# Patient Record
Sex: Female | Born: 1950
Health system: Southern US, Community
[De-identification: ages and names within clinical notes are randomized; demographics above are authoritative.]

## PROBLEM LIST (undated history)

## (undated) DIAGNOSIS — Z90710 Acquired absence of both cervix and uterus: Secondary | ICD-10-CM

## (undated) DIAGNOSIS — E785 Hyperlipidemia, unspecified: Secondary | ICD-10-CM

## (undated) DIAGNOSIS — K219 Gastro-esophageal reflux disease without esophagitis: Secondary | ICD-10-CM

## (undated) DIAGNOSIS — I6529 Occlusion and stenosis of unspecified carotid artery: Secondary | ICD-10-CM

## (undated) DIAGNOSIS — I10 Essential (primary) hypertension: Secondary | ICD-10-CM

## (undated) DIAGNOSIS — Z853 Personal history of malignant neoplasm of breast: Secondary | ICD-10-CM

## (undated) DIAGNOSIS — Z860100 Personal history of colon polyps, unspecified: Secondary | ICD-10-CM

## (undated) DIAGNOSIS — I517 Cardiomegaly: Secondary | ICD-10-CM

## (undated) DIAGNOSIS — R252 Cramp and spasm: Secondary | ICD-10-CM

## (undated) DIAGNOSIS — I350 Nonrheumatic aortic (valve) stenosis: Secondary | ICD-10-CM

## (undated) DIAGNOSIS — I422 Other hypertrophic cardiomyopathy: Secondary | ICD-10-CM

## (undated) DIAGNOSIS — I340 Nonrheumatic mitral (valve) insufficiency: Secondary | ICD-10-CM

## (undated) DIAGNOSIS — C50919 Malignant neoplasm of unspecified site of unspecified female breast: Secondary | ICD-10-CM

## (undated) DIAGNOSIS — R0683 Snoring: Secondary | ICD-10-CM

## (undated) DIAGNOSIS — Z9221 Personal history of antineoplastic chemotherapy: Secondary | ICD-10-CM

## (undated) DIAGNOSIS — Z923 Personal history of irradiation: Secondary | ICD-10-CM

## (undated) DIAGNOSIS — R011 Cardiac murmur, unspecified: Secondary | ICD-10-CM

## (undated) HISTORY — PX: COLONOSCOPY W/ POLYPECTOMY: SHX1380

## (undated) HISTORY — DX: Nonrheumatic aortic (valve) stenosis: I35.0

## (undated) HISTORY — DX: Cramp and spasm: R25.2

## (undated) HISTORY — DX: Acquired absence of both cervix and uterus: Z90.710

## (undated) HISTORY — PX: ABDOMINAL HYSTERECTOMY: SHX81

## (undated) HISTORY — DX: Other hypertrophic cardiomyopathy: I42.2

## (undated) HISTORY — DX: Cardiomegaly: I51.7

## (undated) HISTORY — DX: Personal history of colon polyps, unspecified: Z86.0100

## (undated) HISTORY — DX: Cardiac murmur, unspecified: R01.1

## (undated) HISTORY — DX: Personal history of malignant neoplasm of breast: Z85.3

## (undated) HISTORY — DX: Essential (primary) hypertension: I10

## (undated) HISTORY — DX: Hyperlipidemia, unspecified: E78.5

## (undated) HISTORY — PX: TUBAL LIGATION: SHX77

## (undated) HISTORY — DX: Snoring: R06.83

## (undated) HISTORY — DX: Gastro-esophageal reflux disease without esophagitis: K21.9

## (undated) HISTORY — DX: Occlusion and stenosis of unspecified carotid artery: I65.29

## (undated) HISTORY — DX: Personal history of colonic polyps: Z86.010

## (undated) HISTORY — DX: Nonrheumatic mitral (valve) insufficiency: I34.0

---

## 1998-03-24 ENCOUNTER — Other Ambulatory Visit: Admission: RE | Admit: 1998-03-24 | Discharge: 1998-03-24 | Payer: Self-pay | Admitting: Obstetrics and Gynecology

## 2000-06-09 ENCOUNTER — Ambulatory Visit (HOSPITAL_COMMUNITY): Admission: RE | Admit: 2000-06-09 | Discharge: 2000-06-09 | Payer: Self-pay | Admitting: Gastroenterology

## 2001-03-27 ENCOUNTER — Ambulatory Visit (HOSPITAL_COMMUNITY): Admission: RE | Admit: 2001-03-27 | Discharge: 2001-03-27 | Payer: Self-pay | Admitting: Internal Medicine

## 2001-03-27 ENCOUNTER — Encounter: Payer: Self-pay | Admitting: Internal Medicine

## 2001-06-02 ENCOUNTER — Encounter: Admission: RE | Admit: 2001-06-02 | Discharge: 2001-06-02 | Payer: Self-pay | Admitting: Obstetrics and Gynecology

## 2001-06-02 ENCOUNTER — Encounter: Payer: Self-pay | Admitting: Obstetrics and Gynecology

## 2002-01-08 ENCOUNTER — Encounter: Admission: RE | Admit: 2002-01-08 | Discharge: 2002-04-08 | Payer: Self-pay | Admitting: Internal Medicine

## 2003-08-15 ENCOUNTER — Encounter (INDEPENDENT_AMBULATORY_CARE_PROVIDER_SITE_OTHER): Payer: Self-pay | Admitting: *Deleted

## 2003-08-15 ENCOUNTER — Ambulatory Visit (HOSPITAL_COMMUNITY): Admission: RE | Admit: 2003-08-15 | Discharge: 2003-08-15 | Payer: Self-pay | Admitting: Gastroenterology

## 2004-03-03 ENCOUNTER — Encounter: Admission: RE | Admit: 2004-03-03 | Discharge: 2004-03-03 | Payer: Self-pay | Admitting: Gastroenterology

## 2004-10-11 DIAGNOSIS — Z9221 Personal history of antineoplastic chemotherapy: Secondary | ICD-10-CM

## 2004-10-11 DIAGNOSIS — Z923 Personal history of irradiation: Secondary | ICD-10-CM

## 2004-10-11 DIAGNOSIS — Z853 Personal history of malignant neoplasm of breast: Secondary | ICD-10-CM

## 2004-10-11 HISTORY — DX: Personal history of irradiation: Z92.3

## 2004-10-11 HISTORY — DX: Personal history of antineoplastic chemotherapy: Z92.21

## 2004-10-11 HISTORY — PX: BREAST LUMPECTOMY: SHX2

## 2004-10-11 HISTORY — DX: Personal history of malignant neoplasm of breast: Z85.3

## 2004-11-17 ENCOUNTER — Ambulatory Visit: Payer: Self-pay | Admitting: Internal Medicine

## 2005-04-22 ENCOUNTER — Ambulatory Visit (HOSPITAL_COMMUNITY): Admission: RE | Admit: 2005-04-22 | Discharge: 2005-04-22 | Payer: Self-pay | Admitting: Internal Medicine

## 2005-05-03 ENCOUNTER — Encounter (INDEPENDENT_AMBULATORY_CARE_PROVIDER_SITE_OTHER): Payer: Self-pay | Admitting: *Deleted

## 2005-05-03 ENCOUNTER — Encounter: Admission: RE | Admit: 2005-05-03 | Discharge: 2005-05-03 | Payer: Self-pay | Admitting: Internal Medicine

## 2005-05-03 ENCOUNTER — Encounter (INDEPENDENT_AMBULATORY_CARE_PROVIDER_SITE_OTHER): Payer: Self-pay | Admitting: Diagnostic Radiology

## 2005-05-06 ENCOUNTER — Ambulatory Visit (HOSPITAL_COMMUNITY): Admission: RE | Admit: 2005-05-06 | Discharge: 2005-05-06 | Payer: Self-pay | Admitting: General Surgery

## 2005-05-25 ENCOUNTER — Encounter: Admission: RE | Admit: 2005-05-25 | Discharge: 2005-05-25 | Payer: Self-pay | Admitting: General Surgery

## 2005-05-31 ENCOUNTER — Ambulatory Visit (HOSPITAL_BASED_OUTPATIENT_CLINIC_OR_DEPARTMENT_OTHER): Admission: RE | Admit: 2005-05-31 | Discharge: 2005-05-31 | Payer: Self-pay | Admitting: General Surgery

## 2005-05-31 ENCOUNTER — Encounter (INDEPENDENT_AMBULATORY_CARE_PROVIDER_SITE_OTHER): Payer: Self-pay | Admitting: *Deleted

## 2005-05-31 ENCOUNTER — Encounter: Admission: RE | Admit: 2005-05-31 | Discharge: 2005-05-31 | Payer: Self-pay | Admitting: General Surgery

## 2005-05-31 ENCOUNTER — Ambulatory Visit (HOSPITAL_COMMUNITY): Admission: RE | Admit: 2005-05-31 | Discharge: 2005-05-31 | Payer: Self-pay | Admitting: General Surgery

## 2005-06-01 ENCOUNTER — Ambulatory Visit: Payer: Self-pay | Admitting: Oncology

## 2005-06-10 ENCOUNTER — Ambulatory Visit: Admission: RE | Admit: 2005-06-10 | Discharge: 2005-07-08 | Payer: Self-pay | Admitting: Radiation Oncology

## 2005-06-11 ENCOUNTER — Ambulatory Visit: Payer: Self-pay | Admitting: Internal Medicine

## 2005-06-21 ENCOUNTER — Ambulatory Visit (HOSPITAL_COMMUNITY): Admission: RE | Admit: 2005-06-21 | Discharge: 2005-06-21 | Payer: Self-pay | Admitting: Oncology

## 2005-06-21 ENCOUNTER — Ambulatory Visit (HOSPITAL_BASED_OUTPATIENT_CLINIC_OR_DEPARTMENT_OTHER): Admission: RE | Admit: 2005-06-21 | Discharge: 2005-06-21 | Payer: Self-pay | Admitting: General Surgery

## 2005-06-22 ENCOUNTER — Ambulatory Visit (HOSPITAL_COMMUNITY): Admission: RE | Admit: 2005-06-22 | Discharge: 2005-06-22 | Payer: Self-pay | Admitting: Oncology

## 2005-06-29 ENCOUNTER — Encounter (INDEPENDENT_AMBULATORY_CARE_PROVIDER_SITE_OTHER): Payer: Self-pay | Admitting: Cardiology

## 2005-06-29 ENCOUNTER — Ambulatory Visit: Admission: RE | Admit: 2005-06-29 | Discharge: 2005-06-29 | Payer: Self-pay | Admitting: Oncology

## 2005-07-22 ENCOUNTER — Ambulatory Visit: Payer: Self-pay | Admitting: Oncology

## 2005-08-24 ENCOUNTER — Ambulatory Visit: Admission: RE | Admit: 2005-08-24 | Discharge: 2005-11-19 | Payer: Self-pay | Admitting: Radiation Oncology

## 2005-09-06 ENCOUNTER — Encounter: Payer: Self-pay | Admitting: Radiation Oncology

## 2005-09-15 ENCOUNTER — Ambulatory Visit (HOSPITAL_BASED_OUTPATIENT_CLINIC_OR_DEPARTMENT_OTHER): Admission: RE | Admit: 2005-09-15 | Discharge: 2005-09-15 | Payer: Self-pay | Admitting: General Surgery

## 2005-11-01 ENCOUNTER — Ambulatory Visit: Payer: Self-pay | Admitting: Oncology

## 2006-01-03 ENCOUNTER — Ambulatory Visit: Payer: Self-pay | Admitting: Oncology

## 2006-04-06 ENCOUNTER — Ambulatory Visit: Payer: Self-pay | Admitting: Internal Medicine

## 2006-04-06 ENCOUNTER — Inpatient Hospital Stay (HOSPITAL_COMMUNITY): Admission: AD | Admit: 2006-04-06 | Discharge: 2006-04-07 | Payer: Self-pay | Admitting: Internal Medicine

## 2006-04-18 ENCOUNTER — Ambulatory Visit (HOSPITAL_COMMUNITY): Admission: RE | Admit: 2006-04-18 | Discharge: 2006-04-18 | Payer: Self-pay | Admitting: Gastroenterology

## 2006-04-25 ENCOUNTER — Encounter: Admission: RE | Admit: 2006-04-25 | Discharge: 2006-04-25 | Payer: Self-pay | Admitting: Internal Medicine

## 2006-05-13 ENCOUNTER — Emergency Department (HOSPITAL_COMMUNITY): Admission: EM | Admit: 2006-05-13 | Discharge: 2006-05-13 | Payer: Self-pay | Admitting: Family Medicine

## 2006-05-29 ENCOUNTER — Ambulatory Visit: Payer: Self-pay | Admitting: Oncology

## 2006-11-14 ENCOUNTER — Ambulatory Visit: Payer: Self-pay | Admitting: Internal Medicine

## 2006-11-14 ENCOUNTER — Ambulatory Visit: Payer: Self-pay | Admitting: Oncology

## 2006-11-17 LAB — CBC WITH DIFFERENTIAL/PLATELET
Eosinophils Absolute: 0.1 10*3/uL (ref 0.0–0.5)
HCT: 33.9 % — ABNORMAL LOW (ref 34.8–46.6)
LYMPH%: 22.5 % (ref 14.0–48.0)
MCHC: 34.4 g/dL (ref 32.0–36.0)
MCV: 89.1 fL (ref 81.0–101.0)
MONO%: 9.7 % (ref 0.0–13.0)
NEUT#: 3.9 10*3/uL (ref 1.5–6.5)
NEUT%: 65.4 % (ref 39.6–76.8)
Platelets: 266 10*3/uL (ref 145–400)
RBC: 3.81 10*6/uL (ref 3.70–5.32)

## 2006-11-17 LAB — COMPREHENSIVE METABOLIC PANEL
Alkaline Phosphatase: 49 U/L (ref 39–117)
Creatinine, Ser: 0.88 mg/dL (ref 0.40–1.20)
Glucose, Bld: 97 mg/dL (ref 70–99)
Sodium: 142 mEq/L (ref 135–145)
Total Bilirubin: 0.3 mg/dL (ref 0.3–1.2)
Total Protein: 6.8 g/dL (ref 6.0–8.3)

## 2006-11-17 LAB — T4, FREE: Free T4: 0.95 ng/dL (ref 0.89–1.80)

## 2006-11-17 LAB — TSH: TSH: 1.43 u[IU]/mL (ref 0.350–5.500)

## 2006-11-17 LAB — CANCER ANTIGEN 27.29: CA 27.29: 20 U/mL (ref 0–39)

## 2006-11-24 ENCOUNTER — Ambulatory Visit: Payer: Self-pay | Admitting: Internal Medicine

## 2007-01-04 ENCOUNTER — Encounter: Admission: RE | Admit: 2007-01-04 | Discharge: 2007-01-04 | Payer: Self-pay | Admitting: General Surgery

## 2007-02-22 ENCOUNTER — Ambulatory Visit (HOSPITAL_COMMUNITY): Admission: RE | Admit: 2007-02-22 | Discharge: 2007-02-22 | Payer: Self-pay | Admitting: Cardiology

## 2007-03-02 ENCOUNTER — Ambulatory Visit (HOSPITAL_COMMUNITY): Admission: RE | Admit: 2007-03-02 | Discharge: 2007-03-02 | Payer: Self-pay | Admitting: Cardiology

## 2007-03-07 ENCOUNTER — Ambulatory Visit (HOSPITAL_COMMUNITY): Admission: RE | Admit: 2007-03-07 | Discharge: 2007-03-07 | Payer: Self-pay | Admitting: Cardiology

## 2007-04-13 ENCOUNTER — Encounter: Payer: Self-pay | Admitting: Internal Medicine

## 2007-04-24 ENCOUNTER — Ambulatory Visit (HOSPITAL_COMMUNITY): Admission: RE | Admit: 2007-04-24 | Discharge: 2007-04-24 | Payer: Self-pay | Admitting: Family Medicine

## 2007-05-03 ENCOUNTER — Ambulatory Visit (HOSPITAL_COMMUNITY): Admission: RE | Admit: 2007-05-03 | Discharge: 2007-05-03 | Payer: Self-pay | Admitting: Oncology

## 2007-05-05 ENCOUNTER — Encounter: Admission: RE | Admit: 2007-05-05 | Discharge: 2007-05-05 | Payer: Self-pay | Admitting: Oncology

## 2007-06-06 ENCOUNTER — Ambulatory Visit: Payer: Self-pay | Admitting: Oncology

## 2007-06-08 ENCOUNTER — Encounter: Payer: Self-pay | Admitting: Internal Medicine

## 2007-06-08 LAB — CBC WITH DIFFERENTIAL/PLATELET
BASO%: 0.5 % (ref 0.0–2.0)
EOS%: 3.4 % (ref 0.0–7.0)
HGB: 11.3 g/dL — ABNORMAL LOW (ref 11.6–15.9)
MCH: 30.7 pg (ref 26.0–34.0)
MCHC: 34.2 g/dL (ref 32.0–36.0)
MCV: 89.9 fL (ref 81.0–101.0)
MONO%: 6.7 % (ref 0.0–13.0)
RBC: 3.67 10*6/uL — ABNORMAL LOW (ref 3.70–5.32)
RDW: 15.2 % — ABNORMAL HIGH (ref 11.3–14.5)
lymph#: 1.6 10*3/uL (ref 0.9–3.3)

## 2007-06-08 LAB — COMPREHENSIVE METABOLIC PANEL
ALT: 13 U/L (ref 0–35)
AST: 14 U/L (ref 0–37)
Albumin: 4 g/dL (ref 3.5–5.2)
Alkaline Phosphatase: 44 U/L (ref 39–117)
Calcium: 8.8 mg/dL (ref 8.4–10.5)
Chloride: 109 mEq/L (ref 96–112)
Potassium: 3.7 mEq/L (ref 3.5–5.3)
Sodium: 145 mEq/L (ref 135–145)

## 2007-06-08 LAB — CANCER ANTIGEN 27.29: CA 27.29: 28 U/mL (ref 0–39)

## 2007-11-01 ENCOUNTER — Encounter: Payer: Self-pay | Admitting: Internal Medicine

## 2007-11-01 ENCOUNTER — Ambulatory Visit (HOSPITAL_COMMUNITY): Admission: RE | Admit: 2007-11-01 | Discharge: 2007-11-01 | Payer: Self-pay | Admitting: Gastroenterology

## 2007-11-16 ENCOUNTER — Telehealth (INDEPENDENT_AMBULATORY_CARE_PROVIDER_SITE_OTHER): Payer: Self-pay | Admitting: *Deleted

## 2007-11-28 ENCOUNTER — Ambulatory Visit: Payer: Self-pay | Admitting: Oncology

## 2007-11-29 LAB — CBC WITH DIFFERENTIAL/PLATELET
EOS%: 3.9 % (ref 0.0–7.0)
MCH: 30.5 pg (ref 26.0–34.0)
MCHC: 33.4 g/dL (ref 32.0–36.0)
MCV: 91.2 fL (ref 81.0–101.0)
MONO%: 8.8 % (ref 0.0–13.0)
RBC: 4 10*6/uL (ref 3.70–5.32)
RDW: UNDETERMINED % (ref 11.3–14.5)

## 2007-11-29 LAB — COMPREHENSIVE METABOLIC PANEL
AST: 14 U/L (ref 0–37)
Albumin: 3.9 g/dL (ref 3.5–5.2)
Alkaline Phosphatase: 55 U/L (ref 39–117)
Potassium: 4.6 mEq/L (ref 3.5–5.3)
Sodium: 143 mEq/L (ref 135–145)
Total Protein: 7 g/dL (ref 6.0–8.3)

## 2007-12-01 ENCOUNTER — Emergency Department (HOSPITAL_COMMUNITY): Admission: EM | Admit: 2007-12-01 | Discharge: 2007-12-01 | Payer: Self-pay | Admitting: Emergency Medicine

## 2007-12-02 ENCOUNTER — Emergency Department (HOSPITAL_COMMUNITY): Admission: EM | Admit: 2007-12-02 | Discharge: 2007-12-02 | Payer: Self-pay | Admitting: Family Medicine

## 2007-12-07 ENCOUNTER — Encounter: Payer: Self-pay | Admitting: Internal Medicine

## 2008-02-16 ENCOUNTER — Telehealth (INDEPENDENT_AMBULATORY_CARE_PROVIDER_SITE_OTHER): Payer: Self-pay | Admitting: *Deleted

## 2008-03-05 ENCOUNTER — Telehealth (INDEPENDENT_AMBULATORY_CARE_PROVIDER_SITE_OTHER): Payer: Self-pay | Admitting: *Deleted

## 2008-03-19 ENCOUNTER — Ambulatory Visit: Payer: Self-pay | Admitting: Internal Medicine

## 2008-03-19 DIAGNOSIS — K219 Gastro-esophageal reflux disease without esophagitis: Secondary | ICD-10-CM | POA: Insufficient documentation

## 2008-03-19 DIAGNOSIS — Z853 Personal history of malignant neoplasm of breast: Secondary | ICD-10-CM | POA: Insufficient documentation

## 2008-03-19 DIAGNOSIS — R5383 Other fatigue: Secondary | ICD-10-CM

## 2008-03-19 DIAGNOSIS — R5381 Other malaise: Secondary | ICD-10-CM | POA: Insufficient documentation

## 2008-03-20 ENCOUNTER — Telehealth (INDEPENDENT_AMBULATORY_CARE_PROVIDER_SITE_OTHER): Payer: Self-pay | Admitting: *Deleted

## 2008-03-21 ENCOUNTER — Encounter (INDEPENDENT_AMBULATORY_CARE_PROVIDER_SITE_OTHER): Payer: Self-pay | Admitting: *Deleted

## 2008-04-18 ENCOUNTER — Encounter: Payer: Self-pay | Admitting: Internal Medicine

## 2008-04-29 ENCOUNTER — Telehealth (INDEPENDENT_AMBULATORY_CARE_PROVIDER_SITE_OTHER): Payer: Self-pay | Admitting: *Deleted

## 2008-05-06 ENCOUNTER — Encounter: Admission: RE | Admit: 2008-05-06 | Discharge: 2008-05-06 | Payer: Self-pay | Admitting: General Surgery

## 2008-05-08 ENCOUNTER — Encounter: Payer: Self-pay | Admitting: Internal Medicine

## 2008-05-10 ENCOUNTER — Telehealth (INDEPENDENT_AMBULATORY_CARE_PROVIDER_SITE_OTHER): Payer: Self-pay | Admitting: *Deleted

## 2008-05-13 ENCOUNTER — Encounter: Payer: Self-pay | Admitting: Internal Medicine

## 2008-05-16 ENCOUNTER — Ambulatory Visit: Payer: Self-pay | Admitting: Internal Medicine

## 2008-05-25 LAB — CONVERTED CEMR LAB
Albumin: 3.4 g/dL — ABNORMAL LOW (ref 3.5–5.2)
BUN: 10 mg/dL (ref 6–23)
Basophils Relative: 0.7 % (ref 0.0–3.0)
Creatinine, Ser: 0.8 mg/dL (ref 0.4–1.2)
Eosinophils Absolute: 0.1 10*3/uL (ref 0.0–0.7)
Eosinophils Relative: 2.4 % (ref 0.0–5.0)
GFR calc Af Amer: 96 mL/min
GFR calc non Af Amer: 79 mL/min
HCT: 34.6 % — ABNORMAL LOW (ref 36.0–46.0)
HDL: 53.6 mg/dL (ref >39.0)
MCV: 90.4 fL (ref 78.0–100.0)
Monocytes Absolute: 0.4 10*3/uL (ref 0.1–1.0)
RBC: 3.83 M/uL — ABNORMAL LOW (ref 3.87–5.11)
TSH: 1.12 microintl units/mL (ref 0.35–5.50)
WBC: 4.8 10*3/uL (ref 4.5–10.5)

## 2008-05-26 ENCOUNTER — Ambulatory Visit: Payer: Self-pay | Admitting: Oncology

## 2008-05-27 ENCOUNTER — Encounter (INDEPENDENT_AMBULATORY_CARE_PROVIDER_SITE_OTHER): Payer: Self-pay | Admitting: *Deleted

## 2008-05-29 ENCOUNTER — Telehealth (INDEPENDENT_AMBULATORY_CARE_PROVIDER_SITE_OTHER): Payer: Self-pay | Admitting: *Deleted

## 2008-05-29 LAB — CBC WITH DIFFERENTIAL/PLATELET
Basophils Absolute: 0 10*3/uL (ref 0.0–0.1)
EOS%: 2.3 % (ref 0.0–7.0)
HGB: 12 g/dL (ref 11.6–15.9)
MCH: 30.4 pg (ref 26.0–34.0)
MCV: 90.1 fL (ref 81.0–101.0)
MONO%: 8.8 % (ref 0.0–13.0)
RBC: 3.95 10*6/uL (ref 3.70–5.32)
RDW: 14.9 % — ABNORMAL HIGH (ref 11.3–14.5)

## 2008-05-29 LAB — COMPREHENSIVE METABOLIC PANEL
Alkaline Phosphatase: 49 U/L (ref 39–117)
Creatinine, Ser: 0.86 mg/dL (ref 0.40–1.20)
Glucose, Bld: 87 mg/dL (ref 70–99)
Sodium: 141 mEq/L (ref 135–145)
Total Bilirubin: 0.4 mg/dL (ref 0.3–1.2)
Total Protein: 7.1 g/dL (ref 6.0–8.3)

## 2008-06-05 ENCOUNTER — Encounter: Payer: Self-pay | Admitting: Internal Medicine

## 2008-06-06 ENCOUNTER — Telehealth: Payer: Self-pay | Admitting: Internal Medicine

## 2008-06-10 ENCOUNTER — Ambulatory Visit: Payer: Self-pay | Admitting: Internal Medicine

## 2008-06-14 ENCOUNTER — Telehealth: Payer: Self-pay | Admitting: Internal Medicine

## 2008-06-18 LAB — CONVERTED CEMR LAB
Hemoglobin: 11.5 g/dL — ABNORMAL LOW (ref 12.0–15.0)
Iron: 69 ug/dL (ref 42–145)
Lymphocytes Relative: 29.9 % (ref 12.0–46.0)
Monocytes Relative: 9.9 % (ref 3.0–12.0)
Neutro Abs: 2.9 10*3/uL (ref 1.4–7.7)
RBC: 3.74 M/uL — ABNORMAL LOW (ref 3.87–5.11)
RDW: 14.1 % (ref 11.5–14.6)
Saturation Ratios: 19.2 % — ABNORMAL LOW (ref 20.0–50.0)
Transferrin: 257.3 mg/dL (ref >=212.0)
Vitamin B-12: 402 pg/mL (ref 211–911)

## 2008-07-22 ENCOUNTER — Ambulatory Visit: Payer: Self-pay | Admitting: Oncology

## 2008-10-16 ENCOUNTER — Telehealth (INDEPENDENT_AMBULATORY_CARE_PROVIDER_SITE_OTHER): Payer: Self-pay | Admitting: *Deleted

## 2008-11-05 ENCOUNTER — Ambulatory Visit: Payer: Self-pay | Admitting: Internal Medicine

## 2008-11-25 ENCOUNTER — Ambulatory Visit: Payer: Self-pay | Admitting: Oncology

## 2008-11-27 ENCOUNTER — Encounter: Payer: Self-pay | Admitting: Internal Medicine

## 2008-11-27 LAB — COMPREHENSIVE METABOLIC PANEL
ALT: 14 U/L (ref 0–35)
CO2: 28 mEq/L (ref 19–32)
Calcium: 8.7 mg/dL (ref 8.4–10.5)
Chloride: 108 mEq/L (ref 96–112)
Potassium: 3.5 mEq/L (ref 3.5–5.3)
Sodium: 143 mEq/L (ref 135–145)
Total Protein: 6.6 g/dL (ref 6.0–8.3)

## 2008-11-27 LAB — CBC WITH DIFFERENTIAL/PLATELET
BASO%: 0.4 % (ref 0.0–2.0)
Basophils Absolute: 0 10*3/uL (ref 0.0–0.1)
EOS%: 2.3 % (ref 0.0–7.0)
MCH: 30.4 pg (ref 26.0–34.0)
MCHC: 33.5 g/dL (ref 32.0–36.0)
MCV: 90.8 fL (ref 81.0–101.0)
MONO%: 8.8 % (ref 0.0–13.0)
RBC: 3.76 10*6/uL (ref 3.70–5.32)
RDW: 14.9 % — ABNORMAL HIGH (ref 11.3–14.5)
lymph#: 1.6 10*3/uL (ref 0.9–3.3)

## 2008-11-27 LAB — CANCER ANTIGEN 27.29: CA 27.29: 23 U/mL (ref 0–39)

## 2008-12-04 ENCOUNTER — Encounter: Payer: Self-pay | Admitting: Internal Medicine

## 2008-12-12 ENCOUNTER — Ambulatory Visit (HOSPITAL_COMMUNITY): Admission: RE | Admit: 2008-12-12 | Discharge: 2008-12-12 | Payer: Self-pay | Admitting: Gastroenterology

## 2008-12-12 ENCOUNTER — Encounter: Payer: Self-pay | Admitting: Internal Medicine

## 2008-12-12 ENCOUNTER — Encounter (INDEPENDENT_AMBULATORY_CARE_PROVIDER_SITE_OTHER): Payer: Self-pay | Admitting: Gastroenterology

## 2008-12-20 ENCOUNTER — Telehealth (INDEPENDENT_AMBULATORY_CARE_PROVIDER_SITE_OTHER): Payer: Self-pay | Admitting: *Deleted

## 2008-12-23 LAB — FERRITIN: Ferritin: 36 ng/mL (ref 10–291)

## 2008-12-23 LAB — VITAMIN B12: Vitamin B-12: 388 pg/mL (ref 211–911)

## 2008-12-23 LAB — FOLATE: Folate: 17.4 ng/mL

## 2008-12-23 LAB — RESEARCH LABS

## 2009-02-13 ENCOUNTER — Ambulatory Visit (HOSPITAL_COMMUNITY): Admission: RE | Admit: 2009-02-13 | Discharge: 2009-02-13 | Payer: Self-pay | Admitting: Obstetrics and Gynecology

## 2009-03-03 ENCOUNTER — Encounter: Payer: Self-pay | Admitting: Oncology

## 2009-03-03 ENCOUNTER — Ambulatory Visit: Payer: Self-pay | Admitting: Vascular Surgery

## 2009-03-03 ENCOUNTER — Ambulatory Visit: Admission: RE | Admit: 2009-03-03 | Discharge: 2009-03-03 | Payer: Self-pay | Admitting: Oncology

## 2009-03-18 ENCOUNTER — Telehealth: Payer: Self-pay | Admitting: Internal Medicine

## 2009-05-07 ENCOUNTER — Encounter: Admission: RE | Admit: 2009-05-07 | Discharge: 2009-05-07 | Payer: Self-pay | Admitting: Oncology

## 2009-05-26 ENCOUNTER — Ambulatory Visit: Payer: Self-pay | Admitting: Oncology

## 2009-05-29 ENCOUNTER — Encounter: Payer: Self-pay | Admitting: Internal Medicine

## 2009-05-29 LAB — CBC WITH DIFFERENTIAL/PLATELET
Basophils Absolute: 0 10*3/uL (ref 0.0–0.1)
Eosinophils Absolute: 0.3 10*3/uL (ref 0.0–0.5)
HCT: 35.7 % (ref 34.8–46.6)
HGB: 12 g/dL (ref 11.6–15.9)
MCH: 30.4 pg (ref 25.1–34.0)
MONO#: 0.5 10*3/uL (ref 0.1–0.9)
NEUT#: 5.3 10*3/uL (ref 1.5–6.5)
NEUT%: 71.8 % (ref 38.4–76.8)
WBC: 7.4 10*3/uL (ref 3.9–10.3)
lymph#: 1.3 10*3/uL (ref 0.9–3.3)

## 2009-05-30 LAB — CANCER ANTIGEN 27.29: CA 27.29: 33 U/mL (ref 0–39)

## 2009-05-30 LAB — COMPREHENSIVE METABOLIC PANEL
BUN: 15 mg/dL (ref 6–23)
CO2: 22 mEq/L (ref 19–32)
Calcium: 9.1 mg/dL (ref 8.4–10.5)
Chloride: 104 mEq/L (ref 96–112)
Creatinine, Ser: 0.96 mg/dL (ref 0.40–1.20)
Glucose, Bld: 88 mg/dL (ref 70–99)
Total Bilirubin: 0.3 mg/dL (ref 0.3–1.2)

## 2009-05-30 LAB — LACTATE DEHYDROGENASE: LDH: 187 U/L (ref 94–250)

## 2009-05-30 LAB — VITAMIN D 25 HYDROXY (VIT D DEFICIENCY, FRACTURES): Vit D, 25-Hydroxy: 15 ng/mL — ABNORMAL LOW (ref 30–89)

## 2009-06-04 ENCOUNTER — Encounter: Payer: Self-pay | Admitting: Internal Medicine

## 2009-06-04 LAB — URINALYSIS, MICROSCOPIC - CHCC
Bilirubin (Urine): NEGATIVE
Blood: NEGATIVE
Glucose: NEGATIVE g/dL
Nitrite: NEGATIVE

## 2009-06-06 ENCOUNTER — Telehealth (INDEPENDENT_AMBULATORY_CARE_PROVIDER_SITE_OTHER): Payer: Self-pay | Admitting: *Deleted

## 2009-06-17 ENCOUNTER — Ambulatory Visit: Payer: Self-pay | Admitting: Internal Medicine

## 2009-06-17 DIAGNOSIS — E559 Vitamin D deficiency, unspecified: Secondary | ICD-10-CM | POA: Insufficient documentation

## 2009-06-18 ENCOUNTER — Encounter: Payer: Self-pay | Admitting: Internal Medicine

## 2009-06-20 ENCOUNTER — Encounter (INDEPENDENT_AMBULATORY_CARE_PROVIDER_SITE_OTHER): Payer: Self-pay | Admitting: *Deleted

## 2009-06-22 LAB — CONVERTED CEMR LAB
HDL: 53.4 mg/dL (ref >39.00)
Hgb A1c MFr Bld: 6.2 % (ref 4.6–6.5)

## 2009-06-23 ENCOUNTER — Encounter (INDEPENDENT_AMBULATORY_CARE_PROVIDER_SITE_OTHER): Payer: Self-pay | Admitting: *Deleted

## 2009-06-27 ENCOUNTER — Telehealth: Payer: Self-pay | Admitting: Internal Medicine

## 2009-10-16 ENCOUNTER — Telehealth (INDEPENDENT_AMBULATORY_CARE_PROVIDER_SITE_OTHER): Payer: Self-pay | Admitting: *Deleted

## 2009-10-27 ENCOUNTER — Telehealth (INDEPENDENT_AMBULATORY_CARE_PROVIDER_SITE_OTHER): Payer: Self-pay | Admitting: *Deleted

## 2009-11-03 ENCOUNTER — Telehealth (INDEPENDENT_AMBULATORY_CARE_PROVIDER_SITE_OTHER): Payer: Self-pay | Admitting: *Deleted

## 2010-01-05 ENCOUNTER — Ambulatory Visit: Payer: Self-pay | Admitting: Internal Medicine

## 2010-01-05 DIAGNOSIS — R9431 Abnormal electrocardiogram [ECG] [EKG]: Secondary | ICD-10-CM | POA: Insufficient documentation

## 2010-01-05 DIAGNOSIS — M21619 Bunion of unspecified foot: Secondary | ICD-10-CM | POA: Insufficient documentation

## 2010-01-07 ENCOUNTER — Ambulatory Visit: Payer: Self-pay | Admitting: Internal Medicine

## 2010-01-07 DIAGNOSIS — Z860100 Personal history of colon polyps, unspecified: Secondary | ICD-10-CM | POA: Insufficient documentation

## 2010-01-07 DIAGNOSIS — I10 Essential (primary) hypertension: Secondary | ICD-10-CM

## 2010-01-07 DIAGNOSIS — E785 Hyperlipidemia, unspecified: Secondary | ICD-10-CM | POA: Insufficient documentation

## 2010-01-07 HISTORY — DX: Essential (primary) hypertension: I10

## 2010-01-08 ENCOUNTER — Telehealth (INDEPENDENT_AMBULATORY_CARE_PROVIDER_SITE_OTHER): Payer: Self-pay | Admitting: *Deleted

## 2010-01-08 LAB — CONVERTED CEMR LAB
ALT: 17 units/L (ref 0–35)
Albumin: 3.6 g/dL (ref 3.5–5.2)
Basophils Relative: 0.6 % (ref 0.0–3.0)
CO2: 31 meq/L (ref 19–32)
Chloride: 107 meq/L (ref 96–112)
Cholesterol: 124 mg/dL (ref 0–200)
Creatinine, Ser: 0.9 mg/dL (ref 0.4–1.2)
Eosinophils Absolute: 0.3 10*3/uL (ref 0.0–0.7)
Eosinophils Relative: 5.5 % — ABNORMAL HIGH (ref 0.0–5.0)
HCT: 36 % (ref 36.0–46.0)
Hemoglobin: 11.9 g/dL — ABNORMAL LOW (ref 12.0–15.0)
Hgb A1c MFr Bld: 6.1 % (ref 4.6–6.5)
Ketones, ur: NEGATIVE mg/dL
Leukocytes, UA: NEGATIVE
Lymphs Abs: 1.6 10*3/uL (ref 0.7–4.0)
MCHC: 32.9 g/dL (ref 30.0–36.0)
MCV: 93.2 fL (ref 78.0–100.0)
Monocytes Absolute: 0.5 10*3/uL (ref 0.1–1.0)
Neutro Abs: 2.7 10*3/uL (ref 1.4–7.7)
Potassium: 3.6 meq/L (ref 3.5–5.1)
RBC: 3.86 M/uL — ABNORMAL LOW (ref 3.87–5.11)
Sodium: 144 meq/L (ref 135–145)
Specific Gravity, Urine: >=1.03 (ref 1.000–1.030)
Total Protein: 6.6 g/dL (ref 6.0–8.3)
Triglycerides: 57 mg/dL (ref 0.0–149.0)
Urine Glucose: NEGATIVE mg/dL
pH: 5 (ref 5.0–8.0)

## 2010-01-12 ENCOUNTER — Encounter (HOSPITAL_COMMUNITY): Admission: RE | Admit: 2010-01-12 | Discharge: 2010-03-11 | Payer: Self-pay | Admitting: Internal Medicine

## 2010-01-12 ENCOUNTER — Encounter: Payer: Self-pay | Admitting: Cardiology

## 2010-01-12 ENCOUNTER — Ambulatory Visit: Payer: Self-pay

## 2010-01-12 ENCOUNTER — Ambulatory Visit: Payer: Self-pay | Admitting: Internal Medicine

## 2010-01-12 ENCOUNTER — Telehealth: Payer: Self-pay | Admitting: Internal Medicine

## 2010-01-13 ENCOUNTER — Telehealth (INDEPENDENT_AMBULATORY_CARE_PROVIDER_SITE_OTHER): Payer: Self-pay | Admitting: *Deleted

## 2010-01-13 ENCOUNTER — Telehealth: Payer: Self-pay | Admitting: Internal Medicine

## 2010-01-13 ENCOUNTER — Ambulatory Visit (HOSPITAL_BASED_OUTPATIENT_CLINIC_OR_DEPARTMENT_OTHER): Admission: RE | Admit: 2010-01-13 | Discharge: 2010-01-13 | Payer: Self-pay | Admitting: Podiatry

## 2010-03-03 ENCOUNTER — Telehealth (INDEPENDENT_AMBULATORY_CARE_PROVIDER_SITE_OTHER): Payer: Self-pay | Admitting: *Deleted

## 2010-05-22 ENCOUNTER — Encounter: Admission: RE | Admit: 2010-05-22 | Discharge: 2010-05-22 | Payer: Self-pay | Admitting: Oncology

## 2010-05-28 ENCOUNTER — Ambulatory Visit: Payer: Self-pay | Admitting: Oncology

## 2010-06-01 LAB — COMPREHENSIVE METABOLIC PANEL
ALT: 19 U/L (ref 0–35)
AST: 18 U/L (ref 0–37)
Albumin: 4.1 g/dL (ref 3.5–5.2)
CO2: 29 mEq/L (ref 19–32)
Calcium: 9.5 mg/dL (ref 8.4–10.5)
Chloride: 103 mEq/L (ref 96–112)
Creatinine, Ser: 1.02 mg/dL (ref 0.40–1.20)
Potassium: 3.5 mEq/L (ref 3.5–5.3)
Sodium: 141 mEq/L (ref 135–145)
Total Protein: 6.6 g/dL (ref 6.0–8.3)

## 2010-06-01 LAB — CBC WITH DIFFERENTIAL/PLATELET
BASO%: 0.4 % (ref 0.0–2.0)
EOS%: 1.6 % (ref 0.0–7.0)
HCT: 37.2 % (ref 34.8–46.6)
MCH: 30.3 pg (ref 25.1–34.0)
MCHC: 33.5 g/dL (ref 31.5–36.0)
MONO#: 0.5 10*3/uL (ref 0.1–0.9)
NEUT%: 62.6 % (ref 38.4–76.8)
RBC: 4.11 10*6/uL (ref 3.70–5.45)
RDW: 15.5 % — ABNORMAL HIGH (ref 11.2–14.5)
WBC: 5.8 10*3/uL (ref 3.9–10.3)
lymph#: 1.5 10*3/uL (ref 0.9–3.3)

## 2010-06-08 ENCOUNTER — Ambulatory Visit (HOSPITAL_COMMUNITY): Admission: RE | Admit: 2010-06-08 | Discharge: 2010-06-08 | Payer: Self-pay | Admitting: Oncology

## 2010-06-08 ENCOUNTER — Encounter: Payer: Self-pay | Admitting: Internal Medicine

## 2010-09-08 ENCOUNTER — Telehealth (INDEPENDENT_AMBULATORY_CARE_PROVIDER_SITE_OTHER): Payer: Self-pay | Admitting: *Deleted

## 2010-10-13 ENCOUNTER — Telehealth (INDEPENDENT_AMBULATORY_CARE_PROVIDER_SITE_OTHER): Payer: Self-pay | Admitting: *Deleted

## 2010-10-16 ENCOUNTER — Ambulatory Visit: Payer: Self-pay | Admitting: Oncology

## 2010-10-23 ENCOUNTER — Other Ambulatory Visit: Payer: Self-pay | Admitting: Internal Medicine

## 2010-10-23 ENCOUNTER — Ambulatory Visit
Admission: RE | Admit: 2010-10-23 | Discharge: 2010-10-23 | Payer: Self-pay | Source: Home / Self Care | Attending: Internal Medicine | Admitting: Internal Medicine

## 2010-10-23 ENCOUNTER — Encounter: Payer: Self-pay | Admitting: Internal Medicine

## 2010-10-23 DIAGNOSIS — R109 Unspecified abdominal pain: Secondary | ICD-10-CM | POA: Insufficient documentation

## 2010-10-23 DIAGNOSIS — M545 Low back pain, unspecified: Secondary | ICD-10-CM | POA: Insufficient documentation

## 2010-10-23 LAB — CBC WITH DIFFERENTIAL/PLATELET
Basophils Absolute: 0 10*3/uL (ref 0.0–0.1)
Basophils Relative: 0.1 % (ref 0.0–3.0)
Eosinophils Absolute: 0 10*3/uL (ref 0.0–0.7)
Eosinophils Relative: 0.2 % (ref 0.0–5.0)
HCT: 39.2 % (ref 36.0–46.0)
Hemoglobin: 13.1 g/dL (ref 12.0–15.0)
Lymphocytes Relative: 15.8 % (ref 12.0–46.0)
Lymphs Abs: 0.9 10*3/uL (ref 0.7–4.0)
MCHC: 33.5 g/dL (ref 30.0–36.0)
MCV: 89.6 fl (ref 78.0–100.0)
Monocytes Absolute: 0.4 10*3/uL (ref 0.1–1.0)
Monocytes Relative: 7.8 % (ref 3.0–12.0)
Neutro Abs: 4.2 10*3/uL (ref 1.4–7.7)
Neutrophils Relative %: 76.1 % (ref 43.0–77.0)
Platelets: 253 10*3/uL (ref 150.0–400.0)
RBC: 4.37 Mil/uL (ref 3.87–5.11)
RDW: 15.5 % — ABNORMAL HIGH (ref 11.5–14.6)
WBC: 5.6 10*3/uL (ref 4.5–10.5)

## 2010-10-23 LAB — CONVERTED CEMR LAB
Bilirubin Urine: NEGATIVE
Glucose, Urine, Semiquant: NEGATIVE
Protein, U semiquant: NEGATIVE
Specific Gravity, Urine: <1.005
WBC Urine, dipstick: NEGATIVE
pH: 6.5

## 2010-10-23 LAB — HEPATIC FUNCTION PANEL
ALT: 24 U/L (ref 0–35)
AST: 25 U/L (ref 0–37)
Albumin: 3.7 g/dL (ref 3.5–5.2)
Alkaline Phosphatase: 57 U/L (ref 39–117)
Bilirubin, Direct: 0.1 mg/dL (ref 0.0–0.3)
Total Bilirubin: 0.5 mg/dL (ref 0.3–1.2)
Total Protein: 7.1 g/dL (ref 6.0–8.3)

## 2010-10-23 LAB — AMYLASE: Amylase: 77 U/L (ref 27–131)

## 2010-10-23 LAB — LIPASE: Lipase: 25 U/L (ref 11.0–59.0)

## 2010-10-26 ENCOUNTER — Telehealth: Payer: Self-pay | Admitting: Internal Medicine

## 2010-10-27 ENCOUNTER — Encounter: Payer: Self-pay | Admitting: Internal Medicine

## 2010-10-27 LAB — CBC WITH DIFFERENTIAL/PLATELET
BASO%: 0.4 % (ref 0.0–2.0)
Basophils Absolute: 0 10*3/uL (ref 0.0–0.1)
EOS%: 0.5 % (ref 0.0–7.0)
Eosinophils Absolute: 0 10*3/uL (ref 0.0–0.5)
HCT: 38.5 % (ref 34.8–46.6)
HGB: 12.7 g/dL (ref 11.6–15.9)
LYMPH%: 23.1 % (ref 14.0–49.7)
MCH: 29.3 pg (ref 25.1–34.0)
MCHC: 32.9 g/dL (ref 31.5–36.0)
MCV: 89.1 fL (ref 79.5–101.0)
MONO#: 0.3 10*3/uL (ref 0.1–0.9)
MONO%: 6.5 % (ref 0.0–14.0)
NEUT#: 3.6 10*3/uL (ref 1.5–6.5)
NEUT%: 69.5 % (ref 38.4–76.8)
Platelets: 270 10*3/uL (ref 145–400)
RBC: 4.33 10*6/uL (ref 3.70–5.45)
RDW: 15.1 % — ABNORMAL HIGH (ref 11.2–14.5)
WBC: 5.2 10*3/uL (ref 3.9–10.3)
lymph#: 1.2 10*3/uL (ref 0.9–3.3)

## 2010-10-27 LAB — CANCER ANTIGEN 27.29: CA 27.29: 29 U/mL (ref 0–39)

## 2010-10-27 LAB — COMPREHENSIVE METABOLIC PANEL
ALT: 28 U/L (ref 0–35)
AST: 31 U/L (ref 0–37)
Albumin: 4 g/dL (ref 3.5–5.2)
Alkaline Phosphatase: 49 U/L (ref 39–117)
BUN: 13 mg/dL (ref 6–23)
CO2: 31 mEq/L (ref 19–32)
Calcium: 9.1 mg/dL (ref 8.4–10.5)
Chloride: 103 mEq/L (ref 96–112)
Creatinine, Ser: 0.93 mg/dL (ref 0.40–1.20)
Glucose, Bld: 117 mg/dL — ABNORMAL HIGH (ref 70–99)
Potassium: 3.1 mEq/L — ABNORMAL LOW (ref 3.5–5.3)
Sodium: 143 mEq/L (ref 135–145)
Total Bilirubin: 0.3 mg/dL (ref 0.3–1.2)
Total Protein: 6.9 g/dL (ref 6.0–8.3)

## 2010-11-01 ENCOUNTER — Encounter: Payer: Self-pay | Admitting: Gastroenterology

## 2010-11-01 ENCOUNTER — Encounter: Payer: Self-pay | Admitting: Oncology

## 2010-11-01 ENCOUNTER — Encounter: Payer: Self-pay | Admitting: Internal Medicine

## 2010-11-10 NOTE — Progress Notes (Signed)
  Phone Note Call from Patient Call back at Home Phone [phone]   Caller: Patient Summary of Call: Patient called to say her surgery is suppose to be today and she needs to know if she is cleared or not.  I informed patient that the clearance was left in the hands of the Cardiologist-Per Dr.Hopper. I spoke with someone in cardiology yesterday and gave them the fax and phone number for the surgery center and was told they would contact them. Patient was given the number to cardiology./[facility]  January 13, 2010 8:50 AM

## 2010-11-10 NOTE — Progress Notes (Signed)
  Pt dropped of FMLA papers for completion,sent to [employer]. Laura Poole  September 08, 2010 2:02 PM

## 2010-11-10 NOTE — Progress Notes (Signed)
Summary: Vit D Concerns   Phone Note Outgoing Call Call back at Home Phone [phone]   Call placed by: Shonna Chock,  Mar 03, 2010 9:58 AM Call placed to: Patient Summary of Call: I called patient to discuss her dose of Vit D, patient said she is taking plain Vit D 1000 international units daily.  I informed patient per Dr.Hopper she is suppose to be on D3 2000 international units daily. I will send in a new RX for the correct dose Initial call taken by: Shonna Chock,  Mar 03, 2010 10:15 AM    Prescriptions: VITAMIN D3 2000 UNIT CAPS (CHOLECALCIFEROL) 1 by mouth once daily  #30 x 11   Entered by:   Shonna Chock   Authorized by:   Marga Melnick MD   Signed by:   Shonna Chock on 03/03/2010   Method used:   Electronically to        [facility]* (retail)       [address]       [address] [place] [address]ing       [place], [place]  [address]       Ph: [phone]       Fax: [phone]   RxID:   [id number]

## 2010-11-10 NOTE — Progress Notes (Signed)
Summary: Vit D Concerns  Phone Note From Pharmacy   Caller: [facility] Summary of Call: Message left on VM Silvio Pate from the pharmacy. Vit D rx was written incorrectly Vit D 50,000 ONLY comes as D2 not D3. Would the Dr.Like to RX D2?    Per Dr.hopper have patient take D3 OTC 2000iu daily and recheck in 6 months.  I called [facility] and had her void RX for Vit D3 50,000. I called the patient and left message on her VM informing her of the change in Vit D./[facility]rae [facility]  November 03, 2009 5:14 PM       New/Updated Medications: VITAMIN D3 2000 UNIT CAPS (CHOLECALCIFEROL) 1 by mouth once daily

## 2010-11-10 NOTE — Assessment & Plan Note (Signed)
Summary: Cardiology Nuclear Study  Nuclear Med Background Indications for Stress Test: Evaluation for Ischemia, Surgical Clearance, Abnormal EKG  Indications Comments: Pending foot surgery  History: Echo, History of Chemo, Myocardial Perfusion Study, MVP  History Comments: '08 Echo: NL LVF, mod. LVH, MVP '08 MPS: EF=69%, anterior scar, peri-infarct ischemia  Symptoms: Chest Pressure, Chest Tightness, Palpitations    Nuclear Pre-Procedure Cardiac Risk Factors: Family History - CAD, Hypertension, Lipids Caffeine/Decaff Intake: None NPO After: 9:00 PM Lungs: clear IV 0.9% NS with Angio Cath: 22g     IV Site: (R) Wrist IV Started by: Irean Hong RN Chest Size (in) 40     Cup Size DD     Height (in): 67.75 Weight (lb): 165 BMI: 25.37 Tech Comments: The patient to have feet surgery tomorrow, and unable to walk treadmill per patient, changed to Lexiscan. Patsy Edwards,RN.  Nuclear Med Study 1 or 2 day study:  1 day     Stress Test Type:  Eugenie Birks Reading MD:  Dietrich Pates, MD     Referring MD:  Marga Melnick Resting Radionuclide:  Technetium 24m Tetrofosmin     Resting Radionuclide Dose:  11 mCi  Stress Radionuclide:  Technetium 66m Tetrofosmin     Stress Radionuclide Dose:  33 mCi   Stress Protocol      Predicted Max HR:  164 bpm   Lexiscan: 0.4 mg   Stress Test Technologist:  Milana Na EMT-P     Nuclear Technologist:  Burna Mortimer Deal RT-N  Rest Procedure  Myocardial perfusion imaging was performed at rest 45 minutes following the intravenous administration of Myoview Technetium 35m Tetrofosmin.  Stress Procedure  The patient received IV Lexiscan 0.4 mg over 15-seconds.  Myoview injected at 30-seconds.  There were no significant changes, sob, chest tightness(in late recovery), and rare pvcs  with infusion.  Quantitative spect images were obtained after a 45 minute delay.  QPS Raw Data Images:  Images were motion corrected.  Soft tissue (diaphragm, bowel activity) underlie  heart; breast tissue overlies heart. Stress Images:  Apical defect.   Otherwise normal perfusion. Rest Images:  No change from the stress images. Subtraction (SDS):  No evidence of ischemia. Transient Ischemic Dilatation:  .07  (Normal <1.22)  Lung/Heart Ratio:  .26  (Normal <0.45)  Quantitative Gated Spect Images QGS EDV:  78 ml QGS ESV:  18 ml QGS EF:  77 %   Overall Impression  Exercise Capacity: Lexiscan protocol BP Response: Normal blood pressure response. Clinical Symptoms: Chest tightness ECG Impression: No significant ST segment change suggestive of ischemia. Overall Impression Comments: Myoview scan with normal perfusion and apical thinning that most likely represents soft tissue attenuation (breast), cannot exclude subendocardial scar.    No evidence for ischemia.  LVEF is greater than 70% with normal wall motion.  Overall low risk scan.  Appended Document: Cardiology Nuclear Study very good report; thank you for completing this to optimally assess any  possible  peri operative risk related to the new EKG changes. Hopp  Appended Document: Cardiology Nuclear Study Mailed

## 2010-11-10 NOTE — Progress Notes (Signed)
Summary: need to get answer for pt to have surgery  Phone Note From Other Clinic   Caller: Dr.Hopper Summary of Call: need to know it"s okay for pt to have surgery Initial call taken by: Judie Grieve,  January 13, 2010 9:02 AM  Follow-up for Phone Call        Called back.Marland Kitchenadvised low risk scan..they already have a copy. Follow-up by: Suzan Garibaldi RN

## 2010-11-10 NOTE — Progress Notes (Signed)
Summary: refill  Phone Note Refill Request Message from:  Fax from Pharmacy on September 08, 2010 8:34 AM  Refills Requested: Medication #1:  CRESTOR 10 MG TABS 1 by mouth at bedtime  Medication #2:  AMLODIPINE BESYLATE 5 MG TABS 1 once daily in place of Verapamil. [facility] fax  [phone] -ph [phone]  Initial call taken by: Okey Regal Spring,  September 08, 2010 8:38 AM    Prescriptions: AMLODIPINE BESYLATE 5 MG TABS (AMLODIPINE BESYLATE) 1 once daily in place of Verapamil  #30 x 5   Entered by:   Shonna Chock CMA   Authorized by:   Marga Melnick MD   Signed by:   Shonna Chock CMA on 09/08/2010   Method used:   Electronically to        [facility]* (retail)       [address]       [address]       [place], [place]  [address]       Ph: [phone]       Fax: [phone]   RxID:   [id number] CRESTOR 10 MG TABS (ROSUVASTATIN CALCIUM) 1 by mouth at bedtime  #90 x 1   Entered by:   Shonna Chock CMA   Authorized by:   Marga Melnick MD   Signed by:   Shonna Chock CMA on 09/08/2010   Method used:   Electronically to        [facility]* (retail)       [address]       [address] [place] [address]mailing       [place], [place]  [address]       Ph: [phone]       Fax: [phone]   RxID:   [id number]

## 2010-11-10 NOTE — Progress Notes (Signed)
Summary: Refill Request  Phone Note Refill Request Message from:  Patient  Refills Requested: Medication #1:  TRIAMTERENE-HCTZ 37.5-25 MG  CAPS take one tablet daily [facility]     Prescriptions: TRIAMTERENE-HCTZ 37.5-25 MG  CAPS (TRIAMTERENE-HCTZ) take one tablet daily  #90 x 2   Entered by:   Shonna Chock   Authorized by:   Marga Melnick MD   Signed by:   Shonna Chock on 10/27/2009   Method used:   Electronically to        [facility]* (retail)       [address]       [address] [place] [address]mailing       [place], [place]  [address]       Ph: [phone]       Fax: [phone]   RxID:   [id number]

## 2010-11-10 NOTE — Assessment & Plan Note (Signed)
Summary: FEET SURG=4/5, NEEDS CPX ONE WEEK BEFORE////SPH   Vital Signs:  Patient profile:   60 year old female Height:      67.75 inches Weight:      164.2 pounds BMI:     25.24 Temp:     98.4 degrees F oral Pulse rate:   76 / minute Resp:     14 per minute BP sitting:   126 / 82  (left arm) Cuff size:   regular  Vitals Entered By: Shonna Chock (January 05, 2010 3:51 PM) CC: Surgical Clearance-Foot Surgery. Discuss Lisinopril, Pre-op Evaluation Comments REVIEWED MED LIST, PATIENT AGREED DOSE AND INSTRUCTION CORRECT    CC:  Surgical Clearance-Foot Surgery. Discuss Lisinopril and Pre-op Evaluation.  History of Present Illness:  Pre-Op Evaluation      I was asked to see  Laura Poole today for general medical assessment. Dr Charlsie Merles plans Podiatric  surgery 01/13/2010.She has constant pain @ base of both large toes , up to a nine on 10 scale " 24/7"; surgery was recommended in 2009 but " too I had many obligations  then".  The patient denies respiratory symptoms, GI bleeding, chest pain, edema, PND, heavy ETOH use, and smoking.  Patient has no history of acute or recent MI, unstable or severe angina, decompensated CHF, and high grade AV block.  There is no history of antiplatelet agents, chronic steroids, warfarin, diabetes meds, antianginal meds, or  bleeding disorder.  Only symptoms are nasal congestion since 12/29/2009; Zyrtec as needed with benefit . Also she has dyspepsia with certain food triggers, specifically [place] & pizza.                              Despite her negative cardiopulmonary ROS; her EKG reveals progressive NS ST-T changes diffusely vs 03/19/2008. Dr Verl Dicker [facility]) evaluation 04/18/2008 was  reviewed. Studies in 2008 had revealed mild MR & MVP on ECHO & anterior wall scar & peri infarct ischemia on nuclear study.Stress Cardiolite 06/2002 @ [facility] revealed apical thinning w/o ischemia.  Preventive Screening-Counseling & Management  Alcohol-Tobacco     Smoking Status:  never  Caffeine-Diet-Exercise     Does Patient Exercise: no  Allergies (verified): No Known Drug Allergies  Past History:  Past Medical History: Breast cancer, hx of 2006 GERD Colonic polyps, hx of Hypertension Hyperlipidemia  Past Surgical History: chemo & radiation post lumpectomy 2006 Hysterectomy for benign tumor 1990 Colon polypectomy 2004, Dr Ewing Schlein Tubal ligation  Family History: Father: MI @ 66 Mother: HTN,CAD Siblings: bro: aneurysm; nephew  asthma, dyslipidemia, sudden death  Social History: Never Smoked Alcohol use-yes: rarely Regular exercise-no  Review of Systems General:  Denies chills, fever, and sweats. ENT:  Denies difficulty swallowing, ear discharge, earache, and hoarseness; facial pressure relieved by Zyrtec; no frontal headache or purulence. Resp:  Denies cough and sputum productive. GI:  Denies abdominal pain, bloody stools, and dark tarry stools. Allergy:  Complains of itching eyes; denies sneezing.  Physical Exam  General:  well-nourished,in no acute distress; alert,appropriate and cooperative throughout examination Ears:  External ear exam shows no significant lesions or deformities.  Otoscopic examination reveals clear canals, tympanic membranes are intact bilaterally without bulging, retraction, inflammation or discharge. Hearing is grossly normal bilaterally. Nose:  External nasal examination shows no deformity or inflammation. Nasal mucosa are pink and moist with polypoid changes on R Mouth:  Oral mucosa and oropharynx without lesions or exudates.  Teeth in good repair. Neck:  No deformities, masses, or tenderness noted. Lungs:  Normal respiratory effort, chest expands symmetrically. Lungs are clear to auscultation, no crackles or wheezes. Heart:  Normal rate and regular rhythm. S1 and S2 normal without gallop, murmur, click, rub. S4 with slurring Abdomen:  Bowel sounds positive,abdomen soft and non-tender without masses, organomegaly or  hernias noted. Msk:  No deformity or scoliosis noted of thoracic or lumbar spine.   Pulses:  R and L carotid,radial,dorsalis pedis and posterior tibial pulses are full and equal bilaterally Extremities:  No clubbing, cyanosis, edema, with normal full range of motion of all joints.  Exostosis base R 1st toe (MTP joint) > L. Lateral deviation of both great toes Neurologic:  alert & oriented X3 and DTRs symmetrical and normal.   Skin:  Intact without suspicious lesions or rashes Cervical Nodes:  No lymphadenopathy noted Axillary Nodes:  No palpable lymphadenopathy Psych:  memory intact for recent and remote, normally interactive, and good eye contact.     Impression & Recommendations:  Problem # 1:  BUNIONS, BILATERAL (ICD-727.1) with intractable pain; elective surgery deferred until cardiac evaluation completed  Problem # 2:  ELECTROCARDIOGRAM, ABNORMAL (ICD-794.31)  progressive NS ST-T changes vs 03/19/2008.   Orders: EKG w/ Interpretation (93[address])  Problem # 3:  STRESS ELECTROCARDIOGRAM, ABNORMAL (ICD-794.31)  as per 02/2007 study @ [facility]  Orders: EKG w/ Interpretation (93[address]) Cardiolite (Cardiolite)  Problem # 4:  HYPERTENSION (ICD-401.9) controlled The following medications were removed from the medication list:    Lisinopril 20 Mg Tabs (Lisinopril) .[name]... Take one tablet daily Her updated medication list for this problem includes:    Triamterene-hctz 37.5-25 Mg Caps (Triamterene-hctz) .[name]... Take one tablet daily    Labetalol Hcl 300 Mg Tabs (Labetalol hcl) .[name]... Take one tablet twice daily    Amlodipine Besylate 5 Mg Tabs (Amlodipine besylate) .[name]... 1 once daily in place of verapamil  Problem # 5:  HYPERLIPIDEMIA (ICD-272.4) reevaluation indicated for risk assessment Her updated medication list for this problem includes:    Crestor 10 Mg Tabs (Rosuvastatin calcium) .[name]... 1 by mouth at bedtime  Problem # 6:  ATHEROSCLEROTIC HEART DISEASE, FAMILY HX (ICD-V17.3) F  MI; nephew sudden death  Complete Medication List: 1)  Triamterene-hctz 37.5-25 Mg Caps (Triamterene-hctz) .... Take one tablet daily 2)  Labetalol Hcl 300 Mg Tabs (Labetalol hcl) .... Take one tablet twice daily 3)  Protonix 40 Mg Tbec (Pantoprazole sodium) .... Take one tablet twice daily 4)  Tamoxifen Citrate 20 Mg Tabs (Tamoxifen citrate) .[name].. 1 by mouth two times a day 5)  Aspirin 81 Mg Tbec (Aspirin) .... Take one tablet daily 6)  Zolpidem Tartrate 10 Mg Tabs (Zolpidem tartrate) .[name].. 1 by mouth at bedtime as needed 7)  Crestor 10 Mg Tabs (Rosuvastatin calcium) .[name].. 1 by mouth at bedtime 8)  Gabapentin 300 Mg Caps (Gabapentin) .[name].. 1 by mouth at bedtime 9)  Vitamin D3 2000 Unit Caps (Cholecalciferol) .[name].. 1 by mouth once daily 10)  Amlodipine Besylate 5 Mg Tabs (Amlodipine besylate) .[name].. 1 once daily in place of verapamil  Patient Instructions: 1)  Fasting labs @ [facility] in am 01/06/2010: 2)  BMP; 3)  Hepatic Panel; 4)  Lipid Panel; 5)  CBC w/ Diff. Repeat stress test needed  for surgical clearance due to progressive EKG changes in context of positive family history & personal risk factors(HTN, lipids,abnormal prior stress test in 2008).

## 2010-11-10 NOTE — Progress Notes (Signed)
Summary: surgical clearance  Phone Note Call from Patient Call back at Home Phone [phone] Call back at [phone]   Caller: Patient Reason for Call: Talk to Nurse Summary of Call: pt is suppose to have surgery today... hospital needs clearance Initial call taken by: Migdalia Dk,  January 13, 2010 8:45 AM  Follow-up for Phone Call        [facility] that she was cleared for surgery with a low risk scan. Follow-up by: Suzan Garibaldi RN

## 2010-11-10 NOTE — Letter (Signed)
Summary: [facility]  [facility]   Imported By: Lanelle Bal 07/01/2010 10:00:08  _____________________________________________________________________  External Attachment:    Type:   Image     Comment:   External Document

## 2010-11-10 NOTE — Progress Notes (Signed)
Summary: refill  Phone Note Refill Request Message from:  Fax from Pharmacy on [place] outpatient pharmacy fax [phone]  Refills Requested: Medication #1:  ZOLPIDEM TARTRATE 10 MG TABS 1 by mouth at bedtime as needed  Medication #2:  CRESTOR 10 MG TABS 1 by mouth at bedtime Initial call taken by: Barb Merino,  October 16, 2009 2:27 PM    Prescriptions: CRESTOR 10 MG TABS (ROSUVASTATIN CALCIUM) 1 by mouth at bedtime  #90 x 1   Entered by:   Shonna Chock   Authorized by:   Marga Melnick MD   Signed by:   Shonna Chock on 10/16/2009   Method used:   Printed then faxed to ...       [facility]* (retail)       [address]       [address] Shipping/mailing       [place], [place]  [address]       Ph: [phone]       Fax: [phone]   RxID:   [id number] ZOLPIDEM TARTRATE 10 MG TABS (ZOLPIDEM TARTRATE) 1 by mouth at bedtime as needed  #10 x 0   Entered by:   Shonna Chock   Authorized by:   Marga Melnick MD   Signed by:   Shonna Chock on 10/16/2009   Method used:   Printed then faxed to ...       [facility]* (retail)       [address]       [address] Shipping/mailing       [place], [place]  [address]       Ph: [phone]       Fax: [phone]   RxID:   [id number]

## 2010-11-10 NOTE — Progress Notes (Signed)
Summary: Nuclear Pre-Procedure  Phone Note Outgoing Call Call back at [facility] Phone [phone]   Call placed by: Stanton Kidney, EMT-P,  January 08, 2010 2:17 PM Action Taken: Phone Call Completed Summary of Call: Left message with information on Myoview Information Sheet (see scanned document for details).     Nuclear Med Background Indications for Stress Test: Evaluation for Ischemia, Surgical Clearance, Abnormal EKG  Indications Comments: Pending foot surgery  History: Echo, History of Chemo, Myocardial Perfusion Study, MVP  History Comments: '08 Echo: NL LVF, mod. LVH, MVP '08 MPS: EF=69%, anterior scar, peri-infarct ischemia     Nuclear Pre-Procedure Cardiac Risk Factors: Family History - CAD, Hypertension, Lipids Height (in): 67.75

## 2010-11-10 NOTE — Progress Notes (Signed)
Summary: Request for Clearance  Phone Note From Other Clinic Call back at Phone [phone], Fax [phone]   Caller: [facility] Summary of Call: Message left on VM: Need surgical clearance by 4pm today. Patient had a stress test today, Dr.Ross will read stress test and Dr.Zareya Tuckett needs to give the OK to have surgery(Pending tomorrow). If cleared please fax last EKG and note stating patient cleared.   Dr.Faaris Arizpe please advise./[facility]rae [facility]  January 12, 2010 2:32 PM   Follow-up for Phone Call        Cardiology will call stress results to day Surgery to allow clearance if negative Follow-up by: Marga Melnick MD,  January 12, 2010 6:09 PM

## 2010-11-12 NOTE — Assessment & Plan Note (Signed)
Summary: back pain, diarrhea, headache///sph   Vital Signs:  Patient profile:   60 year old female Height:      67.75 inches (172.09 cm) Weight:      173 pounds (78.64 kg) BMI:     26.59 Temp:     99.0 degrees F (37.22 degrees C) oral BP sitting:   140 / 90  (left arm)  Vitals Entered By: Lucious Groves CMA (October 23, 2010 12:40 PM) CC: C/O abd pain, back pain, diarrhea, and HA./kb, Back pain, Abdominal pain Is Patient Diabetic? No Pain Assessment Patient in pain? yes     Location: abd/back Intensity: 10 Type: ache Comments Patient notes that she has been having fever and indigestion. She denies rectal bleeding/blood in stool, chest pain, and SOB.   CC:  C/O abd pain, back pain, diarrhea, and HA./kb, Back pain, and Abdominal pain.  History of Present Illness:      This is a 60 year old woman who presents with Back pain X 2-3 weeks .  The patient reports fever only last night with  chills, weakness in legs x 2 days, and urinary incontinence for 2-3 months.She  denies loss of sensation, fecal incontinence, and dysuria.  The pain is located in the mid low back.  The pain began at work and suddenly w/o injury.  The pain radiates to the right  mid abdomen.  The pain  has no exacerbating factors.  The pain is made better by heat.  Risk factors for serious underlying conditions include age >= 50 years and history of cancer.  The patient denies nausea, vomiting, and melena, but the stool was liquid & dark this am.  The patient denies the following symptoms: dark urine and vaginal bleeding.   PMH of polyps  2004 & 2009, Dr Ewing Schlein. PMH of similar symptoms several  years ; Dr Ewing Schlein diagnosed small gallstone @ that time.  Current Medications (verified): 1)  Triamterene-Hctz 37.5-25 Mg  Caps (Triamterene-Hctz) .... Take One Tablet Daily 2)  Labetalol Hcl 300 Mg Tabs (Labetalol Hcl) .... Take One Tablet Twice Daily 3)  Tamoxifen Citrate 20 Mg  Tabs (Tamoxifen Citrate) .[name].. 1 By Mouth Two Times A  Day 4)  Aspirin 81 Mg  Tbec (Aspirin) .... Take One Tablet Daily 5)  Zolpidem Tartrate 10 Mg Tabs (Zolpidem Tartrate) .[name].. 1 By Mouth At Bedtime As Needed 6)  Crestor 10 Mg Tabs (Rosuvastatin Calcium) .[name].. 1 By Mouth At Bedtime 7)  Gabapentin 300 Mg Caps (Gabapentin) .[name].. 1 By Mouth At Bedtime 8)  Vitamin D3 2000 Unit Caps (Cholecalciferol) .[name].. 1 By Mouth Once Daily 9)  Amlodipine Besylate 5 Mg Tabs (Amlodipine Besylate) .[name].. 1 Once Daily in Place of Verapamil 10)  Nexium 40 Mg Cpdr (Esomeprazole Magnesium) .[name].. 1 By Mouth Two Times A Day  Allergies (verified): No Known Drug Allergies  Past History:  Past Medical History: Breast cancer, hx of 2006 GERD Colonic polyps, PMH  of Hypertension Hyperlipidemia  Past Surgical History: chemo & radiation post lumpectomy 2006 Hysterectomy for benign tumor 1990 Colon polypectomy 2004 & 2009, Dr Ewing Schlein Tubal ligation  Review of Systems GU:  Denies discharge and hematuria. Derm:  Denies lesion(s) and rash.  Physical Exam  General:  well-nourished,in no acute distress; alert,appropriate and cooperative throughout examination Eyes:  No corneal or conjunctival inflammation noted. Arcus senilis; no icterus Mouth:  Oral mucosa and oropharynx without lesions or exudates.  Teeth in good repair. No pharyngeal erythema.   Tongue moist Lungs:  Normal respiratory  effort, chest expands symmetrically. Lungs are clear to auscultation, no crackles or wheezes. Heart:  Normal rate and regular rhythm. S1 and S2 normal without  murmur, click, rub. S4 gallop Abdomen:  Bowel sounds positive,abdomen soft  but slightly tender R mid - lower abdomen  without masses, organomegaly or hernias noted. Msk:  She lay down & sat up w/o help; no pain to percussion Extremities:  No clubbing, cyanosis, edema. Neg SLR Neurologic:  strength normal in all extremities and DTRs symmetrical and normal.   Skin:  Intact without suspicious lesions or rashes. No tenting Cervical  Nodes:  No lymphadenopathy noted Axillary Nodes:  No palpable lymphadenopathy   Impression & Recommendations:  Problem # 1:  LOW BACK PAIN SYNDROME, SEVERE (ICD-724.2)  Her updated medication list for this problem includes:    Aspirin 81 Mg Tbec (Aspirin) .[name]... Take one tablet daily    Tramadol Hcl 50 Mg Tabs (Tramadol hcl) .[name]... 1 every 6 hrs as needed for pain  Orders: Venipuncture ([address]) TLB-Hepatic/Liver Function Pnl (80076-HEPATIC) T-Culture, Urine (60454-09811) Specimen Handling (91478)  Problem # 2:  ABDOMINAL PAIN (ICD-789.00)  Orders: Venipuncture (29562) TLB-Hepatic/Liver Function Pnl (80076-HEPATIC) TLB-CBC Platelet - w/Differential (85025-CBCD) TLB-Amylase (82150-AMYL) TLB-Lipase (83690-LIPASE) T-Culture, Urine (13086-57846) Specimen Handling (96295)  Problem # 3:  FEVER (ICD-780.60)  Problem # 4:  DIARRHEA (ICD-787.91)  Complete Medication List: 1)  Triamterene-hctz 37.5-25 Mg Caps (Triamterene-hctz) .... Take one tablet daily 2)  Labetalol Hcl 300 Mg Tabs (Labetalol hcl) .... Take one tablet twice daily 3)  Tamoxifen Citrate 20 Mg Tabs (Tamoxifen citrate) .[name].. 1 by mouth two times a day 4)  Aspirin 81 Mg Tbec (Aspirin) .... Take one tablet daily 5)  Zolpidem Tartrate 10 Mg Tabs (Zolpidem tartrate) .[name].. 1 by mouth at bedtime as needed 6)  Crestor 10 Mg Tabs (Rosuvastatin calcium) .[name].. 1 by mouth at bedtime 7)  Gabapentin 300 Mg Caps (Gabapentin) .[name].. 1 by mouth at bedtime 8)  Vitamin D3 2000 Unit Caps (Cholecalciferol) .[name].. 1 by mouth once daily 9)  Amlodipine Besylate 5 Mg Tabs (Amlodipine besylate) .[name].. 1 once daily in place of verapamil 10)  Nexium 40 Mg Cpdr (Esomeprazole magnesium) .[name].. 1 by mouth two times a day 11)  Tramadol Hcl 50 Mg Tabs (Tramadol hcl) .[name].. 1 every 6 hrs as needed for pain 12)  Ciprofloxacin Hcl 500 Mg Tabs (Ciprofloxacin hcl) .[name].. 1 two times a day  Other Orders: UA Dipstick W/ Micro (manual) (28413)  Patient Instructions: 1)   Drink clear liquids only for the next 24 hours, then slowly add other liquids and food as you  tolerate them. To ER if symptoms progress, with this note.Take 650-1000mg  of Tylenol every 4-6 hours as needed for relief of pain or comfort of fever AVOID taking more than 4000mg   in a 24 hour period (can cause liver damage in higher doses) OR take 400-600mg  of Ibuprofen (Advil, Motrin) with food every 4-6 hours as needed for relief of pain or comfort of fever. Urology evaluation recommended if urinary  incontinence persists Prescriptions: CIPROFLOXACIN HCL 500 MG TABS (CIPROFLOXACIN HCL) 1 two times a day  #14 x 0   Entered and Authorized by:   Marga Melnick MD   Signed by:   Marga Melnick MD on 10/23/2010   Method used:   Electronically to        [facility]* (retail)       [address]       [address] Shipping/mailing  [place], [place]  [address]       Ph: [phone]       Fax: [phone]   RxID:   [id number] TRAMADOL HCL 50 MG TABS (TRAMADOL HCL) 1 every 6 hrs as needed for pain  #30 x 0   Entered and Authorized by:   Marga Melnick MD   Signed by:   Marga Melnick MD on 10/23/2010   Method used:   Electronically to        [facility]* (retail)       [address]       [address] Shipping/mailing       [place], [place]  [address]       Ph: [phone]       Fax: [phone]   RxID:   [id number]    Orders Added: 1)  Est. Patient Level IV [88416] 2)  Venipuncture [60630] 3)  TLB-Hepatic/Liver Function Pnl [80076-HEPATIC] 4)  TLB-CBC Platelet - w/Differential [85025-CBCD] 5)  TLB-Amylase [82150-AMYL] 6)  TLB-Lipase [83690-LIPASE] 7)  T-Culture, Urine [[address]-93235] 8)  Specimen Handling [99000] 9)  UA Dipstick W/ Micro (manual) [81000]    Laboratory Results   Urine Tests   Date/Time Reported: October 23, 2010 1:37 PM   Routine Urinalysis   Color: yellow Appearance: Clear Glucose: negative   (Normal Range:  Negative) Bilirubin: negative   (Normal Range: Negative) Ketone: negative   (Normal Range: Negative) Spec. Gravity: <1.005   (Normal Range: 1.003-1.035) Blood: large   (Normal Range: Negative) pH: 6.5   (Normal Range: 5.0-8.0) Protein: negative   (Normal Range: Negative) Urobilinogen: negative   (Normal Range: 0-1) Nitrite: negative   (Normal Range: Negative) Leukocyte Esterace: negative   (Normal Range: Negative)    Comments: Floydene Flock  October 23, 2010 1:37 PM cx sent

## 2010-11-12 NOTE — Progress Notes (Signed)
Summary: Refill Request  Phone Note Refill Request Call back at [phone] Message from:  Pharmacy on October 13, 2010 2:05 PM  Refills Requested: Medication #1:  LABETALOL HCL 300 MG TABS take one tablet twice daily   Dosage confirmed as above?Dosage Confirmed   Supply Requested: 180   Last Refilled: 04/14/2010 [facility]  Next Appointment Scheduled: none Initial call taken by: Harold Barban,  October 13, 2010 2:06 PM    Prescriptions: LABETALOL HCL 300 MG TABS (LABETALOL HCL) take one tablet twice daily  #180 Tablet x 0   Entered by:   Shonna Chock CMA   Authorized by:   Marga Melnick MD   Signed by:   Shonna Chock CMA on 10/13/2010   Method used:   Electronically to        [facility]* (retail)       [address]       [address] [place] [address]       [place], [place]  [address]       Ph: [phone]       Fax: [phone]   RxID:   [id number]

## 2010-11-12 NOTE — Progress Notes (Signed)
Summary: Culture Results/Status Update  Phone Note Outgoing Call Call back at [facility] Phone [phone] Call back at [phone]   Call placed by: Shonna Chock CMA,  October 26, 2010 11:31 AM Call placed to: Patient Details for Reason: Culture results/Status Update Summary of Call: Spoke with patient: 1.) Informed of culture results 2.) Still nauseated, didnt keep any food down Sat.  Patient just started back eating food today, patient stated one of her meds makes her dizzy ( Cipro), patient only able to tolerate 1 x daily ( RX'ed for two times a day). Patient with appointment with Cancer doctor tomorrow [facility])   Shonna Chock CMA  October 26, 2010 11:33 AM   Follow-up for Phone Call        please FAX OV & labs to Cancer Specialist Follow-up by: Marga Melnick MD,  October 26, 2010 2:17 PM  Additional Follow-up for Phone Call Additional follow up Details #1::        Faxed to [phone]

## 2010-11-26 NOTE — Letter (Signed)
Summary: [facility]  [facility]   Imported By: Maryln Gottron 11/20/2010 11:20:50  _____________________________________________________________________  External Attachment:    Type:   Image     Comment:   External Document

## 2010-12-23 ENCOUNTER — Encounter: Payer: Self-pay | Admitting: Cardiology

## 2011-01-06 ENCOUNTER — Ambulatory Visit: Payer: Self-pay | Admitting: Cardiology

## 2011-01-26 ENCOUNTER — Encounter: Payer: Self-pay | Admitting: Cardiology

## 2011-02-16 NOTE — Op Note (Signed)
NAME:  Laura Poole, Laura Poole NO.:  [id number]  MEDICAL RECORD NO.:  [id number]          PATIENT TYPE:  AMB  LOCATION:  DSC                          FACILITY:  [facility]  PHYSICIAN:  Lenn Sink, D.P.M.DATE OF BIRTH:  Dec 16, 1950  DATE OF PROCEDURE:  01/28/2010 DATE OF DISCHARGE:  01/13/2010                              OPERATIVE REPORT  PREOPERATIVE DIAGNOSES: 1. 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 2. Hammertoe deformity fifth digit left. 3. Hammertoe deformity fourth digit left. 4. Hammertoe deformity fifth digit right.  POSTOPERATIVE DIAGNOSES: 1. 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 2. Hammertoe deformity fifth digit left. 3. Hammertoe deformity fourth digit left. 4. Hammertoe deformity fifth digit right.  PROCEDURE: 1. Austin with 4.5 K-wire, bilateral. 2. Arthroplasty digit 5, left. 3. Arthroplasty digit 4, left. 4. Arthroplasty digit 5, left.  SURGEON:  Lenn Sink, DPM.  ASSISTANT SURGEON:  Helane Gunther, DPM.  ANESTHESIA:  Used approximately 20 mL Xylocaine, Marcaine mixture preoperatively.  INDICATIONS:  Chronic discomfort, inability to wear shoe gear without difficulty, has tried wider shoes, has tried padding, antiinflammatories, physical therapy without relieve of symptoms, and desires bone correction.  PROCEDURES: 1. Austin with 4.5 K-wire left.  Attention was directed to dorsal     aspect, left foot where an approximately 6 cm linear incision was     made medial to the extensor hallucis longus tendon.  The incision     was deepened through subcutaneous tissue down to capsule and an     inverted L-shaped capsular incision was performed.  The capsular     tissue was sharply dissected of the underlying bone and a large     hyperostosis was noted in the first metatarsal.  Utilizing the bone     cutting saw, the medial hyperostosis was resected plus with the     shaft of the first metatarsal.  The first  interspace was then     entered and the adductor tendon was released of the base of the     proximal phalanx and the fibular sesamoid was released in order to     reduce the stress and to reduce the intermetatarsal angle.     Attention was then directed back to the medial aspect of the foot     where a D-shaped osteotomy was performed in the first metatarsal.     Apex with metaphysis at base of the level of the anatomical neck of     the first metatarsal.  The osteotomy was carried through the     lateral cortex and the capital fragment was transposed in a lateral     direction so as to reduce the increased one to intermetatarsal     ankle.  It was fixated with 4.5 K-wire, found to be in an excellent     position, and a redundant medial shelf was resected, flushed with     the shaft of the first metatarsal.  Roughened bone edges were     smooth and the wound was flushed with copious amounts of sterile     gentamicin solution.  The capsule was reapproximated utilizing  3-0     Monocryl.  The subcutaneous tissue with 4-0 Monocryl and the skin     with 5-0 Monocryl in a subcuticular fashion.  I evaluated the     position of the first metatarsal at this time, found it to be in     good alignment, and decided Akin osteotomy was not necessary for     this particular foot type. 2. Arthroplasty digit 5, left.  Attention was directed to the digit 5     left where a semioblique incision was made, mid distal, medial to a     proximal lateral direction, taking out a wedge of tissue.  The     incision was deepened through subcutaneous tissue and the     intervening skin wedge was removed in toto.  A transverse incision     into the extensor expansion was made the level of the     interphalangeal joint and the medial and lateral collateral     ligament to the medial interphalangeal joint were severed.  The     head of the proximal phalanx was delivered from the wound and     resected in toto.  The  wound was flushed with copious amounts of     sterile gentamicin solution and the wound edges were reapproximated     utilizing 5-0 nylon. 3. Attention was directed to the fourth toe left foot where a linear     semi-elliptical incision was performed, centered over the proximal     interphalangeal joint.  The incision was deepened through     subcutaneous tissue and the intervening skin wedge was removed in     toto.  A transverse incision into the extensor expansion was made     at the proximal interphalangeal joint and the medial and lateral     collateral ligaments to the proximal interphalangeal joint was     severed.  The head of the proximal phalanx was delivered off the     wound and resected in toto.  The wound was flushed with copious     amounts of sterile gentamicin solution and was sutured with 5-0     nylon.  The site on the left foot were then infiltrated with a 1.5     mL of dexamethasone proximal to the incision sites and a sterile     dressing was applied.  Tourniquet was released, capillary refill     noted to be immediate to all digits on the right foot.  Attention     was then directed to the right foot which was exsanguinated     utilizing Esmarch and the right ankle tourniquet was inflated to     250 mmHg.  The following procedure was performed: 4. Austin with 4-5 K-wire, right.  For all attempts and purposes, this     procedure was performed in an identical fashion of the procedure #1     for the left foot. 5. Arthroplasty of digit 5, right.  For all attempts and purposes,     this procedure was performed in an identical fashion of the     procedure #2 for the left foot.  Procedures on the right foot were     infiltrated with 1 to 1.5 mL of dexamethasone proximal to the     incision sites.  Sterile dressings were applied and the tourniquet     was released right indicating good perfusion and cap load to the  right foot.  The patient was taken to the recovery  room in     satisfactory condition and was discharged by the department of     Anesthesia with all appropriate meds and postop instructions for     this particular procedure.     Lenn Sink, D.P.M.    NSR/MEDQ  D:  01/28/2010  T:  01/29/2010  Job:  [id number]  Electronically Signed by Cristie Hem D.P.M. on 02/16/2011 01:04:02 PM

## 2011-02-23 NOTE — Op Note (Signed)
NAME:  Laura Poole, Laura Poole NO.:  [id number]   MEDICAL RECORD NO.:  [id number]          PATIENT TYPE:  AMB   LOCATION:  ENDO                         FACILITY:  [facility]   PHYSICIAN:  Petra Kuba, M.D.    DATE OF BIRTH:  1951-09-03   DATE OF PROCEDURE:  12/12/2008  DATE OF DISCHARGE:                               OPERATIVE REPORT   PROCEDURE:  EGD.   INDICATIONS:  Upper tract symptoms.   MEDICINES USED:  Fentanyl 75 mcg, Versed 7 mg.   DESCRIPTION OF PROCEDURE:  The video endoscope was inserted by direct  vision.  The esophagus was normal.  She did have a small hiatal hernia.  Scope passed into the stomach and advanced through a normal antrum,  normal pylorus, normal duodenal bulb, and around the C-loop to a normal  second portion of the duodenum.  Scope was then slowly withdrawn back to  the bulb and a good look there ruled out ulcers in that location.  Scope  was withdrawn back to the stomach and retroflexed.  High in the cardia,  the hiatal hernia was confirmed.  The fundus, angularis, lesser and  greater curve were normal on retroflexed visualization.  Straight  visualization of the stomach did not reveal any additional findings.  The scope was then slowly withdrawn again confirming the normal  esophagus.  Scope was removed.  The patient tolerated the procedure  well.  There was no obvious immediate complication.   ENDOSCOPIC DIAGNOSES:  1. Small hiatal hernia.  2. Otherwise, normal EGD.   PLAN:  Continue pump inhibitors and continue workup with a colonoscopy.           ______________________________  Petra Kuba, M.D.     MEM/MEDQ  D:  12/12/2008  T:  12/12/2008  Job:  [id number]   cc:   Titus Dubin. Alwyn Ren, MD,FACP,FCCP  Cherylynn Ridges, M.D.

## 2011-02-23 NOTE — Op Note (Signed)
NAME:  Laura Poole, Laura Poole NO.:  [id number]   MEDICAL RECORD NO.:  [id number]          PATIENT TYPE:  AMB   LOCATION:  ENDO                         FACILITY:  [facility]   PHYSICIAN:  Petra Kuba, M.D.    DATE OF BIRTH:  1951/05/04   DATE OF PROCEDURE:  DATE OF DISCHARGE:                               OPERATIVE REPORT   PROCEDURE:  Colonoscopy.   INDICATION:  The patient with breast cancer and due for colonic  screening.  Consent was signed after risks, benefits, methods, and  options were thoroughly discussed multiple times in the past.   MEDICINES USED:  1. Fentanyl 100 mcg.  2. Versed 9 mg.   PROCEDURE:  Rectal inspection is pertinent for small external  hemorrhoids.  Digital exam was negative.  The video pediatric  colonoscope was inserted, and easily advanced around the colon to the  cecum.  This did require some abdominal pressure, but no position  changes.  No abnormalities were seen on insertion.  The cecum was  identified by the appendiceal orifice and the ileocecal valve.  The  scope was slowly withdrawn.  The prep was adequate.  There was some  liquid stool that required washing and suctioning.  In the mid ascending  colon, the tiny polyp was seen and was cold biopsied x2.  The scope was  further withdrawn.  No other abnormalities were seen including no  polyps, tumors, masses, or diverticula we slowly withdrew back to the  rectum.  Anorectal pull-through and retroflexion confirmed some small  hemorrhoids.  The scope was drained and readvanced slowly towards the  left side of the colon.  Air was suctioned and scope removed.  The  patient tolerated the procedure well.  There was no obvious immediate  complication.   ENDOSCOPIC DIAGNOSES:  1. Internal and external small hemorrhoids.  2. Tiny ascending polyp cold biopsy.  3. Otherwise within normal limits to the cecum.   PLAN:  Await pathology.  Follow up p.r.n.  Probably repeat in 5 years.     ______________________________  Petra Kuba, M.D.     MEM/MEDQ  D:  12/12/2008  T:  12/12/2008  Job:  [id number]   cc:   Titus Dubin. Alwyn Ren, MD,FACP,FCCP  Cherylynn Ridges, M.D.

## 2011-02-26 NOTE — Op Note (Signed)
   NAME:  Laura Poole, Laura Poole NO.:  [id number]   MEDICAL RECORD NO.:  [id number]                   PATIENT TYPE:  AMB   LOCATION:  ENDO                                 FACILITY:  [facility]   PHYSICIAN:  Petra Kuba, M.D.                 DATE OF BIRTH:  Apr 15, 1951   DATE OF PROCEDURE:  08/15/2003  DATE OF DISCHARGE:                                 OPERATIVE REPORT   PROCEDURE:  Colonoscopy with biopsy.   INDICATIONS:  Guaiac positivity, anemia, and due for colonic screening.   PROCEDURE:  The consent was signed after the risks, benefits, methods, and  options were thoroughly discussed in the office.   MEDICINES USED:  Demerol 70 and Versed 10.   PROCEDURE:  Rectal inspection was pertinent for external hemorrhoids.  Small  digital exam was negative.  The video pediatric adjustable colonoscope was  inserted and with abdominal pressure easily advanced around the colon to the  cecum.  No abnormalities were seen on insertion.  The cecum was identified  by the appendiceal orifice and the ileocecal valve.  In fact, the scope was  inserted a short ways into the terminal ileum, which was normal.  Photo  documentation was obtained.  The scope was slowly withdrawn.  Prep was  adequate.  There was some liquid stool that required washing and suctioning.  On slow withdrawal to the colon, the cecum, ascending, transverse,  descending, and the majority of the sigmoid were normal.  In the distal  sigmoid and rectum, a few tiny probable hyperplastic-appearing polyps were  seen and were cold biopsied and put in the same container.  Anorectal pull-  through and retroflexion confirms the small hemorrhoids.  The scope was  reinserted a short ways into the left side of the colon.  Air was suctioned  and scope removed.  The patient tolerated the procedure well.  There was no  obvious immediate complication.   ENDOSCOPIC DIAGNOSES:  1. Internal and external hemorrhoids.  2. A  few tiny hyperplastic-appearing distal sigmoid and rectal polyps, cold     biopsied.  3. Otherwise, within normal limits to the terminal ileum.   PLAN:  1. Await pathology to determine future colonic screening.  2. Probably recheck in five years.  3. Continue workup with an EGD.  See that dictation for other workup plans.                                               Petra Kuba, M.D.    MEM/MEDQ  D:  08/15/2003  T:  08/15/2003  Job:  [id number]   cc:   Titus Dubin. Alwyn Ren, M.D. [facility]

## 2011-02-26 NOTE — Op Note (Signed)
   NAME:  Laura Poole, LANGBEHN NO.:  [id number]   MEDICAL RECORD NO.:  [id number]                   PATIENT TYPE:  AMB   LOCATION:  ENDO                                 FACILITY:  [facility]   PHYSICIAN:  Petra Kuba, M.D.                 DATE OF BIRTH:  06-12-1951   DATE OF PROCEDURE:  08/15/2003  DATE OF DISCHARGE:                                 OPERATIVE REPORT   PROCEDURE PERFORMED:  Esophagogastroduodenoscopy.   ENDOSCOPIST:  Petra Kuba, M.D.   INDICATIONS FOR PROCEDURE:  Upper tract symptoms, guaiac positivity, anemia,  nondiagnostic colonoscopy.  Consent was signed after the risks, benefits,  and options were thoroughly discussed in the office and prior to any premeds  given.   ADDITIONAL MEDICINES USED:  2 mg Versed only.   DESCRIPTION OF PROCEDURE:  The video endoscope was inserted by direct  vision.  Esophagus was normal.  She did have a moderate sized hiatal hernia  without any signs of Barrett's or significant esophagitis.  Scope passed  into the stomach.  Unfortunately, she was unable to hold air.  We  advanced  through a normal antrum, normal pylorus into a normal duodenal bulb and  around the C-loop to a normal second portion of the duodenum.  The scope was  withdrawn back to the bulb and a good look there ruled out ulcers in that  location.  Scope was withdrawn back to the stomach but only fair  visualization was obtained on retroflex and straight visualization due to  her inability to hold air.  She also had a cold and did have some coughing.  The scope was then slowly withdrawn.  Again, a good look at the hiatal  hernia pouch and the esophagus confirmed the above findings.  Scope was  removed.  The patient tolerated the procedure well.  There were no obvious  immediate complication.   ENDOSCOPIC DIAGNOSIS:  1. Moderate hiatal hernia.  2. Unable to hold air and get excellent look into the stomach.  However, no     lesions seen.  3.  Otherwise normal esophagogastroduodenoscopy.   PLAN:  Continue pump inhibitors.  Follow up p.r.n.  Otherwise return care to  Dr. Alwyn Ren for the customary health care maintenance to include yearly  rectals, guaiacs and CBCs but since guaiac negative on recheck and  asymptomatic on pump inhibitors, okay to hold further work-up at this time.  Consider a one time small bowel follow-through at some point in the future.                                               Petra Kuba, M.D.    MEM/MEDQ  D:  08/15/2003  T:  08/15/2003  Job:  [id number]

## 2011-02-26 NOTE — Op Note (Signed)
NAME:  Laura Poole, REMMERS NO.:  [id number]   MEDICAL RECORD NO.:  [id number]          PATIENT TYPE:  AMB   LOCATION:  DSC                          FACILITY:  [facility]   PHYSICIAN:  Rose Phi. Maple Hudson, M.D.   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   OPERATION:  1.  Blue dye injection.  2.  Left partial mastectomy with needle localization and specimen mammogram.  3.  Left sentinel lymph node biopsy.   SURGEON:  Rose Phi. Maple Hudson, M.D.   ANESTHESIA:  General.   OPERATIVE PROCEDURE:  Prior to coming to the operating room a localizing  wire had been placed in the lesion in the medial portion of her left breast  and 1 mCi of technetium sulfur colloid was injected intradermally.   After suitable general anesthesia was induced, the patient was placed in the  supine position with the arms extended on the arm board.  Five milliliters  of a mixture of 2 mL of methylene blue and 3 mL of injectable saline were  injected the subareolar breast tissue and the breast gently massaged for 3  minutes.   We then prepped and draped her in a standard fashion.   A curved incision was made in the upper-inner quadrant of her left breast  using the previously placed wire as a guide.  The wire was delivered into  the incision and a wide excision of the wire and surrounding tissue was  carried out.  The specimens was oriented for the pathologist.  It was then  submitted to the radiologist for a specimen mammogram.   While that was being done, a short transverse left axillary incision was  made with dissection through subcutaneous tissue to the clavipectoral  fascia.  Deep to the fascia were some blue lymphatics which easily dissected  and traced to 2 sentinel lymph nodes which  were then excised and submitted  to the pathologist.   The specimen mammogram confirmed the removal of the lesion.  The margins  were interpreted as negative.  The sentinel node was interpreted as  negative.   Both incisions were then injected with 0.25% Marcaine and then closed in 2  layers with 3-0 Vicryl and subcuticular 4-0 Monocryl and Steri-Strips.   Dressings were applied and the patient transferred to the recovery room in  satisfactory condition, having tolerated the procedure well.      Rose Phi. Maple Hudson, M.D.  Electronically Signed     PRY/MEDQ  D:  05/31/2005  T:  06/01/2005  Job:  [id number]

## 2011-02-26 NOTE — Op Note (Signed)
NAME:  Laura Poole, Laura Poole NO.:  [id number]   MEDICAL RECORD NO.:  [id number]          PATIENT TYPE:  AMB   LOCATION:  DSC                          FACILITY:  [facility]   PHYSICIAN:  Rose Phi. Maple Hudson, M.D.   DATE OF BIRTH:  [birth year]   DATE OF PROCEDURE:  06/21/2005  DATE OF DISCHARGE:                                 OPERATIVE REPORT   PREOPERATIVE DIAGNOSIS:  Stage I carcinoma left breast.   POSTOPERATIVE DIAGNOSIS:  Stage I carcinoma left breast.   OPERATION:  Insertion of Port-A-Cath under fluoroscopic control.   SURGEON:  Rose Phi. Maple Hudson, M.D.   ANESTHESIA:  MAC   OPERATIVE PROCEDURE:  The patient placed on the operating table with arms by  the side and the face turned to the left and the right upper chest and neck  prepped and draped in usual fashion.   Under local anesthesia a right subclavian puncture was carried out without  difficulty and the guidewire inserted and proper positioning confirmed by C-  arm fluoroscopy.   We then made an incision on the anterior chest wall and developed pocket for  the implantable port. I tunneled between the subclavian puncture site and  the new port site and passed the catheter through that then connected the  catheter to the Bard export and placed it and the newly developed pocket.  The catheter tip was then trimmed to go to the fourth interspace at the  level of the cavoatrial junction.   We then passed the dilator and peel-away sheath over the wire, removed the  wire and the dilator and passed the catheter through the peel-away sheath  and then removed it.   Again, C-arm fluoroscopy was used to confirm that there was no kinking and  the system at the catheter tip was then the superior vena cava.   I then anchored the port in the pocket with two 2-0 Prolene sutures. The  incisions were closed with 3-0 Vicryl subcuticular 4-0 Monocryl and Steri-  Strips.   I then accessed the port and aspirated and easily irrigated and  fully  heparinized it and then removed the needle.   Dressings were applied and the patient transferred to recovery room in  satisfactory condition having tolerated procedure well.      Rose Phi. Maple Hudson, M.D.  Electronically Signed     PRY/MEDQ  D:  06/21/2005  T:  06/22/2005  Job:  [id number]

## 2011-02-26 NOTE — Procedures (Signed)
[facility]  Patient:    Laura Poole, Laura Poole                     MRN: [id number] Proc. Date: 06/09/00 Adm. Date:  [id number] Attending:  Judeth Cornfield                           Procedure Report  PROCEDURE:  Colonoscopy.  HISTORY:  Ms. Pittman is a 60 year old African-American female with new onset constipation.  She was initially scheduled for sigmoidoscopy.  At her request, sedation was given and, due to the excellent prep, full colonoscopy was performed.  INFORMED CONSENT:  The patient provided consent after risks, benefits, and alternatives were explained.  MEDICATIONS:  Versed 5, fentanyl 62.5 mcg IV.  DESCRIPTION OF PROCEDURE:  The patient was placed in the left lateral decubitus position and administered continuous low flow oxygen and placed on pulse oximetry.  The Olympus video colonoscope was inserted to the mid ascending colon.  Further examination was not possible due to retained solid stool.  FINDINGS:  Normal colonoscopy to the ascending colon.  RECOMMENDED:  Begin Perdiem 1 tsp. daily.  If no bowel movement in two to three days, would add Perdiem plus senna. DD:  06/09/00 TD:  06/10/00 Job: [id number]

## 2011-02-26 NOTE — Op Note (Signed)
NAME:  Laura Poole, Laura Poole NO.:  [id number]   MEDICAL RECORD NO.:  [id number]          PATIENT TYPE:  AMB   LOCATION:  DSC                          FACILITY:  [facility]   PHYSICIAN:  Rose Phi. Maple Hudson, M.D.   DATE OF BIRTH:  1950/12/22   DATE OF PROCEDURE:  09/15/2005  DATE OF DISCHARGE:                                 OPERATIVE REPORT   PREOPERATIVE DIAGNOSIS:  Stage I carcinoma left breast.   POSTOPERATIVE DIAGNOSIS:  Stage I carcinoma left breast.   OPERATION:  Removal of Port-A-Cath.   SURGEON:  Rose Phi. Maple Hudson, M.D.   ANESTHESIA:  Local.   OPERATIVE PROCEDURE:  The patient was placed on the operating table with the  arm by the side and the right upper chest prepped and draped in the usual  fashion. After obtaining good local anesthesia with 1% Xylocaine and  adrenaline, a short transverse incision was made and the port exposed. The  catheter was grasped and removed from the subclavian vein. There was no  bleeding.   With traction on the catheter, the two sutures holding the port in place  were divided and the port slipped from the pocket.   Again with good hemostasis, a subcuticular closure of 4-0 Monocryl and Steri-  Strips was carried out. Dressing applied. The patient then allowed to go  home.      Rose Phi. Maple Hudson, M.D.  Electronically Signed     PRY/MEDQ  D:  09/15/2005  T:  09/15/2005  Job:  [id number]

## 2011-05-11 ENCOUNTER — Encounter: Payer: Self-pay | Admitting: Internal Medicine

## 2011-05-12 ENCOUNTER — Encounter: Payer: Self-pay | Admitting: Internal Medicine

## 2011-05-12 ENCOUNTER — Ambulatory Visit (INDEPENDENT_AMBULATORY_CARE_PROVIDER_SITE_OTHER): Payer: 59 | Admitting: Internal Medicine

## 2011-05-12 DIAGNOSIS — R319 Hematuria, unspecified: Secondary | ICD-10-CM

## 2011-05-12 DIAGNOSIS — R079 Chest pain, unspecified: Secondary | ICD-10-CM

## 2011-05-12 DIAGNOSIS — N3941 Urge incontinence: Secondary | ICD-10-CM

## 2011-05-12 DIAGNOSIS — M542 Cervicalgia: Secondary | ICD-10-CM

## 2011-05-12 LAB — POCT URINALYSIS DIPSTICK
Bilirubin, UA: NEGATIVE
Glucose, UA: NEGATIVE
Leukocytes, UA: NEGATIVE
Nitrite, UA: NEGATIVE

## 2011-05-12 MED ORDER — CYCLOBENZAPRINE HCL 10 MG PO TABS: 10.0000 mg | ORAL_TABLET | ORAL | Status: DC

## 2011-05-12 MED ORDER — CYCLOBENZAPRINE HCL 5 MG PO TABS: 5.0000 mg | ORAL_TABLET | ORAL | Status: DC

## 2011-05-12 MED ORDER — TRAMADOL HCL 50 MG PO TABS: 50.0000 mg | ORAL_TABLET | Freq: Four times a day (QID) | ORAL | Status: DC | PRN

## 2011-05-12 NOTE — Patient Instructions (Addendum)
Physical therapy will be ordered if the neck pain fails to respond to the pain medicine and muscle relaxants. Consider cervical pillow To prevent palpitations or premature beats, avoid stimulants such as decongestants, diet pills, nicotine, or caffeine (coffee, tea, cola, or chocolate) to excess.

## 2011-05-12 NOTE — Progress Notes (Signed)
Addended by: Edgardo Roys on: 05/12/2011 04:20 PM   Modules accepted: Orders

## 2011-05-12 NOTE — Progress Notes (Signed)
Addended byMarga Melnick F on: 05/12/2011 04:19 PM   Modules accepted: Orders

## 2011-05-12 NOTE — Progress Notes (Signed)
Subjective:    Patient ID: Laura Poole, female    DOB: 04/28/1951, 60 y.o.   MRN: [id number]  HPI NECK PAIN: Location: R posterior neck   Onset: 3 weeks ago  Severity: up to 10 Pain is described as: throbbing  Worse with: when supine    Better with: NSAIDS did not help  Pain radiates to: to R elbow   Impaired range of motion: no but pain with L lateral rotation History of repetitive motion:  no  History of overuse or hyperextension:  no  History of trauma:  no   Past history of similar problem:  no  Symptoms Back Pain:  yes, chronic LBS  Numbness/tingling:  yes, intermittently  Weakness:  no  Red Flags Fever:  no  Headache:  no  Bowel/bladder dysfunction:  yes, incontinence especially @ night; urgency during day; wearing pad ; Gyn appt 8/10       Review of Systems she denies dysuria, hematuria, or pyuria. She has no hesitancy or voiding issue other than that noted.PMH of bladder prolapse post TAH     Objective:   Physical Exam Gen.: Healthy and well-nourished in appearance. Alert, appropriate and cooperative throughout exam. Head: Normocephalic without obvious abnormalities Eyes: No corneal or conjunctival inflammation noted. Pupils equal round reactive to light and accommodation. FOV normal. Arcus senilis.EOMI. Vision grossly normal with lenses. Ears: External  ear exam reveals no significant lesions or deformities. Canals clear .TMs normal. Hearing is grossly normal bilaterally. Mouth: Oral mucosa and oropharynx reveal no lesions or exudates. Teeth in good repair. No tongue deviation Neck: No deformities, masses, or tenderness noted. Range of motion decreased due to pain. Thyroid normal. Lungs: Normal respiratory effort; chest expands symmetrically. Lungs are clear to auscultation without rales, wheezes, or increased work of breathing. Heart: Normal rate and rhythm. Normal S1 and S2. No gallop, click, or rub. No  murmur. Abdomen: Bowel sounds normal; abdomen soft and  nontender. No masses, organomegaly or hernias noted. No bladder distention.No flank tenderness                                                                                   Musculoskeletal/extremities: Lordosis  noted of  the thoracic. She lay down & sat up w/o help. No clubbing, cyanosis, edema, or deformity noted. Range of motion  normal .Tone & strength  normal.Joints normal. Nail health  good. Vascular: Carotid, radial artery, dorsalis pedis and  posterior tibial pulses are full and equal. No bruits present. Neurologic: Alert and oriented x3. Deep tendon reflexes symmetrical and normal.Gait; cranial nerves, Romberg & finger to nose normal.      Skin: Intact without suspicious lesions or rashes. Lymph: No cervical, axillary lymphadenopathy present. Psych: Mood and affect are normal. Normally interactive  Assessment & Plan:  #1 neck pain with radiation to the right elbow; cervical radiculopathy suggested  #2 urgency and nocturnal incontinence; past medical history of bladder prolapse  Plan: See orders and recommendations. Note: EKG : no ischemic changes ; 2 PACs

## 2011-05-13 ENCOUNTER — Other Ambulatory Visit: Payer: Self-pay | Admitting: Internal Medicine

## 2011-05-13 MED ORDER — TRIAMTERENE-HCTZ 37.5-25 MG PO CAPS: 1.0000 | ORAL_CAPSULE | Freq: Every day | ORAL | Status: DC

## 2011-05-13 NOTE — Telephone Encounter (Signed)
Rx sent to pharmacy   

## 2011-05-14 LAB — URINE CULTURE: Colony Count: 3000

## 2011-06-01 ENCOUNTER — Encounter: Payer: Self-pay | Admitting: *Deleted

## 2011-06-02 ENCOUNTER — Ambulatory Visit (INDEPENDENT_AMBULATORY_CARE_PROVIDER_SITE_OTHER): Payer: 59 | Admitting: Cardiology

## 2011-06-02 ENCOUNTER — Encounter: Payer: Self-pay | Admitting: Cardiology

## 2011-06-02 DIAGNOSIS — R0989 Other specified symptoms and signs involving the circulatory and respiratory systems: Secondary | ICD-10-CM

## 2011-06-02 DIAGNOSIS — I340 Nonrheumatic mitral (valve) insufficiency: Secondary | ICD-10-CM

## 2011-06-02 DIAGNOSIS — R079 Chest pain, unspecified: Secondary | ICD-10-CM

## 2011-06-02 DIAGNOSIS — E785 Hyperlipidemia, unspecified: Secondary | ICD-10-CM

## 2011-06-02 DIAGNOSIS — I059 Rheumatic mitral valve disease, unspecified: Secondary | ICD-10-CM

## 2011-06-02 DIAGNOSIS — R0609 Other forms of dyspnea: Secondary | ICD-10-CM

## 2011-06-02 DIAGNOSIS — I10 Essential (primary) hypertension: Secondary | ICD-10-CM

## 2011-06-02 DIAGNOSIS — R0600 Dyspnea, unspecified: Secondary | ICD-10-CM

## 2011-06-02 NOTE — Patient Instructions (Signed)
Schedule an appointment for an echocardiogram.  Schedule an appointment for fasting lab--lipid profile/liver profile/BMP/BNP 786.09  786.50--You can have this the day you have the echocardiogram.  Take and record your blood pressure every other day. Bring these readings to your appointment with Dr Shirlee Latch in 2 weeks.  Schedule an appointment to see Dr Shirlee Latch in 2 weeks.

## 2011-06-03 DIAGNOSIS — R0609 Other forms of dyspnea: Secondary | ICD-10-CM | POA: Insufficient documentation

## 2011-06-03 DIAGNOSIS — R079 Chest pain, unspecified: Secondary | ICD-10-CM | POA: Insufficient documentation

## 2011-06-03 DIAGNOSIS — R0600 Dyspnea, unspecified: Secondary | ICD-10-CM | POA: Insufficient documentation

## 2011-06-03 NOTE — Assessment & Plan Note (Signed)
Atypical chest pain, probably noncardiac.  Would continue ASA 81 mg daily given family health.

## 2011-06-03 NOTE — Assessment & Plan Note (Signed)
BP is upper normal.  Patient will check BP daily and show me the readings when I see her back in 2 wks.

## 2011-06-03 NOTE — Assessment & Plan Note (Signed)
I will check lipids/LFTs.  

## 2011-06-03 NOTE — Progress Notes (Signed)
PCP: Dr. Alwyn Ren  60 yo with history of breast cancer and atypical chest pain as well as exertional dyspnea presents for cardiology evaluation.  Patient has had a 2-3 year history of nonexertional chest pain.  This will come and go and is not related to exertion.  It may be stress-related.  Last myoview was in 4/11 and showed no evidence for ischemia.  Patient also has noted very significant dyspnea after walking 2 flights of steps and some dyspnea with 1 flight of steps.  This has been present for 6-7 months.  She can walk on flat ground without dyspnea.  She is not getting much exercise in general due to busy work schedule and busy home life.  She has also been short of breath when lying flat for the last 1-2 years.  No PND.   ECG: NSR, LAE  Labs (1/12): creatinine 0.93  PMH: 1. Breast cancer (2006): Treated with chemo and radiation. 2. GERD 3. Colon polyps 4. HTN 5. Hyperlipidemia 6. Atypical chest pain: Myoview (4/11) with EF 70%, breast attenuation, no ischemia.  7. Exertional dyspnea: Echo (2008) with EF 60%, moderate LV hypertrophy, MVP, mild MR  SH: Takes care of mother and 3 grandchildren.  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  Nonsmoker.   FH: Father with MI at 55, Brother with MI at 83.  ROS: All sysems reviewed and negative except as per HPI.   Current Outpatient Prescriptions  Medication Sig Dispense Refill  . amLODipine (NORVASC) 5 MG tablet Take 5 mg by mouth daily. In place of Verapamil       . aspirin 81 MG tablet Take 81 mg by mouth daily.        . Cholecalciferol (VITAMIN D3) 2000 UNITS capsule Take 2,000 Units by mouth daily.        [name] esomeprazole (NEXIUM) 40 MG capsule Take 40 mg by mouth 2 (two) times daily.        [name] labetalol (NORMODYNE) 300 MG tablet Take 300 mg by mouth 2 (two) times daily.        . rosuvastatin (CRESTOR) 10 MG tablet Take 10 mg by mouth at bedtime.        . tolterodine (DETROL LA) 2 MG 24 hr capsule Take 2 mg by mouth daily.        [name]  triamterene-hydrochlorothiazide (DYAZIDE) 37.5-25 MG per capsule Take 1 capsule by mouth daily.  90 capsule  2  . zolpidem (AMBIEN) 10 MG tablet Take 10 mg by mouth at bedtime as needed.          BP 138/90  Pulse 84  Resp 18  Ht 5\' 7"  (1.702 m)  Wt 181 lb 6.4 oz (82.283 kg)  BMI 28.41 kg/m2 General: NAD Neck: No JVD, no thyromegaly or thyroid nodule.  Lungs: Clear to auscultation bilaterally with normal respiratory effort. CV: Nondisplaced PMI.  Heart regular S1/S2, no S3/S4, 2/6 HSM at apex.  No peripheral edema.  No carotid bruit.  Normal pedal pulses.  Abdomen: Soft, nontender, no hepatosplenomegaly, no distention.  Skin: Intact without lesions or rashes.  Neurologic: Alert and oriented x 3.  Psych: Normal affect. Extremities: No clubbing or cyanosis.  HEENT: Normal.

## 2011-06-03 NOTE — Assessment & Plan Note (Addendum)
Patient has noted worsening exertional dyspnea x 6-7 months.  She has a history of MV prolapse with MR murmur on exam.  She does not appear volume overloaded on exam.  She did, of note, have a nonischemic myoview in 4/11.   - Echo to assess LV and RV function and to look for significant mitral regurgitation as a cause symptoms.  - Check BNP today - If echo is unrevealing, I will consider a coronary CT angiogram to rule out significant coronary disease.

## 2011-06-08 ENCOUNTER — Ambulatory Visit (HOSPITAL_COMMUNITY): Payer: 59 | Attending: Cardiology

## 2011-06-08 ENCOUNTER — Other Ambulatory Visit (INDEPENDENT_AMBULATORY_CARE_PROVIDER_SITE_OTHER): Payer: 59 | Admitting: *Deleted

## 2011-06-08 DIAGNOSIS — R0989 Other specified symptoms and signs involving the circulatory and respiratory systems: Secondary | ICD-10-CM

## 2011-06-08 DIAGNOSIS — I059 Rheumatic mitral valve disease, unspecified: Secondary | ICD-10-CM | POA: Insufficient documentation

## 2011-06-08 DIAGNOSIS — E785 Hyperlipidemia, unspecified: Secondary | ICD-10-CM | POA: Insufficient documentation

## 2011-06-08 DIAGNOSIS — I340 Nonrheumatic mitral (valve) insufficiency: Secondary | ICD-10-CM

## 2011-06-08 DIAGNOSIS — R079 Chest pain, unspecified: Secondary | ICD-10-CM

## 2011-06-08 DIAGNOSIS — I10 Essential (primary) hypertension: Secondary | ICD-10-CM | POA: Insufficient documentation

## 2011-06-08 DIAGNOSIS — R0609 Other forms of dyspnea: Secondary | ICD-10-CM

## 2011-06-08 DIAGNOSIS — K219 Gastro-esophageal reflux disease without esophagitis: Secondary | ICD-10-CM | POA: Insufficient documentation

## 2011-06-08 LAB — BRAIN NATRIURETIC PEPTIDE: Pro B Natriuretic peptide (BNP): 65 pg/mL (ref 0.0–100.0)

## 2011-06-08 LAB — LIPID PANEL
Cholesterol: 134 mg/dL (ref 0–200)
LDL Cholesterol: 63 mg/dL (ref 0–99)
Triglycerides: 43 mg/dL (ref 0.0–149.0)

## 2011-06-08 LAB — BASIC METABOLIC PANEL
BUN: 14 mg/dL (ref 6–23)
CO2: 28 mEq/L (ref 19–32)
Chloride: 108 mEq/L (ref 96–112)
Creatinine, Ser: 0.7 mg/dL (ref 0.4–1.2)
Glucose, Bld: 100 mg/dL — ABNORMAL HIGH (ref 70–99)
Potassium: 3.7 mEq/L (ref 3.5–5.1)

## 2011-06-10 ENCOUNTER — Encounter: Payer: Self-pay | Admitting: *Deleted

## 2011-06-10 ENCOUNTER — Telehealth: Payer: Self-pay | Admitting: Cardiology

## 2011-06-10 NOTE — Telephone Encounter (Signed)
Patient aware of labs and echo results, she verbalized understanding. Pt has a F/U visit on 06/23/11 at 4:00 PM.

## 2011-06-10 NOTE — Telephone Encounter (Signed)
Pt rtn call re echo and blood results

## 2011-06-16 ENCOUNTER — Other Ambulatory Visit: Payer: Self-pay | Admitting: Obstetrics and Gynecology

## 2011-06-16 ENCOUNTER — Other Ambulatory Visit: Payer: Self-pay | Admitting: Oncology

## 2011-06-16 DIAGNOSIS — Z853 Personal history of malignant neoplasm of breast: Secondary | ICD-10-CM

## 2011-06-23 ENCOUNTER — Ambulatory Visit (INDEPENDENT_AMBULATORY_CARE_PROVIDER_SITE_OTHER): Payer: 59 | Admitting: Cardiology

## 2011-06-23 ENCOUNTER — Encounter: Payer: Self-pay | Admitting: Cardiology

## 2011-06-23 DIAGNOSIS — R0600 Dyspnea, unspecified: Secondary | ICD-10-CM

## 2011-06-23 DIAGNOSIS — I10 Essential (primary) hypertension: Secondary | ICD-10-CM

## 2011-06-23 DIAGNOSIS — R0989 Other specified symptoms and signs involving the circulatory and respiratory systems: Secondary | ICD-10-CM

## 2011-06-23 DIAGNOSIS — R0609 Other forms of dyspnea: Secondary | ICD-10-CM

## 2011-06-23 MED ORDER — AMLODIPINE BESYLATE 10 MG PO TABS: 10.0000 mg | ORAL_TABLET | Freq: Every day | ORAL | Status: DC

## 2011-06-23 MED ORDER — ASPIRIN 81 MG PO TABS: 81.0000 mg | ORAL_TABLET | Freq: Every day | ORAL | Status: AC

## 2011-06-23 NOTE — Assessment & Plan Note (Addendum)
I do not think that the moderate MR seen on echo is causing symptoms.  She certainly could have a component of diastolic CHF in the setting of poorly controlled HTN.  She had a negative myoview in 2011, so I think that ischemia is unlikely.  Finally, deconditioning could play a large role in dyspnea.  The onset of dyspnea has coincided with a period of stress and increased work hours.  She is getting very little exercise.   - She will try to increase to 20-30 minutes of aerobic exercise daily.  - If exertional symptoms worsen, would consider coronary CT angiogram.

## 2011-06-23 NOTE — Assessment & Plan Note (Signed)
BP running high, needs better control.  Increase amlodipine to 10 mg daily.

## 2011-06-23 NOTE — Progress Notes (Signed)
PCP: Dr. Alwyn Ren  60 yo with history of breast cancer and atypical chest pain as well as exertional dyspnea returns for cardiology evaluation.  Patient has had a 2-3 year history of nonexertional chest pain.  This will come and go.  It may be stress-related.  Last myoview was in 4/11 and showed no evidence for ischemia.  Patient also has noted very significant dyspnea after walking 2 flights of steps and some dyspnea with 1 flight of steps.  This has been present for 6-7 months.  She can walk on flat ground without dyspnea.  She is not getting much exercise in general due to busy work schedule and busy home life.  She has also been short of breath when lying flat for the last 1-2 years.  No PND.   I had her do an echo, which showed EF 65-70% with moderate mitral regurgitation and mild diastolic dysfunction.  BNP was not significantly elevated. Blood pressure has been running high at home and here.   Labs (1/12): creatinine 0.93 Labs (8/12): K 3.7, creatinine 0.7, BNP 65, LDL 63, HDL 63  PMH: 1. Breast cancer (2006): Treated with chemo and radiation. 2. GERD 3. Colon polyps 4. HTN 5. Hyperlipidemia 6. Atypical chest pain: Myoview (4/11) with EF 70%, breast attenuation, no ischemia.  7. Exertional dyspnea: Echo (2008) with EF 60%, moderate LV hypertrophy, MVP, mild MR.  Echo (8/12): EF 60-65%, mild LV hypertrophy, grade I diastolic dysfunction, moderate MR.   SH: Takes care of mother and 3 grandchildren.  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   FH: Father with MI at 3, Brother with MI at 49.  ROS: All sysems reviewed and negative except as per HPI.   Current Outpatient Prescriptions  Medication Sig Dispense Refill  . aspirin 81 MG tablet Take 81 mg by mouth daily.        . Cholecalciferol (VITAMIN D3) 2000 UNITS capsule Take 2,000 Units by mouth daily.        [name] esomeprazole (NEXIUM) 40 MG capsule Take 40 mg by mouth 2 (two) times daily.        [name] labetalol (NORMODYNE) 300 MG tablet Take  300 mg by mouth 2 (two) times daily.        . Omega-3 Fatty Acids (FISH OIL) 1000 MG CAPS Take by mouth daily.        . rosuvastatin (CRESTOR) 10 MG tablet Take 10 mg by mouth at bedtime.        . tolterodine (DETROL LA) 2 MG 24 hr capsule Take 2 mg by mouth daily.        [name] triamterene-hydrochlorothiazide (DYAZIDE) 37.5-25 MG per capsule Take 1 capsule by mouth daily.  90 capsule  2  . zolpidem (AMBIEN) 10 MG tablet Take 10 mg by mouth at bedtime as needed.        [name] DISCONTD: amLODipine (NORVASC) 5 MG tablet Take 5 mg by mouth daily. In place of Verapamil       . amLODipine (NORVASC) 10 MG tablet Take 1 tablet (10 mg total) by mouth daily.  90 tablet  3    BP 140/98  Pulse 76  Ht 5\' 7"  (1.702 m)  Wt 178 lb (80.74 kg)  BMI 27.88 kg/m2 General: NAD Neck: No JVD, no thyromegaly or thyroid nodule.  Lungs: Clear to auscultation bilaterally with normal respiratory effort. CV: Nondisplaced PMI.  Heart regular S1/S2, no S3/S4, 1/6 HSM at apex.  No peripheral edema.  No carotid bruit.  Normal  pedal pulses.  Abdomen: Soft, nontender, no hepatosplenomegaly, no distention.  Neurologic: Alert and oriented x 3.  Psych: Normal affect. Extremities: No clubbing or cyanosis.

## 2011-06-23 NOTE — Patient Instructions (Signed)
Increase amlodipine to 10mg  daily.  Your physician wants you to follow-up in: 6 months with Dr Shirlee Latch. (March 2013). You will receive a reminder letter in the mail two months in advance. If you don't receive a letter, please call our office to schedule the follow-up appointment.

## 2011-06-25 ENCOUNTER — Ambulatory Visit
Admission: RE | Admit: 2011-06-25 | Discharge: 2011-06-25 | Disposition: A | Discharge status: Home / Self Care | Payer: 59 | Source: Ambulatory Visit | Attending: Obstetrics and Gynecology | Admitting: Obstetrics and Gynecology

## 2011-06-25 DIAGNOSIS — Z853 Personal history of malignant neoplasm of breast: Secondary | ICD-10-CM

## 2011-07-27 LAB — CREATININE, SERUM
Creatinine, Ser: 1.01
GFR calc non Af Amer: 57 — ABNORMAL LOW

## 2011-09-06 ENCOUNTER — Other Ambulatory Visit: Payer: Self-pay | Admitting: Obstetrics and Gynecology

## 2011-09-06 DIAGNOSIS — Z853 Personal history of malignant neoplasm of breast: Secondary | ICD-10-CM

## 2011-09-23 ENCOUNTER — Other Ambulatory Visit: Payer: Self-pay | Admitting: Internal Medicine

## 2011-10-20 ENCOUNTER — Encounter: Payer: Self-pay | Admitting: Physician Assistant

## 2011-10-20 ENCOUNTER — Ambulatory Visit (INDEPENDENT_AMBULATORY_CARE_PROVIDER_SITE_OTHER): Payer: 59 | Admitting: Physician Assistant

## 2011-10-20 DIAGNOSIS — R531 Weakness: Secondary | ICD-10-CM | POA: Insufficient documentation

## 2011-10-20 DIAGNOSIS — I10 Essential (primary) hypertension: Secondary | ICD-10-CM

## 2011-10-20 DIAGNOSIS — M79604 Pain in right leg: Secondary | ICD-10-CM

## 2011-10-20 DIAGNOSIS — R0989 Other specified symptoms and signs involving the circulatory and respiratory systems: Secondary | ICD-10-CM

## 2011-10-20 DIAGNOSIS — M79609 Pain in unspecified limb: Secondary | ICD-10-CM

## 2011-10-20 DIAGNOSIS — M6281 Muscle weakness (generalized): Secondary | ICD-10-CM

## 2011-10-20 NOTE — Assessment & Plan Note (Signed)
Better controlled 

## 2011-10-20 NOTE — Assessment & Plan Note (Signed)
Exam normal.  Check carotid dopplers.  If workup negative, follow up with PCP.  She may need further testing.

## 2011-10-20 NOTE — Patient Instructions (Addendum)
Your physician recommends that you schedule a follow-up appointment in: March with Dr Shirlee Latch Your physician recommends that you return for lab work in: today (BMP, CBC, TSH)  Your physician has requested that you have a carotid duplex. This test is an ultrasound of the carotid arteries in your neck. It looks at blood flow through these arteries that supply the brain with blood. Allow one hour for this exam. There are no restrictions or special instructions.  Your physician has requested that you have an ankle brachial index (ABI). During this test an ultrasound and blood pressure cuff are used to evaluate the arteries that supply the arms and legs with blood. Allow thirty minutes for this exam. There are no restrictions or special instructions.

## 2011-10-20 NOTE — Assessment & Plan Note (Signed)
Check carotid dopplers 

## 2011-10-20 NOTE — Progress Notes (Signed)
[address] [place], [place]  [address] Phone: [phone] Fax:  [phone]  Date:  10/20/2011   Name:  Laura Poole       DOB:  1951-01-31 MRN:  [id number]  PCP:  Dr. Alwyn Ren Primary Cardiologist:  Dr. Marca Ancona  Primary Electrophysiologist:  None    History of Present Illness: Laura Poole is a 61 y.o. female who presents for leg pain.  She has a history of breast cancer and atypical chest pain as well as exertional dyspnea. Patient has had a 2-3 year history of nonexertional chest pain. This will come and go. It may be stress-related. Last myoview was in 4/11 and showed no evidence for ischemia. Patient also has noted very significant dyspnea after walking 2 flights of steps and some dyspnea with 1 flight of steps present for many months.  Echo showed EF 65-70% with moderate mitral regurgitation and mild diastolic dysfunction.  BNP was not significantly elevated.  She was last seen by Dr. Marca Ancona 9/12.  It was not felt that moderate MR was causing any of her symptoms.  He recommended increasing activity.  He suggested considering a coronary CT angiogram if her exertional symptoms should worsen.  Amlodipine was adjusted for blood pressure.  Plan was for 6 month followup.  She awoke from sleep a couple weeks ago with severe bilateral leg pain.  She felt weak on her right side.  She screamed out in pain.  She felt like she could not move.  She had symptoms for about 15 minutes.  She has noted right leg pain since.  She may notice increased pain with exertion. No sores on her feet.  No edema.  Dyspnea is about the same.  Chest pain is possibly a little better.  No syncope.  No facial droop, difficulty with speech, amaurosis fugax.  Leg pain is constant every day.  She sleeps on 2-3 pillows chronically.  No PND.  No edema.    Past Medical History:  1. Breast cancer (2006): Treated with chemo and radiation.  2. GERD  3. Colon polyps  4. HTN  5.  Hyperlipidemia  6. Atypical chest pain: Myoview (4/11) with EF 70%, breast attenuation, no ischemia.  7. Exertional dyspnea: Echo (2008) with EF 60%, moderate LV hypertrophy, MVP, mild MR. Echo (8/12): EF 60-65%, mild LV hypertrophy, grade I diastolic dysfunction, moderate MR.    Current Outpatient Prescriptions  Medication Sig Dispense Refill  . amLODipine (NORVASC) 10 MG tablet Take 1 tablet (10 mg total) by mouth daily.  90 tablet  3  . aspirin 81 MG tablet Take 1 tablet (81 mg total) by mouth daily.  30 tablet  6  . Cholecalciferol (VITAMIN D3) 2000 UNITS capsule Take 2,000 Units by mouth daily.        [name] esomeprazole (NEXIUM) 40 MG capsule Take 40 mg by mouth 2 (two) times daily.        [name] labetalol (NORMODYNE) 300 MG tablet TAKE 1 TABLET BY MOUTH TWICE DAILY  180 tablet  2  . Omega-3 Fatty Acids (FISH OIL) 1000 MG CAPS Take by mouth daily.        . rosuvastatin (CRESTOR) 10 MG tablet Take 10 mg by mouth at bedtime.        . tolterodine (DETROL LA) 2 MG 24 hr capsule Take 2 mg by mouth daily.        [name] triamterene-hydrochlorothiazide (DYAZIDE) 37.5-25 MG per capsule Take 1 capsule by mouth daily.  90  capsule  2  . zolpidem (AMBIEN) 10 MG tablet Take 10 mg by mouth at bedtime as needed.          Allergies: No Known Allergies  History  Substance Use Topics  . Smoking status: Never Smoker   . Smokeless tobacco: Not on file  . Alcohol Use: 0.0 oz/week     rarely     ROS:  Please see the history of present illness.   No fevers, chills, cough, melena, hematochezia, arthralgias, weight loss.  All other systems reviewed and negative.   PHYSICAL EXAM: VS:  BP 110/66  Pulse 83  Ht 5\' 7"  (1.702 m)  Wt 179 lb (81.194 kg)  BMI 28.04 kg/m2 Well nourished, well developed, in no acute distress HEENT: normal Neck: no JVD Vascular: ? bilat carotid bruits; FA 2+ bilat, no bruits, DP/PT 1-2+ bilat Cardiac:  normal S1, S2; RRR; 1/6 systolic murmur LLSB Lungs:  clear to auscultation  bilaterally, no wheezing, rhonchi or rales Abd: soft, nontender, no hepatomegaly Ext: no edema; calves soft and nontender without palpable cords. Skin: warm and dry; no ulcers on feet Neuro:  CNs 2-12 intact, no focal abnormalities noted; strength normal and equal in all extremities; babinski downgoing bilat. Psych: normal affect  EKG:  Sinus rhythm, heart rate 83, normal axis, poor R-wave progression, nonspecific ST-T wave changes, no change from prior  ASSESSMENT AND PLAN:

## 2011-10-20 NOTE — Assessment & Plan Note (Signed)
Atypical for claudication.  She may have been describing leg cramping.  She does take Dyazide.  I do not see any signs of DVT.  She continues to have right-sided weakness.  However, on exam her strength is normal. 1. Check ABIs 2. Labs: Basic metabolic panel, CBC, TSH 3. Order carotid Dopplers as noted. 4. If current workup negative, I recommend she followup with her PCP.   5. Robaxin 500 mg 1-2 QD prn.

## 2011-10-21 LAB — CBC WITH DIFFERENTIAL/PLATELET
Basophils Relative: 0.7 % (ref 0.0–3.0)
Eosinophils Absolute: 0.1 10*3/uL (ref 0.0–0.7)
Lymphocytes Relative: 33.1 % (ref 12.0–46.0)
MCHC: 33.2 g/dL (ref 30.0–36.0)
MCV: 90.2 fl (ref 78.0–100.0)
Monocytes Absolute: 0.7 10*3/uL (ref 0.1–1.0)
Neutrophils Relative %: 53.3 % (ref 43.0–77.0)
Platelets: 267 10*3/uL (ref 150.0–400.0)
RBC: 3.94 Mil/uL (ref 3.87–5.11)
WBC: 6.1 10*3/uL (ref 4.5–10.5)

## 2011-10-21 LAB — BASIC METABOLIC PANEL
BUN: 18 mg/dL (ref 6–23)
Calcium: 9.3 mg/dL (ref 8.4–10.5)
Chloride: 102 mEq/L (ref 96–112)
Creatinine, Ser: 1 mg/dL (ref 0.4–1.2)

## 2011-10-21 LAB — TSH: TSH: 0.46 u[IU]/mL (ref 0.35–5.50)

## 2011-10-22 ENCOUNTER — Telehealth: Payer: Self-pay | Admitting: *Deleted

## 2011-10-22 DIAGNOSIS — E876 Hypokalemia: Secondary | ICD-10-CM

## 2011-10-22 MED ORDER — POTASSIUM CHLORIDE CRYS ER 10 MEQ PO TBCR: 10.0000 meq | EXTENDED_RELEASE_TABLET | Freq: Every day | ORAL | Status: DC

## 2011-10-22 NOTE — Telephone Encounter (Signed)
msg called with request to call back confirmation. bmet ordered / script sent/ lab draw set up/

## 2011-10-22 NOTE — Telephone Encounter (Signed)
Message copied by Antony Odea on Fri Oct 22, 2011  9:32 AM ------      Message from: [place], [place] T      Created: Thu Oct 21, 2011  5:51 PM       K+ low      Add K+ 10 mEq QD      Repeat bmet in one week.      TSH normal      Mildly anemic.      Follow up with PCP for further evaluation.      Tereso Newcomer, PA-C  5:51 PM 10/21/2011

## 2011-10-25 ENCOUNTER — Telehealth: Payer: Self-pay | Admitting: Cardiology

## 2011-10-25 NOTE — Telephone Encounter (Signed)
Pt rtn call re potassium level and re medication, pls call

## 2011-10-25 NOTE — Telephone Encounter (Signed)
I talked pt. Pt is aware of lab results done 10/20/11. See phone note dated 10/21/11. Pt aware to start KCL 10 mEq daily and return for repeat BMET 10/29/11.

## 2011-10-25 NOTE — Telephone Encounter (Signed)
Phone note dated 10/22/11 not 10/21/11.

## 2011-10-29 ENCOUNTER — Other Ambulatory Visit: Payer: 59 | Admitting: *Deleted

## 2011-11-01 ENCOUNTER — Other Ambulatory Visit (INDEPENDENT_AMBULATORY_CARE_PROVIDER_SITE_OTHER): Payer: 59 | Admitting: *Deleted

## 2011-11-01 DIAGNOSIS — E876 Hypokalemia: Secondary | ICD-10-CM

## 2011-11-02 LAB — BASIC METABOLIC PANEL
Calcium: 9.1 mg/dL (ref 8.4–10.5)
GFR: 81.5 mL/min (ref >60.00)
Glucose, Bld: 91 mg/dL (ref 70–99)
Potassium: 4 mEq/L (ref 3.5–5.1)
Sodium: 141 mEq/L (ref 135–145)

## 2011-11-03 ENCOUNTER — Telehealth: Payer: Self-pay | Admitting: Cardiology

## 2011-11-03 NOTE — Telephone Encounter (Signed)
FU Call: pt calling wanting to know if pt needs to continue taking potassium pills. Please return pt call to discuss further.

## 2011-11-03 NOTE — Telephone Encounter (Signed)
Per Tereso Newcomer, pt should stay on potassium while she is on diuretic.  Pt informed.

## 2011-11-24 ENCOUNTER — Encounter: Payer: 59 | Admitting: Cardiology

## 2011-11-24 ENCOUNTER — Encounter (INDEPENDENT_AMBULATORY_CARE_PROVIDER_SITE_OTHER): Payer: 59 | Admitting: Cardiology

## 2011-11-24 DIAGNOSIS — I6529 Occlusion and stenosis of unspecified carotid artery: Secondary | ICD-10-CM

## 2011-11-24 DIAGNOSIS — R0989 Other specified symptoms and signs involving the circulatory and respiratory systems: Secondary | ICD-10-CM

## 2011-11-25 ENCOUNTER — Encounter: Payer: 59 | Admitting: Cardiology

## 2011-11-25 ENCOUNTER — Encounter (INDEPENDENT_AMBULATORY_CARE_PROVIDER_SITE_OTHER): Payer: 59 | Admitting: Cardiology

## 2011-11-25 DIAGNOSIS — I739 Peripheral vascular disease, unspecified: Secondary | ICD-10-CM

## 2011-11-25 DIAGNOSIS — M79604 Pain in right leg: Secondary | ICD-10-CM

## 2011-11-29 ENCOUNTER — Telehealth: Payer: Self-pay | Admitting: *Deleted

## 2011-11-29 ENCOUNTER — Other Ambulatory Visit: Payer: Self-pay | Admitting: Physician Assistant

## 2011-11-29 DIAGNOSIS — R531 Weakness: Secondary | ICD-10-CM

## 2011-11-29 DIAGNOSIS — R0989 Other specified symptoms and signs involving the circulatory and respiratory systems: Secondary | ICD-10-CM

## 2011-11-29 NOTE — Telephone Encounter (Signed)
lmom to discuss carotid results and recommendations per Tereso Newcomer, PA-C. Danielle Rankin

## 2011-11-29 NOTE — Telephone Encounter (Signed)
Message copied by Tarri Fuller on Mon Nov 29, 2011 11:10 AM ------      Message from: [place], [place] T      Created: Sun Nov 28, 2011  1:50 PM       RICA 40-59%      LICA 60-79%      Repeat dopplers in 6 mos.            She complained of right sided weakness at her office visit and there was a ? Bruit -  which prompted the dopplers.      Since she has moderate disease on the left, I want her to see neurology.      Make sure she continues to take ASA.      Refer to neurology - Dr. Modesto Charon if he is available.      Tereso Newcomer, PA-C  1:49 PM 11/28/2011

## 2011-11-29 NOTE — Telephone Encounter (Signed)
Pt calling results of carotid  [phone]

## 2011-11-29 NOTE — Telephone Encounter (Signed)
lmom ptcb to discuss test results and recommendations from [facility], PA-C. Danielle Rankin

## 2011-12-08 ENCOUNTER — Encounter: Payer: Self-pay | Admitting: Neurology

## 2011-12-20 ENCOUNTER — Other Ambulatory Visit: Payer: Self-pay | Admitting: Internal Medicine

## 2011-12-20 NOTE — Telephone Encounter (Signed)
Prescription sent to pharmacy.

## 2012-01-19 ENCOUNTER — Ambulatory Visit: Payer: 59 | Admitting: Cardiology

## 2012-01-20 ENCOUNTER — Encounter: Payer: Self-pay | Admitting: *Deleted

## 2012-01-20 ENCOUNTER — Ambulatory Visit (HOSPITAL_COMMUNITY)
Admission: RE | Admit: 2012-01-20 | Discharge: 2012-01-20 | Disposition: A | Discharge status: Home / Self Care | Payer: 59 | Source: Ambulatory Visit | Attending: Cardiology | Admitting: Cardiology

## 2012-01-20 ENCOUNTER — Encounter: Payer: Self-pay | Admitting: Cardiology

## 2012-01-20 ENCOUNTER — Ambulatory Visit (INDEPENDENT_AMBULATORY_CARE_PROVIDER_SITE_OTHER): Payer: 59 | Admitting: Cardiology

## 2012-01-20 DIAGNOSIS — I10 Essential (primary) hypertension: Secondary | ICD-10-CM

## 2012-01-20 DIAGNOSIS — R079 Chest pain, unspecified: Secondary | ICD-10-CM

## 2012-01-20 DIAGNOSIS — R0600 Dyspnea, unspecified: Secondary | ICD-10-CM

## 2012-01-20 DIAGNOSIS — R0609 Other forms of dyspnea: Secondary | ICD-10-CM

## 2012-01-20 DIAGNOSIS — E785 Hyperlipidemia, unspecified: Secondary | ICD-10-CM

## 2012-01-20 DIAGNOSIS — R0989 Other specified symptoms and signs involving the circulatory and respiratory systems: Secondary | ICD-10-CM

## 2012-01-20 LAB — BASIC METABOLIC PANEL
CO2: 28 mEq/L (ref 19–32)
Chloride: 104 mEq/L (ref 96–112)
Potassium: 3.4 mEq/L — ABNORMAL LOW (ref 3.5–5.1)
Sodium: 140 mEq/L (ref 135–145)

## 2012-01-20 MED ORDER — NITROGLYCERIN 0.4 MG SL SUBL
SUBLINGUAL_TABLET | SUBLINGUAL | Status: AC
  Administered 2012-01-20: 16:00:00
  Filled 2012-01-20: qty 25

## 2012-01-20 MED ORDER — METOPROLOL TARTRATE 1 MG/ML IV SOLN
5.0000 mg | INTRAVENOUS | Status: AC | PRN
  Administered 2012-01-20 (×2): 5 mg via INTRAVENOUS

## 2012-01-20 MED ORDER — METOPROLOL TARTRATE 1 MG/ML IV SOLN
INTRAVENOUS | Status: AC
  Filled 2012-01-20: qty 15

## 2012-01-20 MED ORDER — METOPROLOL TARTRATE 1 MG/ML IV SOLN
5.0000 mg | INTRAVENOUS | Status: AC | PRN
  Administered 2012-01-20: 5 mg via INTRAVENOUS

## 2012-01-20 MED ORDER — NITROGLYCERIN 0.4 MG SL SUBL: 0.4000 mg | SUBLINGUAL_TABLET | SUBLINGUAL | Status: DC | PRN

## 2012-01-20 MED ORDER — METOPROLOL TARTRATE 1 MG/ML IV SOLN
5.0000 mg | INTRAVENOUS | Status: AC | PRN
  Administered 2012-01-20 (×3): 5 mg via INTRAVENOUS

## 2012-01-20 MED ORDER — METOPROLOL TARTRATE 1 MG/ML IV SOLN
INTRAVENOUS | Status: AC
  Filled 2012-01-20: qty 10

## 2012-01-20 MED ORDER — METOPROLOL TARTRATE 1 MG/ML IV SOLN
INTRAVENOUS | Status: AC
  Filled 2012-01-20: qty 5

## 2012-01-20 MED ORDER — IOHEXOL 350 MG/ML SOLN
100.0000 mL | Freq: Once | INTRAVENOUS | Status: AC | PRN
  Administered 2012-01-20: 100 mL via INTRAVENOUS

## 2012-01-20 NOTE — Assessment & Plan Note (Signed)
BP is under control. ° °

## 2012-01-20 NOTE — Assessment & Plan Note (Signed)
Improved.  She does have moderate mitral regurgitation that will need ongoing monitoring.

## 2012-01-20 NOTE — Assessment & Plan Note (Signed)
Laura Poole has a history of atypical CP and had a negative myoview in 2011.  She has known carotid stenosis so is at risk for coronary disease.  She has been having increasing chest pain over the last few weeks, several episodes a day.  Though episodes are not exertional, I am concerned about this new pattern in someone at high risk for CAD.   - I will arrange a coronary CT angiogram today.  If there is significant disease, she will need a cath.  - Continue ASA, statin, beta blocker.

## 2012-01-20 NOTE — Patient Instructions (Signed)
Your physician recommends that you have lab work today--BMET/Lipid profile STAT.  Your physician has requested that you have cardiac CT. Cardiac computed tomography (CT) is a painless test that uses an x-ray machine to take clear, detailed pictures of your heart. For further information please visit [email or web address] Please follow instruction sheet as given. Today  Your physician recommends that you schedule a follow-up appointment in: 3 weeks with Dr Shirlee Latch.

## 2012-01-20 NOTE — Assessment & Plan Note (Signed)
Check lipids/LFTs today, goal LDL < 70.  

## 2012-01-20 NOTE — Assessment & Plan Note (Signed)
Bilateral moderate carotid stenosis, L > R.  She has been having periodic paresthesias in her right arm and had an episode where she could not move either leg for 10-15 minutes.  I am not sure that either of these symptoms are referrable to her carotid disease.  She is going to see neurology.  If symptoms are not thought to be related to carotid disease, will repeat dopplers in 8/13.

## 2012-01-20 NOTE — Progress Notes (Signed)
Patient ID: Laura Poole, female   DOB: 19-Apr-1951, 61 y.o.   MRN: [id number] PCP: Dr. Alwyn Ren  61 yo with history of breast cancer and atypical chest pain as well as exertional dyspnea returns for cardiology evaluation.  Patient has had a 3+ year history of nonexertional chest pain.  Last myoview was in 4/11 and showed no evidence for ischemia.  Echo in 8/12 showed EF 65-70% with moderate mitral regurgitation and mild diastolic dysfunction.  BNP was not significantly elevated.   Since I last saw her, she has been having episodes of tingling throughout her right arm that will last for a few minutes then resolve.  She saw the PA in our office and carotid bruit was heard.  She had carotid dopplers, showing 60-79% LICA stenosis and 40-59% RICA stenosis.  About 10 days ago, she woke up in the morning and was unable to move either leg for 10-15 minutes.  This has not happened before or since.  She has an appointment to see Dr. Modesto Charon with neurology next week.   She reported significant exertional dyspnea when I last saw her last, but recently this has actually improved.  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    Laura Poole tells me that she has been getting more frequent chest symptoms.  For the last 6 wks, she has been having multiple episodes of "indigestion" daily.  She will get burning/discomfort in her central chest that will last 5-10 minutes at a time.  There is no trigger.  It is not exertional.  It is not clearly related to meals.  She has been diagnosed with GERD in the past and has Nexium but is not taking it.  Sometimes the symptoms are relieved by belching.    Labs (1/12): creatinine 0.93 Labs (8/12): K 3.7, creatinine 0.7, BNP 65, LDL 63, HDL 63 Labs (1/13): K 4, creatinine 0.9  PMH: 1. Breast cancer (2006): Treated with chemo and radiation. 2. GERD 3. Colon polyps 4. HTN 5. Hyperlipidemia 6. Atypical chest pain: Myoview  (4/11) with EF 70%, breast attenuation, no ischemia.  7. Exertional dyspnea: Echo (2008) with EF 60%, moderate LV hypertrophy, MVP, mild MR.  Echo (8/12): EF 60-65%, mild LV hypertrophy, grade I diastolic dysfunction, moderate MR.  8. Carotid stenosis: 2/13 carotids with 40-59% RICA, 60-79% LICA.   9. ABIs 2/13 were normal.   SH: Takes care of mother and 3 grandchildren.  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  Nonsmoker.   FH: Father with MI at 62, Brother with MI at 40.  ROS: All sysems reviewed and negative except as per HPI.   Current Outpatient Prescriptions  Medication Sig Dispense Refill  . amLODipine (NORVASC) 10 MG tablet Take 1 tablet (10 mg total) by mouth daily.  90 tablet  3  . aspirin 81 MG tablet Take 1 tablet (81 mg total) by mouth daily.  30 tablet  6  . Cholecalciferol (VITAMIN D3) 2000 UNITS capsule Take 2,000 Units by mouth daily.        . CRESTOR 10 MG tablet TAKE 1 TABLET BY MOUTH AT BEDTIME  90 tablet  1  . esomeprazole (NEXIUM) 40 MG capsule Take 40 mg by mouth 2 (two) times daily.        [name] labetalol (NORMODYNE) 300 MG tablet TAKE 1 TABLET BY MOUTH TWICE DAILY  180 tablet  2  . Omega-3 Fatty Acids (FISH OIL) 1000 MG CAPS Take by mouth  daily.        . potassium chloride (K-DUR,KLOR-CON) 10 MEQ tablet Take 1 tablet (10 mEq total) by mouth daily.  30 tablet  5  . tolterodine (DETROL LA) 2 MG 24 hr capsule Take 2 mg by mouth as needed.       . triamterene-hydrochlorothiazide (DYAZIDE) 37.5-25 MG per capsule Take 1 capsule by mouth daily.  90 capsule  2  . zolpidem (AMBIEN) 10 MG tablet Take 10 mg by mouth daily as needed.         BP 138/88  Pulse 82  Ht 5' 7.5" (1.715 m)  Wt 177 lb 12.8 oz (80.65 kg)  BMI 27.44 kg/m2 General: NAD Neck: No JVD, no thyromegaly or thyroid nodule.  Lungs: Clear to auscultation bilaterally with normal respiratory effort. CV: Nondisplaced PMI.  Heart regular S1/S2, no S3/S4, 1/6 HSM at apex.  No peripheral edema.  Soft bilateral carotid bruits.   Difficult to palpate pedal pulses.  Abdomen: Soft, nontender, no hepatosplenomegaly, no distention.  Neurologic: Alert and oriented x 3.  Psych: Normal affect. Extremities: No clubbing or cyanosis.

## 2012-01-21 ENCOUNTER — Telehealth: Payer: Self-pay | Admitting: *Deleted

## 2012-01-21 MED ORDER — POTASSIUM CHLORIDE CRYS ER 10 MEQ PO TBCR: 10.0000 meq | EXTENDED_RELEASE_TABLET | Freq: Two times a day (BID) | ORAL | Status: DC

## 2012-01-21 NOTE — Telephone Encounter (Signed)
K 3.4 01/20/12--Per Dr Shirlee Latch. Increase KCL to 10 mEq bid. Pt notified.

## 2012-01-24 ENCOUNTER — Encounter: Payer: Self-pay | Admitting: *Deleted

## 2012-01-24 ENCOUNTER — Telehealth: Payer: Self-pay | Admitting: Cardiology

## 2012-01-24 NOTE — Telephone Encounter (Signed)
Patient returning nurse AL call, she can be reached  On mobile# [phone]

## 2012-01-24 NOTE — Telephone Encounter (Signed)
Spoke with pt. Pt continues to have indigestion/chest pain. Reviewed with Dr Shirlee Latch. Will schedule for [facility] 01/28/12.

## 2012-01-25 ENCOUNTER — Other Ambulatory Visit (INDEPENDENT_AMBULATORY_CARE_PROVIDER_SITE_OTHER): Payer: 59

## 2012-01-25 ENCOUNTER — Other Ambulatory Visit: Payer: Self-pay | Admitting: Cardiology

## 2012-01-25 DIAGNOSIS — R079 Chest pain, unspecified: Secondary | ICD-10-CM

## 2012-01-26 LAB — BASIC METABOLIC PANEL
BUN: 14 mg/dL (ref 6–23)
CO2: 29 mEq/L (ref 19–32)
Chloride: 106 mEq/L (ref 96–112)
Glucose, Bld: 95 mg/dL (ref 70–99)
Potassium: 3.9 mEq/L (ref 3.5–5.1)
Sodium: 141 mEq/L (ref 135–145)

## 2012-01-26 LAB — CBC WITH DIFFERENTIAL/PLATELET
Basophils Relative: 0.5 % (ref 0.0–3.0)
Eosinophils Relative: 4.1 % (ref 0.0–5.0)
Monocytes Relative: 12.1 % — ABNORMAL HIGH (ref 3.0–12.0)
Neutrophils Relative %: 40.3 % — ABNORMAL LOW (ref 43.0–77.0)
Platelets: 249 10*3/uL (ref 150.0–400.0)
RBC: 3.91 Mil/uL (ref 3.87–5.11)
WBC: 4.8 10*3/uL (ref 4.5–10.5)

## 2012-01-26 LAB — PROTIME-INR: INR: 1.1 ratio — ABNORMAL HIGH (ref 0.8–1.0)

## 2012-01-28 ENCOUNTER — Encounter (HOSPITAL_BASED_OUTPATIENT_CLINIC_OR_DEPARTMENT_OTHER)
Admission: RE | Disposition: A | Discharge status: Home / Self Care | Payer: Self-pay | Source: Ambulatory Visit | Attending: Cardiology

## 2012-01-28 ENCOUNTER — Ambulatory Visit: Payer: 59 | Admitting: Neurology

## 2012-01-28 ENCOUNTER — Inpatient Hospital Stay (HOSPITAL_BASED_OUTPATIENT_CLINIC_OR_DEPARTMENT_OTHER)
Admission: RE | Admit: 2012-01-28 | Discharge: 2012-01-28 | Disposition: A | Discharge status: Home / Self Care | Payer: 59 | Source: Ambulatory Visit | Attending: Cardiology | Admitting: Cardiology

## 2012-01-28 DIAGNOSIS — I10 Essential (primary) hypertension: Secondary | ICD-10-CM | POA: Insufficient documentation

## 2012-01-28 DIAGNOSIS — Z853 Personal history of malignant neoplasm of breast: Secondary | ICD-10-CM | POA: Insufficient documentation

## 2012-01-28 DIAGNOSIS — Z923 Personal history of irradiation: Secondary | ICD-10-CM | POA: Insufficient documentation

## 2012-01-28 DIAGNOSIS — R079 Chest pain, unspecified: Secondary | ICD-10-CM

## 2012-01-28 DIAGNOSIS — K219 Gastro-esophageal reflux disease without esophagitis: Secondary | ICD-10-CM | POA: Insufficient documentation

## 2012-01-28 DIAGNOSIS — E785 Hyperlipidemia, unspecified: Secondary | ICD-10-CM | POA: Insufficient documentation

## 2012-01-28 DIAGNOSIS — I519 Heart disease, unspecified: Secondary | ICD-10-CM | POA: Insufficient documentation

## 2012-01-28 SURGERY — JV LEFT HEART CATHETERIZATION WITH CORONARY ANGIOGRAM
Anesthesia: Moderate Sedation

## 2012-01-28 MED ORDER — SODIUM CHLORIDE 0.9 % IJ SOLN: 3.0000 mL | Freq: Two times a day (BID) | INTRAMUSCULAR | Status: DC

## 2012-01-28 MED ORDER — SODIUM CHLORIDE 0.9 % IV SOLN: 250.0000 mL | INTRAVENOUS | Status: DC | PRN

## 2012-01-28 MED ORDER — ASPIRIN 81 MG PO CHEW
324.0000 mg | CHEWABLE_TABLET | ORAL | Status: AC
  Administered 2012-01-28: 324 mg via ORAL

## 2012-01-28 MED ORDER — SODIUM CHLORIDE 0.9 % IV SOLN
INTRAVENOUS | Status: DC
  Administered 2012-01-28: 09:00:00 via INTRAVENOUS

## 2012-01-28 MED ORDER — SODIUM CHLORIDE 0.9 % IJ SOLN: 3.0000 mL | INTRAMUSCULAR | Status: DC | PRN

## 2012-01-28 NOTE — H&P (View-Only) (Signed)
Patient ID: Laura Poole, female   DOB: 12/13/1950, 60 y.o.   MRN: [id number] PCP: Dr. Hopper  60 yo with history of breast cancer and atypical chest pain as well as exertional dyspnea returns for cardiology evaluation.  Patient has had a 3+ year history of nonexertional chest pain.  Last myoview was in 4/11 and showed no evidence for ischemia.  Echo in 8/12 showed EF 65-70% with moderate mitral regurgitation and mild diastolic dysfunction.  BNP was not significantly elevated.   Since I last saw her, she has been having episodes of tingling throughout her right arm that will last for a few minutes then resolve.  She saw the PA in our office and carotid bruit was heard.  She had carotid dopplers, showing 60-79% LICA stenosis and 40-59% RICA stenosis.  About 10 days ago, she woke up in the morning and was unable to move either leg for 10-15 minutes.  This has not happened before or since.  She has an appointment to see Dr. Wong with neurology next week.   She reported significant exertional dyspnea when I last saw her last, but recently this has actually improved.  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    Mrs Rantz tells me that she has been getting more frequent chest symptoms.  For the last 6 wks, she has been having multiple episodes of "indigestion" daily.  She will get burning/discomfort in her central chest that will last 5-10 minutes at a time.  There is no trigger.  It is not exertional.  It is not clearly related to meals.  She has been diagnosed with GERD in the past and has Nexium but is not taking it.  Sometimes the symptoms are relieved by belching.    Labs (1/12): creatinine 0.93 Labs (8/12): K 3.7, creatinine 0.7, BNP 65, LDL 63, HDL 63 Labs (1/13): K 4, creatinine 0.9  PMH: 1. Breast cancer (2006): Treated with chemo and radiation. 2. GERD 3. Colon polyps 4. HTN 5. Hyperlipidemia 6. Atypical chest pain: Myoview  (4/11) with EF 70%, breast attenuation, no ischemia.  7. Exertional dyspnea: Echo (2008) with EF 60%, moderate LV hypertrophy, MVP, mild MR.  Echo (8/12): EF 60-65%, mild LV hypertrophy, grade I diastolic dysfunction, moderate MR.  8. Carotid stenosis: 2/13 carotids with 40-59% RICA, 60-79% LICA.   9. ABIs 2/13 were normal.   SH: Takes care of mother and 3 grandchildren.  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   FH: Father with MI at 65, Brother with MI at 55.  ROS: All sysems reviewed and negative except as per HPI.   Current Outpatient Prescriptions  Medication Sig Dispense Refill  . amLODipine (NORVASC) 10 MG tablet Take 1 tablet (10 mg total) by mouth daily.  90 tablet  3  . aspirin 81 MG tablet Take 1 tablet (81 mg total) by mouth daily.  30 tablet  6  . Cholecalciferol (VITAMIN D3) 2000 UNITS capsule Take 2,000 Units by mouth daily.        . CRESTOR 10 MG tablet TAKE 1 TABLET BY MOUTH AT BEDTIME  90 tablet  1  . esomeprazole (NEXIUM) 40 MG capsule Take 40 mg by mouth 2 (two) times daily.        . labetalol (NORMODYNE) 300 MG tablet TAKE 1 TABLET BY MOUTH TWICE DAILY  180 tablet  2  . Omega-3 Fatty Acids (FISH OIL) 1000 MG CAPS Take by mouth   daily.        . potassium chloride (K-DUR,KLOR-CON) 10 MEQ tablet Take 1 tablet (10 mEq total) by mouth daily.  30 tablet  5  . tolterodine (DETROL LA) 2 MG 24 hr capsule Take 2 mg by mouth as needed.       . triamterene-hydrochlorothiazide (DYAZIDE) 37.5-25 MG per capsule Take 1 capsule by mouth daily.  90 capsule  2  . zolpidem (AMBIEN) 10 MG tablet Take 10 mg by mouth daily as needed.         BP 138/88  Pulse 82  Ht 5' 7.5" (1.715 m)  Wt 177 lb 12.8 oz (80.65 kg)  BMI 27.44 kg/m2 General: NAD Neck: No JVD, no thyromegaly or thyroid nodule.  Lungs: Clear to auscultation bilaterally with normal respiratory effort. CV: Nondisplaced PMI.  Heart regular S1/S2, no S3/S4, 1/6 HSM at apex.  No peripheral edema.  Soft bilateral carotid bruits.   Difficult to palpate pedal pulses.  Abdomen: Soft, nontender, no hepatosplenomegaly, no distention.  Neurologic: Alert and oriented x 3.  Psych: Normal affect. Extremities: No clubbing or cyanosis.   

## 2012-01-28 NOTE — Procedures (Signed)
   Cardiac Catheterization Procedure Note  Name: Laura Poole MRN: [id number] DOB: 08-04-1951  Procedure: Left Heart Cath, Selective Coronary Angiography, LV angiography  Indication: Chest pain, equivocal coronary CTA.    Procedural Details: The right wrist was prepped, draped, and anesthetized with 1% lidocaine. Using the modified Seldinger technique, a 5 French sheath was introduced into the right radial artery. 3 mg of verapamil was administered through the sheath, weight-based unfractionated heparin was administered intravenously. Standard Judkins catheters were used for selective coronary angiography and left ventriculography. Catheter exchanges were performed over an exchange length guidewire. There were no immediate procedural complications. A TR band was used for radial hemostasis at the completion of the procedure.  The patient was transferred to the post catheterization recovery area for further monitoring.  Procedural Findings: Hemodynamics: AO 101/61 LV 100/16  Coronary angiography: Coronary dominance: right  Left mainstem: No angiographic CAD.   Left anterior descending (LAD): No angiographic CAD.   Left circumflex (LCx): There was a moderate ramus.  No angiographic CAD.   Right coronary artery (RCA): No angiographic CAD.   Left ventriculography: Left ventricular systolic function is normal, LVEF is estimated at 60%.  Final Conclusions:  No angiographic CAD, normal LV systolic function.   Recommendations: Suspect noncardiac chest pain.  Would continue workup of GERD.   Marca Ancona 01/28/2012, 10:56 AM

## 2012-01-28 NOTE — Progress Notes (Signed)
Normal Allen's test to right hand. 

## 2012-01-28 NOTE — Interval H&P Note (Signed)
History and Physical Interval Note:  01/28/2012 10:19 AM  Laura Poole  has presented today for surgery, with the diagnosis of chest pain  The various methods of treatment have been discussed with the patient and family. After consideration of risks, benefits and other options for treatment, the patient has consented to  Procedure(s) (LRB): JV LEFT HEART CATHETERIZATION WITH CORONARY ANGIOGRAM (N/A) as a surgical intervention .  The patients' history has been reviewed, patient examined, no change in status, stable for surgery.  I have reviewed the patients' chart and labs.  Questions were answered to the patient's satisfaction.     Azaria Bartell [employer]

## 2012-02-04 ENCOUNTER — Telehealth: Payer: Self-pay | Admitting: Cardiology

## 2012-02-04 NOTE — Telephone Encounter (Signed)
Normal to have some bruising/pain.  Will need followup to check site.

## 2012-02-04 NOTE — Telephone Encounter (Signed)
D[place]--ms boerema has appoint on Monday--is that soon enough?--nt

## 2012-02-04 NOTE — Telephone Encounter (Signed)
Please return call to patient [phone]-  Cath on 01/28/12   Patient had cath through right arm last Friday.  Patient is experiencing bruising, blood under the skin, slight pain at the sight.  Patient can be reached at wk # [phone].

## 2012-02-04 NOTE — Telephone Encounter (Signed)
Called pt and advised to use warm, wet, compresses x3 adys for 15 min--if unable to feel radial pulse at any time --please call us back--pt has f/u appoint on Monday--nt

## 2012-02-07 ENCOUNTER — Encounter: Payer: Self-pay | Admitting: Cardiology

## 2012-02-07 ENCOUNTER — Ambulatory Visit (INDEPENDENT_AMBULATORY_CARE_PROVIDER_SITE_OTHER): Payer: 59 | Admitting: Cardiology

## 2012-02-07 DIAGNOSIS — E785 Hyperlipidemia, unspecified: Secondary | ICD-10-CM

## 2012-02-07 DIAGNOSIS — R0989 Other specified symptoms and signs involving the circulatory and respiratory systems: Secondary | ICD-10-CM

## 2012-02-07 DIAGNOSIS — R079 Chest pain, unspecified: Secondary | ICD-10-CM

## 2012-02-07 NOTE — Patient Instructions (Signed)
Your physician recommends that you return for a FASTING lipid profile --this is scheduled for Wednesday May 1,2013 after 8:30am. Do not eat or drink after midnight.  Your physician wants you to follow-up in: 6 months with Dr Shirlee Latch. (October 2013). You will receive a reminder letter in the mail two months in advance. If you don't receive a letter, please call our office to schedule the follow-up appointment.

## 2012-02-07 NOTE — Telephone Encounter (Signed)
Pt seen by Dr Shirlee Latch in office for scheduled appt 02/07/12.

## 2012-02-08 NOTE — Assessment & Plan Note (Signed)
Suspect noncardiac chest pain.  Some improvement with regular use of Nexium.  Cath with no angiographic CAD.  She has some bruising and tenderness in her right forearm.  Her radial pulse is normal at the wrist.  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  I asked her to limit lifting with the right arm to 10 lbs or less for the next week.  I think that this will heal fine if she limits activity with the arm.

## 2012-02-08 NOTE — Progress Notes (Signed)
Patient ID: Laura Poole, female   DOB: 1950/12/29, 61 y.o.   MRN: [id number] PCP: Dr. Alwyn Ren  61 yo with history of breast cancer and atypical chest pain as well as exertional dyspnea returns for cardiology evaluation.  Patient has had a 3+ year history of nonexertional chest pain.  Last myoview was in 4/11 and showed no evidence for ischemia.  Echo in 8/12 showed EF 65-70% with moderate mitral regurgitation and mild diastolic dysfunction.  BNP was not significantly elevated.   Over the last few months, she has been having episodes of tingling throughout her right arm that will last for a few minutes then resolve.  She saw the PA in our office and carotid bruit was heard.  She had carotid dopplers, showing 60-79% LICA stenosis and 40-59% RICA stenosis.  One day several weeks ago, she woke up in the morning and was unable to move either leg for 10-15 minutes.  This has not happened before or since.  She has an appointment to see Dr. Modesto Charon with neurology.   When I last saw her, she reported increasing episodes of chest pain both with exertion and at rest.  Given her carotid disease, I was concerned despite a negative myoview in 4/11.  I set her up for a coronary CTA.   However, I was unable to lower her heart rate appropriately for the study and there was considerable artifact on the scan (involving the RCA and the LAD).  Therefore, I did a left heart cath that showed no angiographic CAD and normal EF.   Since the cath, she reports ongoing pain and some bruising in her right forearm (radial cath).  She actually went back to work 2 days after the cath and has been moving patients, lifting, etc.    Labs (1/12): creatinine 0.93 Labs (8/12): K 3.7, creatinine 0.7, BNP 65, LDL 63, HDL 63 Labs (1/13): K 4, creatinine 0.9 Labs (4/13): K 3.9, creatinine 0.9  PMH: 1. Breast cancer (2006): Treated with chemo and radiation. 2. GERD 3. Colon polyps 4. HTN 5. Hyperlipidemia 6. Atypical chest pain: Myoview  (4/11) with EF 70%, breast attenuation, no ischemia.  Coronary CTA (4/13): Difficult study with attenuation obscuring portions of the RCA and LAD.  LHC (4/13): No angiographic CAD, EF 60%.   7. Exertional dyspnea: Echo (2008) with EF 60%, moderate LV hypertrophy, MVP, mild MR.  Echo (8/12): EF 60-65%, mild LV hypertrophy, grade I diastolic dysfunction, moderate MR.  8. Carotid stenosis: 2/13 carotids with 40-59% RICA, 60-79% LICA.   9. ABIs 2/13 were normal.   SH: Takes care of mother and 3 grandchildren.  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  Nonsmoker.   FH: Father with MI at 4, Brother with MI at 42.   Current Outpatient Prescriptions  Medication Sig Dispense Refill  . amLODipine (NORVASC) 10 MG tablet Take 1 tablet (10 mg total) by mouth daily.  90 tablet  3  . aspirin 81 MG tablet Take 1 tablet (81 mg total) by mouth daily.  30 tablet  6  . Cholecalciferol (VITAMIN D3) 2000 UNITS capsule Take 2,000 Units by mouth daily.        . CRESTOR 10 MG tablet TAKE 1 TABLET BY MOUTH AT BEDTIME  90 tablet  1  . esomeprazole (NEXIUM) 40 MG capsule Take 40 mg by mouth 2 (two) times daily.        [name] labetalol (NORMODYNE) 300 MG tablet TAKE 1 TABLET BY MOUTH TWICE DAILY  180 tablet  2  .  Omega-3 Fatty Acids (FISH OIL) 1000 MG CAPS Take by mouth daily.        . potassium chloride (K-DUR,KLOR-CON) 10 MEQ tablet Take 1 tablet (10 mEq total) by mouth 2 (two) times daily.  180 tablet  3  . tolterodine (DETROL LA) 2 MG 24 hr capsule Take 2 mg by mouth as needed.       . triamterene-hydrochlorothiazide (DYAZIDE) 37.5-25 MG per capsule Take 1 capsule by mouth daily.  90 capsule  2  . zolpidem (AMBIEN) 10 MG tablet Take 10 mg by mouth daily as needed.         BP 124/88  Pulse 80  Ht 5' 7.5" (1.715 m)  Wt 175 lb 1.9 oz (79.434 kg)  BMI 27.02 kg/m2 General: NAD Neck: No JVD, no thyromegaly or thyroid nodule.  Lungs: Clear to auscultation bilaterally with normal respiratory effort. CV: Nondisplaced PMI.  Heart  regular S1/S2, no S3/S4, 1/6 HSM at apex.  No peripheral edema.  Soft bilateral carotid bruits.  Difficult to palpate pedal pulses.  Abdomen: Soft, nontender, no hepatosplenomegaly, no distention.  Neurologic: Alert and oriented x 3.  Psych: Normal affect. Extremities: No clubbing or cyanosis. Mild bruising/tenderness right forearm.  2+ radial pulses bilaterally.   [name]

## 2012-02-08 NOTE — Assessment & Plan Note (Signed)
Bilateral moderate carotid stenosis, L > R.  She has been having periodic paresthesias in her right arm and had an episode where she could not move either leg for 10-15 minutes.  I am not sure that either of these symptoms are referrable to her carotid disease.  She is going to see neurology.  If symptoms are not thought to be related to carotid disease, will repeat dopplers in 8/13.  

## 2012-02-08 NOTE — Assessment & Plan Note (Signed)
Check lipids/LFTs.  Goal LDL < 70 with known carotid stenosis.

## 2012-02-09 ENCOUNTER — Other Ambulatory Visit: Payer: 59

## 2012-03-09 ENCOUNTER — Telehealth: Payer: Self-pay

## 2012-03-09 NOTE — Telephone Encounter (Signed)
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 It sounds like she is probably due for screening/surveillance colonoscopy this Fall. Can you track down her records from [place] (most recent colonoscopy and pathology) and then I'll review and decide on appropriate timing. Her DOB is 1951-07-16 and her cell is [phone]  Pt needs to sign a release of records for review Left message on machine to call back

## 2012-03-09 NOTE — Telephone Encounter (Signed)
The pt is going to have her GI records sent to Dr Christella Hartigan for review

## 2012-03-28 ENCOUNTER — Telehealth: Payer: Self-pay | Admitting: Gastroenterology

## 2012-03-28 NOTE — Telephone Encounter (Signed)
Received 13 pages from [facility], sent to Dr. Christella Hartigan. SD 03/28/12

## 2012-04-03 ENCOUNTER — Ambulatory Visit: Payer: 59 | Admitting: Neurology

## 2012-04-03 ENCOUNTER — Telehealth: Payer: Self-pay | Admitting: Cardiology

## 2012-04-03 NOTE — Telephone Encounter (Signed)
busy

## 2012-04-03 NOTE — Telephone Encounter (Signed)
Spoke with pt. Pt will ask Dr Alwyn Ren, her PCP to prescribe Ambien for her.

## 2012-04-03 NOTE — Telephone Encounter (Addendum)
Pt usually takes [place] and since she is no longer going to Dr. Doneta Public he will not refill it and she needs a refill until she can get established with Dr. Christella Hartigan at [facility] GI pt will be at work number until 3pm then call her home number please

## 2012-05-01 ENCOUNTER — Other Ambulatory Visit: Payer: Self-pay

## 2012-05-01 ENCOUNTER — Encounter: Payer: Self-pay | Admitting: Gastroenterology

## 2012-05-01 ENCOUNTER — Ambulatory Visit (INDEPENDENT_AMBULATORY_CARE_PROVIDER_SITE_OTHER): Payer: 59 | Admitting: Gastroenterology

## 2012-05-01 VITALS — BP 144/94 | HR 104 | Ht 67.5 in | Wt 182.2 lb

## 2012-05-01 DIAGNOSIS — K219 Gastro-esophageal reflux disease without esophagitis: Secondary | ICD-10-CM

## 2012-05-01 DIAGNOSIS — Z86010 Personal history of colonic polyps: Secondary | ICD-10-CM

## 2012-05-01 MED ORDER — ESOMEPRAZOLE MAGNESIUM 40 MG PO CPDR: 40.0000 mg | DELAYED_RELEASE_CAPSULE | Freq: Two times a day (BID) | ORAL | Status: DC

## 2012-05-01 NOTE — Progress Notes (Signed)
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 She is 61 years old. Laura is the first time I am meeting her.  Was told she needed a repeat colonoscopy in 5 years.  Last colonoscopy was 12/2008.  A single small polyp was removed it was Laura she had some internal and external hemorrhoids. The exam was otherwise normal.  No changed in bowels, no overt bleeding.    Has periodic indigestion.  Takes nexium shortly after breakfast and one at bedtime.    No signficant abd pains.  Had tiny non-shadowing gallstone vs gb polyp on 2009 [place].  Used to drink 4-5 sodas a day, 1-2 cups coffee.   Much less soda now, drinks a 20 ounce coffee in AM only now.      Review of systems: Pertinent positive and negative review of systems were noted in the above HPI section. Complete review of systems was performed and was otherwise normal.    Past Medical History  Diagnosis Date  . History of breast cancer 2006  . GERD (gastroesophageal reflux disease)   . History of colonic polyps   . HTN (hypertension)   . HLD (hyperlipidemia)   . H/O: hysterectomy   . Chest pain     Past Surgical History  Procedure Date  . Breast lumpectomy 2006  . Colonoscopy w/ polypectomy 2004, 2009  . Tubal ligation   . Abdominal hysterectomy     Current Outpatient Prescriptions  Medication Sig Dispense Refill  . amLODipine (NORVASC) 10 MG tablet Take 1 tablet (10 mg total) by mouth daily.  90 tablet  3  . aspirin 81 MG tablet Take 1 tablet (81 mg total) by mouth daily.  30 tablet  6  . Cholecalciferol (VITAMIN D3) 2000 UNITS capsule Take 2,000 Units by mouth daily.        . CRESTOR 10 MG tablet TAKE 1 TABLET BY MOUTH AT BEDTIME  90 tablet  1  . esomeprazole (NEXIUM) 40 MG capsule Take 40 mg by mouth 2 (two) times daily.        [name] labetalol (NORMODYNE) 300 MG tablet TAKE 1 TABLET BY MOUTH TWICE DAILY  180 tablet  2  . Omega-3 Fatty Acids (FISH OIL) 1000 MG CAPS Take by mouth daily.         . potassium chloride (K-DUR,KLOR-CON) 10 MEQ tablet Take 1 tablet (10 mEq total) by mouth 2 (two) times daily.  180 tablet  3  . triamterene-hydrochlorothiazide (DYAZIDE) 37.5-25 MG per capsule Take 1 capsule by mouth daily.  90 capsule  2  . zolpidem (AMBIEN) 10 MG tablet Take 10 mg by mouth daily as needed.         Allergies as of 05/01/2012  . (No Known Allergies)    Family History  Problem Relation Age of Onset  . Heart attack Father 53  . Hypertension Mother   . Coronary artery disease Mother   . Aneurysm Brother   . Asthma Other     nephew  . Hyperlipidemia Other     nephew  . Sudden death Other     nephew  . Colon cancer Neg Hx     History   Social History  . Marital Status: Single    Spouse Name: N/A    Number of Children: N/A  . Years of Education: N/A   Occupational History  . Not on file.   Social History Main Topics  . Smoking status: Never Smoker   .  Smokeless tobacco: Never Used  . Alcohol Use: 0.0 oz/week     rarely  . Drug Use: No  . Sexually Active: Not on file   Other Topics Concern  . Not on file   Social History Narrative  . No narrative on file       Physical Exam: BP 144/94  Pulse 104  Ht 5' 7.5" (1.715 m)  Wt 182 lb 3.2 oz (82.645 kg)  BMI 28.12 kg/m2 Constitutional: generally well-appearing Psychiatric: alert and oriented x3 Eyes: extraocular movements intact Mouth: oral pharynx moist, no lesions Neck: supple no lymphadenopathy Cardiovascular: heart regular rate and rhythm Lungs: clear to auscultation bilaterally Abdomen: soft, nontender, nondistended, no obvious ascites, no peritoneal signs, normal bowel sounds Extremities: no lower extremity edema bilaterally Skin: no lesions on visible extremities    Assessment and plan: 61 y.o. female with  possible personal history of adenomatous colon polyps  She tells me she had adenomatous polyps on 2004 colonoscopy however I do not have that report here to verify. We will  get sent over from her previous gastroenterologist and we'll put her in the correct recall based on that. For now her recall will be March 2015 which would be 5 years from the date of her last one. I also recommended she change the way she is taking her proton pump inhibitor.

## 2012-05-01 NOTE — Patient Instructions (Addendum)
We will get copy of 2004 colonoscopy with Dr. Ewing Schlein and any associated pathology. For now, recall colonoscopy in March 2015 for history of polyps. You should change the way you are taking your antiacid medicine (nexium) so that you are taking it 20-30 minutes prior to a decent meal as that is the way the pill is designed to work most effectively. Call if any new problems arise.

## 2012-05-30 ENCOUNTER — Other Ambulatory Visit: Payer: Self-pay | Admitting: Internal Medicine

## 2012-05-30 MED ORDER — ROSUVASTATIN CALCIUM 10 MG PO TABS: ORAL_TABLET | ORAL | Status: DC

## 2012-05-30 MED ORDER — TRIAMTERENE-HCTZ 37.5-25 MG PO CAPS: 1.0000 | ORAL_CAPSULE | Freq: Every day | ORAL | Status: DC

## 2012-05-30 NOTE — Telephone Encounter (Signed)
Refills x 2 last ov 8.1.12 follow up-wt acute issue  1-Triamterene-HCTZ (Cap) 37.5-25 MG last wrt 8.2.12 #90 wt/2-refills directions: Take 1 capsule by mouth daily.  2-CRESTOR 10 MG last wrt 3.1113 90wt/1-refill directions TAKE 1 TABLET BY MOUTH AT BEDTIME   BOTH To go to [place] [facility]

## 2012-06-05 ENCOUNTER — Telehealth: Payer: Self-pay | Admitting: Internal Medicine

## 2012-06-05 DIAGNOSIS — T887XXA Unspecified adverse effect of drug or medicament, initial encounter: Secondary | ICD-10-CM

## 2012-06-05 DIAGNOSIS — E785 Hyperlipidemia, unspecified: Secondary | ICD-10-CM

## 2012-06-05 NOTE — Telephone Encounter (Signed)
labs lipid/hep going to [facility], can you put orders in to be pulled at [facility]

## 2012-06-26 ENCOUNTER — Other Ambulatory Visit (INDEPENDENT_AMBULATORY_CARE_PROVIDER_SITE_OTHER): Payer: 59

## 2012-06-26 ENCOUNTER — Other Ambulatory Visit: Payer: Self-pay | Admitting: Cardiology

## 2012-06-26 DIAGNOSIS — T887XXA Unspecified adverse effect of drug or medicament, initial encounter: Secondary | ICD-10-CM

## 2012-06-26 DIAGNOSIS — E785 Hyperlipidemia, unspecified: Secondary | ICD-10-CM

## 2012-06-26 LAB — LIPID PANEL
Cholesterol: 174 mg/dL (ref 0–200)
HDL: 65.4 mg/dL (ref >39.00)
Total CHOL/HDL Ratio: 3
Triglycerides: 70 mg/dL (ref 0.0–149.0)

## 2012-06-26 LAB — HEPATIC FUNCTION PANEL
ALT: 22 U/L (ref 0–35)
AST: 21 U/L (ref 0–37)
Albumin: 4.1 g/dL (ref 3.5–5.2)
Alkaline Phosphatase: 65 U/L (ref 39–117)
Total Protein: 7.6 g/dL (ref 6.0–8.3)

## 2012-06-28 ENCOUNTER — Telehealth: Payer: Self-pay | Admitting: Gastroenterology

## 2012-06-28 NOTE — Telephone Encounter (Signed)
I spoke with Dr Marlane Hatcher office and the records are suppose to be faxed today.  I have been requesting these for several months.

## 2012-06-28 NOTE — Telephone Encounter (Signed)
Laura Poole told me today at [facility] endo that she's been having vomiting, dysphagia like symptoms;  Have occurred previously from what I can tell.  SHe has had EGD with Dr. Ewing Schlein in past but I cannot find those reports.  Can you call to have all [facility] EGD reports sent here for review.  Pending review of those results, I will consider EGD vs. Barium UGI testing.

## 2012-06-28 NOTE — Telephone Encounter (Signed)
Records are on your desk

## 2012-06-29 NOTE — Progress Notes (Signed)
Left message on answering machine for patient to call back.  

## 2012-06-30 ENCOUNTER — Telehealth: Payer: Self-pay | Admitting: Gastroenterology

## 2012-06-30 DIAGNOSIS — R112 Nausea with vomiting, unspecified: Secondary | ICD-10-CM

## 2012-06-30 NOTE — Telephone Encounter (Signed)
07/12/12 930am  915 am arrival NPO midnight [facility]  Pt has been notified and pt instructed

## 2012-06-30 NOTE — Telephone Encounter (Signed)
Colonoscopy by Dr. Ewing Schlein November 2004 done for guaiac positive stool, "due for colon cancer screening" described internal and external hemorrhoids, a few hyperplastic-appearing distal sigmoid and rectal polyps, otherwise normal examination to the terminal ileum. Past cytology showed hyperplastic polyps.  He recommended repeat examination in 5 years colonoscopy March 2010 Dr. Ewing Schlein again showed internal and external hemorrhoids, tiny descending polyp that was biopsied. This actually suggested lipoma.  I see no evidence that she has ever had adenomatous colon polyps based on review of the above tests.  No FH of colon cancer.  EGD March 2010 by Dr. Ewing Schlein done for "upper tract symptoms" described small hiatal hernia otherwise normal EGD.    Patty, Please call her. First I reviewed her previous 2 colonoscopies and she has not had precancerous colon polyps and so her next colonoscopy for routine cancer screening should be March 2020. Please adjust her recall in our system. EGD 3 years ago was normal except for small hiatal hernia. Given her recent upper GI symptoms including nausea, dysphasia I would like her to have a barium esophagram performed.

## 2012-07-12 ENCOUNTER — Ambulatory Visit (HOSPITAL_COMMUNITY)
Admission: RE | Admit: 2012-07-12 | Discharge: 2012-07-12 | Disposition: A | Discharge status: Home / Self Care | Payer: 59 | Source: Ambulatory Visit | Attending: Gastroenterology | Admitting: Gastroenterology

## 2012-07-12 DIAGNOSIS — R112 Nausea with vomiting, unspecified: Secondary | ICD-10-CM | POA: Insufficient documentation

## 2012-07-12 DIAGNOSIS — R6889 Other general symptoms and signs: Secondary | ICD-10-CM | POA: Insufficient documentation

## 2012-07-12 DIAGNOSIS — K219 Gastro-esophageal reflux disease without esophagitis: Secondary | ICD-10-CM | POA: Insufficient documentation

## 2012-07-12 DIAGNOSIS — K449 Diaphragmatic hernia without obstruction or gangrene: Secondary | ICD-10-CM | POA: Insufficient documentation

## 2012-08-09 ENCOUNTER — Ambulatory Visit (INDEPENDENT_AMBULATORY_CARE_PROVIDER_SITE_OTHER): Payer: 59 | Admitting: Internal Medicine

## 2012-08-09 ENCOUNTER — Encounter: Payer: Self-pay | Admitting: Internal Medicine

## 2012-08-09 VITALS — BP 128/76 | HR 81 | Temp 98.3°F | Wt 178.0 lb

## 2012-08-09 DIAGNOSIS — M4050 Lordosis, unspecified, site unspecified: Secondary | ICD-10-CM

## 2012-08-09 DIAGNOSIS — M1990 Unspecified osteoarthritis, unspecified site: Secondary | ICD-10-CM

## 2012-08-09 DIAGNOSIS — R202 Paresthesia of skin: Secondary | ICD-10-CM

## 2012-08-09 DIAGNOSIS — R209 Unspecified disturbances of skin sensation: Secondary | ICD-10-CM

## 2012-08-09 DIAGNOSIS — Z23 Encounter for immunization: Secondary | ICD-10-CM

## 2012-08-09 DIAGNOSIS — I10 Essential (primary) hypertension: Secondary | ICD-10-CM

## 2012-08-09 DIAGNOSIS — M4040 Postural lordosis, site unspecified: Secondary | ICD-10-CM

## 2012-08-09 DIAGNOSIS — E785 Hyperlipidemia, unspecified: Secondary | ICD-10-CM

## 2012-08-09 DIAGNOSIS — M62838 Other muscle spasm: Secondary | ICD-10-CM

## 2012-08-09 DIAGNOSIS — Z780 Asymptomatic menopausal state: Secondary | ICD-10-CM

## 2012-08-09 LAB — BASIC METABOLIC PANEL
CO2: 30 mEq/L (ref 19–32)
Calcium: 9.3 mg/dL (ref 8.4–10.5)
Creatinine, Ser: 0.8 mg/dL (ref 0.4–1.2)
GFR: 89.15 mL/min (ref >60.00)
Sodium: 141 mEq/L (ref 135–145)

## 2012-08-09 MED ORDER — ROSUVASTATIN CALCIUM 10 MG PO TABS: ORAL_TABLET | ORAL | Status: DC

## 2012-08-09 MED ORDER — LABETALOL HCL 300 MG PO TABS: ORAL_TABLET | ORAL | Status: DC

## 2012-08-09 MED ORDER — SPIRONOLACTONE 25 MG PO TABS: ORAL_TABLET | ORAL | Status: DC

## 2012-08-09 NOTE — Patient Instructions (Addendum)
Blood Pressure Goal  Ideally is an AVERAGE < 135/85. This AVERAGE should be calculated from @ least 5-7 BP readings taken @ different times of day on different days of week. You should not respond to isolated BP readings , but rather the AVERAGE for that week.  If you activate My Chart; the results can be released to you as soon as they populate from the lab. If you choose not to use this program; the labs have to be reviewed, copied & mailed   causing a delay in getting the results to you.  

## 2012-08-09 NOTE — Progress Notes (Signed)
Subjective:    Patient ID: Laura Poole, female    DOB: [birth year], 61 y.o.   MRN: [id number]  HPI  She describes "spasms" particularly in her legs from hips down for 3 months. It did improve without specific treatment; but recurred late last week. It involves the entire legs rather than localized to the joints. There was no specific trigger for this such as nausea vomiting or diarrhea.   She also describes some stiffness in in her RUE. For 2--3 months she's also had some bladder incontinence. She does describe some numbness in her right upper extremity. She does have some pain at the PIP joint of the right thumb. She does bruise with minor trauma but denies any nontraumatic bruising or bleeding.  She started on magnesium supplement last week with resolution of the spasms. She takes 1000 international units of vitamin D daily; she has not had her vitamin D level checked.  She is on generic triamterene/thiazide 37.5/25. She been taking a potassium supplement 10 mEq twice a day because of hypokalemia associated with the diuretic. Her potassium was 3.2 in January of this year; in April was 3.9. Renal function has been excellent. She's also exhibited a mild, essentially stable anemia with hematocrits as low as 35.2.   Review of Systems Constitutional: no fever, chills, sweats, change in weight  Musculoskeletal: no  joint stiffness, redness, or swelling Skin:no rash, color/temperature change Neuro: no weakness;  numbness and tingling Heme:no lymphadenopathy; abnormal bruising or bleeding        Objective:   Physical Exam Gen.: Healthy and well-nourished in appearance. Alert, appropriate and cooperative throughout exam. Head: Normocephalic without obvious abnormalities  Eyes: No corneal or conjunctival inflammation noted. No lid lag or proptosis. Extraocular motion intact.  Neck: No deformities, masses, or tenderness noted. Range of motion & Thyroid normal Lungs: Normal respiratory effort;  chest expands symmetrically. Lungs are clear to auscultation without rales, wheezes, or increased work of breathing. Heart: Normal rate and rhythm. Normal S1 and S2. No gallop, click, or rub. Grade 1/6 systolic murmur  Abdomen: Bowel sounds normal; abdomen soft and nontender. No masses, organomegaly or hernias noted.  Musculoskeletal/extremities: Lordosis  noted of  the thoracic spine. No clubbing, cyanosis, edema, or deformity noted. Range of motion  normal .Tone & strength  normal.Joints normal. Nail health  good. Vascular: Carotid, radial artery, dorsalis pedis and  posterior tibial pulses are full and equal. No bruits present. Neurologic: Alert and oriented x3. Deep tendon reflexes symmetrical and normal. Sensation to light touch equal and normal over the upper extremities         Skin: Intact without suspicious lesions or rashes. Lymph: No cervical, axillary lymphadenopathy present. Psych: Mood and affect are normal. Normally interactive                                                                                         Assessment & Plan:  #1 leg spasms in the context of prior hypokalemia. Improvement with magnesium supplementation. Unknown vitamin D level status  #2 numbness and stiffness right upper extremity with PIP thumb pain. Loss of sensation not documented on exam. The latter is  most likely related to degenerative joint disease.  #3 urinary incontinence; urologic evaluation indicated  #4 hypertension, well-controlled; but it may be appropriate to change the diuretic and potassium supplement to spironolactone.  Plan: See orders & recommendations

## 2012-08-15 LAB — VITAMIN D 1,25 DIHYDROXY
Vitamin D 1, 25 (OH)2 Total: 47 pg/mL (ref 18–72)
Vitamin D3 1, 25 (OH)2: 47 pg/mL

## 2012-09-11 ENCOUNTER — Inpatient Hospital Stay: Admission: RE | Admit: 2012-09-11 | Payer: 59 | Source: Ambulatory Visit

## 2012-09-12 ENCOUNTER — Ambulatory Visit (INDEPENDENT_AMBULATORY_CARE_PROVIDER_SITE_OTHER)
Admission: RE | Admit: 2012-09-12 | Discharge: 2012-09-12 | Disposition: A | Discharge status: Home / Self Care | Payer: 59 | Source: Ambulatory Visit | Attending: Internal Medicine | Admitting: Internal Medicine

## 2012-09-12 DIAGNOSIS — M62838 Other muscle spasm: Secondary | ICD-10-CM

## 2012-09-12 DIAGNOSIS — M4040 Postural lordosis, site unspecified: Secondary | ICD-10-CM

## 2012-09-12 DIAGNOSIS — R202 Paresthesia of skin: Secondary | ICD-10-CM

## 2012-09-12 DIAGNOSIS — M4050 Lordosis, unspecified, site unspecified: Secondary | ICD-10-CM

## 2012-09-12 DIAGNOSIS — M1990 Unspecified osteoarthritis, unspecified site: Secondary | ICD-10-CM

## 2012-09-12 DIAGNOSIS — R209 Unspecified disturbances of skin sensation: Secondary | ICD-10-CM

## 2012-09-12 DIAGNOSIS — I10 Essential (primary) hypertension: Secondary | ICD-10-CM

## 2012-09-12 DIAGNOSIS — Z780 Asymptomatic menopausal state: Secondary | ICD-10-CM

## 2012-09-28 ENCOUNTER — Telehealth: Payer: Self-pay | Admitting: *Deleted

## 2012-09-28 NOTE — Telephone Encounter (Signed)
Pt left VM that she had BMD done a few weeks ago and would like to get results. Discuss with patient results per Dr hopper comments.  Entered by Pecola Lawless, MD at 09/19/2012 12:51 PM All T scores are excellent. Repeat BMD in 3-5 years.[employer]

## 2012-10-29 ENCOUNTER — Emergency Department (INDEPENDENT_AMBULATORY_CARE_PROVIDER_SITE_OTHER): Payer: 59

## 2012-10-29 ENCOUNTER — Emergency Department (HOSPITAL_COMMUNITY)
Admission: EM | Admit: 2012-10-29 | Discharge: 2012-10-29 | Disposition: A | Discharge status: Home / Self Care | Payer: 59 | Source: Home / Self Care

## 2012-10-29 ENCOUNTER — Encounter (HOSPITAL_COMMUNITY): Payer: Self-pay | Admitting: Emergency Medicine

## 2012-10-29 DIAGNOSIS — J111 Influenza due to unidentified influenza virus with other respiratory manifestations: Secondary | ICD-10-CM

## 2012-10-29 LAB — CBC
MCH: 29.5 pg (ref 26.0–34.0)
Platelets: 252 10*3/uL (ref 150–400)
RBC: 4.41 MIL/uL (ref 3.87–5.11)

## 2012-10-29 LAB — INFLUENZA PANEL BY PCR (TYPE A & B)
Influenza A By PCR: POSITIVE — AB
Influenza B By PCR: NEGATIVE

## 2012-10-29 LAB — POCT I-STAT, CHEM 8
Creatinine, Ser: 1.1 mg/dL (ref 0.50–1.10)
Glucose, Bld: 95 mg/dL (ref 70–99)
HCT: 42 % (ref 36.0–46.0)
Hemoglobin: 14.3 g/dL (ref 12.0–15.0)
Sodium: 139 mEq/L (ref 135–145)
TCO2: 29 mmol/L (ref 0–100)

## 2012-10-29 MED ORDER — ACETAMINOPHEN 325 MG PO TABS
ORAL_TABLET | ORAL | Status: AC
  Filled 2012-10-29: qty 2

## 2012-10-29 MED ORDER — ACETAMINOPHEN 325 MG PO TABS
650.0000 mg | ORAL_TABLET | Freq: Four times a day (QID) | ORAL | Status: AC | PRN
  Administered 2012-10-29: 650 mg via ORAL

## 2012-10-29 MED ORDER — OSELTAMIVIR PHOSPHATE 75 MG PO CAPS: 75.0000 mg | ORAL_CAPSULE | Freq: Two times a day (BID) | ORAL | Status: DC

## 2012-10-29 NOTE — ED Notes (Signed)
Waiting discharge papers 

## 2012-10-29 NOTE — ED Notes (Signed)
Pt c/o cough non productive. Fever. And vomiting x last night. Fatigue. Pt states she had virus a little over a week ago. Some body aches. Back hurts with breathing. Pt has tried otc meds with no relief.

## 2012-10-29 NOTE — ED Provider Notes (Signed)
Laura Poole is a 62 y.o. female who presents to Urgent Care today for cough, headache, body aches starting yesterday evening. She has positive sick contacts with the mother who was diagnosed with pneumonia this week. 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 She's tried over-the-counter cold medications for her symptoms which have helped only a bit. She denies any chest pains palpitation or trouble breathing. She does note fever and chills but denies any nausea vomiting or diarrhea. She did receive a flu shot this year.    PMH reviewed. Significant for hypertension, hyperlipidemia, GERD, and history of breast cancer in 2006 currently in remission/cure. History  Substance Use Topics  . Smoking status: Never Smoker   . Smokeless tobacco: Never Used  . Alcohol Use: 0.0 oz/week     Comment: rarely   ROS as above Medications reviewed. No current facility-administered medications for this encounter.   Current Outpatient Prescriptions  Medication Sig Dispense Refill  . amLODipine (NORVASC) 10 MG tablet TAKE 1 TABLET BY MOUTH DAILY  90 tablet  PRN  . aspirin 81 MG tablet Take 1 tablet (81 mg total) by mouth daily.  30 tablet  6  . cetirizine (ZYRTEC) 10 MG tablet Take 10 mg by mouth daily.      . Cholecalciferol (VITAMIN D3) 1000 UNITS CAPS Take by mouth daily.      [name] esomeprazole (NEXIUM) 40 MG capsule Take 1 capsule (40 mg total) by mouth 2 (two) times daily.  60 capsule  11  . magnesium oxide (MAG-OX) 400 MG tablet Take 400 mg by mouth. 2 by mouth once daily      . Omega-3 Fatty Acids (FISH OIL) 1000 MG CAPS Take by mouth daily.        . rosuvastatin (CRESTOR) 10 MG tablet TAKE 1 TABLET BY MOUTH AT BEDTIME  30 tablet  5  . spironolactone (ALDACTONE) 25 MG tablet In place of generic Dyazide and potassium supplement  90 tablet  0  . zolpidem (AMBIEN) 10 MG tablet Take 10 mg by mouth daily as needed.       . labetalol (NORMODYNE) 300 MG tablet TAKE 1 TABLET BY MOUTH  TWICE DAILY  180 tablet  1  . oseltamivir (TAMIFLU) 75 MG capsule Take 1 capsule (75 mg total) by mouth 2 (two) times daily.  10 capsule  0    Exam:  BP 175/107  Pulse 110  Temp 102 F (38.9 C) (Oral)  Resp 20  SpO2 95% Gen: Well NAD HEENT: EOMI,  MMM Lungs: CTABL Nl WOB Heart: RRR no MRG Abd: NABS, NT, ND Exts: Non edematous BL  LE, warm and well perfused.   Results for orders placed during the hospital encounter of 10/29/12 (from the past 24 hour(s))  CBC     Status: Normal   Collection Time   10/29/12  4:10 PM      Component Value Range   WBC 7.3  4.0 - 10.5 K/uL   RBC 4.41  3.87 - 5.11 MIL/uL   Hemoglobin 13.0  12.0 - 15.0 g/dL   HCT 16.1  09.6 - 04.5 %   MCV 87.3  78.0 - 100.0 fL   MCH 29.5  26.0 - 34.0 pg   MCHC 33.8  30.0 - 36.0 g/dL   RDW 40.9  81.1 - 91.4 %   Platelets 252  150 - 400 K/uL  POCT I-STAT, CHEM 8     Status: Abnormal   Collection Time   10/29/12  4:52  PM      Component Value Range   Sodium 139  135 - 145 mEq/L   Potassium 3.5  3.5 - 5.1 mEq/L   Chloride 104  96 - 112 mEq/L   BUN 7  6 - 23 mg/dL   Creatinine, Ser 1.61  0.50 - 1.10 mg/dL   Glucose, Bld 95  70 - 99 mg/dL   Calcium, Ion 0.96 (*) 1.12 - 1.23 mmol/L   TCO2 29  0 - 100 mmol/L   Hemoglobin 14.3  12.0 - 15.0 g/dL   HCT 04.5  40.9 - 81.1 %   Dg Chest 2 View  10/29/2012  *RADIOLOGY REPORT*  Clinical Data: Fever and cough.  CHEST - 2 VIEW  Comparison: 04/06/2006  Findings: Heart size is upper normal.  Negative for heart failure. Negative for pneumonia or effusion.  Lungs are clear.  Thoracic disc degeneration and spurring.  IMPRESSION: No acute abnormality.   Original Report Authenticated By: Janeece Riggers, M.D.     Assessment and Plan: 62 y.o. female with influenza-like illness.  Chest x-ray does not indicate pneumonia, and CBC did not indicate acute bacterial illness.  We have obtained a influenza swab however this will not be back for sometime.  Plan for empiric treatment with Tamiflu.    Additionally supportive care with Tylenol.  Discussed warning signs or symptoms. Please see discharge instructions. Patient expresses understanding. I will call the patient if her influenza swab is negative and tell her to not take Tamiflu.     Rodolph Bong, MD 10/29/12 [phone]

## 2012-10-30 NOTE — ED Provider Notes (Signed)
Medical screening examination/treatment/procedure(s) were performed by a resident physician and as supervising physician I was immediately available for consultation/collaboration.  Leslee Home, M.D.   Reuben Likes, MD 10/30/12 [phone]

## 2012-10-30 NOTE — ED Notes (Signed)
Influenza Panel pos. H1 N1 detected.  Pt. Adequately treated with Tamiflu. Vassie Moselle 10/30/2012

## 2012-11-01 NOTE — ED Notes (Signed)
Pt called to confirm test results - verified id with two identifiers - confirmed h1n1 dx

## 2012-11-06 NOTE — ED Notes (Signed)
Copy of flu screening faxed to pt, after using 2 identifiers ; returned call to verify had recieved

## 2012-12-07 ENCOUNTER — Encounter: Payer: Self-pay | Admitting: Internal Medicine

## 2012-12-07 ENCOUNTER — Telehealth: Payer: Self-pay | Admitting: Internal Medicine

## 2012-12-07 DIAGNOSIS — I10 Essential (primary) hypertension: Secondary | ICD-10-CM

## 2012-12-07 MED ORDER — SPIRONOLACTONE 25 MG PO TABS: ORAL_TABLET | ORAL | Status: DC

## 2012-12-07 NOTE — Telephone Encounter (Signed)
Refill: Spironolactone 25 mg tablet. Take 1 tablet by mouth daily. Qty 90. Last fill 08-10-12

## 2012-12-08 ENCOUNTER — Other Ambulatory Visit: Payer: Self-pay

## 2012-12-08 DIAGNOSIS — T887XXA Unspecified adverse effect of drug or medicament, initial encounter: Secondary | ICD-10-CM

## 2012-12-08 DIAGNOSIS — E785 Hyperlipidemia, unspecified: Secondary | ICD-10-CM

## 2012-12-08 MED ORDER — ATORVASTATIN CALCIUM 20 MG PO TABS: 20.0000 mg | ORAL_TABLET | Freq: Every day | ORAL | Status: DC

## 2013-01-04 ENCOUNTER — Telehealth: Payer: Self-pay | Admitting: Gastroenterology

## 2013-01-04 MED ORDER — PANTOPRAZOLE SODIUM 40 MG PO TBEC: 40.0000 mg | DELAYED_RELEASE_TABLET | Freq: Every day | ORAL | Status: DC

## 2013-01-04 NOTE — Telephone Encounter (Signed)
rx sent per pt request

## 2013-01-05 ENCOUNTER — Telehealth: Payer: Self-pay | Admitting: *Deleted

## 2013-01-05 NOTE — Telephone Encounter (Signed)
Patient called refill line yesterday of refill of Ambien. Notified patient that Dr Shirlee Latch has to physical sign approval for this medication before we can fax to [facility]. I let patient know the Dr Shirlee Latch is in and out between the hospital and office today. Patient verbally agreed.   Micki Riley, CMA

## 2013-01-16 ENCOUNTER — Other Ambulatory Visit: Payer: Self-pay | Admitting: *Deleted

## 2013-01-16 MED ORDER — ZOLPIDEM TARTRATE 10 MG PO TABS: 10.0000 mg | ORAL_TABLET | Freq: Every day | ORAL | Status: DC | PRN

## 2013-01-16 NOTE — Telephone Encounter (Signed)
Returning patient's call about Ambien. Dr Shirlee Latch said to ask her PCP for the refill of Ambien. She verbally agreed and said thank you for the follow up call/    Laura Poole, CMA

## 2013-01-16 NOTE — Telephone Encounter (Signed)
She should ask her PCP to fill for her

## 2013-01-16 NOTE — Telephone Encounter (Signed)
Returning patient's call about a refill for Ambien. I let her know that has to be approved by Dr Shirlee Latch and I will forward this message to both him and his nurse. She said thank you. I let her that someone will get back with her on whether this will be refilled or not either by another nurse or by me personally.     Micki Riley, CMA

## 2013-01-16 NOTE — Telephone Encounter (Signed)
Ambien 5 mg qhs every 3rd night prn only #10 If insomnia is a chronic problem; Sleep specialty evaluation is indicated. There are many different causes of disturbed sleep including restless leg syndrome , sleep apnea, et Karie Soda. Chronic use of sleeping pills has a number of adverse effects. These include tolerance or lack of effectiveness; automated  behavior such as driving or eating while actually asleep ; & also potential  for habituation.The exact etiology of insomnia  should be be diagnosed for optimal response and avoidance of these potential adverse effects.

## 2013-01-18 ENCOUNTER — Other Ambulatory Visit: Payer: Self-pay | Admitting: Obstetrics and Gynecology

## 2013-01-18 DIAGNOSIS — Z853 Personal history of malignant neoplasm of breast: Secondary | ICD-10-CM

## 2013-03-12 ENCOUNTER — Encounter: Payer: Self-pay | Admitting: Internal Medicine

## 2013-03-19 ENCOUNTER — Other Ambulatory Visit: Payer: Self-pay | Admitting: Oncology

## 2013-03-19 DIAGNOSIS — Z853 Personal history of malignant neoplasm of breast: Secondary | ICD-10-CM

## 2013-04-12 ENCOUNTER — Ambulatory Visit
Admission: RE | Admit: 2013-04-12 | Discharge: 2013-04-12 | Disposition: A | Discharge status: Home / Self Care | Payer: 59 | Source: Ambulatory Visit | Attending: Oncology | Admitting: Oncology

## 2013-04-12 DIAGNOSIS — Z853 Personal history of malignant neoplasm of breast: Secondary | ICD-10-CM

## 2013-04-30 ENCOUNTER — Encounter: Payer: Self-pay | Admitting: Internal Medicine

## 2013-04-30 ENCOUNTER — Ambulatory Visit (INDEPENDENT_AMBULATORY_CARE_PROVIDER_SITE_OTHER): Payer: 59 | Admitting: Internal Medicine

## 2013-04-30 VITALS — BP 128/76 | HR 85 | Temp 98.2°F | Resp 12 | Wt 172.0 lb

## 2013-04-30 DIAGNOSIS — I10 Essential (primary) hypertension: Secondary | ICD-10-CM

## 2013-04-30 DIAGNOSIS — E559 Vitamin D deficiency, unspecified: Secondary | ICD-10-CM

## 2013-04-30 DIAGNOSIS — R748 Abnormal levels of other serum enzymes: Secondary | ICD-10-CM

## 2013-04-30 DIAGNOSIS — E785 Hyperlipidemia, unspecified: Secondary | ICD-10-CM

## 2013-04-30 LAB — HEPATIC FUNCTION PANEL
Albumin: 3.9 g/dL (ref 3.5–5.2)
Bilirubin, Direct: 0 mg/dL (ref 0.0–0.3)
Total Protein: 7.4 g/dL (ref 6.0–8.3)

## 2013-04-30 LAB — LIPID PANEL
HDL: 64.5 mg/dL (ref >39.00)
Triglycerides: 70 mg/dL (ref 0.0–149.0)

## 2013-04-30 NOTE — Assessment & Plan Note (Signed)
Fasting lipids & hepatic panel 

## 2013-04-30 NOTE — Assessment & Plan Note (Signed)
BMET WNL in 01/14. BP goals discussed

## 2013-04-30 NOTE — Patient Instructions (Addendum)
Minimal Blood Pressure Goal= AVERAGE < 140/90;  Ideal is an AVERAGE < 135/85. This AVERAGE should be calculated from @ least 5-7 BP readings taken @ different times of day on different days of week. You should not respond to isolated BP readings , but rather the AVERAGE for that week .Please bring your  blood pressure cuff to office visits to verify that it is reliable.It  can also be checked against the blood pressure device at the pharmacy.  Share results with all non [facility] medical staff seen

## 2013-04-30 NOTE — Assessment & Plan Note (Signed)
Vit D level.

## 2013-04-30 NOTE — Assessment & Plan Note (Signed)
Recheck CK

## 2013-04-30 NOTE — Progress Notes (Signed)
  Subjective:    Patient ID: Laura Poole, female    DOB: 1951/09/16, 62 y.o.   MRN: [id number]  HPI  She is here for medication management and refills. She is essentially asymptomatic except for some frequency and urgency.    Review of Systems She is on a heart healthy diet; she is not exercising . She denies chest pain, palpitations, dyspnea, or claudication.  Family history is negative for premature coronary disease . Advanced cholesterol testing reveals her LDL goal is less than 115. There is medication compliance with the statin. Significant abdominal symptoms, memory deficit, or myalgias denied. BP is not monitored. .     Objective:   Physical Exam  Appears healthy and well-nourished & in no acute distress.Appears younger than stated age  No carotid bruits are present.No neck pain distention present at 10 - 15 degrees. Thyroid normal to palpation  Heart rhythm and rate are normal with no significant murmurs or gallops.  Chest is clear with no increased work of breathing  There is no evidence of aortic aneurysm or renal artery bruits  Abdomen soft with no organomegaly or masses. No HJR  No clubbing, cyanosis or edema present.  Pedal pulses are intact   No ischemic skin changes are present . Nails healthy   Alert and oriented. Strength, tone, DTRs reflexes normal          Assessment & Plan:  See Current Assessment & Plan in Problem List under specific Diagnosis

## 2013-05-04 LAB — VITAMIN D 1,25 DIHYDROXY
Vitamin D2 1, 25 (OH)2: <8 pg/mL
Vitamin D3 1, 25 (OH)2: 63 pg/mL

## 2013-05-28 ENCOUNTER — Ambulatory Visit: Payer: 59 | Admitting: Internal Medicine

## 2013-06-22 ENCOUNTER — Other Ambulatory Visit: Payer: Self-pay | Admitting: Internal Medicine

## 2013-06-22 NOTE — Telephone Encounter (Signed)
Entered by Pecola Lawless, MD at 04/30/2013 5:59 PM  Because of the mild elevation of the muscle enzyme (CK) I would recommend Livalo 2 mg in place of the atorvastatin if this is on [facility]'s formulary.This may have less risk of raising muscle enzyme. After 10 weeks of the medication change; CK and NMR Liporofile could be rechecked. The advanced cholesterol test could optimally assess need for cholesterol medication and long-term risk.   Hop this patient has not viewed these results on My-chart and it does not look like the Livalo was ever sent to the pharmacy. A refill request came in for Lipitor 20 mg. Would you like to fill or would you link to change the Rx. Please advise      KP

## 2013-06-22 NOTE — Telephone Encounter (Signed)
Atorvastatin can be refilled; but if having muscle pain the CK should be rechecked .Consider changing atorvastatin to Livalo if CK remains elevated as per 7/14 note

## 2013-06-27 ENCOUNTER — Other Ambulatory Visit: Payer: Self-pay | Admitting: Internal Medicine

## 2013-07-04 NOTE — Telephone Encounter (Signed)
Med filled.  

## 2013-08-03 ENCOUNTER — Other Ambulatory Visit: Payer: Self-pay | Admitting: *Deleted

## 2013-08-03 MED ORDER — ZOLPIDEM TARTRATE 10 MG PO TABS: 10.0000 mg | ORAL_TABLET | Freq: Every day | ORAL | Status: DC | PRN

## 2013-08-13 ENCOUNTER — Telehealth: Payer: Self-pay

## 2013-08-13 NOTE — Telephone Encounter (Signed)
Called the patient about her [place]

## 2013-08-16 ENCOUNTER — Other Ambulatory Visit: Payer: Self-pay

## 2013-10-08 ENCOUNTER — Other Ambulatory Visit: Payer: Self-pay | Admitting: Cardiology

## 2013-10-15 ENCOUNTER — Ambulatory Visit: Payer: Self-pay | Admitting: Podiatry

## 2013-10-17 ENCOUNTER — Ambulatory Visit: Payer: Self-pay | Admitting: Podiatry

## 2013-10-18 ENCOUNTER — Ambulatory Visit: Payer: Self-pay | Admitting: Podiatry

## 2013-10-25 ENCOUNTER — Ambulatory Visit: Payer: 59 | Admitting: Cardiology

## 2013-12-02 ENCOUNTER — Emergency Department (HOSPITAL_COMMUNITY): Payer: 59

## 2013-12-02 ENCOUNTER — Emergency Department (HOSPITAL_COMMUNITY)
Admission: EM | Admit: 2013-12-02 | Discharge: 2013-12-02 | Disposition: A | Discharge status: Home / Self Care | Payer: 59 | Attending: Emergency Medicine | Admitting: Emergency Medicine

## 2013-12-02 ENCOUNTER — Encounter (HOSPITAL_COMMUNITY): Payer: Self-pay | Admitting: Emergency Medicine

## 2013-12-02 DIAGNOSIS — E785 Hyperlipidemia, unspecified: Secondary | ICD-10-CM | POA: Insufficient documentation

## 2013-12-02 DIAGNOSIS — R52 Pain, unspecified: Secondary | ICD-10-CM | POA: Insufficient documentation

## 2013-12-02 DIAGNOSIS — R059 Cough, unspecified: Secondary | ICD-10-CM | POA: Insufficient documentation

## 2013-12-02 DIAGNOSIS — K219 Gastro-esophageal reflux disease without esophagitis: Secondary | ICD-10-CM | POA: Insufficient documentation

## 2013-12-02 DIAGNOSIS — Z79899 Other long term (current) drug therapy: Secondary | ICD-10-CM | POA: Insufficient documentation

## 2013-12-02 DIAGNOSIS — R509 Fever, unspecified: Secondary | ICD-10-CM | POA: Insufficient documentation

## 2013-12-02 DIAGNOSIS — Z860100 Personal history of colon polyps, unspecified: Secondary | ICD-10-CM | POA: Insufficient documentation

## 2013-12-02 DIAGNOSIS — Z7982 Long term (current) use of aspirin: Secondary | ICD-10-CM | POA: Insufficient documentation

## 2013-12-02 DIAGNOSIS — R05 Cough: Secondary | ICD-10-CM

## 2013-12-02 DIAGNOSIS — I10 Essential (primary) hypertension: Secondary | ICD-10-CM | POA: Insufficient documentation

## 2013-12-02 DIAGNOSIS — Z853 Personal history of malignant neoplasm of breast: Secondary | ICD-10-CM | POA: Insufficient documentation

## 2013-12-02 DIAGNOSIS — R6883 Chills (without fever): Secondary | ICD-10-CM

## 2013-12-02 DIAGNOSIS — J029 Acute pharyngitis, unspecified: Secondary | ICD-10-CM | POA: Insufficient documentation

## 2013-12-02 LAB — URINALYSIS, ROUTINE W REFLEX MICROSCOPIC
BILIRUBIN URINE: NEGATIVE
GLUCOSE, UA: NEGATIVE mg/dL
KETONES UR: NEGATIVE mg/dL
Leukocytes, UA: NEGATIVE
Nitrite: NEGATIVE
PH: 6.5 (ref 5.0–8.0)
PROTEIN: NEGATIVE mg/dL
Specific Gravity, Urine: 1.016 (ref 1.005–1.030)
Urobilinogen, UA: 1 mg/dL (ref 0.0–1.0)

## 2013-12-02 LAB — URINE MICROSCOPIC-ADD ON

## 2013-12-02 MED ORDER — GUAIFENESIN-CODEINE 100-10 MG/5ML PO SOLN: 10.0000 mL | Freq: Every evening | ORAL | Status: DC | PRN

## 2013-12-02 MED ORDER — GUAIFENESIN-CODEINE 100-10 MG/5ML PO SOLN
10.0000 mL | Freq: Once | ORAL | Status: AC
  Administered 2013-12-02: 10 mL via ORAL
  Filled 2013-12-02: qty 10

## 2013-12-02 NOTE — ED Notes (Signed)
The pt has been ill with multiple symptoms since Friday.  Chills and feeling hot with a cough  Headache yesterday.  Chest congestion and pain with her cough and aching all over.

## 2013-12-02 NOTE — ED Provider Notes (Signed)
I saw and evaluated the patient, reviewed the resident's note and I agree with the findings and plan. If applicable, I agree with the resident's interpretation of the EKG.  If applicable, I was present for critical portions of any procedures performed.  2 day history of upper respiratory congestion, cough, chills, body aches subjective fever. No chest pain no shortness of breath.  Ezequiel Essex, MD 12/02/13 [phone]

## 2013-12-02 NOTE — ED Provider Notes (Signed)
CSN: [id number]     Arrival date & time 12/02/13  1908 History   First MD Initiated Contact with Patient 12/02/13 1942     Chief Complaint  Patient presents with  . multiple symptoms       Patient is a 63 y.o. female presenting with cough.  Cough Cough characteristics:  Non-productive Severity:  Moderate Onset quality:  Gradual Duration:  3 days Timing:  Intermittent Progression:  Worsening Chronicity:  New Smoker: no   Context: sick contacts and upper respiratory infection   Context: not animal exposure   Relieved by:  Cough suppressants Worsened by:  Lying down Associated symptoms: chills, fever, headaches, myalgias, sinus congestion and sore throat   Associated symptoms: no chest pain, no diaphoresis, no ear fullness, no ear pain, no eye discharge, no rash, no rhinorrhea, no shortness of breath, no weight loss and no wheezing       Past Medical History  Diagnosis Date  . History of breast cancer 2006  . GERD (gastroesophageal reflux disease)   . History of colonic polyps   . HTN (hypertension)   . HLD (hyperlipidemia)   . H/O: hysterectomy   . Chest pain    Past Surgical History  Procedure Laterality Date  . Breast lumpectomy  2006  . Colonoscopy w/ polypectomy  2004, 2009  . Tubal ligation    . Abdominal hysterectomy     Family History  Problem Relation Age of Onset  . Heart attack Father 51  . Hypertension Mother   . Coronary artery disease Mother   . Aneurysm Brother   . Asthma Other     nephew  . Hyperlipidemia Other     nephew  . Sudden death Other     nephew  . Colon cancer Neg Hx    History  Substance Use Topics  . Smoking status: Never Smoker   . Smokeless tobacco: Never Used  . Alcohol Use: 0.0 oz/week     Comment: rarely   OB History   Grav Para Term Preterm Abortions TAB SAB Ect Mult Living                 Review of Systems  Constitutional: Positive for fever and chills. Negative for weight loss, diaphoresis, activity change and  appetite change.  HENT: Positive for sore throat. Negative for congestion, ear pain and rhinorrhea.   Eyes: Negative for pain and discharge.  Respiratory: Positive for cough. Negative for shortness of breath and wheezing.   Cardiovascular: Negative for chest pain and palpitations.  Gastrointestinal: Negative for nausea, vomiting and abdominal pain.  Genitourinary: Negative for dysuria, difficulty urinating and pelvic pain.  Musculoskeletal: Positive for myalgias. Negative for back pain and neck pain.  Skin: Negative for rash and wound.  Neurological: Positive for headaches. Negative for weakness.  Psychiatric/Behavioral: Negative for behavioral problems, confusion and agitation.      Allergies  Review of patient's allergies indicates no known allergies.  Home Medications   Current Outpatient Rx  Name  Route  Sig  Dispense  Refill  . amLODipine (NORVASC) 10 MG tablet   Oral   Take 10 mg by mouth daily.         [name] aspirin 81 MG tablet   Oral   Take 1 tablet (81 mg total) by mouth daily.   30 tablet   6   . atorvastatin (LIPITOR) 20 MG tablet   Oral   Take 20 mg by mouth daily.         [name]  cetirizine (ZYRTEC) 10 MG tablet   Oral   Take 10 mg by mouth daily.         . Chlorphen-Pseudoephed-APAP (THERAFLU FLU/COLD PO)   Oral   Take 1 Package by mouth every 6 (six) hours as needed (cold/cough).         . Cholecalciferol (VITAMIN D3) 1000 UNITS CAPS   Oral   Take 1 capsule by mouth daily.          [name] Dextromethorphan Polistirex (DELSYM PO)   Oral   Take 10 mg by mouth every 12 (twelve) hours as needed (cold/cough).         . labetalol (NORMODYNE) 300 MG tablet   Oral   Take 300 mg by mouth 2 (two) times daily.         . pantoprazole (PROTONIX) 40 MG tablet   Oral   Take 1 tablet (40 mg total) by mouth daily.   90 tablet   3   . Pseudoephedrine-Guaifenesin (MUCINEX D PO)   Oral   Take 1 tablet by mouth every 4 (four) hours as needed (cough).          [name] spironolactone (ALDACTONE) 25 MG tablet   Oral   Take 25 mg by mouth daily.         [name] zolpidem (AMBIEN) 10 MG tablet   Oral   Take 1 tablet (10 mg total) by mouth daily as needed.   30 tablet   0    BP 176/99  Pulse 100  Temp(Src) 99.6 F (37.6 C)  Resp 16  SpO2 99% Physical Exam  Constitutional: She is oriented to person, place, and time. She appears well-developed and well-nourished. No distress.  HENT:  Head: Normocephalic and atraumatic.  Nose: Nose normal.  Mouth/Throat: Oropharynx is clear and moist.  Eyes: EOM are normal. Pupils are equal, round, and reactive to light.  Neck: Normal range of motion. Neck supple. No tracheal deviation present.  Cardiovascular: Normal rate, regular rhythm, normal heart sounds and intact distal pulses.   Pulmonary/Chest: Effort normal and breath sounds normal. She has no rales.  Abdominal: Soft. Bowel sounds are normal. She exhibits no distension. There is no tenderness. There is no rebound and no guarding.  Musculoskeletal: Normal range of motion. She exhibits no tenderness.  Neurological: She is alert and oriented to person, place, and time.  Skin: Skin is warm and dry. No rash noted.  Psychiatric: She has a normal mood and affect. Her behavior is normal.    ED Course  Procedures (including critical care time) Labs Review Labs Reviewed  URINALYSIS, ROUTINE W REFLEX MICROSCOPIC - Abnormal; Notable for the following:    APPearance CLOUDY (*)    Hgb urine dipstick SMALL (*)    All other components within normal limits  URINE MICROSCOPIC-ADD ON - Abnormal; Notable for the following:    Squamous Epithelial / LPF FEW (*)    All other components within normal limits   Imaging Review Dg Chest 2 View  12/02/2013   CLINICAL DATA:  Three day history of fever, cough and sore throat.  EXAM: CHEST  2 VIEW  COMPARISON:  Two-view chest x-ray 10/29/2012. Coronary artery CTA 01/20/2012.  FINDINGS: Cardiac silhouette normal in size,  unchanged. Thoracic aorta mildly tortuous, unchanged. Hilar and mediastinal contours otherwise unremarkable. Lungs clear. Bronchovascular markings normal. Pulmonary vascularity normal. No visible pleural effusions. No pneumothorax. Degenerative changes involving the thoracic spine and exaggeration of the usual thoracic kyphosis.  IMPRESSION: No acute cardiopulmonary disease.  Stable examination.   Electronically Signed   By: Evangeline Dakin M.D.   On: 12/02/2013 21:26     MDM   Final diagnoses:  Cough  Body aches  Chills  Fever    63 yo F with 4 days hx of cough, fevers and myalgias. Reported sore throat but improved now. Exam with no exudates, no cervical lymphadenitis and with cough doubt strep pharyngitis. Lungs CTAB CXR with no infiltrate doubt PNA. No reported CP no SOB doubt PE. UA without UTI.  Suspect viral syndrome for current sxs. Thorough discussion with patient on plan, findings, return precautions. Case discussed with my attending Dr. Wyvonnia Dusky.     Ruthell Rummage, MD 12/02/13 2139

## 2013-12-02 NOTE — Discharge Instructions (Signed)
Cough, Adult  A cough is a reflex. It helps you clear your throat and airways. A cough can help heal your body. A cough can last 2 or 3 weeks (acute) or may last more than 8 weeks (chronic). Some common causes of a cough can include an infection, allergy, or a cold. HOME CARE  Only take medicine as told by your doctor.  If given, take your medicines (antibiotics) as told. Finish them even if you start to feel better.  Use a cold steam vaporizer or humidier in your home. This can help loosen thick spit (secretions).  Sleep so you are almost sitting up (semi-upright). Use pillows to do this. This helps reduce coughing.  Rest as needed.  Stop smoking if you smoke. GET HELP RIGHT AWAY IF:  You have yellowish-white fluid (pus) in your thick spit.  Your cough gets worse.  Your medicine does not reduce coughing, and you are losing sleep.  You cough up blood.  You have trouble breathing.  Your pain gets worse and medicine does not help.  You have a fever. MAKE SURE YOU:   Understand these instructions.  Will watch your condition.  Will get help right away if you are not doing well or get worse. Document Released: 06/10/2011 Document Revised: 12/20/2011 Document Reviewed: 06/10/2011 ExitCare Patient Information 2014 [employer].  

## 2013-12-02 NOTE — ED Notes (Signed)
Pt taken to radiology

## 2013-12-03 ENCOUNTER — Ambulatory Visit: Payer: Commercial Managed Care - PPO | Admitting: Podiatry

## 2013-12-04 ENCOUNTER — Ambulatory Visit (INDEPENDENT_AMBULATORY_CARE_PROVIDER_SITE_OTHER): Payer: 59 | Admitting: Internal Medicine

## 2013-12-04 ENCOUNTER — Encounter: Payer: Self-pay | Admitting: Internal Medicine

## 2013-12-04 VITALS — BP 130/90 | HR 90 | Temp 98.2°F | Resp 12 | Wt 179.2 lb

## 2013-12-04 DIAGNOSIS — R05 Cough: Secondary | ICD-10-CM

## 2013-12-04 DIAGNOSIS — R059 Cough, unspecified: Secondary | ICD-10-CM

## 2013-12-04 DIAGNOSIS — M2550 Pain in unspecified joint: Secondary | ICD-10-CM

## 2013-12-04 DIAGNOSIS — IMO0001 Reserved for inherently not codable concepts without codable children: Secondary | ICD-10-CM

## 2013-12-04 DIAGNOSIS — M791 Myalgia, unspecified site: Secondary | ICD-10-CM

## 2013-12-04 DIAGNOSIS — J111 Influenza due to unidentified influenza virus with other respiratory manifestations: Secondary | ICD-10-CM

## 2013-12-04 MED ORDER — OSELTAMIVIR PHOSPHATE 75 MG PO CAPS: 75.0000 mg | ORAL_CAPSULE | Freq: Two times a day (BID) | ORAL | Status: DC

## 2013-12-04 NOTE — Progress Notes (Signed)
Pre visit review using our clinic review tool, if applicable. No additional management support is needed unless otherwise documented below in the visit note. 

## 2013-12-04 NOTE — Patient Instructions (Signed)
NSAIDS ( Aleve, Advil, Naproxen) or Tylenol every 4 hrs as needed for fever,myalgias or arthralgias as discussed based on label recommendations Please remain out of work until 12/06/13.

## 2013-12-04 NOTE — Progress Notes (Signed)
   Subjective:    Patient ID: Laura Poole, female    DOB: October 17, 1950, 63 y.o.   MRN: [id number]  HPI  She is here to followup respiratory tract symptoms which have not improved. She was seen in emergency room 2/22 for the nonproductive cough which had begun 2/19 it had been progressive. It is described as intermittent and nonproductive.  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 She also describes a frontal headache and sore throat.   Chest xray revealed no active disease. Film was personally reviewed. It does show a exaggerated kyphotic change of the thoracic spine with some degenerative change.  Urinalysis was done although she had no genitourinary symptoms  The only change is that she now has night sweats & of  marked arthralgias and myalgias  as of 2/23.  The emergency room note states that she had myalgias when seen; she denies this. The cough remains unchanged  She has had the flu shot.    Review of Systems  She specifically denies frontal headache, facial pain, nasal purulence, dental pain, sore throat, otic pain, otic discharge at this time.  She will have some discomfort  below the right ear with coughing     Objective:   Physical Exam General appearance:good health ;well nourished; no acute distress or increased work of breathing is present.  No  lymphadenopathy about the head, neck, or axilla noted.   Eyes: No conjunctival inflammation or lid edema is present.  Ears:  External ear exam shows no significant lesions or deformities.  Otoscopic examination reveals clear canals, tympanic membranes are intact bilaterally without bulging, retraction, inflammation or discharge. TMs dull  Nose:  External nasal examination shows no deformity or inflammation. Nasal mucosa are pink and moist without lesions or exudates. No septal dislocation or deviation.No obstruction to airflow.    Oral exam: Dental hygiene is good; lips and gums are healthy appearing.There is no oropharyngeal erythema or exudate noted.   Neck:  No deformities,  masses, or tenderness noted.   Supple with full range of motion without pain.   Heart:  Normal rate and regular rhythm. S1 and S2 normal without gallop, murmur, click, rub or other extra sounds.   Lungs:Chest clear to auscultation; no wheezes, rhonchi,rales ,or rubs present.No increased work of breathing.    Extremities:  No cyanosis, edema, or clubbing  noted    Skin: Warm & dry .         Assessment & Plan:  #1 flu syndrome with myalgias, arthralgias, and cough. No clinical symptoms of rhinosinusitis. The clinical picture is modified by the fact that she did have the flu shot.  See orders

## 2013-12-05 ENCOUNTER — Ambulatory Visit: Payer: Commercial Managed Care - PPO | Admitting: Podiatry

## 2013-12-06 ENCOUNTER — Ambulatory Visit: Payer: Commercial Managed Care - PPO | Admitting: Podiatry

## 2013-12-20 ENCOUNTER — Other Ambulatory Visit: Payer: Self-pay | Admitting: Internal Medicine

## 2013-12-20 ENCOUNTER — Other Ambulatory Visit: Payer: Self-pay | Admitting: Cardiology

## 2013-12-27 ENCOUNTER — Ambulatory Visit: Payer: 59 | Admitting: Podiatry

## 2014-01-14 ENCOUNTER — Telehealth: Payer: Self-pay | Admitting: *Deleted

## 2014-01-14 MED ORDER — ZOLPIDEM TARTRATE 10 MG PO TABS: 10.0000 mg | ORAL_TABLET | Freq: Every day | ORAL | Status: DC | PRN

## 2014-01-14 NOTE — Telephone Encounter (Signed)
Dr Linna Darner is PCP. Will forward to Dr Aundra Dubin to see if he wants to refill Ambien.

## 2014-01-14 NOTE — Telephone Encounter (Signed)
Called prescription to [facility] for Ambien 10mg  #30 HS prn sleep 0 refills

## 2014-01-14 NOTE — Telephone Encounter (Signed)
We can refill Ambien for her

## 2014-01-14 NOTE — Telephone Encounter (Signed)
30 is fine

## 2014-01-14 NOTE — Telephone Encounter (Signed)
Looks like it was [place] 10mg  HS prn. Do you want me to refill 10mg  HS PRN and how many do you want me to prescribe for her?

## 2014-01-14 NOTE — Telephone Encounter (Signed)
Calling to have Ambien refilled. Was told by Dr Aundra Dubin to directly call [facility] for refill. If need to reach before 3 pm call [phone] Will route this to [facility].

## 2014-01-23 ENCOUNTER — Ambulatory Visit: Payer: 59 | Admitting: Podiatry

## 2014-01-29 ENCOUNTER — Telehealth: Payer: Self-pay | Admitting: *Deleted

## 2014-01-29 NOTE — Telephone Encounter (Signed)
Patient requests ambien refill be sent to [facility].  She also stated that she spoke with Dr Aundra Dubin and he told her that it is ok to refill all of her medications as the pharmacy told her that she needs a visit. I made her aware that she has not been seen in 2 years, but she stated that is because Dr Sheliah Hatch sees her over at the hospital and asks her if she is doing ok. She stated that she was going to schedule an office visit and I told her that we could refill her meds until then.  Thanks, MI

## 2014-02-01 DIAGNOSIS — M779 Enthesopathy, unspecified: Secondary | ICD-10-CM

## 2014-02-07 ENCOUNTER — Telehealth: Payer: Self-pay | Admitting: Internal Medicine

## 2014-02-07 NOTE — Telephone Encounter (Signed)
Pt wants to be referred to some to treat arthritis.

## 2014-02-07 NOTE — Telephone Encounter (Signed)
Ask to see Dr Charlann Boxer  [phone]

## 2014-02-08 NOTE — Telephone Encounter (Signed)
Spoke with pt, appointment scheduled. 

## 2014-02-13 ENCOUNTER — Telehealth: Payer: Self-pay | Admitting: Family Medicine

## 2014-02-13 ENCOUNTER — Ambulatory Visit (INDEPENDENT_AMBULATORY_CARE_PROVIDER_SITE_OTHER): Payer: 59 | Admitting: Family Medicine

## 2014-02-13 ENCOUNTER — Encounter: Payer: Self-pay | Admitting: Family Medicine

## 2014-02-13 VITALS — BP 170/104 | HR 81 | Wt 175.0 lb

## 2014-02-13 DIAGNOSIS — M549 Dorsalgia, unspecified: Secondary | ICD-10-CM

## 2014-02-13 DIAGNOSIS — M4000 Postural kyphosis, site unspecified: Secondary | ICD-10-CM

## 2014-02-13 DIAGNOSIS — M2140 Flat foot [pes planus] (acquired), unspecified foot: Secondary | ICD-10-CM

## 2014-02-13 NOTE — Telephone Encounter (Signed)
Patient can if she wants, it is really up to her.

## 2014-02-13 NOTE — Telephone Encounter (Signed)
Pt was wondering if she still need to see the foot doctor on 02/27/14. Please call pt, ok to leave detail massage

## 2014-02-13 NOTE — Patient Instructions (Signed)
Very nice to meet you Try exercises 3 times a week.  On wall heels, butt, shoulders and head against wall for 5 minutes Pump the chair with both hands and do everything square for now.  Tennis ball to back of chair between shoulder blades.  Message could help Ice at the end of a long day could help.  Take tylenol 650 mg three times a day is the best evidence based medicine we have for arthritis.  Glucosamine sulfate 750mg  twice a day is a supplement that has been shown to help moderate to severe arthritis. Vitamin D 2000 IU daily Fish oil 2 grams daily.  Tumeric 500mg  twice daily.  Capsaicin topically up to four times a day may also help with pain. It's important that you continue to stay active. Consider physical therapy to strengthen muscles around the joint that hurts to take pressure off of the joint itself. Shoe inserts with good arch support may be helpful.  [employer] orthotics at [employer] could help.  Water aerobics and cycling with low resistance are the best two types of exercise for arthritis. Come back and see me in 3 weeks.  We may start manipulation at that time.

## 2014-02-13 NOTE — Assessment & Plan Note (Signed)
Patient's back pain is likely multifactorial. Patient does have pes planus bilaterally that could be contributing as well as increase in kyphosis and poor postural muscle tone. Patient given home exercise program, we discussed proper shoe wear an orthotic that can help with alignment, patient given postural exercises as well. Patient also has a past medical history for breast cancer so we will get x-rays to rule out any bony metastasis. Patient has a history of vitamin D and will increase her 2 2000 units daily in the discussed other over-the-counter medications. Patient does give a small family history of her mother having some type of arthritis in the patient does not get any significant improvement we may also want to consider labs. Patient does have an elevated CK level before him he may want to consider a CRP, ESR, ANA and other autoimmune workup if necessary. If workup is completely neutral we may want to consider osteopathic manipulation. Patient will try these interventions and come back again in 3 weeks for further evaluation and treatment.

## 2014-02-13 NOTE — Progress Notes (Signed)
  [facility] [place], [place] [address] Phone: [phone] Subjective:    I'm seeing this patient by the request  of:  Unice Cobble, MD   CC: Back pain  [id number] Laura Poole is a 63 y.o. female coming in with complaint of back pain. Patient does have a past medical history significant for breast cancer in 2006 status post lumpectomy. Patient states that she does have a lot of aches and pains overall as well. Patient states most of it seems to be midthoracic spine. Patient has some low back pain as well. Patient denies any radiation to the extremities he denies any numbness or weakness of the extremity. Patient does not remember and true injury. Patient denies any nighttime pain and it seems like it is worse with work. 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 Patient is a severity is 8/10 at the end of a long day. Denies that stops her from any regular activities. Patient has tried ibuprofen and has been taking 6 pills daily together today.    Past medical history, social, surgical and family history all reviewed in electronic medical record.   Review of Systems: No headache, visual changes, nausea, vomiting, diarrhea, constipation, dizziness, abdominal pain, skin rash, fevers, chills, night sweats, weight loss, swollen lymph nodes, body aches, joint swelling, muscle aches, chest pain, shortness of breath, mood changes.   Objective Blood pressure 170/104, pulse 81, weight 175 lb (79.379 kg), SpO2 93.00%.  General: No apparent distress alert and oriented x3 mood and affect normal, dressed appropriately.  HEENT: Pupils equal, extraocular movements intact  Respiratory: Patient's speak in full sentences and does not appear short of breath  Cardiovascular: No lower extremity edema, non tender, no erythema  Skin: Warm dry intact with no signs of infection or rash on extremities or on axial skeleton.  Abdomen: Soft  nontender  Neuro: Cranial nerves II through XII are intact, neurovascularly intact in all extremities with 2+ DTRs and 2+ pulses.  Lymph: No lymphadenopathy of posterior or anterior cervical chain or axillae bilaterally.  Gait normal with good balance and coordination.  MSK:  Non tender with full range of motion and good stability and symmetric strength and tone of shoulders, elbows, wrist, hip, knee and ankles bilaterally.  Back Exam:  Inspection: Excessive kyphosis of the thoracic spine  Motion: Flexion 35 deg, Extension 25 deg, Side Bending to 25 deg bilaterally,  Rotation to 35 deg bilaterally  SLR laying: Negative  XSLR laying: Negative  Palpable tenderness: Patient does have multiple points of tenderness bilaterally down thoracic and lumbar spine. Patient does have some mild increase in hypertonicity of the muscles but no specific trigger points noted.[name] FABER: negative. Sensory change: Gross sensation intact to all lumbar and sacral dermatomes.  Reflexes: 2+ at both patellar tendons, 2+ at achilles tendons, Babinski's downgoing.  Strength at foot  Plantar-flexion: 5/5 Dorsi-flexion: 5/5 Eversion: 5/5 Inversion: 5/5  Leg strength  Quad: 5/5 Hamstring: 5/5 Hip flexor: 5/5 Hip abductors: 5/5  Gait unremarkable.    Impression and Recommendations:     This case required medical decision making of moderate complexity.

## 2014-02-13 NOTE — Assessment & Plan Note (Signed)
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 Patient told about proper shoe choices and given information about orthotics. Patient may need custom orthotics in the long run.

## 2014-02-13 NOTE — Assessment & Plan Note (Signed)
Patient does have excessive kyphosis. We are getting x-rays to rule out schmoral nodes. Discussed postural position at work and get another strength training for the upper back.

## 2014-02-14 NOTE — Telephone Encounter (Signed)
Pt is aware.  

## 2014-02-25 ENCOUNTER — Other Ambulatory Visit: Payer: Self-pay | Admitting: *Deleted

## 2014-02-25 MED ORDER — ZOLPIDEM TARTRATE 10 MG PO TABS: 10.0000 mg | ORAL_TABLET | Freq: Every day | ORAL | Status: DC | PRN

## 2014-03-05 ENCOUNTER — Other Ambulatory Visit: Payer: Self-pay

## 2014-03-05 MED ORDER — LABETALOL HCL 300 MG PO TABS: 300.0000 mg | ORAL_TABLET | Freq: Two times a day (BID) | ORAL | Status: DC

## 2014-03-06 ENCOUNTER — Encounter: Payer: Self-pay | Admitting: Family Medicine

## 2014-03-06 ENCOUNTER — Ambulatory Visit (INDEPENDENT_AMBULATORY_CARE_PROVIDER_SITE_OTHER): Payer: 59 | Admitting: Family Medicine

## 2014-03-06 ENCOUNTER — Encounter: Payer: Self-pay | Admitting: *Deleted

## 2014-03-06 VITALS — BP 152/98 | HR 79 | Ht 67.5 in | Wt 175.0 lb

## 2014-03-06 DIAGNOSIS — M4000 Postural kyphosis, site unspecified: Secondary | ICD-10-CM

## 2014-03-06 DIAGNOSIS — M999 Biomechanical lesion, unspecified: Secondary | ICD-10-CM | POA: Insufficient documentation

## 2014-03-06 NOTE — Progress Notes (Signed)
  [name] [place], [place] [address] Phone: [phone] Subjective:     CC: Back pain  IEP:[id number] Laura Poole is a 63 y.o. female coming in with complaint of back pain. Patient does have a past medical history significant for breast cancer in 2006 status post lumpectomy. Patient was seen previously and was given conservative therapy with recommendations of over-the-counter medications. Patient states as long as she takes Tylenol on a regular basis she is about 80% better. Patient notices though if she tries to do any patient transverse by herself she has significant amount of pain after that as well as multiple days after that. Patient has been trying to do some the exercises that were recommended but has not been doing them on a regular basis. Denies any new symptoms. Patient did have x-rays ordered but patient did not have them done.    Past medical history, social, surgical and family history all reviewed in electronic medical record.   Review of Systems: No headache, visual changes, nausea, vomiting, diarrhea, constipation, dizziness, abdominal pain, skin rash, fevers, chills, night sweats, weight loss, swollen lymph nodes, body aches, joint swelling, muscle aches, chest pain, shortness of breath, mood changes.   Objective Blood pressure 152/98, pulse 79, height 5' 7.5" (1.715 m), weight 175 lb (79.379 kg), SpO2 97.00%.  General: No apparent distress alert and oriented x3 mood and affect normal, dressed appropriately.  HEENT: Pupils equal, extraocular movements intact  Respiratory: Patient's speak in full sentences and does not appear short of breath  Cardiovascular: No lower extremity edema, non tender, no erythema  Skin: Warm dry intact with no signs of infection or rash on extremities or on axial skeleton.  Abdomen: Soft nontender  Neuro: Cranial nerves II through XII are intact, neurovascularly intact in all extremities with 2+  DTRs and 2+ pulses.  Lymph: No lymphadenopathy of posterior or anterior cervical chain or axillae bilaterally.  Gait normal with good balance and coordination.  MSK:  Non tender with full range of motion and good stability and symmetric strength and tone of shoulders, elbows, wrist, hip, knee and ankles bilaterally.  Back Exam:  Inspection: Excessive kyphosis of the thoracic spine  Motion: Flexion 35 deg, Extension 25 deg, Side Bending to 25 deg bilaterally,  Rotation to 35 deg bilaterally  SLR laying: Negative  XSLR laying: Negative  Palpable tenderness: Patient does have multiple points of tenderness bilaterally down thoracic and lumbar spine. Patient does have some mild increase in hypertonicity of the muscles but no specific trigger points noted.[name] FABER: negative. Sensory change: Gross sensation intact to all lumbar and sacral dermatomes.  Reflexes: 2+ at both patellar tendons, 2+ at achilles tendons, Babinski's downgoing.  Strength at foot  Plantar-flexion: 5/5 Dorsi-flexion: 5/5 Eversion: 5/5 Inversion: 5/5  Leg strength  Quad: 5/5 Hamstring: 5/5 Hip flexor: 5/5 Hip abductors: 5/5  Gait unremarkable.  OMT Physical Exam  Standing structural       Occiput right higher  Shoulder left higher    Standing flexion  left Seated Flexion Left   Cervical  Neutral Thoracic Patient does have increasing kyphosis with a group curve from T4-T8 rotated right side bent left  Lumbar L5 rotated inside that right  Sacrum Left on left  Illium       Impression and Recommendations:     This case required medical decision making of moderate complexity.

## 2014-03-06 NOTE — Patient Instructions (Signed)
You are doing great overall Would continue the tylenol and vitamin D and Turmeric for sure.  Consider Salonpas patches you can wear at work.  Ice 20 minutes after work would be helpful.  Posture on wall .  Other exercises 3 times a week.  Come back again in 3 weeks.

## 2014-03-06 NOTE — Assessment & Plan Note (Signed)
Discussed with patient at great length. Encourage patient more postural exercises. Patient is doing some different exercises but encourage her to do more regularly. We discussed proper lifting positioning at work. At this point I would like patient to have a 50 pound lifting limit a long at any of her jobs. Patient will continue the over-the-counter medications and did respond fairly well to osteopathic manipulation. Patient will then come back again in 3 weeks for further evaluation and treatment.  Spent greater than 25 minutes with patient face-to-face and had greater than 50% of counseling including as described above in assessment and plan.

## 2014-03-06 NOTE — Assessment & Plan Note (Signed)
Decision today to treat with OMT was based on Physical Exam  After verbal consent patient was treated with HVLA, ME techniques in thoracic, lumbar, sacrum areas  Patient tolerated the procedure well with improvement in symptoms  Patient given exercises, stretches and lifestyle modifications  See medications in patient instructions if given  Patient will follow up in 3 weeks

## 2014-03-13 ENCOUNTER — Telehealth: Payer: Self-pay | Admitting: *Deleted

## 2014-03-13 MED ORDER — CYCLOBENZAPRINE HCL 10 MG PO TABS: 10.0000 mg | ORAL_TABLET | Freq: Three times a day (TID) | ORAL | Status: DC | PRN

## 2014-03-13 MED ORDER — MELOXICAM 15 MG PO TABS: 15.0000 mg | ORAL_TABLET | Freq: Every day | ORAL | Status: DC

## 2014-03-13 NOTE — Telephone Encounter (Signed)
Pt left msg stating that her lower back is bothering her again & would like something sent into the pharmacy to help with the pain.

## 2014-03-13 NOTE — Telephone Encounter (Signed)
Left msg to make pt aware. °

## 2014-03-13 NOTE — Telephone Encounter (Signed)
Sent in 2 medicines Please call patient.

## 2014-03-20 ENCOUNTER — Other Ambulatory Visit: Payer: Self-pay

## 2014-03-20 DIAGNOSIS — Z1231 Encounter for screening mammogram for malignant neoplasm of breast: Secondary | ICD-10-CM

## 2014-04-16 ENCOUNTER — Ambulatory Visit: Payer: 59

## 2014-04-18 ENCOUNTER — Telehealth: Payer: Self-pay | Admitting: Family Medicine

## 2014-04-18 MED ORDER — MELOXICAM 15 MG PO TABS: 15.0000 mg | ORAL_TABLET | Freq: Every day | ORAL | Status: DC

## 2014-04-18 MED ORDER — CYCLOBENZAPRINE HCL 10 MG PO TABS: 10.0000 mg | ORAL_TABLET | Freq: Three times a day (TID) | ORAL | Status: DC | PRN

## 2014-04-18 NOTE — Telephone Encounter (Signed)
Refill done. Pt made aware.

## 2014-04-18 NOTE — Telephone Encounter (Signed)
Pt came in to inform Dr Tamala Julian that the Rx he has provided her with are working fine but she has no more refills on her meds. Pt wants to know if she needs to schedule a follow up appt or request more refills. Please contact pt when request is reviewed.

## 2014-04-22 ENCOUNTER — Other Ambulatory Visit: Payer: Self-pay | Admitting: Cardiology

## 2014-04-22 NOTE — Telephone Encounter (Signed)
Yes may refill. 

## 2014-04-26 ENCOUNTER — Other Ambulatory Visit: Payer: Self-pay

## 2014-04-26 MED ORDER — ZOLPIDEM TARTRATE 10 MG PO TABS: 10.0000 mg | ORAL_TABLET | Freq: Every day | ORAL | Status: DC | PRN

## 2014-05-27 ENCOUNTER — Other Ambulatory Visit: Payer: Self-pay

## 2014-05-27 DIAGNOSIS — Z1231 Encounter for screening mammogram for malignant neoplasm of breast: Secondary | ICD-10-CM

## 2014-05-27 DIAGNOSIS — Z853 Personal history of malignant neoplasm of breast: Secondary | ICD-10-CM

## 2014-05-29 ENCOUNTER — Ambulatory Visit
Admission: RE | Admit: 2014-05-29 | Discharge: 2014-05-29 | Disposition: A | Discharge status: Home / Self Care | Payer: 59 | Source: Ambulatory Visit

## 2014-05-29 DIAGNOSIS — Z1231 Encounter for screening mammogram for malignant neoplasm of breast: Secondary | ICD-10-CM

## 2014-05-29 DIAGNOSIS — Z853 Personal history of malignant neoplasm of breast: Secondary | ICD-10-CM

## 2014-06-20 ENCOUNTER — Encounter: Payer: Self-pay | Admitting: *Deleted

## 2014-06-20 ENCOUNTER — Encounter: Payer: Self-pay | Admitting: Cardiology

## 2014-06-20 ENCOUNTER — Ambulatory Visit (INDEPENDENT_AMBULATORY_CARE_PROVIDER_SITE_OTHER): Payer: 59 | Admitting: Cardiology

## 2014-06-20 ENCOUNTER — Other Ambulatory Visit: Payer: Self-pay | Admitting: Family Medicine

## 2014-06-20 VITALS — BP 126/90 | HR 77 | Ht 67.5 in | Wt 183.0 lb

## 2014-06-20 DIAGNOSIS — I10 Essential (primary) hypertension: Secondary | ICD-10-CM

## 2014-06-20 DIAGNOSIS — I658 Occlusion and stenosis of other precerebral arteries: Secondary | ICD-10-CM

## 2014-06-20 DIAGNOSIS — I6523 Occlusion and stenosis of bilateral carotid arteries: Secondary | ICD-10-CM

## 2014-06-20 DIAGNOSIS — E785 Hyperlipidemia, unspecified: Secondary | ICD-10-CM

## 2014-06-20 DIAGNOSIS — I6529 Occlusion and stenosis of unspecified carotid artery: Secondary | ICD-10-CM

## 2014-06-20 MED ORDER — ZOLPIDEM TARTRATE 10 MG PO TABS: 10.0000 mg | ORAL_TABLET | Freq: Every day | ORAL | Status: DC | PRN

## 2014-06-20 MED ORDER — LABETALOL HCL 300 MG PO TABS: 300.0000 mg | ORAL_TABLET | Freq: Two times a day (BID) | ORAL | Status: DC

## 2014-06-20 MED ORDER — AMLODIPINE BESYLATE 10 MG PO TABS: 10.0000 mg | ORAL_TABLET | Freq: Every day | ORAL | Status: DC

## 2014-06-20 MED ORDER — ATORVASTATIN CALCIUM 20 MG PO TABS
20.0000 mg | ORAL_TABLET | Freq: Every day | ORAL | Status: DC
Start: 2014-06-20 — End: 2014-06-24

## 2014-06-20 MED ORDER — SPIRONOLACTONE 25 MG PO TABS: ORAL_TABLET | ORAL | Status: DC

## 2014-06-20 NOTE — Patient Instructions (Signed)
Your physician recommends that you have  lab work today--BMET/Lipid profile.  Your physician recommends that you return for lab work every 4 months--BMET.  Your physician has requested that you have a carotid duplex. This test is an ultrasound of the carotid arteries in your neck. It looks at blood flow through these arteries that supply the brain with blood. Allow one hour for this exam. There are no restrictions or special instructions.  Your physician wants you to follow-up in: 1 year with Dr Aundra Dubin. (September 2016). You will receive a reminder letter in the mail two months in advance. If you don't receive a letter, please call our office to schedule the follow-up appointment.

## 2014-06-21 LAB — BASIC METABOLIC PANEL
BUN: 21 mg/dL (ref 6–23)
CHLORIDE: 104 meq/L (ref 96–112)
CO2: 30 mEq/L (ref 19–32)
CREATININE: 0.8 mg/dL (ref 0.4–1.2)
Calcium: 8.6 mg/dL (ref 8.4–10.5)
GFR: 93.72 mL/min (ref >60.00)
GLUCOSE: 94 mg/dL (ref 70–99)
POTASSIUM: 3.5 meq/L (ref 3.5–5.1)
Sodium: 139 mEq/L (ref 135–145)

## 2014-06-21 LAB — LIPID PANEL
CHOLESTEROL: 164 mg/dL (ref 0–200)
HDL: 51.2 mg/dL (ref >39.00)
LDL Cholesterol: 98 mg/dL (ref 0–99)
NonHDL: 112.8
TRIGLYCERIDES: 76 mg/dL (ref 0.0–149.0)
Total CHOL/HDL Ratio: 3
VLDL: 15.2 mg/dL (ref 0.0–40.0)

## 2014-06-23 DIAGNOSIS — I6529 Occlusion and stenosis of unspecified carotid artery: Secondary | ICD-10-CM | POA: Insufficient documentation

## 2014-06-23 HISTORY — DX: Occlusion and stenosis of unspecified carotid artery: I65.29

## 2014-06-23 NOTE — Progress Notes (Signed)
Patient ID: Laura Poole, female   DOB: [birth year], 63 y.o.   MRN: [id number] PCP: Dr. Linna Darner  63 yo with history of breast cancer, carotid stenosis, and atypical chest pain as well as exertional dyspnea returns for cardiology evaluation.  Patient has had a history of nonexertional chest pain.  Last myoview was in 4/11 and showed no evidence for ischemia.  Echo in 8/12 showed EF 65-70% with moderate mitral regurgitation and mild diastolic dysfunction.  BNP was not significantly elevated.  LHC in 4/13 showed no coronary disease.    Lately, she has been doing well.  No exertional chest pain or dyspnea recently.  No stroke-like symptoms.  No syncope/lightheadedness.  Her BP is upper normal today but she has not taken her BP meds.   Labs (1/12): creatinine 0.93 Labs (8/12): K 3.7, creatinine 0.7, BNP 65, LDL 63, HDL 63 Labs (1/13): K 4, creatinine 0.9 Labs (4/13): K 3.9, creatinine 0.9 Labs (7/14): LDL 116, HDL 64.5  ECG: NSR, normal  PMH: 1. Breast cancer (2006): Treated with chemo and radiation. 2. GERD 3. Colon polyps 4. HTN 5. Hyperlipidemia 6. Atypical chest pain: Myoview (4/11) with EF 70%, breast attenuation, no ischemia.  Coronary CTA (4/13): Difficult study with attenuation obscuring portions of the RCA and LAD.  LHC (4/13): No angiographic CAD, EF 60%.   7. Exertional dyspnea: Echo (2008) with EF 60%, moderate LV hypertrophy, MVP, mild MR.  Echo (8/12): EF 60-65%, mild LV hypertrophy, grade I diastolic dysfunction, moderate MR.  8. Carotid stenosis: 2/13 carotids with 73-22% RICA, 02-54% LICA.   9. ABIs 2/13 were normal.   SH: Takes care of mother and 3 grandchildren.  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  Nonsmoker.   FH: Father with MI at 75, Brother with MI at 2.  ROS: All systems reviewed and negative except as per HPI.    Current Outpatient Prescriptions  Medication Sig Dispense Refill  . amLODipine (NORVASC) 10 MG tablet Take 1 tablet (10 mg total) by mouth daily.  90 tablet  3   . aspirin 81 MG tablet Take 1 tablet (81 mg total) by mouth daily.  30 tablet  6  . atorvastatin (LIPITOR) 20 MG tablet Take 1 tablet (20 mg total) by mouth daily.  90 tablet  3  . cetirizine (ZYRTEC) 10 MG tablet Take 10 mg by mouth daily.      . Cholecalciferol (VITAMIN D3) 1000 UNITS CAPS Take 1 capsule by mouth daily.       [name] labetalol (NORMODYNE) 300 MG tablet Take 1 tablet (300 mg total) by mouth 2 (two) times daily.  180 tablet  3  . pantoprazole (PROTONIX) 40 MG tablet Take 1 tablet (40 mg total) by mouth daily.  90 tablet  3  . spironolactone (ALDACTONE) 25 MG tablet TAKE 1 TABLET BY MOUTH ONCE DAILY IN PLACE OF GENERIC DYAZIDE AND POTASSIUM SUPPLEMENT  90 tablet  3  . zolpidem (AMBIEN) 10 MG tablet Take 1 tablet (10 mg total) by mouth daily as needed.  30 tablet  0  . cyclobenzaprine (FLEXERIL) 10 MG tablet TAKE 1 TABLET BY MOUTH 3 TIMES DAILY AS NEEDED FOR MUSCLE SPASMS.  30 tablet  0  . meloxicam (MOBIC) 15 MG tablet TAKE 1 TABLET BY MOUTH ONCE DAILY  30 tablet  0   No current facility-administered medications for this visit.    BP 126/90  Pulse 77  Ht 5' 7.5" (1.715 m)  Wt 183 lb (83.008 kg)  BMI 28.22 kg/m2 General:  NAD Neck: No JVD, no thyromegaly or thyroid nodule.  Lungs: Clear to auscultation bilaterally with normal respiratory effort. CV: Nondisplaced PMI.  Heart regular S1/S2, no S3/S4, no murmur.  No peripheral edema.  Soft bilateral carotid bruits.  Difficult to palpate pedal pulses.  Abdomen: Soft, nontender, no hepatosplenomegaly, no distention.  Neurologic: Alert and oriented x 3.  Psych: Normal affect. Extremities: No clubbing or cyanosis.   Assessment/Plan: 1. Chest pain: No recent chest pain, LHC without significant coronary disease in 2013.  2. Carotid stenosis: Moderate in 2013.  Will arrange for repeat carotid dopplers.  3. Hyperlipidemia: Goal LDL < 70.  Will continue statin and check lipids today.  4. HTN: BP upper normal today but she has not taken  her meds yet today. Check BMET today and will need BMET at least every 3-4 months given spironolactone use.   Loralie Champagne 06/23/2014

## 2014-06-24 ENCOUNTER — Other Ambulatory Visit: Payer: Self-pay | Admitting: *Deleted

## 2014-06-24 DIAGNOSIS — E785 Hyperlipidemia, unspecified: Secondary | ICD-10-CM

## 2014-06-24 DIAGNOSIS — I6529 Occlusion and stenosis of unspecified carotid artery: Secondary | ICD-10-CM

## 2014-06-24 MED ORDER — ATORVASTATIN CALCIUM 40 MG PO TABS: 40.0000 mg | ORAL_TABLET | Freq: Every day | ORAL | Status: DC

## 2014-06-28 ENCOUNTER — Encounter (HOSPITAL_COMMUNITY): Payer: 59

## 2014-06-28 ENCOUNTER — Ambulatory Visit (HOSPITAL_COMMUNITY): Payer: 59 | Attending: Cardiovascular Disease | Admitting: Cardiology

## 2014-06-28 DIAGNOSIS — I10 Essential (primary) hypertension: Secondary | ICD-10-CM | POA: Diagnosis not present

## 2014-06-28 DIAGNOSIS — E785 Hyperlipidemia, unspecified: Secondary | ICD-10-CM | POA: Diagnosis not present

## 2014-06-28 DIAGNOSIS — I6523 Occlusion and stenosis of bilateral carotid arteries: Secondary | ICD-10-CM

## 2014-06-28 DIAGNOSIS — I6529 Occlusion and stenosis of unspecified carotid artery: Secondary | ICD-10-CM | POA: Diagnosis not present

## 2014-06-28 NOTE — Progress Notes (Signed)
Carotid duplex performed 

## 2014-08-15 ENCOUNTER — Telehealth: Payer: Self-pay | Admitting: Gastroenterology

## 2014-08-15 MED ORDER — PANTOPRAZOLE SODIUM 40 MG PO TBEC: 40.0000 mg | DELAYED_RELEASE_TABLET | Freq: Two times a day (BID) | ORAL | Status: DC

## 2014-08-15 NOTE — Telephone Encounter (Signed)
Needs ppi script.  No alarm symptoms.

## 2014-08-20 ENCOUNTER — Telehealth: Payer: Self-pay | Admitting: *Deleted

## 2014-08-20 ENCOUNTER — Other Ambulatory Visit: Payer: Self-pay | Admitting: *Deleted

## 2014-08-20 ENCOUNTER — Other Ambulatory Visit: Payer: 59

## 2014-08-20 MED ORDER — CYCLOBENZAPRINE HCL 10 MG PO TABS: ORAL_TABLET | ORAL | Status: DC

## 2014-08-20 NOTE — Telephone Encounter (Signed)
Refill done.  

## 2014-08-27 ENCOUNTER — Other Ambulatory Visit: Payer: Self-pay | Admitting: *Deleted

## 2014-08-27 MED ORDER — MELOXICAM 15 MG PO TABS: 15.0000 mg | ORAL_TABLET | Freq: Every day | ORAL | Status: DC

## 2014-10-30 ENCOUNTER — Encounter: Payer: Self-pay | Admitting: Family Medicine

## 2014-10-30 ENCOUNTER — Other Ambulatory Visit: Payer: Self-pay | Admitting: Family Medicine

## 2014-10-30 ENCOUNTER — Ambulatory Visit (INDEPENDENT_AMBULATORY_CARE_PROVIDER_SITE_OTHER)
Admission: RE | Admit: 2014-10-30 | Discharge: 2014-10-30 | Disposition: A | Discharge status: Home / Self Care | Payer: 59 | Source: Ambulatory Visit | Attending: Family Medicine | Admitting: Family Medicine

## 2014-10-30 ENCOUNTER — Ambulatory Visit (INDEPENDENT_AMBULATORY_CARE_PROVIDER_SITE_OTHER): Payer: 59 | Admitting: Family Medicine

## 2014-10-30 VITALS — BP 138/82 | HR 88 | Ht 67.5 in | Wt 180.0 lb

## 2014-10-30 DIAGNOSIS — M999 Biomechanical lesion, unspecified: Secondary | ICD-10-CM

## 2014-10-30 DIAGNOSIS — M549 Dorsalgia, unspecified: Secondary | ICD-10-CM

## 2014-10-30 DIAGNOSIS — M222X9 Patellofemoral disorders, unspecified knee: Secondary | ICD-10-CM | POA: Insufficient documentation

## 2014-10-30 DIAGNOSIS — M4000 Postural kyphosis, site unspecified: Secondary | ICD-10-CM

## 2014-10-30 DIAGNOSIS — M9903 Segmental and somatic dysfunction of lumbar region: Secondary | ICD-10-CM

## 2014-10-30 DIAGNOSIS — M9902 Segmental and somatic dysfunction of thoracic region: Secondary | ICD-10-CM

## 2014-10-30 MED ORDER — DICLOFENAC SODIUM 2 % TD SOLN: TRANSDERMAL | Status: DC

## 2014-10-30 MED ORDER — CYCLOBENZAPRINE HCL 10 MG PO TABS: ORAL_TABLET | ORAL | Status: DC

## 2014-10-30 NOTE — Progress Notes (Signed)
[name] [address], [place] [address] Phone: [phone] Subjective:     CC: Back pain, bilateral knee pain  [id number] Laura Poole is a 64 y.o. female coming in with complaint of back pain. Patient does have a past medical history significant for breast cancer in 2006 status post lumpectomy. Patient was last seen 8 months ago for her back pain. Patient has had a chest x-ray in February 2015 showing a mild increasing kyphosis as well as degenerative changes of the thoracic spine. Patient states her back was doing fairly well but patient states that Flexeril as relieving this seems to be helpful. Patient continues to work on a regular basis. Patient does not do any other exercises.    Bilateral knee pain- patient states that she has had this for multiple months. Seems to be getting worse. Worse with going upstairs. Denies any radiation. Seems to be more on the anterior aspect of the knee. Patient does not remember any injury. Patient rates the severity of pain as 5 out of 10.      Past medical history, social, surgical and family history all reviewed in electronic medical record. Patient past medical history significant for breast cancer.  Review of Systems: No headache, visual changes, nausea, vomiting, diarrhea, constipation, dizziness, abdominal pain, skin rash, fevers, chills, night sweats, weight loss, swollen lymph nodes, body aches, joint swelling, muscle aches, chest pain, shortness of breath, mood changes.   Objective Blood pressure 138/82, pulse 88, height 5' 7.5" (1.715 m), weight 180 lb (81.647 kg), SpO2 96 %.  General: No apparent distress alert and oriented x3 mood and affect normal, dressed appropriately.  HEENT: Pupils equal, extraocular movements intact  Respiratory: Patient's speak in full sentences and does not appear short of breath  Cardiovascular: No lower extremity edema, non tender, no erythema  Skin: Warm dry intact  with no signs of infection or rash on extremities or on axial skeleton.  Abdomen: Soft nontender  Neuro: Cranial nerves II through XII are intact, neurovascularly intact in all extremities with 2+ DTRs and 2+ pulses.  Lymph: No lymphadenopathy of posterior or anterior cervical chain or axillae bilaterally.  Gait normal with good balance and coordination.  MSK:  Non tender with full range of motion and good stability and symmetric strength and tone of shoulders, elbows, wrist, hip, and ankles bilaterally.  Knee: Lateral Normal to inspection with no erythema or effusion or obvious bony abnormalities. Palpation normal with no warmth, joint line tenderness, patellar tenderness, or condyle tenderness. ROM full in flexion and extension and lower leg rotation. Ligaments with solid consistent endpoints including ACL, PCL, LCL, MCL. Negative Mcmurray's, Apley's, and Thessalonian tests.  painful patellar compression. Patellar glide with moderate crepitus. Patellar and quadriceps tendons unremarkable. Hamstring and quadriceps strength is normal.    Back Exam:  Inspection: Excessive kyphosis of the thoracic spine  Motion: Flexion 35 deg, Extension 25 deg, Side Bending to 25 deg bilaterally,  Rotation to 35 deg bilaterally  SLR laying: Negative  XSLR laying: Negative  Palpable tenderness: Patient does have multiple points of tenderness bilaterally down thoracic and lumbar spine. Patient does have some mild increase in hypertonicity of the muscles but no specific trigger points noted.[name] FABER: negative. Sensory change: Gross sensation intact to all lumbar and sacral dermatomes.  Reflexes: 2+ at both patellar tendons, 2+ at achilles tendons, Babinski's downgoing.  Strength at foot  Plantar-flexion: 5/5 Dorsi-flexion: 5/5 Eversion: 5/5 Inversion: 5/5  Leg strength  Quad: 5/5  Hamstring: 5/5 Hip flexor: 5/5 Hip abductors: 5/5  Gait unremarkable.   OMT Physical Exam  Standing structural         Occiput right higher  Shoulder left higher    Standing flexion  left Seated Flexion Left   Cervical  Neutral Thoracic Patient does have increasing kyphosis with a group curve from T4-T8 rotated right side bent left Lumbar L2 flexed rotated and side bent right   Procedure note 97110; 15 minutes spent for Therapeutic exercises as stated in above notes.  This included exercises focusing on stretching, strengthening, with significant focus on eccentric aspects.   Proper technique shown and discussed handout in great detail with ATC.  All questions were discussed and answered.      Impression and Recommendations:     This case required medical decision making of moderate complexity.

## 2014-10-30 NOTE — Assessment & Plan Note (Signed)
Decision today to treat with OMT was based on Physical Exam  After verbal consent patient was treated with HVLA, ME techniques in thoracic, lumbar, sacrum areas  Patient tolerated the procedure well with improvement in symptoms  Patient given exercises, stretches and lifestyle modifications  See medications in patient instructions if given  Patient will follow up in 3-4 weeks

## 2014-10-30 NOTE — Assessment & Plan Note (Signed)
Does have history of DDD.  HEP, refilled flexeril. Try topical pennsaid.  Icing protocol.  Patient did respond well to osteopathic manipulation. Due to patient's history of breast cancer do want to get x-rays to make sure there is no bone lesion. I think this is highly unlikely. Patient continue with the over-the-counter natural supple mentation swell.

## 2014-10-30 NOTE — Patient Instructions (Addendum)
It is good to see you.  New exercises and posture exercises we gave you.  ON wall heels, butt shoulder and head touching for goal of 5 minutes daily Try the flexeril and pennsaid for pain Get moving For the knee new exercises Get xrays downstairs See me again in 3-4 week if knee still hurting.

## 2014-10-30 NOTE — Assessment & Plan Note (Signed)
Patellofemoral Syndrome  Reviewed anatomy using anatomical model and how PFS occurs.  Given rehab exercises handout for VMO, hip abductors, core, entire kinetic chain including proprioception exercises including cone touches, step downs, hip elevations and turn outs.  Could benefit from PT, regular exercise, upright biking, and a PFS knee brace to assist with tracking abnormalities.  Brace given  RTC in 3 weeks.

## 2014-12-11 ENCOUNTER — Other Ambulatory Visit: Payer: Self-pay | Admitting: Cardiology

## 2014-12-12 ENCOUNTER — Telehealth: Payer: Self-pay | Admitting: Cardiology

## 2014-12-12 NOTE — Telephone Encounter (Signed)
I had wrong fax number, correct fax number [phone]--Jessica, HIM will fax tomorrow. Pt advised.

## 2014-12-12 NOTE — Telephone Encounter (Signed)
New Message        Pt calling to follow up with Laura Poole about orders for lab work being faxed for her to have labs done at [facility]. Please call back and advise.

## 2014-12-12 NOTE — Telephone Encounter (Signed)
New Msg       Pt calling, would like to have labs completed at [facility] since she works there.  Is this an option? Please call pt at work [phone] until 3 pm today.

## 2014-12-12 NOTE — Telephone Encounter (Signed)
Pt requesting lab order for fasting lipid/liver profile fax to [phone]. This has been done.

## 2014-12-12 NOTE — Telephone Encounter (Signed)
In a case, not available.

## 2014-12-13 ENCOUNTER — Other Ambulatory Visit (INDEPENDENT_AMBULATORY_CARE_PROVIDER_SITE_OTHER): Payer: 59 | Admitting: *Deleted

## 2014-12-13 ENCOUNTER — Telehealth: Payer: Self-pay | Admitting: Cardiology

## 2014-12-13 ENCOUNTER — Encounter: Payer: Self-pay | Admitting: Physician Assistant

## 2014-12-13 DIAGNOSIS — E785 Hyperlipidemia, unspecified: Secondary | ICD-10-CM

## 2014-12-13 DIAGNOSIS — I6523 Occlusion and stenosis of bilateral carotid arteries: Secondary | ICD-10-CM

## 2014-12-13 DIAGNOSIS — I6529 Occlusion and stenosis of unspecified carotid artery: Secondary | ICD-10-CM

## 2014-12-13 LAB — BASIC METABOLIC PANEL
BUN: 15 mg/dL (ref 6–23)
CHLORIDE: 105 meq/L (ref 96–112)
CO2: 31 mEq/L (ref 19–32)
Calcium: 9.3 mg/dL (ref 8.4–10.5)
Creatinine, Ser: 0.92 mg/dL (ref 0.40–1.20)
GFR: 79.63 mL/min (ref >60.00)
Glucose, Bld: 103 mg/dL — ABNORMAL HIGH (ref 70–99)
POTASSIUM: 4 meq/L (ref 3.5–5.1)
SODIUM: 141 meq/L (ref 135–145)

## 2014-12-13 LAB — LIPID PANEL
CHOLESTEROL: 133 mg/dL (ref 0–200)
HDL: 57.5 mg/dL (ref >39.00)
LDL CALC: 67 mg/dL (ref 0–99)
NonHDL: 75.5
TRIGLYCERIDES: 43 mg/dL (ref 0.0–149.0)
Total CHOL/HDL Ratio: 2
VLDL: 8.6 mg/dL (ref 0.0–40.0)

## 2014-12-13 LAB — HEPATIC FUNCTION PANEL
ALT: 14 U/L (ref 0–35)
AST: 15 U/L (ref 0–37)
Albumin: 4.1 g/dL (ref 3.5–5.2)
Alkaline Phosphatase: 61 U/L (ref 39–117)
BILIRUBIN DIRECT: 0.1 mg/dL (ref 0.0–0.3)
BILIRUBIN TOTAL: 0.4 mg/dL (ref 0.2–1.2)
Total Protein: 7.2 g/dL (ref 6.0–8.3)

## 2014-12-13 NOTE — Telephone Encounter (Signed)
New message ° ° ° ° °Want lab results °

## 2014-12-13 NOTE — Telephone Encounter (Signed)
Informed the pt that per Dr Aundra Dubin her lipids were good.  Pt verbalized understanding.

## 2014-12-20 ENCOUNTER — Other Ambulatory Visit: Payer: 59

## 2014-12-20 ENCOUNTER — Ambulatory Visit: Payer: 59 | Admitting: Physician Assistant

## 2014-12-25 ENCOUNTER — Telehealth: Payer: Self-pay | Admitting: Cardiology

## 2014-12-25 NOTE — Telephone Encounter (Signed)
Called patient about her swelling in left ankle. Patient complaining of slight swelling in left ankle. Patient does not have any pain, pitting or redness in left ankle. Informed patient that amlodipine can cause swelling in the extremities. Advised patient to elevate her left ankle and give [place] a call if swelling gets worse. Patient agreed to plan.

## 2014-12-25 NOTE — Telephone Encounter (Signed)
Pt c/o swelling: STAT is pt has developed SOB within 24 hours  1. How long have you been experiencing swelling? Noticed it a week ago.. And today a nurse noticed it and advised her to call her doctor   2. Where is the swelling located? Left Ankle   3.  Are you currently taking a "fluid pill"? No   4.  Are you currently SOB? When she walks a long distance..   5.  Have you traveled recently?No

## 2015-01-02 ENCOUNTER — Other Ambulatory Visit: Payer: Self-pay

## 2015-01-02 ENCOUNTER — Telehealth: Payer: Self-pay

## 2015-01-02 MED ORDER — ZOLPIDEM TARTRATE 10 MG PO TABS: 10.0000 mg | ORAL_TABLET | Freq: Every evening | ORAL | Status: DC | PRN

## 2015-01-02 NOTE — Telephone Encounter (Signed)
Yes may refill. 

## 2015-02-26 ENCOUNTER — Other Ambulatory Visit: Payer: Self-pay | Admitting: Cardiology

## 2015-03-04 ENCOUNTER — Other Ambulatory Visit: Payer: Self-pay

## 2015-03-04 MED ORDER — ZOLPIDEM TARTRATE 10 MG PO TABS: 10.0000 mg | ORAL_TABLET | Freq: Every evening | ORAL | Status: DC | PRN

## 2015-04-18 ENCOUNTER — Other Ambulatory Visit: Payer: Self-pay

## 2015-04-18 DIAGNOSIS — Z1231 Encounter for screening mammogram for malignant neoplasm of breast: Secondary | ICD-10-CM

## 2015-05-15 ENCOUNTER — Ambulatory Visit (INDEPENDENT_AMBULATORY_CARE_PROVIDER_SITE_OTHER): Payer: 59 | Admitting: Family Medicine

## 2015-05-15 ENCOUNTER — Ambulatory Visit (INDEPENDENT_AMBULATORY_CARE_PROVIDER_SITE_OTHER)
Admission: RE | Admit: 2015-05-15 | Discharge: 2015-05-15 | Disposition: A | Discharge status: Home / Self Care | Payer: 59 | Source: Ambulatory Visit | Attending: Family Medicine | Admitting: Family Medicine

## 2015-05-15 ENCOUNTER — Other Ambulatory Visit (INDEPENDENT_AMBULATORY_CARE_PROVIDER_SITE_OTHER): Payer: 59

## 2015-05-15 ENCOUNTER — Encounter: Payer: Self-pay | Admitting: Family Medicine

## 2015-05-15 VITALS — BP 122/84 | HR 87 | Ht 67.5 in | Wt 185.0 lb

## 2015-05-15 DIAGNOSIS — M222X1 Patellofemoral disorders, right knee: Secondary | ICD-10-CM | POA: Diagnosis not present

## 2015-05-15 DIAGNOSIS — M25561 Pain in right knee: Secondary | ICD-10-CM | POA: Diagnosis not present

## 2015-05-15 DIAGNOSIS — M222X9 Patellofemoral disorders, unspecified knee: Secondary | ICD-10-CM | POA: Diagnosis not present

## 2015-05-15 MED ORDER — TIZANIDINE HCL 4 MG PO TABS: 4.0000 mg | ORAL_TABLET | Freq: Three times a day (TID) | ORAL | Status: DC

## 2015-05-15 NOTE — Progress Notes (Signed)
Pre visit review using our clinic review tool, if applicable. No additional management support is needed unless otherwise documented below in the visit note. 

## 2015-05-15 NOTE — Progress Notes (Signed)
Laura Poole Laura Poole, [place] [address] Phone: [phone] Subjective:     CC: Right knee pain follow-up  GYF:[id number] Laura Poole is a 64 y.o. female coming in with complaint of right knee pain. Patient sits that her back seems to be doing relatively well. Patient states though that she is having more pain in her knee. States that it's on the right side and on the lateral aspect. States that she can no longer lead with this knee when going upstairs. Patient has been trying a topical anti-inflammatory in the muscle relaxer with minimal benefit. Patient has had sharp pains that make her stop activity.  No swelling or numbness. Has not given out on her.       Past medical history, social, surgical and family history all reviewed in electronic medical record. Patient past medical history significant for breast cancer.  Review of Systems: No headache, visual changes, nausea, vomiting, diarrhea, constipation, dizziness, abdominal pain, skin rash, fevers, chills, night sweats, weight loss, swollen lymph nodes, body aches, joint swelling, muscle aches, chest pain, shortness of breath, mood changes.   Objective Blood pressure 122/84, pulse 87, height 5' 7.5" (1.715 m), weight 185 lb (83.915 kg), SpO2 97 %.  General: No apparent distress alert and oriented x3 mood and affect normal, dressed appropriately.  HEENT: Pupils equal, extraocular movements intact  Respiratory: Patient's speak in full sentences and does not appear short of breath  Cardiovascular: No lower extremity edema, non tender, no erythema  Skin: Warm dry intact with no signs of infection or rash on extremities or on axial skeleton.  Abdomen: Soft nontender  Neuro: Cranial nerves II through XII are intact, neurovascularly intact in all extremities with 2+ DTRs and 2+ pulses.  Lymph: No lymphadenopathy of posterior or anterior cervical chain or axillae bilaterally.  Gait normal with  good balance and coordination.  MSK:  Non tender with full range of motion and good stability and symmetric strength and tone of shoulders, elbows, wrist, hip, and ankles bilaterally.  Knee: Right knee Normal to inspection with no erythema or effusion or obvious bony abnormalities. More tender over the lateral joint line ROM full in flexion and extension and lower leg rotation. Ligaments with solid consistent endpoints including ACL, PCL, LCL, MCL. Negative Mcmurray's, Apley's, and Thessalonian tests.  painful patellar compression. Patellar glide with moderate crepitus. Patellar and quadriceps tendons unremarkable. Hamstring and quadriceps strength is normal.   Procedure: Real-time Ultrasound Guided Injection of right knee Device: GE Logiq E  Ultrasound guided injection is preferred based studies that show increased duration, increased effect, greater accuracy, decreased procedural pain, increased response rate, and decreased cost with ultrasound guided versus blind injection.  Verbal informed consent obtained.  Time-out conducted.  Noted no overlying erythema, induration, or other signs of local infection.  Skin prepped in a sterile fashion.  Local anesthesia: Topical Ethyl chloride.  With sterile technique and under real time ultrasound guidance: With a 22-gauge 2 inch needle patient was injected with 4 cc of 0.5% Marcaine and 1 cc of Kenalog 40 mg/dL. This was from a superior lateral approach.  Completed without difficulty  Pain immediately resolved suggesting accurate placement of the medication.  Advised to call if fevers/chills, erythema, induration, drainage, or persistent bleeding.  Images permanently stored and available for review in the ultrasound unit.  Impression: Technically successful ultrasound guided injection.       Impression and Recommendations:     This case required medical decision making  of moderate complexity.

## 2015-05-15 NOTE — Patient Instructions (Signed)
Good to see you Continue icing your knee Physical therapy will be calling you Continue the brace with aq lot of activity Iron 65mg  daily or every other day for constipation.  Xray downstairs today.  New muscle relaxer See me again in 4 weeks.

## 2015-05-15 NOTE — Assessment & Plan Note (Signed)
Patient was given an injection today with complete resolution of pain. Patient will be sent to formal physical therapy which I think would be beneficial. X-rays ordered today to make sure there is no other bony abnormality occurring. Patient already has a brace. Patient given a new muscle relaxer because he underwent does not seem to be beneficial. Patient come back in 4 weeks for further evaluation. If continuing have pain we may need to consider further workup with advanced imaging. Patient does have weakness of the right side of her body to a certain degree and we'll need to continue to monitor.

## 2015-05-16 ENCOUNTER — Encounter: Payer: Self-pay | Admitting: Internal Medicine

## 2015-05-16 ENCOUNTER — Other Ambulatory Visit (INDEPENDENT_AMBULATORY_CARE_PROVIDER_SITE_OTHER): Payer: 59

## 2015-05-16 ENCOUNTER — Ambulatory Visit (INDEPENDENT_AMBULATORY_CARE_PROVIDER_SITE_OTHER): Payer: 59 | Admitting: Internal Medicine

## 2015-05-16 VITALS — BP 136/86 | HR 102 | Temp 98.6°F | Resp 16 | Ht 67.5 in | Wt 188.0 lb

## 2015-05-16 DIAGNOSIS — Z0000 Encounter for general adult medical examination without abnormal findings: Secondary | ICD-10-CM

## 2015-05-16 DIAGNOSIS — F5089 Other specified eating disorder: Secondary | ICD-10-CM

## 2015-05-16 DIAGNOSIS — Z0189 Encounter for other specified special examinations: Secondary | ICD-10-CM | POA: Diagnosis not present

## 2015-05-16 DIAGNOSIS — R3589 Other polyuria: Secondary | ICD-10-CM

## 2015-05-16 DIAGNOSIS — R358 Other polyuria: Secondary | ICD-10-CM

## 2015-05-16 DIAGNOSIS — R351 Nocturia: Secondary | ICD-10-CM

## 2015-05-16 DIAGNOSIS — R5383 Other fatigue: Secondary | ICD-10-CM

## 2015-05-16 LAB — CBC WITH DIFFERENTIAL/PLATELET
Basophils Absolute: 0 10*3/uL (ref 0.0–0.1)
Basophils Relative: 0.1 % (ref 0.0–3.0)
Eosinophils Absolute: 0 10*3/uL (ref 0.0–0.7)
Eosinophils Relative: 0 % (ref 0.0–5.0)
HEMATOCRIT: 37.6 % (ref 36.0–46.0)
HEMOGLOBIN: 12.3 g/dL (ref 12.0–15.0)
LYMPHS ABS: 1 10*3/uL (ref 0.7–4.0)
Lymphocytes Relative: 4.9 % — ABNORMAL LOW (ref 12.0–46.0)
MCHC: 32.8 g/dL (ref 30.0–36.0)
MCV: 88.4 fl (ref 78.0–100.0)
Monocytes Absolute: 0.7 10*3/uL (ref 0.1–1.0)
Monocytes Relative: 3.6 % (ref 3.0–12.0)
Neutro Abs: 18.8 10*3/uL — ABNORMAL HIGH (ref 1.4–7.7)
Neutrophils Relative %: 91.4 % — ABNORMAL HIGH (ref 43.0–77.0)
Platelets: 299 10*3/uL (ref 150.0–400.0)
RBC: 4.26 Mil/uL (ref 3.87–5.11)
RDW: 15.5 % (ref 11.5–15.5)
WBC: 20.6 10*3/uL — AB (ref 4.0–10.5)

## 2015-05-16 LAB — URINALYSIS
BILIRUBIN URINE: NEGATIVE
Ketones, ur: NEGATIVE
LEUKOCYTES UA: NEGATIVE
NITRITE: NEGATIVE
Specific Gravity, Urine: 1.02 (ref 1.000–1.030)
Total Protein, Urine: NEGATIVE
Urine Glucose: NEGATIVE
Urobilinogen, UA: 0.2 (ref 0.0–1.0)
pH: 6 (ref 5.0–8.0)

## 2015-05-16 LAB — BASIC METABOLIC PANEL
BUN: 25 mg/dL — ABNORMAL HIGH (ref 6–23)
CHLORIDE: 102 meq/L (ref 96–112)
CO2: 26 mEq/L (ref 19–32)
Calcium: 9.5 mg/dL (ref 8.4–10.5)
Creatinine, Ser: 0.98 mg/dL (ref 0.40–1.20)
GFR: 73.93 mL/min (ref >60.00)
GLUCOSE: 103 mg/dL — AB (ref 70–99)
POTASSIUM: 3.8 meq/L (ref 3.5–5.1)
SODIUM: 137 meq/L (ref 135–145)

## 2015-05-16 LAB — HEPATIC FUNCTION PANEL
ALBUMIN: 4.4 g/dL (ref 3.5–5.2)
ALT: 16 U/L (ref 0–35)
AST: 16 U/L (ref 0–37)
Alkaline Phosphatase: 68 U/L (ref 39–117)
BILIRUBIN DIRECT: 0 mg/dL (ref 0.0–0.3)
TOTAL PROTEIN: 7.7 g/dL (ref 6.0–8.3)
Total Bilirubin: 0.3 mg/dL (ref 0.2–1.2)

## 2015-05-16 LAB — IBC PANEL
Iron: 55 ug/dL (ref 42–145)
Saturation Ratios: 13.6 % — ABNORMAL LOW (ref 20.0–50.0)
Transferrin: 288 mg/dL (ref 212.0–360.0)

## 2015-05-16 LAB — HEMOGLOBIN A1C: Hgb A1c MFr Bld: 6.3 % (ref 4.6–6.5)

## 2015-05-16 LAB — FERRITIN: FERRITIN: 24.7 ng/mL (ref 10.0–291.0)

## 2015-05-16 LAB — TSH: TSH: 0.49 u[IU]/mL (ref 0.35–4.50)

## 2015-05-16 NOTE — Progress Notes (Signed)
   Subjective:    Patient ID: Laura Poole, female    DOB: [birth year], 64 y.o.   MRN: [id number]  HPI She is here for a physical;acute issues include feeling tired for several months. She's also been eating ice.  She does have some exertional dyspnea. She has no definite bleeding dyscrasias. She has been declined as a donor at the [employer]. She is post TAH.  She describes a knot in her throat but not frank dysphagia.  Colonoscopy was last done 2009 by Dr.Magod; she is now seeing Dr. Ardis Hughs.   She is being seen in Sports Medicine for several muscle spasms. Medications include prophylactic aspirin as well as meloxicam 15 mg as needed.    Review of Systems  Epistaxis, hemoptysis, hematuria, melena, or rectal bleeding denied. No unexplained weight loss, significant dyspepsia,  or abdominal pain.  There is no abnormal bruising , bleeding, or difficulty stopping bleeding with injury.  She does describe polyuria. She also has nocturia on average 5 times a night.  She also describes heat intolerance.  Chest pain, palpitations, tachycardia, paroxysmal nocturnal dyspnea, claudication or edema are absent. No unexplained weight loss, abdominal pain, significant dyspepsia, dysphagia, melena, rectal bleeding, or persistently small caliber stools. Dysuria, pyuria, hematuria,or  frequency are denied. Change in hair, skin, nails denied. No bowel changes of constipation or diarrhea. No intolerance to cold.      Objective:   Physical Exam   Pertinent or positive findings include:Arcus senilis is present. Pallor of the conjunctiva suggested. She has a grade 6-3.0 systolic murmur. There is minor crepitus of the knees. She has trace ankle edema.   General appearance :adequately nourished; in no distress.  Eyes: No conjunctival inflammation or scleral icterus is present.  Oral exam:  Lips and gums are healthy appearing.There is no oropharyngeal erythema or exudate noted. Dental hygiene is  good.  Heart:  Normal rate and regular rhythm. S1 and S2 normal without gallop, click, rub or other extra sounds    Lungs:Chest clear to auscultation; no wheezes, rhonchi,rales ,or rubs present.No increased work of breathing.   Abdomen: bowel sounds normal, soft and non-tender without masses, organomegaly or hernias noted.  No guarding or rebound.   Vascular : all pulses equal ; no bruits present.  Skin:Warm & dry.  Intact without suspicious lesions or rashes ; no tenting   Lymphatic: No lymphadenopathy is noted about the head, neck, axilla   Neuro: Strength, tone & DTRs normal.       Assessment & Plan:  #1 comprehensive physical exam; no acute findings  #2 fatigue and pagophagia; R/O GI related anemia  #3 polyuria & nocturia  Plan: see Orders  & Recommendations

## 2015-05-16 NOTE — Progress Notes (Signed)
Pre visit review using our clinic review tool, if applicable. No additional management support is needed unless otherwise documented below in the visit note. 

## 2015-05-16 NOTE — Patient Instructions (Signed)
  Your next office appointment will be determined based upon review of your pending labs. Those written interpretation of the lab results and instructions will be transmitted to you by My Chart  Critical results will be called.   Followup as needed for any active or acute issue. Please report any significant change in your symptoms. 

## 2015-05-18 ENCOUNTER — Other Ambulatory Visit: Payer: Self-pay | Admitting: Internal Medicine

## 2015-05-18 ENCOUNTER — Encounter: Payer: Self-pay | Admitting: Internal Medicine

## 2015-05-18 DIAGNOSIS — D72829 Elevated white blood cell count, unspecified: Secondary | ICD-10-CM | POA: Insufficient documentation

## 2015-05-19 ENCOUNTER — Other Ambulatory Visit (INDEPENDENT_AMBULATORY_CARE_PROVIDER_SITE_OTHER): Payer: 59

## 2015-05-19 ENCOUNTER — Telehealth: Payer: Self-pay | Admitting: Emergency Medicine

## 2015-05-19 ENCOUNTER — Encounter: Payer: Self-pay | Admitting: Emergency Medicine

## 2015-05-19 DIAGNOSIS — D72829 Elevated white blood cell count, unspecified: Secondary | ICD-10-CM

## 2015-05-19 LAB — CBC WITH DIFFERENTIAL/PLATELET
BASOS ABS: 0 10*3/uL (ref 0.0–0.1)
Basophils Absolute: 0 10*3/uL (ref 0.0–0.1)
Basophils Relative: 0.4 % (ref 0.0–3.0)
Basophils Relative: 0.5 % (ref 0.0–3.0)
EOS PCT: 1.3 % (ref 0.0–5.0)
EOS PCT: 1.3 % (ref 0.0–5.0)
Eosinophils Absolute: 0.1 10*3/uL (ref 0.0–0.7)
Eosinophils Absolute: 0.1 10*3/uL (ref 0.0–0.7)
HCT: 39.5 % (ref 36.0–46.0)
HEMATOCRIT: 39 % (ref 36.0–46.0)
Hemoglobin: 12.9 g/dL (ref 12.0–15.0)
Hemoglobin: 12.7 g/dL (ref 12.0–15.0)
LYMPHS ABS: 2.2 10*3/uL (ref 0.7–4.0)
Lymphocytes Relative: 24.6 % (ref 12.0–46.0)
Lymphocytes Relative: 23 % (ref 12.0–46.0)
Lymphs Abs: 2.2 10*3/uL (ref 0.7–4.0)
MCHC: 32.7 g/dL (ref 30.0–36.0)
MCHC: 32.6 g/dL (ref 30.0–36.0)
MCV: 88.8 fl (ref 78.0–100.0)
MCV: 88.9 fl (ref 78.0–100.0)
MONOS PCT: 13.2 % — AB (ref 3.0–12.0)
Monocytes Absolute: 1 10*3/uL (ref 0.1–1.0)
Monocytes Absolute: 1.3 10*3/uL — ABNORMAL HIGH (ref 0.1–1.0)
Monocytes Relative: 11.1 % (ref 3.0–12.0)
NEUTROS PCT: 62.6 % (ref 43.0–77.0)
NEUTROS PCT: 62 % (ref 43.0–77.0)
Neutro Abs: 5.7 10*3/uL (ref 1.4–7.7)
Neutro Abs: 6 10*3/uL (ref 1.4–7.7)
Platelets: 315 10*3/uL (ref 150.0–400.0)
Platelets: 308 10*3/uL (ref 150.0–400.0)
RBC: 4.45 Mil/uL (ref 3.87–5.11)
RBC: 4.39 Mil/uL (ref 3.87–5.11)
RDW: 16 % — ABNORMAL HIGH (ref 11.5–15.5)
RDW: 15.6 % — ABNORMAL HIGH (ref 11.5–15.5)
WBC: 9 10*3/uL (ref 4.0–10.5)
WBC: 9.7 10*3/uL (ref 4.0–10.5)

## 2015-05-19 NOTE — Telephone Encounter (Signed)
Patient has called back in requesting results of recent lab work.

## 2015-05-19 NOTE — Telephone Encounter (Signed)
LVM for pt to call back, Labs are also visible to pt on MyChart.

## 2015-05-19 NOTE — Telephone Encounter (Signed)
Depends on WBC and any symptoms of infection

## 2015-05-19 NOTE — Telephone Encounter (Signed)
Spoke with pt to inform her to come back into the Lab to have a CBC re-check. Pt stated the only abnormal symptoms she was having were tiredness, pain in stomach, yesterday was more than normal, and more frequent hot flashes. Orders are in for another CBC

## 2015-05-19 NOTE — Telephone Encounter (Signed)
Pt came into office today stating that she was not supposed to go to work today due to the high white count on Friday, she needs a note from her employer. How long should she be out of work and does she need to be seen again?

## 2015-05-20 NOTE — Telephone Encounter (Signed)
Spoke with pt to inform her of lab results.

## 2015-05-30 ENCOUNTER — Telehealth: Payer: Self-pay | Admitting: Internal Medicine

## 2015-05-30 NOTE — Telephone Encounter (Signed)
Forward to PCP as an [place]

## 2015-05-30 NOTE — Telephone Encounter (Signed)
Patient Name: Laura Poole DOB: 1950-12-16 Initial Comment caller states she was stung/bit on her leg Nurse Assessment Nurse: Marcelline Deist, RN, Lynda Date/Time (Eastern Time): 05/30/2015 11:36:24 AM Confirm and document reason for call. If symptomatic, describe symptoms. ---Caller states she was stung/bit on her upper left leg yesterday, pulled out a white "stinger". Thinks it might have been a bee. The area is slightly swollen & red. Put alcohol on it. It is tender when she touches it. Has the patient traveled out of the country within the last 30 days? ---Not Applicable Does the patient require triage? ---Yes Related visit to physician within the last 2 weeks? ---No Does the PT have any chronic conditions? (i.e. diabetes, asthma, etc.) ---Yes List chronic conditions. ---BP rx, cholesterol rx, takes ASA Guidelines Guideline Title Affirmed Question Affirmed Notes Bee or Yellow Jacket Sting Normal local reaction to bee, wasp, or yellow jacket sting (all triage questions negative) Final Disposition [place], RN, Lynda Disagree/Comply: Comply

## 2015-06-02 ENCOUNTER — Ambulatory Visit: Payer: 59

## 2015-06-03 ENCOUNTER — Telehealth: Payer: Self-pay | Admitting: *Deleted

## 2015-06-03 NOTE — Telephone Encounter (Signed)
[facility] Patient Name: Laura Poole Gender: Female DOB: Aug 20, 1951 Age: 64 Y 14 M 17 D Return Phone Number: [phone] (Primary) Address: City/State/Zip: [place] [facility] - Client Client Site [place] - Day Physician Unice Cobble Contact Type Call Call Type Triage / Clinical Relationship To Patient Self Appointment Disposition EMR Appointment Not Necessary Info pasted into Epic Yes Return Phone Number [phone] (Primary) Chief Complaint Bee, Wasp, or Yellow Jacket Sting (no allergic symptoms) Initial Comment caller states she was stung/bit on her leg PreDisposition Call Doctor Nurse Assessment Nurse: Marcelline Deist, RN, Kermit Balo Date/Time (Eastern Time): 05/30/2015 11:36:24 AM Confirm and document reason for call. If symptomatic, describe symptoms. ---Caller states she was stung/bit on her upper left leg yesterday, pulled out a white 'stinger". Thinks it might have been a bee. The area is slightly swollen & red. Put alcohol on it. It is tender when she touches it. Has the patient traveled out of the country within the last 30 days? ---Not Applicable Does the patient require triage? ---Yes Related visit to physician within the last 2 weeks? ---No Does the PT have any chronic conditions? (i.e. diabetes, asthma, etc.) ---Yes List chronic conditions. ---BP rx, cholesterol rx, takes ASA Guidelines Guideline Title Affirmed Question Affirmed Notes Nurse Date/Time (Eastern Time) Bee or Yellow Jacket Sting Normal local reaction to bee, wasp, or yellow jacket sting (all triage questions negative) Marcelline Deist, RN, [facility] 05/30/2015 11:41:15 AM Disp. Time Eilene Ghazi Time) Disposition Final User 05/30/2015 11:02:54 AM Send To Clinical Follow Up Queue [facility] 05/30/2015 11:49:05 AM Home Care Yes Marcelline Deist, RN, Milana Kidney Understands: Yes PLEASE NOTE: All  timestamps contained within this report are represented as [place] Standard Time. CONFIDENTIALTY NOTICE: This fax transmission is intended only for the addressee. It contains information that is legally privileged, confidential or otherwise protected from use or disclosure. If you are not the intended recipient, you are strictly prohibited from reviewing, disclosing, copying using or disseminating any of this information or taking any action in reliance on or regarding this information. If you have received this fax in error, please notify us immediately by telephone so that we can arrange for its return to [place]. Phone: [phone], Toll-Free: [phone], Fax: [phone] Page: 2 of 2 Call Id: [id number] Disagree/Comply: Comply Care Advice Given Per Guideline HOME CARE: You should be able to treat this at home. MEAT TENDERIZER: * For immediate pain relief, apply a meat tenderizer-water solution on a cotton ball for 20 minutes ( TETANUS: * Tetanus vaccination (booster) after a bee sting is not necessary. * However, if it has been more than 10 years since your last tetanus vaccination, it is appropriate from a preventive health care standpoint to obtain a vaccination (i.e., Td or TDaP) sometime in the next couple weeks. CARE ADVICE given per Bee or Yellow Jacket Sting (Adult) guideline * Sting begins to look infected. CALL BACK IF: EXPECTED COURSE: * Pain: Severe pain or burning at the site lasts 1 to 2 hours. Pain after this period is usually minimal. Itching often follows the pain. * Redness and Swelling: Normal redness and swelling from the venom can increase for 24 hours following the sting. Redness at the sting site is normal. It doesn't mean that it is infected. The redness can last 3 days and the swelling 7 days. * Swelling becomes huge * If not available, apply a baking soda solution for 20 minutes. Do this just  once. PAIN MEDICINE: For pain relief, take acetaminophen. Typical adult dose is  650 mg. OTC ANTIHISTAMINE FOR ITCHING: * If the sting becomes itchy, take a dose of Benadryl (OTC diphenhydramine). HYDROCORTISONE CREAM FOR ITCHING: * Hydrocortisone cream applied to the sting area 4 times a day can also help reduce itching. Use it for a couple days, until the itch is mild. * Available OTC in [place] as 0.5% and 1% cream. REASSURANCE: * It sounds like a routine bee (wasp, yellow jacket) sting that we can treat at home. * The main symptoms are localized pain, swelling, itching, and mild redness at the sting site. REMOVE STINGER (if present): * There are several different methods of removal. Removing the stinger quickly is more important than the type of removal used. * The patient can grab it with his fingers, scrape it out with a credit card or finger nail, or use scotch tape. If only a small fragment remains, don't worry about it. It will shed with the skin. * In many cases no stinger will be present. Only bees leave their stingers. Wasps, yellow jackets, and hornets do not. APPLY LOCAL COLD - ICE MASSAGE METHOD * For pain, massage the area of the sting with an ice cube for 10 min prn. APPLY LOCAL COLD - COLD PACK METHOD: * You may repeat this as needed, to relieve symptoms of pain and swelling. After Care Instructions Given Call Event Type User Date / Time Description

## 2015-07-08 ENCOUNTER — Ambulatory Visit: Payer: 59

## 2015-07-08 ENCOUNTER — Ambulatory Visit
Admission: RE | Admit: 2015-07-08 | Discharge: 2015-07-08 | Disposition: A | Discharge status: Home / Self Care | Payer: 59 | Source: Ambulatory Visit

## 2015-07-08 DIAGNOSIS — Z1231 Encounter for screening mammogram for malignant neoplasm of breast: Secondary | ICD-10-CM

## 2015-07-15 ENCOUNTER — Other Ambulatory Visit: Payer: Self-pay | Admitting: Cardiology

## 2015-07-23 ENCOUNTER — Telehealth: Payer: Self-pay | Admitting: Cardiology

## 2015-07-23 NOTE — Telephone Encounter (Signed)
According to documentation, patient has not been checking BP to know if it was running high or not. She was advised to begin checking BP (has cuff and could check at work) and will keep a record and bring to already scheduled appointment with Dr. Aundra Dubin on 08/04/15.  Pt was advised that if her BP is high she needs to call us back.  She states she will go to the hospital if she has too.

## 2015-07-23 NOTE — Telephone Encounter (Signed)
Has BP been running high? Have her get [place] BP readings and needs followup with me.

## 2015-07-23 NOTE — Telephone Encounter (Signed)
Left message to call back  

## 2015-07-23 NOTE — Telephone Encounter (Signed)
New Message   Pt states she has been having headaches for 3 months and she  Would like to talk about it  Please call pt

## 2015-07-23 NOTE — Telephone Encounter (Signed)
Pt states that she has been having a "nagging" headache for about 3-4 months now. Pt states for the last 3-4 days the pain has also been in the right side of her neck, which is what truly concerns her. Pt denies dizziness, lightheadedness or CP. Pt states on scale of 1-10, pain is at a 4 and it doesn't keep her from working or doing daily activities. Pt does not check her vitals. Pt states that she was also overdue to see Dr. Aundra Dubin.  Scheduled pt for 08/04/15 @ 3:15pm. Advised pt to check her BP and keep a log to bring with her to her appt. Will forward to Dr. Aundra Dubin for review and advisement.

## 2015-08-04 ENCOUNTER — Encounter: Payer: Self-pay | Admitting: Cardiology

## 2015-08-04 ENCOUNTER — Ambulatory Visit (INDEPENDENT_AMBULATORY_CARE_PROVIDER_SITE_OTHER): Payer: 59 | Admitting: Cardiology

## 2015-08-04 ENCOUNTER — Encounter: Payer: Self-pay | Admitting: *Deleted

## 2015-08-04 VITALS — BP 128/68 | HR 82 | Ht 67.5 in | Wt 184.8 lb

## 2015-08-04 DIAGNOSIS — I340 Nonrheumatic mitral (valve) insufficiency: Secondary | ICD-10-CM | POA: Diagnosis not present

## 2015-08-04 DIAGNOSIS — I739 Peripheral vascular disease, unspecified: Secondary | ICD-10-CM | POA: Diagnosis not present

## 2015-08-04 DIAGNOSIS — I6523 Occlusion and stenosis of bilateral carotid arteries: Secondary | ICD-10-CM | POA: Diagnosis not present

## 2015-08-04 DIAGNOSIS — I10 Essential (primary) hypertension: Secondary | ICD-10-CM | POA: Diagnosis not present

## 2015-08-04 LAB — BASIC METABOLIC PANEL
BUN: 15 mg/dL (ref 7–25)
CHLORIDE: 103 mmol/L (ref 98–110)
CO2: 28 mmol/L (ref 20–31)
Calcium: 9.3 mg/dL (ref 8.6–10.4)
Creat: 0.98 mg/dL (ref 0.50–0.99)
Glucose, Bld: 99 mg/dL (ref 65–99)
Potassium: 3.9 mmol/L (ref 3.5–5.3)
SODIUM: 138 mmol/L (ref 135–146)

## 2015-08-04 LAB — MAGNESIUM: MAGNESIUM: 2 mg/dL (ref 1.5–2.5)

## 2015-08-04 MED ORDER — ATORVASTATIN CALCIUM 40 MG PO TABS: 40.0000 mg | ORAL_TABLET | Freq: Every day | ORAL | Status: DC

## 2015-08-04 MED ORDER — AMLODIPINE BESYLATE 10 MG PO TABS: 10.0000 mg | ORAL_TABLET | Freq: Every day | ORAL | Status: DC

## 2015-08-04 MED ORDER — SPIRONOLACTONE 25 MG PO TABS
ORAL_TABLET | ORAL | Status: DC
Start: 2015-08-04 — End: 2016-04-15

## 2015-08-04 MED ORDER — LABETALOL HCL 300 MG PO TABS: 300.0000 mg | ORAL_TABLET | Freq: Two times a day (BID) | ORAL | Status: DC

## 2015-08-04 NOTE — Patient Instructions (Signed)
Medication Instructions:  Take magnesium oxide 400mg  at bedtime for leg cramps.   Labwork: BMET/BNP/Magnesium level today  Testing/Procedures: Your physician has requested that you have an echocardiogram. Echocardiography is a painless test that uses sound waves to create images of your heart. It provides your doctor with information about the size and shape of your heart and how well your heart's chambers and valves are working. This procedure takes approximately one hour. There are no restrictions for this procedure.  Your physician has requested that you have a lower  extremity arterial duplex. This test is an ultrasound of the arteries in the legs . It looks at arterial blood flow in the legs . Allow one hour for Lower Arterial scans. There are no restrictions or special instructions   Follow-Up: Your physician wants you to follow-up in: 6 months with Dr Aundra Dubin. (April 2017).You will receive a reminder letter in the mail two months in advance. If you don't receive a letter, please call our office to schedule the follow-up appointment.        If you need a refill on your cardiac medications before your next appointment, please call your pharmacy.

## 2015-08-04 NOTE — Progress Notes (Signed)
Patient ID: Laura Poole, female   DOB: Jun 10, 1951, 64 y.o.   MRN: [id number] PCP: Dr. Linna Darner  64 yo with history of breast cancer, carotid stenosis, and atypical chest pain as well as exertional dyspnea returns for cardiology evaluation.  Patient has had a history of nonexertional chest pain.  Last myoview was in 4/11 and showed no evidence for ischemia.  No chest pain recently.  Echo in 8/12 showed EF 65-70% with moderate mitral regurgitation and mild diastolic dysfunction.  BNP was not significantly elevated.  LHC in 4/13 showed no coronary disease.    She has shortness of breath walking up stairs or walking a long distance.  This is chronic.  She gets spasms/severe cramps in her legs at night.  This has been going on for > 1 year.  She also notes after walking about 100 yards at work.  This is her main complaint.   ECG; NSR, normal  Labs (1/12): creatinine 0.93 Labs (8/12): K 3.7, creatinine 0.7, BNP 65, LDL 63, HDL 63 Labs (1/13): K 4, creatinine 0.9 Labs (4/13): K 3.9, creatinine 0.9 Labs (7/14): LDL 116, HDL 64.5 Labs (3/16): LDL 67, HDL 58 Labs (8/16): K 3.8, creatinine 0.98, TSH normal, HCT 39  PMH: 1. Breast cancer (2006): Treated with chemo and radiation. 2. GERD 3. Colon polyps 4. HTN 5. Hyperlipidemia 6. Atypical chest pain: Myoview (4/11) with EF 70%, breast attenuation, no ischemia.  Coronary CTA (4/13): Difficult study with attenuation obscuring portions of the RCA and LAD.  LHC (4/13): No angiographic CAD, EF 60%.   7. Exertional dyspnea: Echo (2008) with EF 60%, moderate LV hypertrophy, MVP, mild MR.  Echo (8/12): EF 60-65%, mild LV hypertrophy, grade I diastolic dysfunction, moderate MR.  8. Carotid stenosis: 2/13 carotids with 09-47% RICA, 09-62% LICA.  Carotid dopplers (9/15) with elevated velocities likely due to tortuousity, no plaque.  9. ABIs 2/13 were normal.   SH: Takes care of mother and 3 grandchildren.  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  Nonsmoker.   FH: Father  with MI at 14, Brother with MI at 43.  ROS: All systems reviewed and negative except as per HPI.    Current Outpatient Prescriptions  Medication Sig Dispense Refill  . amLODipine (NORVASC) 10 MG tablet Take 1 tablet (10 mg total) by mouth daily. 90 tablet 3  . aspirin 81 MG tablet Take 1 tablet (81 mg total) by mouth daily. 30 tablet 6  . atorvastatin (LIPITOR) 40 MG tablet Take 1 tablet (40 mg total) by mouth daily. 90 tablet 3  . cetirizine (ZYRTEC) 10 MG tablet Take 10 mg by mouth daily.    . Cholecalciferol (VITAMIN D3) 1000 UNITS CAPS Take 1 capsule by mouth daily.     [name] labetalol (NORMODYNE) 300 MG tablet Take 1 tablet (300 mg total) by mouth 2 (two) times daily. 180 tablet 3  . pantoprazole (PROTONIX) 40 MG tablet Take 1 tablet (40 mg total) by mouth 2 (two) times daily before a meal. 180 tablet 3  . spironolactone (ALDACTONE) 25 MG tablet TAKE 1 TABLET BY MOUTH ONCE DAILY IN PLACE OF GENERIC DYAZIDE AND POTASSIUM SUPPLEMENT 90 tablet 3  . tiZANidine (ZANAFLEX) 4 MG tablet Take 1 tablet (4 mg total) by mouth 3 (three) times daily. 90 tablet 2  . zolpidem (AMBIEN) 10 MG tablet Take 1 tablet (10 mg total) by mouth at bedtime as needed. for sleep 30 tablet 2  . magnesium oxide (MAG-OX) 400 MG tablet 1 tablet by mouth at bedtime  No current facility-administered medications for this visit.    BP 128/68 mmHg  Pulse 82  Ht 5' 7.5" (1.715 m)  Wt 184 lb 12.8 oz (83.825 kg)  BMI 28.50 kg/m2 General: NAD Neck: No JVD, no thyromegaly or thyroid nodule.  Lungs: Clear to auscultation bilaterally with normal respiratory effort. CV: Nondisplaced PMI.  Heart regular S1/S2, no S3/S4, 1/6 HSM at apex.  No peripheral edema.  No carotid bruits.  Difficult to palpate pedal pulses.  Abdomen: Soft, nontender, no hepatosplenomegaly, no distention.  Neurologic: Alert and oriented x 3.  Psych: Normal affect. Extremities: No clubbing or cyanosis.   Assessment/Plan: 1. Chest pain: No recent chest  pain, LHC without significant coronary disease in 2013.  2. Carotid stenosis: Elevated velocities with carotid dopplers likely due to tortuousity.  No plaque noted.  3. Hyperlipidemia: Good lipids in 3/16, continue statin.   4. HTN: BP under good control.  Check BMET today with spironolactone use.  5. Leg pain: Cramps at night, also pain when walking at work.  Difficult to palpate pedal pulses.  I will arrange for peripheral arterial doppler evaluation.  She can also try magnesium oxide to see if this helps the cramping.  6. Mitral regurgitation: History of moderate MR.  Given her exertional dyspnea, will repeat echo.   Loralie Champagne 08/05/2015

## 2015-08-05 ENCOUNTER — Other Ambulatory Visit: Payer: Self-pay | Admitting: Cardiology

## 2015-08-05 ENCOUNTER — Telehealth: Payer: Self-pay | Admitting: *Deleted

## 2015-08-05 DIAGNOSIS — I340 Nonrheumatic mitral (valve) insufficiency: Secondary | ICD-10-CM

## 2015-08-05 HISTORY — DX: Nonrheumatic mitral (valve) insufficiency: I34.0

## 2015-08-05 LAB — BRAIN NATRIURETIC PEPTIDE: Brain Natriuretic Peptide: 12.3 pg/mL (ref 0.0–100.0)

## 2015-08-05 NOTE — Telephone Encounter (Signed)
called for ambien refill, routing to [facility] for further advisment.

## 2015-08-05 NOTE — Telephone Encounter (Signed)
Hi Ann, I am sending this refill request to you due to it being for ambien. I do not feel comfortable refilling this MED. Thank you for your time.

## 2015-08-05 NOTE — Telephone Encounter (Signed)
Dr Aundra Dubin-- do you want to refill ambien 10mg  HS prn? If so how many tablets and refills?

## 2015-08-06 ENCOUNTER — Encounter: Payer: Self-pay | Admitting: Cardiology

## 2015-08-06 ENCOUNTER — Other Ambulatory Visit: Payer: Self-pay | Admitting: Cardiology

## 2015-08-06 ENCOUNTER — Telehealth: Payer: Self-pay | Admitting: Cardiology

## 2015-08-06 NOTE — Telephone Encounter (Signed)
New message    Patient calling back to speak with nurse from today.

## 2015-08-06 NOTE — Telephone Encounter (Signed)
F/u    Pt returning Katy's phone call.

## 2015-08-06 NOTE — Telephone Encounter (Signed)
This encounter was created in error - please disregard.

## 2015-08-06 NOTE — Telephone Encounter (Signed)
Pt is aware of lab results.

## 2015-08-06 NOTE — Telephone Encounter (Signed)
Left pt a message to call back. 

## 2015-08-07 NOTE — Telephone Encounter (Signed)
[place] for me to phone this in?

## 2015-08-08 ENCOUNTER — Telehealth: Payer: Self-pay | Admitting: *Deleted

## 2015-08-08 ENCOUNTER — Other Ambulatory Visit: Payer: Self-pay | Admitting: Cardiology

## 2015-08-08 DIAGNOSIS — I739 Peripheral vascular disease, unspecified: Secondary | ICD-10-CM

## 2015-08-08 DIAGNOSIS — I340 Nonrheumatic mitral (valve) insufficiency: Secondary | ICD-10-CM

## 2015-08-08 MED ORDER — ZOLPIDEM TARTRATE 10 MG PO TABS: 10.0000 mg | ORAL_TABLET | Freq: Every evening | ORAL | Status: DC | PRN

## 2015-08-08 NOTE — Telephone Encounter (Signed)
That would be fine.  I have received 3 messages about this now, please send in.

## 2015-08-08 NOTE — Telephone Encounter (Signed)
Called in as requested

## 2015-08-08 NOTE — Telephone Encounter (Signed)
Called and verified at [facility] this had not been filled, called in as requested   Larey Dresser, MD at 08/08/2015 10:52 AM     Status: Signed       Expand All Collapse All   That would be fine. I have received 3 messages about this now, please send in.             Roberts Gaudy, CMA at 08/05/2015 1:15 PM     Status: Signed       Expand All Collapse All   called for ambien refill, routing to [facility] for further advisment.

## 2015-08-11 ENCOUNTER — Other Ambulatory Visit (HOSPITAL_COMMUNITY): Payer: 59

## 2015-08-15 ENCOUNTER — Inpatient Hospital Stay (HOSPITAL_COMMUNITY): Admission: RE | Admit: 2015-08-15 | Payer: 59 | Source: Ambulatory Visit

## 2015-09-01 ENCOUNTER — Encounter: Payer: Self-pay | Admitting: Family

## 2015-09-01 ENCOUNTER — Ambulatory Visit (INDEPENDENT_AMBULATORY_CARE_PROVIDER_SITE_OTHER): Payer: 59 | Admitting: Family

## 2015-09-01 VITALS — BP 158/100 | HR 78 | Temp 97.8°F | Resp 18 | Ht 67.5 in | Wt 183.0 lb

## 2015-09-01 DIAGNOSIS — R252 Cramp and spasm: Secondary | ICD-10-CM | POA: Diagnosis not present

## 2015-09-01 HISTORY — DX: Cramp and spasm: R25.2

## 2015-09-01 NOTE — Progress Notes (Signed)
Subjective:    Patient ID: Laura Poole, female    DOB: 15-Dec-1950, 64 y.o.   MRN: [id number]  Chief Complaint  Patient presents with  . Leg Swelling    has bad cramping in her legs and feet and ankle swelling, x2 months gets so painful to where she can barely walk    HPI:  Laura Poole is a 64 y.o. female who  has a past medical history of History of breast cancer (2006); GERD (gastroesophageal reflux disease); History of colonic polyps; HTN (hypertension); HLD (hyperlipidemia); H/O: hysterectomy; and Chest pain. and presents today for an acute office visit.  Associated symptoms of cramping in her legs and ankles has been going on for approximately 2 months with a severity that becomes so painful she is unable to walk. Recently seen by cardiology for concern for claudication with lower extremity arterial doppler which no results are found as it was not completed it was the second or third time she took the test. It was also recommended that she try magnesium. Previous sodium, potassium and magnesium were within the normal limits about 1 month ago. Timing of the symptoms is primarily during the night and described as spasms. Modifying factors include massage that helps it to go away. Most recently they have worsened and the location goes from her feet to her mid-thigh. Frequency is daily. Tries to drink a lot of water. Averages about 4-5 cups of caffeine daily. Does not currently exercise or do any stretching. Magnesium that was recommended has not helped with her cramps.   No Known Allergies   Current Outpatient Prescriptions on File Prior to Visit  Medication Sig Dispense Refill  . amLODipine (NORVASC) 10 MG tablet Take 1 tablet (10 mg total) by mouth daily. 90 tablet 3  . aspirin 81 MG tablet Take 1 tablet (81 mg total) by mouth daily. 30 tablet 6  . atorvastatin (LIPITOR) 40 MG tablet Take 1 tablet (40 mg total) by mouth daily. 90 tablet 3  . cetirizine (ZYRTEC) 10 MG tablet Take  10 mg by mouth daily.    . Cholecalciferol (VITAMIN D3) 1000 UNITS CAPS Take 1 capsule by mouth daily.     [name] labetalol (NORMODYNE) 300 MG tablet Take 1 tablet (300 mg total) by mouth 2 (two) times daily. 180 tablet 3  . magnesium oxide (MAG-OX) 400 MG tablet 1 tablet by mouth at bedtime    . pantoprazole (PROTONIX) 40 MG tablet Take 1 tablet (40 mg total) by mouth 2 (two) times daily before a meal. 180 tablet 3  . spironolactone (ALDACTONE) 25 MG tablet TAKE 1 TABLET BY MOUTH ONCE DAILY IN PLACE OF GENERIC DYAZIDE AND POTASSIUM SUPPLEMENT 90 tablet 3  . tiZANidine (ZANAFLEX) 4 MG tablet Take 1 tablet (4 mg total) by mouth 3 (three) times daily. 90 tablet 2  . zolpidem (AMBIEN) 10 MG tablet Take 1 tablet (10 mg total) by mouth at bedtime as needed. for sleep 30 tablet 1   No current facility-administered medications on file prior to visit.      Review of Systems  Constitutional: Negative for fever and chills.  Respiratory: Negative for chest tightness and shortness of breath.   Cardiovascular: Negative for chest pain, palpitations and leg swelling.  Musculoskeletal:       Positive for muscle cramps.       Objective:    BP 158/100 mmHg  Pulse 78  Temp(Src) 97.8 F (36.6 C) (Oral)  Resp 18  Ht 5' 7.5" (1.715 m)  Wt 183 lb (83.008 kg)  BMI 28.22 kg/m2  SpO2 98% Nursing note and vital signs reviewed.  Physical Exam  Constitutional: She is oriented to person, place, and time. She appears well-developed and well-nourished. No distress.  Cardiovascular: Normal rate, regular rhythm, normal heart sounds and intact distal pulses.   Pulmonary/Chest: Effort normal and breath sounds normal.  Musculoskeletal:  Bilateral lower extremities - no obvious deformity, discoloration, with mild edema. Mild tenderness elicited bilateral calves and medial aspects of the knee. Range of motion and strength are normal in the knee and ankle. Negative Homans sign. Distal pulses are mildly decreased.    Neurological: She is alert and oriented to person, place, and time.  Skin: Skin is warm and dry.  Psychiatric: She has a normal mood and affect. Her behavior is normal. Judgment and thought content normal.       Assessment & Plan:   Problem List Items Addressed This Visit      Other   Bilateral leg cramps - Primary    Bilateral leg cramps of undetermined origin however cannot rule out medication side effects, claudication, or restless leg.[name] Previous blood work appears within the normal limits. Is scheduled to undergo lower extremity arterial Dopplers to rule out claudication. Question possible relation to atorvastatin. We will contact cardiology. Continue current dosage of tizanidine as needed for muscle cramps and spasms. Treat conservatively with heat/stretching multiple times throughout the day. Possible referral to neurology.

## 2015-09-01 NOTE — Progress Notes (Signed)
Pre visit review using our clinic review tool, if applicable. No additional management support is needed unless otherwise documented below in the visit note. 

## 2015-09-01 NOTE — Patient Instructions (Signed)
Thank you for choosing [facility].  Summary/Instructions:  Please stretch as indicated 2-3 times per day and then before bed.   If your symptoms worsen or fail to improve, please contact our office for further instruction, or in case of emergency go directly to the emergency room at the closest medical facility.   Leg Cramps Leg cramps occur when a muscle or muscles tighten and you have no control over this tightening (involuntary muscle contraction). Muscle cramps can develop in any muscle, but the most common place is in the calf muscles of the leg. Those cramps can occur during exercise or when you are at rest. Leg cramps are painful, and they may last for a few seconds to a few minutes. Cramps may return several times before they finally stop. Usually, leg cramps are not caused by a serious medical problem. In many cases, the cause is not known. Some common causes include:  Overexertion.  Overuse from repetitive motions, or doing the same thing over and over.  Remaining in a certain position for a long period of time.  Improper preparation, form, or technique while performing a sport or an activity.  Dehydration.  Injury.  Side effects of some medicines.  Abnormally low levels of the salts and ions in your blood (electrolytes), especially potassium and calcium. These levels could be low if you are taking water pills (diuretics) or if you are pregnant. HOME CARE INSTRUCTIONS Watch your condition for any changes. Taking the following actions may help to lessen any discomfort that you are feeling:  Stay well-hydrated. Drink enough fluid to keep your urine clear or pale yellow.  Try massaging, stretching, and relaxing the affected muscle. Do this for several minutes at a time.  For tight or tense muscles, use a warm towel, heating pad, or hot shower water directed to the affected area.  If you are sore or have pain after a cramp, applying ice to the affected area may  relieve discomfort.  Put ice in a plastic bag.  Place a towel between your skin and the bag.  Leave the ice on for 20 minutes, 2-3 times per day.  Avoid strenuous exercise for several days if you have been having frequent leg cramps.  Make sure that your diet includes the essential minerals for your muscles to work normally.  Take medicines only as directed by your health care provider. SEEK MEDICAL CARE IF:  Your leg cramps get more severe or more frequent, or they do not improve over time.  Your foot becomes cold, numb, or blue.   This information is not intended to replace advice given to you by your health care provider. Make sure you discuss any questions you have with your health care provider.   Document Released: 11/04/2004 Document Revised: 02/11/2015 Document Reviewed: 09/04/2014 Elsevier Interactive Patient Education [employer].

## 2015-09-01 NOTE — Assessment & Plan Note (Signed)
Bilateral leg cramps of undetermined origin however cannot rule out medication side effects, claudication, or restless leg.[name] Previous blood work appears within the normal limits. Is scheduled to undergo lower extremity arterial Dopplers to rule out claudication. Question possible relation to atorvastatin. We will contact cardiology. Continue current dosage of tizanidine as needed for muscle cramps and spasms. Treat conservatively with heat/stretching multiple times throughout the day. Possible referral to neurology.

## 2015-09-02 ENCOUNTER — Telehealth: Payer: Self-pay | Admitting: Internal Medicine

## 2015-09-02 NOTE — Telephone Encounter (Signed)
Pt called in and has some question about the crestor that she is suppose to go one?  She would like to a call back from nurse   Best number [phone] till 3pm

## 2015-09-02 NOTE — Telephone Encounter (Signed)
Left message for pt to call back  °

## 2015-09-02 NOTE — Telephone Encounter (Signed)
Please inform patient that I would like her to stop taking the atorvastatin for at least 1 week and start taking CoEnzyme Q10. We can then retry starting her on Crestor.

## 2015-09-02 NOTE — Telephone Encounter (Signed)
Pt would like to start the Crestor Please advise dose. Pt would like RX sent to [facility] out patient.

## 2015-09-03 ENCOUNTER — Telehealth: Payer: Self-pay | Admitting: *Deleted

## 2015-09-03 DIAGNOSIS — I10 Essential (primary) hypertension: Secondary | ICD-10-CM

## 2015-09-03 DIAGNOSIS — I6529 Occlusion and stenosis of unspecified carotid artery: Secondary | ICD-10-CM

## 2015-09-03 NOTE — Telephone Encounter (Signed)
Left message to call back to change medication and schedule lab as ordered by Dr Aundra Dubin.

## 2015-09-03 NOTE — Telephone Encounter (Signed)
LVM letting her know.  

## 2015-09-03 NOTE — Telephone Encounter (Signed)
Please call pt [phone] after 3 pm when you clarify with dr. She states Dr. Aundra Dubin was going to send in the crestor but he has not done it.

## 2015-09-03 NOTE — Telephone Encounter (Signed)
-----   Message from Larey Dresser, MD sent at 09/03/2015 11:09 AM EST ----- Please call in prescription for Crestor 10 mg daily. She will stop atorvastatin.  Will need lipids/LFTs in 2 months.

## 2015-09-08 MED ORDER — ROSUVASTATIN CALCIUM 10 MG PO TABS: 10.0000 mg | ORAL_TABLET | Freq: Every day | ORAL | Status: DC

## 2015-09-08 NOTE — Telephone Encounter (Signed)
Pt advised to stop atorvastatin, start crestor 10mg  daily. Pt verbalized understanding, repeat fasting lipid/liver profile  scheduled for 11/04/15.

## 2015-09-10 ENCOUNTER — Other Ambulatory Visit: Payer: Self-pay

## 2015-09-10 ENCOUNTER — Ambulatory Visit (HOSPITAL_BASED_OUTPATIENT_CLINIC_OR_DEPARTMENT_OTHER): Payer: 59

## 2015-09-10 ENCOUNTER — Ambulatory Visit (HOSPITAL_COMMUNITY)
Admission: RE | Admit: 2015-09-10 | Discharge: 2015-09-10 | Disposition: A | Discharge status: Home / Self Care | Payer: 59 | Source: Ambulatory Visit | Attending: Internal Medicine | Admitting: Internal Medicine

## 2015-09-10 DIAGNOSIS — I10 Essential (primary) hypertension: Secondary | ICD-10-CM | POA: Diagnosis not present

## 2015-09-10 DIAGNOSIS — I340 Nonrheumatic mitral (valve) insufficiency: Secondary | ICD-10-CM | POA: Diagnosis not present

## 2015-09-10 DIAGNOSIS — I358 Other nonrheumatic aortic valve disorders: Secondary | ICD-10-CM | POA: Insufficient documentation

## 2015-09-10 DIAGNOSIS — I739 Peripheral vascular disease, unspecified: Secondary | ICD-10-CM

## 2015-09-10 DIAGNOSIS — E785 Hyperlipidemia, unspecified: Secondary | ICD-10-CM | POA: Insufficient documentation

## 2015-09-10 DIAGNOSIS — I517 Cardiomegaly: Secondary | ICD-10-CM | POA: Insufficient documentation

## 2015-10-01 ENCOUNTER — Telehealth: Payer: Self-pay

## 2015-10-01 NOTE — Telephone Encounter (Signed)
Left message advising patient that she can call to schedule nurse visit or call to give [place] flu vaccine information if already recd somewhere else

## 2015-10-21 MED FILL — ROSUVASTATIN CALCIUM 10 MG: 10 | 30 days supply | Qty: 30 | Fill #0

## 2015-10-22 MED FILL — PAZEO 0.7% EYE DROPS: 0.7 | 25 days supply | Qty: 3 | Fill #0

## 2015-10-22 MED FILL — MYRBETRIQ ER 25 MG TABLET: 25 | 30 days supply | Qty: 30 | Fill #2

## 2015-10-30 ENCOUNTER — Other Ambulatory Visit: Payer: Self-pay | Admitting: Cardiology

## 2015-10-30 DIAGNOSIS — M7741 Metatarsalgia, right foot: Secondary | ICD-10-CM | POA: Diagnosis not present

## 2015-10-30 DIAGNOSIS — M2041 Other hammer toe(s) (acquired), right foot: Secondary | ICD-10-CM | POA: Diagnosis not present

## 2015-10-30 NOTE — Telephone Encounter (Signed)
Dr Aundra Dubin -is it okay to refill ambien?

## 2015-11-04 ENCOUNTER — Other Ambulatory Visit (INDEPENDENT_AMBULATORY_CARE_PROVIDER_SITE_OTHER): Payer: 59 | Admitting: *Deleted

## 2015-11-04 DIAGNOSIS — I10 Essential (primary) hypertension: Secondary | ICD-10-CM | POA: Diagnosis not present

## 2015-11-04 DIAGNOSIS — I6529 Occlusion and stenosis of unspecified carotid artery: Secondary | ICD-10-CM | POA: Diagnosis not present

## 2015-11-04 LAB — LIPID PANEL
Cholesterol: 147 mg/dL (ref 125–200)
HDL: 65 mg/dL (ref >=46)
LDL Cholesterol: 66 mg/dL (ref <130)
TRIGLYCERIDES: 80 mg/dL (ref <150)
Total CHOL/HDL Ratio: 2.3 Ratio (ref <=5.0)
VLDL: 16 mg/dL (ref <30)

## 2015-11-04 LAB — HEPATIC FUNCTION PANEL
ALBUMIN: 4.1 g/dL (ref 3.6–5.1)
ALK PHOS: 53 U/L (ref 33–130)
ALT: 19 U/L (ref 6–29)
AST: 17 U/L (ref 10–35)
BILIRUBIN TOTAL: 0.4 mg/dL (ref 0.2–1.2)
Bilirubin, Direct: 0.1 mg/dL (ref <=0.2)
Indirect Bilirubin: 0.3 mg/dL (ref 0.2–1.2)
TOTAL PROTEIN: 7 g/dL (ref 6.1–8.1)

## 2015-11-06 ENCOUNTER — Encounter: Payer: Self-pay | Admitting: Internal Medicine

## 2015-11-06 ENCOUNTER — Ambulatory Visit (INDEPENDENT_AMBULATORY_CARE_PROVIDER_SITE_OTHER): Payer: 59 | Admitting: Internal Medicine

## 2015-11-06 VITALS — BP 142/100 | HR 87 | Temp 98.4°F | Ht 67.5 in | Wt 184.0 lb

## 2015-11-06 DIAGNOSIS — L03115 Cellulitis of right lower limb: Secondary | ICD-10-CM

## 2015-11-06 DIAGNOSIS — H6501 Acute serous otitis media, right ear: Secondary | ICD-10-CM | POA: Diagnosis not present

## 2015-11-06 MED ORDER — SULFAMETHOXAZOLE-TRIMETHOPRIM 800-160 MG PO TABS: 1.0000 | ORAL_TABLET | Freq: Two times a day (BID) | ORAL | Status: DC

## 2015-11-06 MED ORDER — FLUTICASONE PROPIONATE 50 MCG/ACT NA SUSP: 1.0000 | Freq: Two times a day (BID) | NASAL | Status: DC | PRN

## 2015-11-06 MED FILL — SULFAMETHOXAZOLE/TMP DS TAB: 800-160 | 10 days supply | Qty: 20 | Fill #0

## 2015-11-06 MED FILL — FLUTICASONE PROP 50 MCG SPR: 50 | 30 days supply | Qty: 16 | Fill #0

## 2015-11-06 NOTE — Progress Notes (Signed)
   Subjective:    Patient ID: Laura Poole, female    DOB: 05-11-1951, 65 y.o.   MRN: [id number]  HPI  She describes a tender erythematous lesion over the right shin which has enlarged over the last 2 weeks. She does have a history of MRSA and had an abscess incised and drained from the posterior thorax remotely.  She's had some chills and sweats.  She's also had unrelated right ear pressure.  She did not take her blood pressure pill last night.  Review of Systems  No associated itchy, watery eyes.  Swelling of the lips or tongue or intraoral lesions denied.  Shortness of breath, wheezing, or cough absent.  No vesicles, pustules or urticaria noted.  Fever ,chills , or sweats denied.   Diarrhea not present.  No dysuria, pyuria or hematuria.     Objective:   Physical Exam  Pertinent or positive findings include: The right tympanic membrane is dull without erythema or discharge. She has a 4.5 x 3.5 erythematous patch over the right shin which is tender to touch. She has 1/2+ edema.  General appearance :adequately nourished; in no distress.  Eyes: No conjunctival inflammation or scleral icterus is present.  Oral exam:  Lips and gums are healthy appearing.There is no oropharyngeal erythema or exudate noted. Dental hygiene is good.  Heart:  Normal rate and regular rhythm. S1 and S2 normal without gallop, murmur, click, rub or other extra sounds    Lungs:Chest clear to auscultation; no wheezes, rhonchi,rales ,or rubs present.No increased work of breathing.   Abdomen: bowel sounds normal, soft and non-tender without masses, organomegaly or hernias noted.  No guarding or rebound.   Vascular : all pulses equal ; no bruits present.  Skin:Warm & dry.  Intact without suspicious lesions or rashes ; no tenting   Lymphatic: No lymphadenopathy is noted about the head, neck, axilla.   Neuro: Strength, tone normal.      Assessment & Plan:  #1 cellulitis; rule out MRSA  #2  elevated blood pressure; she did not take her blood pressure medication last night  #3 serous otitis  Plan: See orders and recommendations

## 2015-11-06 NOTE — Patient Instructions (Addendum)
Fill the tub half full and add cap full of bleach. Stir the  water extremely well and then bathe; cleansing areas with  lesions . Repeat this once a week x3. This is to eradicate recurrent skin infections.  Flonase 1 spray in each nostril twice a day as needed. Use the "crossover" technique into opposite nostril spraying toward opposite ear @ 45 degree angle, not straight up into nostril. Plain Allegra (NOT D )  160 daily , Loratidine 10 mg , OR Zyrtec 10 mg @ bedtime  as needed for itchy eyes & sneezing.  Minimal Blood Pressure Goal= AVERAGE < 140/90;  Ideal is an AVERAGE < 135/85. This AVERAGE should be calculated from @ least 5-7 BP readings taken @ different times of day on different days of week. You should not respond to isolated BP readings , but rather the AVERAGE for that week .Please bring your  blood pressure cuff to office visits to verify that it is reliable.It  can also be checked against the blood pressure device at the pharmacy. Finger or wrist cuffs are not dependable; an arm cuff is.  Increase spironolactone to twice a day if blood pressure remains over 140/90 on average.

## 2015-11-06 NOTE — Progress Notes (Signed)
Pre visit review using our clinic review tool, if applicable. No additional management support is needed unless otherwise documented below in the visit note. 

## 2015-11-07 MED FILL — SPIRONOLACTONE 25 MG TABLET: 25 | 90 days supply | Qty: 90 | Fill #0

## 2015-11-07 MED FILL — AMLODIPINE BESYLATE 10 MG T: 10 | 90 days supply | Qty: 90 | Fill #0

## 2015-11-10 ENCOUNTER — Telehealth: Payer: Self-pay | Admitting: *Deleted

## 2015-11-10 ENCOUNTER — Other Ambulatory Visit: Payer: Self-pay | Admitting: *Deleted

## 2015-11-10 MED ORDER — ZOLPIDEM TARTRATE 10 MG PO TABS: 10.0000 mg | ORAL_TABLET | Freq: Every evening | ORAL | Status: DC | PRN

## 2015-11-10 NOTE — Telephone Encounter (Signed)
LM AMBIEN FILLED  AS  REQUESTED  OKAY PER  DR MCLEAN ./CY

## 2015-11-10 NOTE — Telephone Encounter (Signed)
-----   Message from Larey Dresser, MD sent at 11/05/2015  2:00 PM EST ----- That is fine.  ----- Message -----    From: Richmond Campbell, LPN    Sent: [id number]   1:42 PM      To: Larey Dresser, MD  CALLED PT  TO GIVE  LAB RESULTS  AND  PT  ASKED IF YOU WOULD  FILL AMBIEN PT  AWARE  WILL  DISCUSS  WITH  DR [facility] .

## 2015-11-17 MED FILL — ZOLPIDEM TARTRATE 10 MG TAB: 10 | 30 days supply | Qty: 30 | Fill #0

## 2015-11-29 ENCOUNTER — Emergency Department (HOSPITAL_COMMUNITY)
Admission: EM | Admit: 2015-11-29 | Discharge: 2015-11-29 | Disposition: A | Discharge status: Home / Self Care | Payer: 59 | Source: Home / Self Care

## 2015-12-08 DIAGNOSIS — H1033 Unspecified acute conjunctivitis, bilateral: Secondary | ICD-10-CM | POA: Diagnosis not present

## 2015-12-08 MED FILL — TOBRADEX ST EYE DROPS: 0.3-0.05 | 12 days supply | Qty: 5 | Fill #0

## 2015-12-15 MED FILL — MYRBETRIQ ER 25 MG TABLET: 25 | 30 days supply | Qty: 30 | Fill #3

## 2015-12-15 MED FILL — ZOLPIDEM TARTRATE 10 MG TAB: 10 | 30 days supply | Qty: 30 | Fill #1

## 2015-12-19 MED FILL — ROSUVASTATIN CALCIUM 10 MG: 10 | 30 days supply | Qty: 30 | Fill #1

## 2016-01-07 MED FILL — tiZANidine HCL 4 MG TABS: 4 | 30 days supply | Qty: 90 | Fill #1

## 2016-01-27 DIAGNOSIS — Z0100 Encounter for examination of eyes and vision without abnormal findings: Secondary | ICD-10-CM | POA: Diagnosis not present

## 2016-02-16 MED FILL — ROSUVASTATIN CALCIUM 10 MG: 10 | 30 days supply | Qty: 30 | Fill #2

## 2016-02-17 ENCOUNTER — Other Ambulatory Visit: Payer: Self-pay | Admitting: Cardiology

## 2016-02-17 MED FILL — MYRBETRIQ ER 25 MG TABLET: 25 | 30 days supply | Qty: 30 | Fill #4

## 2016-02-19 NOTE — Telephone Encounter (Signed)
To Dr Aundra Dubin for approval to refill Laura Poole

## 2016-03-15 ENCOUNTER — Encounter: Payer: Self-pay | Admitting: Family

## 2016-03-15 ENCOUNTER — Ambulatory Visit (INDEPENDENT_AMBULATORY_CARE_PROVIDER_SITE_OTHER): Payer: 59 | Admitting: Family

## 2016-03-15 VITALS — BP 122/88 | HR 86 | Temp 98.2°F | Ht 67.5 in | Wt 183.8 lb

## 2016-03-15 DIAGNOSIS — R42 Dizziness and giddiness: Secondary | ICD-10-CM | POA: Diagnosis not present

## 2016-03-15 NOTE — Patient Instructions (Addendum)
At this time, the etiology of dizziness episode today is nonspecific at this time. EKG is normal which is very reassuring. I have some concern that due to  history of carotid stenosis and mitral regurgitation that these may be the cause. More commonly, we see patients on antihypertensives have episodes of dizziness. Please monitor symptoms closely and if he had another episode please call our office immediately. I placed a referral for an appointment to be made with cardiology to further evaluate you as well.   Dizziness Dizziness is a common problem. It is a feeling of unsteadiness or light-headedness. You may feel like you are about to faint. Dizziness can lead to injury if you stumble or fall. Anyone can become dizzy, but dizziness is more common in older adults. This condition can be caused by a number of things, including medicines, dehydration, or illness. HOME CARE INSTRUCTIONS Taking these steps may help with your condition: Eating and Drinking  Drink enough fluid to keep your urine clear or pale yellow. This helps to keep you from becoming dehydrated. Try to drink more clear fluids, such as water.  Do not drink alcohol.  Limit your caffeine intake if directed by your health care provider.  Limit your salt intake if directed by your health care provider. Activity  Avoid making quick movements.  Rise slowly from chairs and steady yourself until you feel okay.  In the morning, first sit up on the side of the bed. When you feel okay, stand slowly while you hold onto something until you know that your balance is fine.  Move your legs often if you need to stand in one place for a long time. Tighten and relax your muscles in your legs while you are standing.  Do not drive or operate heavy machinery if you feel dizzy.  Avoid bending down if you feel dizzy. Place items in your home so that they are easy for you to reach without leaning over. Lifestyle  Do not use any tobacco  products, including cigarettes, chewing tobacco, or electronic cigarettes. If you need help quitting, ask your health care provider.  Try to reduce your stress level, such as with yoga or meditation. Talk with your health care provider if you need help. General Instructions  Watch your dizziness for any changes.  Take medicines only as directed by your health care provider. Talk with your health care provider if you think that your dizziness is caused by a medicine that you are taking.  Tell a friend or a family member that you are feeling dizzy. If he or she notices any changes in your behavior, have this person call your health care provider.  Keep all follow-up visits as directed by your health care provider. This is important. SEEK MEDICAL CARE IF:  Your dizziness does not go away.  Your dizziness or light-headedness gets worse.  You feel nauseous.  You have reduced hearing.  You have new symptoms.  You are unsteady on your feet or you feel like the room is spinning. SEEK IMMEDIATE MEDICAL CARE IF:  You vomit or have diarrhea and are unable to eat or drink anything.  You have problems talking, walking, swallowing, or using your arms, hands, or legs.  You feel generally weak.  You are not thinking clearly or you have trouble forming sentences. It may take a friend or family member to notice this.  You have chest pain, abdominal pain, shortness of breath, or sweating.  Your vision changes.  You notice  any bleeding.  You have a headache.  You have neck pain or a stiff neck.  You have a fever.   This information is not intended to replace advice given to you by your health care provider. Make sure you discuss any questions you have with your health care provider.   Document Released: 03/23/2001 Document Revised: 02/11/2015 Document Reviewed: 09/23/2014 Elsevier Interactive Patient Education [employer].

## 2016-03-15 NOTE — Progress Notes (Signed)
Subjective:    Patient ID: Laura Poole, female    DOB: 07-Jan-1951, 65 y.o.   MRN: [id number]   Laura Poole is a 65 y.o. female who presents today for an acute visit.    HPI Comments: Patient stated that it started this morning on her way to work. First episode of being lightheaded. Room is not spinning, no URI symptoms, hearing loss, tinnitus.  Her BP was taken at work and it was 98/61. Patient states that it usually runs high for her. She has eaten since then and has also hydrated. Takes labetolol daily. Denies exertional chest pain or pressure, numbness or tingling radiating to left arm or jaw, palpitations, dizziness, frequent headaches, changes in vision, or shortness of breath.   Patient states that she doesn't usually take the labetelol twice per day but has been taking differently for the last two days. pt usually takes it at night but has taken it in the morning for the last two mornings and with other medications.   History of carotid stenosis and mitral regurgitation. Follows with cardiology.   Past Medical History  Diagnosis Date  . History of breast cancer 2006  . GERD (gastroesophageal reflux disease)   . History of colonic polyps   . HTN (hypertension)   . HLD (hyperlipidemia)   . H/O: hysterectomy   . Chest pain    Allergies: Review of patient's allergies indicates no known allergies. Current Outpatient Prescriptions on File Prior to Visit  Medication Sig Dispense Refill  . amLODipine (NORVASC) 10 MG tablet Take 1 tablet (10 mg total) by mouth daily. 90 tablet 3  . aspirin 81 MG tablet Take 1 tablet (81 mg total) by mouth daily. 30 tablet 6  . cetirizine (ZYRTEC) 10 MG tablet Take 10 mg by mouth daily.    . Cholecalciferol (VITAMIN D3) 1000 UNITS CAPS Take 1 capsule by mouth daily.     . fluticasone (FLONASE) 50 MCG/ACT nasal spray Place 1 spray into both nostrils 2 (two) times daily as needed for allergies or rhinitis. 16 g 2  . labetalol (NORMODYNE) 300 MG  tablet Take 1 tablet (300 mg total) by mouth 2 (two) times daily. 180 tablet 3  . magnesium oxide (MAG-OX) 400 MG tablet 1 tablet by mouth at bedtime    . mirabegron ER (MYRBETRIQ) 25 MG TB24 tablet Take 25 mg by mouth daily.    . pantoprazole (PROTONIX) 40 MG tablet Take 1 tablet (40 mg total) by mouth 2 (two) times daily before a meal. 180 tablet 3  . PAZEO 0.7 % SOLN   3  . rosuvastatin (CRESTOR) 10 MG tablet Take 1 tablet (10 mg total) by mouth daily. 30 tablet 3  . spironolactone (ALDACTONE) 25 MG tablet TAKE 1 TABLET BY MOUTH ONCE DAILY IN PLACE OF GENERIC DYAZIDE AND POTASSIUM SUPPLEMENT 90 tablet 3  . sulfamethoxazole-trimethoprim (BACTRIM DS,SEPTRA DS) 800-160 MG tablet Take 1 tablet by mouth 2 (two) times daily. 20 tablet 0  . tiZANidine (ZANAFLEX) 4 MG tablet Take 1 tablet (4 mg total) by mouth 3 (three) times daily. 90 tablet 2  . zolpidem (AMBIEN) 10 MG tablet TAKE 1 TABLET BY MOUTH EACH NIGHT AT BEDTIME AS NEEDED 30 tablet 1   No current facility-administered medications on file prior to visit.    Social History  Substance Use Topics  . Smoking status: Never Smoker   . Smokeless tobacco: Never Used  . Alcohol Use: 1.2 oz/week    2 Cans of beer per week  Comment: rarely    Review of Systems  Constitutional: Negative for fever and chills.  HENT: Negative for congestion and sinus pressure.   Respiratory: Negative for cough.   Cardiovascular: Negative for chest pain, palpitations and leg swelling.  Gastrointestinal: Negative for nausea, vomiting, diarrhea and abdominal distention.  Neurological: Positive for dizziness and light-headedness (very rare, couple times a year has mild HA). Negative for syncope and headaches.  Psychiatric/Behavioral: Negative for confusion.      Objective:    BP 122/88 mmHg  Pulse 86  Temp(Src) 98.2 F (36.8 C) (Oral)  Ht 5' 7.5" (1.715 m)  Wt 183 lb 12 oz (83.348 kg)  BMI 28.34 kg/m2  SpO2 96%   Physical Exam  Constitutional: She  appears well-developed and well-nourished.  HENT:  Head: Normocephalic and atraumatic.  Right Ear: Hearing, tympanic membrane, external ear and ear canal normal. No swelling or tenderness. Tympanic membrane is not erythematous and not bulging. No middle ear effusion.  Left Ear: Tympanic membrane, external ear and ear canal normal. No swelling or tenderness. Tympanic membrane is not erythematous and not bulging.  No middle ear effusion.  Nose: Nose normal. No rhinorrhea. Right sinus exhibits no maxillary sinus tenderness and no frontal sinus tenderness. Left sinus exhibits no maxillary sinus tenderness and no frontal sinus tenderness.  Mouth/Throat: Uvula is midline, oropharynx is clear and moist and mucous membranes are normal. No posterior oropharyngeal edema or posterior oropharyngeal erythema.  Eyes: Conjunctivae, EOM and lids are normal. Pupils are equal, round, and reactive to light. Lids are everted and swept, no foreign bodies found.  Normal fundus bilaterally.   Cardiovascular: Normal rate, regular rhythm, normal heart sounds and normal pulses.   No carotid bruits.  Pulmonary/Chest: Effort normal and breath sounds normal. She has no wheezes. She has no rhonchi. She has no rales.  Lymphadenopathy:       Head (right side): No submental, no submandibular, no tonsillar, no preauricular, no posterior auricular and no occipital adenopathy present.       Head (left side): No submental, no submandibular, no tonsillar, no preauricular, no posterior auricular and no occipital adenopathy present.    She has no cervical adenopathy.       Right cervical: No superficial cervical, no deep cervical and no posterior cervical adenopathy present.      Left cervical: No superficial cervical, no deep cervical and no posterior cervical adenopathy present.  Neurological: She is alert. She has normal strength. No cranial nerve deficit or sensory deficit. She displays a negative Romberg sign.  Reflex Scores:       Bicep reflexes are 2+ on the right side and 2+ on the left side.      Patellar reflexes are 2+ on the right side and 2+ on the left side. Grip equal and strong bilateral upper extremities. Gait strong and steady. Able to perform  finger-to-nose without difficulty.  Dix hall pike maneuver did not elicit dizziness. No nystagmus noted. Patient denied nausea or vertigo during maneuver.    Skin: Skin is warm and dry.  Psychiatric: She has a normal mood and affect. Her speech is normal and behavior is normal. Thought content normal.  Vitals reviewed.      Orthostatic VS for the past 24 hrs:  BP- Lying Pulse- Lying BP- Sitting Pulse- Sitting BP- Standing at 0 minutes Pulse- Standing at 0 minutes  03/15/16 0927 130/80 mmHg 86 118/90 mmHg 89 (!) 122/100 mmHg 93      Assessment & Plan:  1. Dizziness and giddiness At this time, the etiology of dizziness episode today is nonspecific at this time. I'm very reassured by normal neurologic and cardiac exam today. No carotid bruits. Patient has no worrisome symptoms including chest pain, shortness of breath, palpitations. She is not orthostatic. Normotensive in our office. EKG is normal which is very reassuring. I have some concern that due to  history of carotid stenosis and mitral regurgitation that these may be the cause. However echocardiogram was November of last year, and have low suspicion that the mild mitral regurgitation would hvae changed substantially in the short period of time. Carotid Doppler was in 2012 however as I discussed with patient, I would suspect patient would be having frequent, recurrent episodes of dizziness if the carotid stenosis had acutely worsened.    I'm considering labetalol to have exacerbated/caused episode of dizziness especially as it occurred first thing in the morning; perhaps patient had been mildly dehydrated overnight. We agreed to monitor symptoms closely and if another episode of dizziness recurred, patient will  let her office know and will be seen promptly. Also placed a referral back to cardiology she can have a follow-up appointment with them as well.   I am having Ms. Savage maintain her aspirin, Vitamin D3, cetirizine, pantoprazole, tiZANidine, magnesium oxide, spironolactone, labetalol, amLODipine, mirabegron ER, rosuvastatin, PAZEO, sulfamethoxazole-trimethoprim, fluticasone, and zolpidem.   No orders of the defined types were placed in this encounter.     Start medications as prescribed and explained to patient on After Visit Summary ( AVS). Risks, benefits, and alternatives of the medications and treatment plan prescribed today were discussed, and patient expressed understanding.   Education regarding symptom management and diagnosis given to patient.   Follow-up:Plan follow-up and return precautions given if any worsening symptoms or change in condition.   Continue to follow with Unice Cobble, MD for routine health maintenance.   [name] and I agreed with plan.   Mable Paris, FNP

## 2016-03-19 ENCOUNTER — Other Ambulatory Visit: Payer: Self-pay | Admitting: *Deleted

## 2016-03-22 MED ORDER — ZOLPIDEM TARTRATE 10 MG PO TABS: 10.0000 mg | ORAL_TABLET | Freq: Every evening | ORAL | Status: DC | PRN

## 2016-03-22 NOTE — Telephone Encounter (Signed)
Dr Sheppard Plumber this okay to refill?

## 2016-03-29 ENCOUNTER — Other Ambulatory Visit: Payer: Self-pay | Admitting: Cardiology

## 2016-03-30 NOTE — Telephone Encounter (Signed)
zolpidem (AMBIEN) 10 MG tablet  Medication   Date: 03/22/2016  Department: [facility]  Ordering/Authorizing: Larey Dresser, MD      Order Providers    Prescribing Provider Encounter Provider   Larey Dresser, MD Juventino Slovak, CMA    Medication Detail      Disp Refills Start End     zolpidem (AMBIEN) 10 MG tablet 30 tablet 1 03/22/2016     Sig - Route: Take 1 tablet (10 mg total) by mouth at bedtime as needed for sleep. - Oral    Class: [place], [place]     Will route to ann lankford

## 2016-04-02 ENCOUNTER — Other Ambulatory Visit: Payer: Self-pay | Admitting: Cardiology

## 2016-04-02 MED ORDER — ZOLPIDEM TARTRATE 10 MG PO TABS: 10.0000 mg | ORAL_TABLET | Freq: Every evening | ORAL | Status: DC | PRN

## 2016-04-02 MED FILL — ZOLPIDEM TARTRATE 10 MG TAB: 10 | 30 days supply | Qty: 30 | Fill #0

## 2016-04-07 ENCOUNTER — Other Ambulatory Visit: Payer: Self-pay | Admitting: Gastroenterology

## 2016-04-07 ENCOUNTER — Other Ambulatory Visit: Payer: Self-pay | Admitting: Internal Medicine

## 2016-04-07 MED FILL — MYRBETRIQ ER 25 MG TABLET: 25 | 30 days supply | Qty: 30 | Fill #5

## 2016-04-08 NOTE — Telephone Encounter (Signed)
Spoke with pharmacy and was informed that they have a current rx from 04/02/16 and the patient has already picked it up.

## 2016-04-11 NOTE — Progress Notes (Signed)
Cardiology Office Note   Date:  04/12/2016   ID:  Laura Poole, DOB [birth year], MRN [id number]  PCP:  Dr. Linna Darner  Cardiologist:  Dr. Aundra Dubin   Chief Complaint  Patient presents with  . Follow-up    seen for Dr. Aundra Dubin  . Dizziness    light headedness      History of Present Illness: Laura Poole is a 65 y.o. female who presents for cardiology followup. Her PCP is Dr. Unice Cobble. She has PMH of breast cancer and carotid stenosis. She was previously evaluated for nonexertional chest discomfort. Last Myoview obtained in April 2011 showed no evidence for ischemia. She also had a cardiac catheterization in April 2013 which showed no coronary disease. She had a carotid artery ultrasound in 2015, which showed elevated left ICA is velocity likely due to tortuosity. Her last echocardiogram obtained on 09/10/2015 showed EF 0000000, grade 1 diastolic dysfunction, moderate LVH, mild MR. Note previous echocardiogram in 2012 mentioned moderate MR. She has chronic dyspnea on exertion. She was last seen in the office on 08/04/2015, at which time she complained of leg cramps with ambulation. Outpatient arterial Doppler was ordered which showed normal blood flow without obvious lower extremity stenosis.  The last time she was seen by her PCP was in January 2017 at which time she had a tender erythematous lesion over the right shin. Per PCPs note, she had a history of MRSA and had abscess incised and drained. She was treated for potential cellulitis with 10 day course of Bactrim. She was recently evaluated in June 2017 for episode of lightheadedness. Apparently her blood pressure was low. It was suspected that her beta blocker was still culprit. She has been referred back to cardiology for further evaluation.  According to the patient, she did have one very bad episode of dizziness during work. Since then, her dizziness has been improving. She does not understand why her blood pressure has been  running on the lower side since she has a history of higher blood pressure. She has been eating well and trying to be healthier which is good. Yesterday, she had a prolonged episode of chest discomfort in the setting of emotional disturbance. She says she felt like burping and symptom is similar to GERD but lasted longer until 1 AM this morning. I will obtain an EKG today just to make sure there is no acute issues yesterday. As far as her episodic dizziness and weakness, we will check a TSH and a CBC. We will repeat carotid ultrasound. I have instructed her to check her blood pressure twice daily, if her blood pressure does drop significantly we will start decreasing amlodipine. She is also concerned about her overactive bladder, she says her Myrbetriq has not been helping. She found herself wetting 2 pads per night, considering spironolactone is a very weak diuretic, I doubt this is the cause of her urinary nocturnal leakage. I recommended her to use follow-up with her OB/GYN for further workup.   Past Medical History  Diagnosis Date  . History of breast cancer 2006  . GERD (gastroesophageal reflux disease)   . History of colonic polyps   . HTN (hypertension)   . HLD (hyperlipidemia)   . H/O: hysterectomy   . Chest pain     Past Surgical History  Procedure Laterality Date  . Breast lumpectomy  2006  . Colonoscopy w/ polypectomy  2004, 2009    Dr Watt Climes  . Tubal ligation    . Abdominal hysterectomy  Current Outpatient Prescriptions  Medication Sig Dispense Refill  . acetaminophen (TYLENOL) 500 MG tablet Take 500 mg by mouth every 6 (six) hours as needed for mild pain or headache.    [name] amLODipine (NORVASC) 10 MG tablet Take 1 tablet (10 mg total) by mouth daily. 90 tablet 3  . aspirin 81 MG tablet Take 1 tablet (81 mg total) by mouth daily. 30 tablet 6  . cetirizine (ZYRTEC) 10 MG tablet Take 10 mg by mouth daily.    . Cholecalciferol (VITAMIN D3) 1000 UNITS CAPS Take 1 capsule by  mouth daily.     . Coenzyme Q10 (CO Q 10) 100 MG CAPS Take 1 capsule by mouth every morning.    . diphenhydrAMINE (BENADRYL) 25 mg capsule Take 25 mg by mouth every 6 (six) hours as needed for allergies.    . fluticasone (FLONASE) 50 MCG/ACT nasal spray Place 1 spray into both nostrils 2 (two) times daily as needed for allergies or rhinitis. 16 g 2  . labetalol (NORMODYNE) 300 MG tablet Take 1 tablet (300 mg total) by mouth 2 (two) times daily. 180 tablet 3  . magnesium oxide (MAG-OX) 400 MG tablet 1 tablet by mouth at bedtime    . mirabegron ER (MYRBETRIQ) 25 MG TB24 tablet Take 25 mg by mouth daily.    . pantoprazole (PROTONIX) 40 MG tablet Take 1 tablet (40 mg total) by mouth 2 (two) times daily before a meal. 180 tablet 3  . rosuvastatin (CRESTOR) 10 MG tablet Take 1 tablet (10 mg total) by mouth daily. 30 tablet 3  . spironolactone (ALDACTONE) 25 MG tablet TAKE 1 TABLET BY MOUTH ONCE DAILY IN PLACE OF GENERIC DYAZIDE AND POTASSIUM SUPPLEMENT 90 tablet 3  . tiZANidine (ZANAFLEX) 4 MG tablet Take 1 tablet (4 mg total) by mouth 3 (three) times daily. 90 tablet 2  . zolpidem (AMBIEN) 10 MG tablet Take 1 tablet (10 mg total) by mouth at bedtime as needed for sleep. 30 tablet 1   No current facility-administered medications for this visit.    Allergies:   Review of patient's allergies indicates no known allergies.    Social History:  The patient  reports that she has never smoked. She has never used smokeless tobacco. She reports that she drinks about 1.2 oz of alcohol per week. She reports that she does not use illicit drugs.   Family History:  The patient's family history includes Aneurysm in her brother; Asthma in her other; Coronary artery disease in her mother; Heart attack (age of onset: 48) in her father; Hyperlipidemia in her other; Hypertension in her mother; Sudden death in her other. There is no history of Colon cancer.    ROS:  Please see the history of present illness.    Otherwise, review of systems are positive for dizziness, chest discomfort.   All other systems are reviewed and negative.    PHYSICAL EXAM: VS:  BP 124/84 mmHg  Pulse 74  Ht 5' 7.5" (1.715 m)  Wt 179 lb 1.9 oz (81.248 kg)  BMI 27.62 kg/m2  SpO2 99% , BMI Body mass index is 27.62 kg/(m^2). GEN: Well nourished, well developed, in no acute distress HEENT: normal Neck: no JVD, carotid bruits, or masses Cardiac: RRR; no murmurs, rubs, or gallops,no edema  Respiratory:  clear to auscultation bilaterally, normal work of breathing GI: soft, nontender, nondistended, + BS MS: no deformity or atrophy Skin: warm and dry, no rash Neuro:  Strength and sensation are intact Psych: euthymic mood, full  affect   EKG:  EKG is ordered today. The ekg ordered today demonstrates NSR with new R wave progression in anterior leads   Recent Labs: 05/16/2015: TSH 0.49 05/19/2015: Hemoglobin 12.7; Platelets 308.0 08/04/2015: Brain Natriuretic Peptide 12.3; BUN 15; Creat 0.98; Magnesium 2.0; Potassium 3.9; Sodium 138 11/04/2015: ALT 19    Lipid Panel    Component Value Date/Time   CHOL 147 11/04/2015 0738   TRIG 80 11/04/2015 0738   HDL 65 11/04/2015 0738   CHOLHDL 2.3 11/04/2015 0738   VLDL 16 11/04/2015 0738   LDLCALC 66 11/04/2015 0738      Wt Readings from Last 3 Encounters:  04/12/16 179 lb 1.9 oz (81.248 kg)  03/15/16 183 lb 12 oz (83.348 kg)  11/06/15 184 lb (83.462 kg)      Other studies Reviewed: Additional studies/ records that were reviewed today include:   Cath 01/28/2012 Procedural Findings: Hemodynamics: AO 101/61 LV 100/16  Coronary angiography: Coronary dominance: right  Left mainstem: No angiographic CAD.   Left anterior descending (LAD): No angiographic CAD.   Left circumflex (LCx): There was a moderate ramus. No angiographic CAD.   Right coronary artery (RCA): No angiographic CAD.   Left ventriculography: Left ventricular systolic function is normal, LVEF is  estimated at 60%.  Final Conclusions: No angiographic CAD, normal LV systolic function.   Recommendations: Suspect noncardiac chest pain. Would continue workup of GERD.    06/29/2014    Echo 09/10/2015 LV EF: 55% - 60%  ------------------------------------------------------------------- Indications: (I34.0).  ------------------------------------------------------------------- History: PMH: Acquired from the patient and from the patient&'s chart. Dyspnea. Mitral regurgitation. Risk factors: Hypertension. Dyslipidemia.  ------------------------------------------------------------------- Study Conclusions  - Left ventricle: The cavity size was normal. Wall thickness was  increased in a pattern of moderate LVH. Systolic function was  normal. The estimated ejection fraction was in the range of 55%  to 60%. Wall motion was normal; there were no regional wall  motion abnormalities. Doppler parameters are consistent with  abnormal left ventricular relaxation (grade 1 diastolic  dysfunction). Doppler parameters are consistent with elevated  ventricular end-diastolic filling pressure. - Aortic valve: calcified non coronary cusp. - Mitral valve: There was mild regurgitation. - Left atrium: The atrium was mildly dilated. - Atrial septum: No defect or patent foramen ovale was identified.   ABI 09/10/2015    Review of the above records demonstrates:   Patient was questionable history of carotid stenosis present 2 PCPs office recently for evaluation of dizziness.    ASSESSMENT AND PLAN:  1. Dizziness: improving, describe 1 really bad episode. Will repeat carotid U/S, note previous carotid ultrasound shows increased flow velocity in the left ICA likely due to tortuosity of the blood vessel but not significant blockage.   2. Chest pain: says it is like her usual GERD, but lasted longer, resolved around 1 AM this morning. Felt she needed to burp but  couldn't. EKG showed new R wave progression compare to a month ago which may be nonspecific. Will check trop today  - LHC in 01/2012 shows normal coronaries.   3. Night time urinary incontinance: spironolactone was for her potassium and to replace previous diuretic, it is a fairly week dieretic, doubt spironolactone alone is responsible for her urinary incontinence, recommend followup with her OB/GYN  4. Skin lesion in R scalp: unclear significance, will defer to PCP  5. HLD: on crestor 10mg  daily  6. HTN: on amlodipine 10mg  daily, labetalol 300mg  BID, spironolacton 25mg  daily  7. H/o leg cramps: normal LE U/S  in 08/2015. On mag oxide  8. Mild MR: noted to have moderate MR on echo 2012, repeat echo in 08/2015 shows mild MR     Current medicines are reviewed at length with the patient today.  The patient does not have concerns regarding medicines.  The following changes have been made:  no change  Labs/ tests ordered today include:   Orders Placed This Encounter  Procedures  . CBC w/Diff  . TSH  . Troponin I  . EKG 12-Lead     Disposition:   FU with Dr. Aundra Dubin in 3 months  Signed, Almyra Deforest, [place]  04/12/2016 10:26 AM    [address], [place], [place]  [address] Phone: [phone]; Fax: [phone]

## 2016-04-12 ENCOUNTER — Ambulatory Visit (INDEPENDENT_AMBULATORY_CARE_PROVIDER_SITE_OTHER): Payer: 59 | Admitting: Physician Assistant

## 2016-04-12 ENCOUNTER — Encounter: Payer: Self-pay | Admitting: Physician Assistant

## 2016-04-12 ENCOUNTER — Telehealth: Payer: Self-pay | Admitting: Cardiology

## 2016-04-12 VITALS — BP 124/84 | HR 74 | Ht 67.5 in | Wt 179.1 lb

## 2016-04-12 DIAGNOSIS — R531 Weakness: Secondary | ICD-10-CM

## 2016-04-12 DIAGNOSIS — R42 Dizziness and giddiness: Secondary | ICD-10-CM

## 2016-04-12 DIAGNOSIS — R0789 Other chest pain: Secondary | ICD-10-CM | POA: Diagnosis not present

## 2016-04-12 DIAGNOSIS — I6522 Occlusion and stenosis of left carotid artery: Secondary | ICD-10-CM | POA: Diagnosis not present

## 2016-04-12 DIAGNOSIS — E785 Hyperlipidemia, unspecified: Secondary | ICD-10-CM

## 2016-04-12 LAB — CBC WITH DIFFERENTIAL/PLATELET
BASOS PCT: 1 %
Basophils Absolute: 62 cells/uL (ref 0–200)
EOS ABS: 186 {cells}/uL (ref 15–500)
Eosinophils Relative: 3 %
HCT: 38.2 % (ref 35.0–45.0)
HEMOGLOBIN: 12.2 g/dL (ref 11.7–15.5)
LYMPHS ABS: 2046 {cells}/uL (ref 850–3900)
Lymphocytes Relative: 33 %
MCH: 28.9 pg (ref 27.0–33.0)
MCHC: 31.9 g/dL — ABNORMAL LOW (ref 32.0–36.0)
MCV: 90.5 fL (ref 80.0–100.0)
MONO ABS: 496 {cells}/uL (ref 200–950)
MONOS PCT: 8 %
MPV: 8.9 fL (ref 7.5–12.5)
NEUTROS ABS: 3410 {cells}/uL (ref 1500–7800)
Neutrophils Relative %: 55 %
PLATELETS: 339 10*3/uL (ref 140–400)
RBC: 4.22 MIL/uL (ref 3.80–5.10)
RDW: 14.8 % (ref 11.0–15.0)
WBC: 6.2 10*3/uL (ref 3.8–10.8)

## 2016-04-12 LAB — TROPONIN I: Troponin I: <0.03 ng/mL (ref <0.03)

## 2016-04-12 LAB — TSH: TSH: 1.78 m[IU]/L

## 2016-04-12 NOTE — Patient Instructions (Addendum)
Medication Instructions:  Your physician recommends that you continue on your current medications as directed. Please refer to the Current Medication list given to you today.   Labwork: Troponin, Tsh and Cbc  Testing/Procedures: Your physician has requested that you have a carotid duplex. This test is an ultrasound of the carotid arteries in your neck. It looks at blood flow through these arteries that supply the brain with blood. Allow one hour for this exam. There are no restrictions or special instructions.   Follow-Up: Your physician recommends that you schedule a follow-up appointment in: 3 months with Dr.McLean   Any Other Special Instructions Will Be Listed Below (If Applicable). Measure your BP twice daily, first time should be 2 hours after your morning medications, second time around 6 pm.    If you need a refill on your cardiac medications before your next appointment, please call your pharmacy.

## 2016-04-12 NOTE — Telephone Encounter (Signed)
Pt states she was a little lightheaded earlier today-BP readings since office visit today when she felt lightheaded 103/70, 109/72, 111/73, 118/74.  Pt states a few weeks ago at work she got lightheaded, BP 70/65, recheck higher, not sure of number. Pt states she saw her PCP the next day, SBP 120. Pt states she is concerned that her BP is running too low at times associated with lightheadedness. Pt states she feels her medications may need to be adjusted.  Pt advised I will forward to [facility] for review.

## 2016-04-12 NOTE — Telephone Encounter (Signed)
Pt c/o BP issue: STAT if pt c/o blurred vision, one-sided weakness or slurred speech  1. What are your last 5 BP readings? 103/70  109/72  111/73  2. Are you having any other symptoms (ex. Dizziness, headache, blurred vision, passed out)? dizziness 3. What is your BP issue? low

## 2016-04-14 NOTE — Telephone Encounter (Signed)
Pt states Laura Poole called and spoke with her Monday evening. Pt states she is feeling better today. Pt states she is going to keep a BP log and call with BP readings in 10-14 days.

## 2016-04-15 ENCOUNTER — Ambulatory Visit (HOSPITAL_COMMUNITY): Admission: RE | Admit: 2016-04-15 | Payer: 59 | Source: Ambulatory Visit

## 2016-04-15 ENCOUNTER — Telehealth: Payer: Self-pay | Admitting: Cardiology

## 2016-04-15 ENCOUNTER — Other Ambulatory Visit: Payer: Self-pay

## 2016-04-15 DIAGNOSIS — I6529 Occlusion and stenosis of unspecified carotid artery: Secondary | ICD-10-CM

## 2016-04-15 DIAGNOSIS — I10 Essential (primary) hypertension: Secondary | ICD-10-CM

## 2016-04-15 DIAGNOSIS — I739 Peripheral vascular disease, unspecified: Secondary | ICD-10-CM

## 2016-04-15 DIAGNOSIS — I6523 Occlusion and stenosis of bilateral carotid arteries: Secondary | ICD-10-CM

## 2016-04-15 MED ORDER — AMLODIPINE BESYLATE 10 MG PO TABS: 10.0000 mg | ORAL_TABLET | Freq: Every day | ORAL | Status: DC

## 2016-04-15 MED ORDER — SPIRONOLACTONE 25 MG PO TABS: ORAL_TABLET | ORAL | Status: DC

## 2016-04-15 MED ORDER — ROSUVASTATIN CALCIUM 10 MG PO TABS: 10.0000 mg | ORAL_TABLET | Freq: Every day | ORAL | Status: DC

## 2016-04-15 MED ORDER — LABETALOL HCL 300 MG PO TABS: 300.0000 mg | ORAL_TABLET | Freq: Two times a day (BID) | ORAL | Status: DC

## 2016-04-15 MED ORDER — PANTOPRAZOLE SODIUM 40 MG PO TBEC: 40.0000 mg | DELAYED_RELEASE_TABLET | Freq: Two times a day (BID) | ORAL | Status: DC

## 2016-04-15 MED FILL — AMLODIPINE BESYLATE 10 MG T: 10 | 90 days supply | Qty: 90 | Fill #0 | Status: TO

## 2016-04-15 MED FILL — SPIRONOLACTONE 25 MG TABLET: 25 | 90 days supply | Qty: 90 | Fill #0

## 2016-04-15 MED FILL — LABETALOL HCL 300 MG TABLET: 300 | 90 days supply | Qty: 180 | Fill #0

## 2016-04-15 MED FILL — PANTOPRAZOLE SOD DR 40 MG T: 40 | 60 days supply | Qty: 120 | Fill #0

## 2016-04-15 MED FILL — ROSUVASTATIN CALCIUM 10 MG: 10 | 30 days supply | Qty: 30 | Fill #0

## 2016-04-15 NOTE — Telephone Encounter (Signed)
Pt advised, verbalized understanding, will call in 10-14 days with BP readings.

## 2016-04-15 NOTE — Telephone Encounter (Signed)
Pt states she took labetalol, spironolactone and aspirin last night before going to church. Pt states she usually takes amlodipine in the evening but did not take it last night. Pt states while at church she felt very sleepy and warm, pt states she had to jerk herself awake. Pt states she stood up to go to bathroom for cold water to splash on her face and was dizzy when she stood up. Pt states she made it to bathroom, splashed cold water on her face and felt a little better. Pt states her BP was 107/69. Pt states she left church, went home and rested.  Pt states she has a mild headache this morning, denies any other symptoms. Pt states her BP about an hour ago was 149/106, heart rate 69. Pt states she has not taken any medication this morning, usually she would take labetalol in the AM.  Pt advised I will forward to Almyra Deforest, [place] for review.

## 2016-04-15 NOTE — Telephone Encounter (Signed)
I would recommend she check her BP twice a day, 1st check should be 2 hours after morning med, 2nd check at [facility] BP journal to next followup, if her BP tend to be low in afternoon, we may need to cut the amlodipine down to 5mg  daily.

## 2016-04-15 NOTE — Telephone Encounter (Signed)
Laura Poole is calling because on last evening when she went to tchurch , her blood pressure dropped to 107/69 . Before going to church she took a  Labetalol , Baby Aspirin, Stironolactone. She started feeling light headed , sweating and thought she was going to pass out . This morning she took her bp and it is 149/106 . Please call

## 2016-04-23 ENCOUNTER — Ambulatory Visit (HOSPITAL_COMMUNITY)
Admission: RE | Admit: 2016-04-23 | Discharge: 2016-04-23 | Disposition: A | Discharge status: Home / Self Care | Payer: 59 | Source: Ambulatory Visit | Attending: Cardiovascular Disease | Admitting: Cardiovascular Disease

## 2016-04-23 DIAGNOSIS — R42 Dizziness and giddiness: Secondary | ICD-10-CM

## 2016-04-23 DIAGNOSIS — K219 Gastro-esophageal reflux disease without esophagitis: Secondary | ICD-10-CM | POA: Insufficient documentation

## 2016-04-23 DIAGNOSIS — I6522 Occlusion and stenosis of left carotid artery: Secondary | ICD-10-CM

## 2016-04-23 DIAGNOSIS — I10 Essential (primary) hypertension: Secondary | ICD-10-CM | POA: Insufficient documentation

## 2016-04-23 DIAGNOSIS — R531 Weakness: Secondary | ICD-10-CM

## 2016-04-23 DIAGNOSIS — E785 Hyperlipidemia, unspecified: Secondary | ICD-10-CM | POA: Insufficient documentation

## 2016-05-17 ENCOUNTER — Ambulatory Visit (INDEPENDENT_AMBULATORY_CARE_PROVIDER_SITE_OTHER): Payer: 59 | Admitting: Family

## 2016-05-17 ENCOUNTER — Encounter: Payer: Self-pay | Admitting: Family

## 2016-05-17 DIAGNOSIS — Z0000 Encounter for general adult medical examination without abnormal findings: Secondary | ICD-10-CM | POA: Diagnosis not present

## 2016-05-17 DIAGNOSIS — Z0001 Encounter for general adult medical examination with abnormal findings: Secondary | ICD-10-CM | POA: Insufficient documentation

## 2016-05-17 MED ORDER — ZOSTER VACCINE LIVE 19400 UNT/0.65ML ~~LOC~~ SUSR: 0.6500 mL | Freq: Once | SUBCUTANEOUS | 0 refills | Status: AC

## 2016-05-17 MED FILL — ZOSTAVAX VIAL: 19400 | 1 days supply | Qty: 1 | Fill #0

## 2016-05-17 NOTE — Progress Notes (Signed)
Subjective:    Patient ID: Laura Poole, female    DOB: 03-16-1951, 65 y.o.   MRN: [id number]  Chief Complaint  Patient presents with  . CPE    not fasting    HPI:  Laura Poole is a 65 y.o. female who presents today for an annual wellness visit.   1) Health Maintenance -   Diet - Averages about 2-3 meals per day consisting of a regular diet. Caffeine intake of about 3-4 cups per day  Exercise - No structured exercise    2) Preventative Exams / Immunizations:  Dental -- Up to date  Vision -- Up to date   Health Maintenance  Topic Date Due  . Hepatitis C Screening  1951-08-11  . HIV Screening  04/11/1968  . ZOSTAVAX  04/11/2013  . INFLUENZA VACCINE  05/11/2016  . MAMMOGRAM  07/07/2017  . COLONOSCOPY  12/13/2018  . TETANUS/TDAP  12/04/2022    Immunization History  Administered Date(s) Administered  . Influenza Split 08/09/2012  . Influenza-Unspecified 07/11/2013  . Tetanus 12/04/2012   No Known Allergies   Outpatient Medications Prior to Visit  Medication Sig Dispense Refill  . acetaminophen (TYLENOL) 500 MG tablet Take 500 mg by mouth every 6 (six) hours as needed for mild pain or headache.    [name] amLODipine (NORVASC) 10 MG tablet Take 1 tablet (10 mg total) by mouth daily. 90 tablet 3  . aspirin 81 MG tablet Take 1 tablet (81 mg total) by mouth daily. 30 tablet 6  . cetirizine (ZYRTEC) 10 MG tablet Take 10 mg by mouth daily.    . Cholecalciferol (VITAMIN D3) 1000 UNITS CAPS Take 1 capsule by mouth daily.     . Coenzyme Q10 (CO Q 10) 100 MG CAPS Take 1 capsule by mouth every morning.    . diphenhydrAMINE (BENADRYL) 25 mg capsule Take 25 mg by mouth every 6 (six) hours as needed for allergies.    . fluticasone (FLONASE) 50 MCG/ACT nasal spray Place 1 spray into both nostrils 2 (two) times daily as needed for allergies or rhinitis. 16 g 2  . labetalol (NORMODYNE) 300 MG tablet Take 1 tablet (300 mg total) by mouth 2 (two) times daily. 180 tablet 3  .  magnesium oxide (MAG-OX) 400 MG tablet 1 tablet by mouth at bedtime    . mirabegron ER (MYRBETRIQ) 25 MG TB24 tablet Take 25 mg by mouth daily.    . pantoprazole (PROTONIX) 40 MG tablet Take 1 tablet (40 mg total) by mouth 2 (two) times daily before a meal. 120 tablet 0  . rosuvastatin (CRESTOR) 10 MG tablet Take 1 tablet (10 mg total) by mouth daily. 30 tablet 3  . spironolactone (ALDACTONE) 25 MG tablet TAKE 1 TABLET BY MOUTH ONCE DAILY IN PLACE OF GENERIC DYAZIDE AND POTASSIUM SUPPLEMENT 90 tablet 3  . tiZANidine (ZANAFLEX) 4 MG tablet Take 1 tablet (4 mg total) by mouth 3 (three) times daily. 90 tablet 2  . zolpidem (AMBIEN) 10 MG tablet Take 1 tablet (10 mg total) by mouth at bedtime as needed for sleep. 30 tablet 1   No facility-administered medications prior to visit.      Past Medical History:  Diagnosis Date  . Chest pain   . GERD (gastroesophageal reflux disease)   . H/O: hysterectomy   . History of breast cancer 2006  . History of colonic polyps   . HLD (hyperlipidemia)   . HTN (hypertension)      Past Surgical History:  Procedure Laterality  Date  . ABDOMINAL HYSTERECTOMY    . BREAST LUMPECTOMY  2006  . COLONOSCOPY W/ POLYPECTOMY  2004, 2009   Dr Watt Climes  . TUBAL LIGATION       Family History  Problem Relation Age of Onset  . Heart attack Father 56  . Hypertension Mother   . Coronary artery disease Mother   . Aneurysm Brother   . Asthma Other     nephew  . Hyperlipidemia Other     nephew  . Sudden death Other     nephew  . Colon cancer Neg Hx      Social History   Social History  . Marital status: Single    Spouse name: N/A  . Number of children: N/A  . Years of education: N/A   Occupational History  . Not on file.   Social History Main Topics  . Smoking status: Never Smoker  . Smokeless tobacco: Never Used  . Alcohol use 1.2 oz/week    2 Cans of beer per week     Comment: rarely  . Drug use: No  . Sexual activity: Not on file   Other  Topics Concern  . Not on file   Social History Narrative  . No narrative on file     Review of Systems  Constitutional: Denies fever, chills, fatigue, or significant weight gain/loss. HENT: Head: Denies headache or neck pain Ears: Denies changes in hearing, ringing in ears, earache, drainage Nose: Denies discharge, stuffiness, itching, nosebleed, sinus pain Throat: Denies sore throat, hoarseness, dry mouth, sores, thrush Eyes: Denies loss/changes in vision, pain, redness, blurry/double vision, flashing lights Cardiovascular: Denies chest pain/discomfort, tightness, palpitations, shortness of breath with activity, difficulty lying down, swelling, sudden awakening with shortness of breath Respiratory: Denies shortness of breath, cough, sputum production, wheezing Gastrointestinal: Denies dysphasia, heartburn, change in appetite, nausea, change in bowel habits, rectal bleeding, constipation, diarrhea, yellow skin or eyes Genitourinary: Denies frequency, urgency, burning/pain, blood in urine, incontinence, change in urinary strength. Musculoskeletal: Denies muscle/joint pain, stiffness, back pain, redness or swelling of joints, trauma Skin: Denies rashes, lumps, itching, dryness, color changes, or hair/nail changes Neurological: Denies dizziness, fainting, seizures, weakness, numbness, tingling, tremor Psychiatric - Denies nervousness, stress, depression or memory loss Endocrine: Denies heat or cold intolerance, sweating, frequent urination, excessive thirst, changes in appetite Hematologic: Denies ease of bruising or bleeding     Objective:     BP 128/90 (BP Location: Left Arm, Patient Position: Sitting, Cuff Size: Normal)   Pulse 85   Temp 98.2 F (36.8 C) (Oral)   Resp 16   Ht 5' 7.5" (1.715 m)   Wt 179 lb (81.2 kg)   SpO2 97%   BMI 27.62 kg/m  Nursing note and vital signs reviewed.  Physical Exam  Constitutional: She is oriented to person, place, and time. She appears  well-developed and well-nourished.  HENT:  Head: Normocephalic.  Right Ear: Hearing, tympanic membrane, external ear and ear canal normal.  Left Ear: Hearing, tympanic membrane, external ear and ear canal normal.  Nose: Nose normal.  Mouth/Throat: Uvula is midline, oropharynx is clear and moist and mucous membranes are normal.  Eyes: Conjunctivae and EOM are normal. Pupils are equal, round, and reactive to light.  Neck: Neck supple. No JVD present. No tracheal deviation present. No thyromegaly present.  Cardiovascular: Normal rate, regular rhythm, normal heart sounds and intact distal pulses.   Pulmonary/Chest: Effort normal and breath sounds normal.  Abdominal: Soft. Bowel sounds are normal. She exhibits no distension  and no mass. There is no tenderness. There is no rebound and no guarding.  Musculoskeletal: Normal range of motion. She exhibits no edema or tenderness.  Lymphadenopathy:    She has no cervical adenopathy.  Neurological: She is alert and oriented to person, place, and time. She has normal reflexes. No cranial nerve deficit. She exhibits normal muscle tone. Coordination normal.  Skin: Skin is warm and dry.  Psychiatric: She has a normal mood and affect. Her behavior is normal. Judgment and thought content normal.       Assessment & Plan:   Problem List Items Addressed This Visit      Other   Routine general medical examination at a health care facility    1) Anticipatory Guidance: Discussed importance of wearing a seatbelt while driving and not texting while driving; changing batteries in smoke detector at least once annually; wearing suntan lotion when outside; eating a balanced and moderate diet; getting physical activity at least 30 minutes per day.  2) Immunizations / Screenings / Labs:  Zostavax sent to pharmacy. All other immunizations are up-to-date per recommendations. Declines hepatitis C screening, however we will obtain screen during next blood work. All other  screenings are up-to-date per recommendations. Blood work previously completed with no significant abnormalities.   Overall well exam with risk factors for cardiovascular disease including hypertension, hyperlipidemia, and overweight. Blood pressure and cholesterol appear to be well controlled with current regimens and no adverse side effects. Recommended weight loss of 5-10% of current body weight through nutrition and physical activity changes. Nutritional intake that is moderate, balance, and varied and low in saturated fats and processed/sugary foods and increasing physical activity to 30 minutes of mild to moderate level activity daily. Continue healthy lifestyle behaviors and choices. Follow-up prevention exam in 1 year. Follow-up office visit for chronic conditions in 3 months.         Relevant Medications   Zoster Vaccine Live, PF, (ZOSTAVAX) 91478 UNT/0.65ML injection    Other Visit Diagnoses   None.      I am having Ms. Legacy start on Zoster Vaccine Live (PF). I am also having her maintain her aspirin, Vitamin D3, cetirizine, tiZANidine, magnesium oxide, mirabegron ER, fluticasone, zolpidem, diphenhydrAMINE, Co Q 10, acetaminophen, pantoprazole, spironolactone, rosuvastatin, amLODipine, and labetalol.   Meds ordered this encounter  Medications  . Zoster Vaccine Live, PF, (ZOSTAVAX) 29562 UNT/0.65ML injection    Sig: Inject 19,400 Units into the skin once.    Dispense:  1 each    Refill:  0    Order Specific Question:   Supervising Provider    Answer:   Pricilla Holm A [id number]     Follow-up: No Follow-up on file.   Mauricio Po, FNP

## 2016-05-17 NOTE — Patient Instructions (Addendum)
Thank you for choosing [facility].  Summary/Instructions:  Your prescription(s) have been submitted to your pharmacy or been printed and provided for you. Please take as directed and contact our office if you believe you are having problem(s) with the medication(s) or have any questions.  Health Maintenance, Female Adopting a healthy lifestyle and getting preventive care can go a long way to promote health and wellness. Talk with your health care provider about what schedule of regular examinations is right for you. This is a good chance for you to check in with your provider about disease prevention and staying healthy. In between checkups, there are plenty of things you can do on your own. Experts have done a lot of research about which lifestyle changes and preventive measures are most likely to keep you healthy. Ask your health care provider for more information. WEIGHT AND DIET  Eat a healthy diet  Be sure to include plenty of vegetables, fruits, low-fat dairy products, and lean protein.  Do not eat a lot of foods high in solid fats, added sugars, or salt.  Get regular exercise. This is one of the most important things you can do for your health.  Most adults should exercise for at least 150 minutes each week. The exercise should increase your heart rate and make you sweat (moderate-intensity exercise).  Most adults should also do strengthening exercises at least twice a week. This is in addition to the moderate-intensity exercise.  Maintain a healthy weight  Body mass index (BMI) is a measurement that can be used to identify possible weight problems. It estimates body fat based on height and weight. Your health care provider can help determine your BMI and help you achieve or maintain a healthy weight.  For females 75 years of age and older:   A BMI below 18.5 is considered underweight.  A BMI of 18.5 to 24.9 is normal.  A BMI of 25 to 29.9 is considered  overweight.  A BMI of 30 and above is considered obese.  Watch levels of cholesterol and blood lipids  You should start having your blood tested for lipids and cholesterol at 63 years of age, then have this test every 5 years.  You may need to have your cholesterol levels checked more often if:  Your lipid or cholesterol levels are high.  You are older than 63 years of age.  You are at high risk for heart disease.  CANCER SCREENING   Lung Cancer  Lung cancer screening is recommended for adults 55-66 years old who are at high risk for lung cancer because of a history of smoking.  A yearly low-dose CT scan of the lungs is recommended for people who:  Currently smoke.  Have quit within the past 15 years.  Have at least a 30-pack-year history of smoking. A pack year is smoking an average of one pack of cigarettes a day for 1 year.  Yearly screening should continue until it has been 15 years since you quit.  Yearly screening should stop if you develop a health problem that would prevent you from having lung cancer treatment.  Breast Cancer  Practice breast self-awareness. This means understanding how your breasts normally appear and feel.  It also means doing regular breast self-exams. Let your health care provider know about any changes, no matter how small.  If you are in your 20s or 30s, you should have a clinical breast exam (CBE) by a health care provider every 1-3 years as part of  a regular health exam.  If you are 63 or older, have a CBE every year. Also consider having a breast X-ray (mammogram) every year.  If you have a family history of breast cancer, talk to your health care provider about genetic screening.  If you are at high risk for breast cancer, talk to your health care provider about having an MRI and a mammogram every year.  Breast cancer gene (BRCA) assessment is recommended for women who have family members with BRCA-related cancers. BRCA-related  cancers include:  Breast.  Ovarian.  Tubal.  Peritoneal cancers.  Results of the assessment will determine the need for genetic counseling and BRCA1 and BRCA2 testing. Cervical Cancer Your health care provider may recommend that you be screened regularly for cancer of the pelvic organs (ovaries, uterus, and vagina). This screening involves a pelvic examination, including checking for microscopic changes to the surface of your cervix (Pap test). You may be encouraged to have this screening done every 3 years, beginning at age 56.  For women ages 45-65, health care providers may recommend pelvic exams and Pap testing every 3 years, or they may recommend the Pap and pelvic exam, combined with testing for human papilloma virus (HPV), every 5 years. Some types of HPV increase your risk of cervical cancer. Testing for HPV may also be done on women of any age with unclear Pap test results.  Other health care providers may not recommend any screening for nonpregnant women who are considered low risk for pelvic cancer and who do not have symptoms. Ask your health care provider if a screening pelvic exam is right for you.  If you have had past treatment for cervical cancer or a condition that could lead to cancer, you need Pap tests and screening for cancer for at least 20 years after your treatment. If Pap tests have been discontinued, your risk factors (such as having a new sexual partner) need to be reassessed to determine if screening should resume. Some women have medical problems that increase the chance of getting cervical cancer. In these cases, your health care provider may recommend more frequent screening and Pap tests. Colorectal Cancer  This type of cancer can be detected and often prevented.  Routine colorectal cancer screening usually begins at 63 years of age and continues through 63 years of age.  Your health care provider may recommend screening at an earlier age if you have risk  factors for colon cancer.  Your health care provider may also recommend using home test kits to check for hidden blood in the stool.  A small camera at the end of a tube can be used to examine your colon directly (sigmoidoscopy or colonoscopy). This is done to check for the earliest forms of colorectal cancer.  Routine screening usually begins at age 6.  Direct examination of the colon should be repeated every 5-10 years through 63 years of age. However, you may need to be screened more often if early forms of precancerous polyps or small growths are found. Skin Cancer  Check your skin from head to toe regularly.  Tell your health care provider about any new moles or changes in moles, especially if there is a change in a mole's shape or color.  Also tell your health care provider if you have a mole that is larger than the size of a pencil eraser.  Always use sunscreen. Apply sunscreen liberally and repeatedly throughout the day.  Protect yourself by wearing long sleeves, pants, a wide-brimmed  hat, and sunglasses whenever you are outside. HEART DISEASE, DIABETES, AND HIGH BLOOD PRESSURE   High blood pressure causes heart disease and increases the risk of stroke. High blood pressure is more likely to develop in:  People who have blood pressure in the high end of the normal range (130-139/85-89 mm Hg).  People who are overweight or obese.  People who are African American.  If you are 78-23 years of age, have your blood pressure checked every 3-5 years. If you are 76 years of age or older, have your blood pressure checked every year. You should have your blood pressure measured twice--once when you are at a hospital or clinic, and once when you are not at a hospital or clinic. Record the average of the two measurements. To check your blood pressure when you are not at a hospital or clinic, you can use:  An automated blood pressure machine at a pharmacy.  A home blood pressure  monitor.  If you are between 68 years and 85 years old, ask your health care provider if you should take aspirin to prevent strokes.  Have regular diabetes screenings. This involves taking a blood sample to check your fasting blood sugar level.  If you are at a normal weight and have a low risk for diabetes, have this test once every three years after 63 years of age.  If you are overweight and have a high risk for diabetes, consider being tested at a younger age or more often. PREVENTING INFECTION  Hepatitis B  If you have a higher risk for hepatitis B, you should be screened for this virus. You are considered at high risk for hepatitis B if:  You were born in a country where hepatitis B is common. Ask your health care provider which countries are considered high risk.  Your parents were born in a high-risk country, and you have not been immunized against hepatitis B (hepatitis B vaccine).  You have HIV or AIDS.  You use needles to inject street drugs.  You live with someone who has hepatitis B.  You have had sex with someone who has hepatitis B.  You get hemodialysis treatment.  You take certain medicines for conditions, including cancer, organ transplantation, and autoimmune conditions. Hepatitis C  Blood testing is recommended for:  Everyone born from [birth year] through 1963.  Anyone with known risk factors for hepatitis C. Sexually transmitted infections (STIs)  You should be screened for sexually transmitted infections (STIs) including gonorrhea and chlamydia if:  You are sexually active and are younger than 63 years of age.  You are older than 63 years of age and your health care provider tells you that you are at risk for this type of infection.  Your sexual activity has changed since you were last screened and you are at an increased risk for chlamydia or gonorrhea. Ask your health care provider if you are at risk.  If you do not have HIV, but are at risk, it may be  recommended that you take a prescription medicine daily to prevent HIV infection. This is called pre-exposure prophylaxis (PrEP). You are considered at risk if:  You are sexually active and do not regularly use condoms or know the HIV status of your partner(s).  You take drugs by injection.  You are sexually active with a partner who has HIV. Talk with your health care provider about whether you are at high risk of being infected with HIV. If you choose to begin PrEP, you  should first be tested for HIV. You should then be tested every 3 months for as long as you are taking PrEP.  PREGNANCY   If you are premenopausal and you may become pregnant, ask your health care provider about preconception counseling.  If you may become pregnant, take 400 to 800 micrograms (mcg) of folic acid every day.  If you want to prevent pregnancy, talk to your health care provider about birth control (contraception). OSTEOPOROSIS AND MENOPAUSE   Osteoporosis is a disease in which the bones lose minerals and strength with aging. This can result in serious bone fractures. Your risk for osteoporosis can be identified using a bone density scan.  If you are 7 years of age or older, or if you are at risk for osteoporosis and fractures, ask your health care provider if you should be screened.  Ask your health care provider whether you should take a calcium or vitamin D supplement to lower your risk for osteoporosis.  Menopause may have certain physical symptoms and risks.  Hormone replacement therapy may reduce some of these symptoms and risks. Talk to your health care provider about whether hormone replacement therapy is right for you.  HOME CARE INSTRUCTIONS   Schedule regular health, dental, and eye exams.  Stay current with your immunizations.   Do not use any tobacco products including cigarettes, chewing tobacco, or electronic cigarettes.  If you are pregnant, do not drink alcohol.  If you are  breastfeeding, limit how much and how often you drink alcohol.  Limit alcohol intake to no more than 1 drink per day for nonpregnant women. One drink equals 12 ounces of beer, 5 ounces of wine, or 1 ounces of hard liquor.  Do not use street drugs.  Do not share needles.  Ask your health care provider for help if you need support or information about quitting drugs.  Tell your health care provider if you often feel depressed.  Tell your health care provider if you have ever been abused or do not feel safe at home.   This information is not intended to replace advice given to you by your health care provider. Make sure you discuss any questions you have with your health care provider.   Document Released: 04/12/2011 Document Revised: 10/18/2014 Document Reviewed: 08/29/2013 Elsevier Interactive Patient Education [employer].

## 2016-05-17 NOTE — Assessment & Plan Note (Signed)
1) Anticipatory Guidance: Discussed importance of wearing a seatbelt while driving and not texting while driving; changing batteries in smoke detector at least once annually; wearing suntan lotion when outside; eating a balanced and moderate diet; getting physical activity at least 30 minutes per day.  2) Immunizations / Screenings / Labs:  Zostavax sent to pharmacy. All other immunizations are up-to-date per recommendations. Declines hepatitis C screening, however we will obtain screen during next blood work. All other screenings are up-to-date per recommendations. Blood work previously completed with no significant abnormalities.   Overall well exam with risk factors for cardiovascular disease including hypertension, hyperlipidemia, and overweight. Blood pressure and cholesterol appear to be well controlled with current regimens and no adverse side effects. Recommended weight loss of 5-10% of current body weight through nutrition and physical activity changes. Nutritional intake that is moderate, balance, and varied and low in saturated fats and processed/sugary foods and increasing physical activity to 30 minutes of mild to moderate level activity daily. Continue healthy lifestyle behaviors and choices. Follow-up prevention exam in 1 year. Follow-up office visit for chronic conditions in 3 months.

## 2016-05-20 ENCOUNTER — Ambulatory Visit (INDEPENDENT_AMBULATORY_CARE_PROVIDER_SITE_OTHER): Payer: 59 | Admitting: Emergency Medicine

## 2016-05-20 DIAGNOSIS — Z23 Encounter for immunization: Secondary | ICD-10-CM | POA: Diagnosis not present

## 2016-05-20 DIAGNOSIS — M2042 Other hammer toe(s) (acquired), left foot: Secondary | ICD-10-CM | POA: Diagnosis not present

## 2016-05-20 DIAGNOSIS — M2041 Other hammer toe(s) (acquired), right foot: Secondary | ICD-10-CM | POA: Diagnosis not present

## 2016-05-20 DIAGNOSIS — M7741 Metatarsalgia, right foot: Secondary | ICD-10-CM | POA: Diagnosis not present

## 2016-05-20 DIAGNOSIS — M7742 Metatarsalgia, left foot: Secondary | ICD-10-CM | POA: Diagnosis not present

## 2016-05-20 MED FILL — ZOLPIDEM TARTRATE 10 MG TAB: 10 | 30 days supply | Qty: 30 | Fill #1

## 2016-05-20 NOTE — Progress Notes (Signed)
Shingles vaccine sent to [facility], Pt came into office to have vaccine administered.   Given in Left arm @ 1030.  Lot # [id number] EXP 12/25/2016 [place] [id number]

## 2016-06-21 ENCOUNTER — Other Ambulatory Visit: Payer: Self-pay | Admitting: Cardiology

## 2016-06-24 ENCOUNTER — Other Ambulatory Visit: Payer: Self-pay | Admitting: Family Medicine

## 2016-06-24 MED FILL — MYRBETRIQ ER 25 MG TABLET: 25 | 30 days supply | Qty: 30 | Fill #6

## 2016-06-25 ENCOUNTER — Other Ambulatory Visit: Payer: Self-pay | Admitting: Obstetrics and Gynecology

## 2016-06-25 DIAGNOSIS — Z1231 Encounter for screening mammogram for malignant neoplasm of breast: Secondary | ICD-10-CM

## 2016-06-25 MED FILL — tiZANidine HCL 4 MG TABS: 4 | 30 days supply | Qty: 90 | Fill #0

## 2016-06-30 ENCOUNTER — Other Ambulatory Visit: Payer: Self-pay

## 2016-06-30 DIAGNOSIS — Z23 Encounter for immunization: Secondary | ICD-10-CM | POA: Diagnosis not present

## 2016-06-30 DIAGNOSIS — L918 Other hypertrophic disorders of the skin: Secondary | ICD-10-CM | POA: Diagnosis not present

## 2016-06-30 DIAGNOSIS — D485 Neoplasm of uncertain behavior of skin: Secondary | ICD-10-CM | POA: Diagnosis not present

## 2016-06-30 DIAGNOSIS — D210 Benign neoplasm of connective and other soft tissue of head, face and neck: Secondary | ICD-10-CM | POA: Diagnosis not present

## 2016-06-30 MED ORDER — ZOLPIDEM TARTRATE 10 MG PO TABS: 10.0000 mg | ORAL_TABLET | Freq: Every evening | ORAL | 4 refills | Status: DC | PRN

## 2016-07-01 MED FILL — ZOLPIDEM TARTRATE 10 MG TAB: 10 | 30 days supply | Qty: 30 | Fill #0

## 2016-07-07 ENCOUNTER — Encounter: Payer: Self-pay | Admitting: Gastroenterology

## 2016-07-07 ENCOUNTER — Ambulatory Visit (INDEPENDENT_AMBULATORY_CARE_PROVIDER_SITE_OTHER): Payer: 59 | Admitting: Gastroenterology

## 2016-07-07 VITALS — BP 144/90 | HR 82 | Ht 67.5 in | Wt 179.5 lb

## 2016-07-07 DIAGNOSIS — K219 Gastro-esophageal reflux disease without esophagitis: Secondary | ICD-10-CM | POA: Diagnosis not present

## 2016-07-07 MED ORDER — PANTOPRAZOLE SODIUM 40 MG PO TBEC: 40.0000 mg | DELAYED_RELEASE_TABLET | Freq: Two times a day (BID) | ORAL | 11 refills | Status: DC

## 2016-07-07 NOTE — Patient Instructions (Addendum)
We will make sure you are set for recall colonoscopy 12/2018 for routine risk colon cancer screening. Will refill the protonix in 1 year but if you still need prescription PPI in 2 years (such as protonix) would like to see you back in the office around then. Please contact me sooner if any worse trouble swallowing.   New prescription for protonix 40mg  pill, one pill twice daily, 1 month with 11 refills. Try cutting the protonix down to once daily (AM dosing).

## 2016-07-07 NOTE — Progress Notes (Signed)
Review of pertinent gastrointestinal problems: 1. Routine risk for colon cancer. Dr. Watt Climes colonoscopy 12/2008 for routine risk screening: small hemorrhoids, "tiny" polyp that was not adenomatous on pathology.  Recall colonoscopy 12/2018 2. GERD   HPI: This is a very pleasant 65 year old woman whom I work with at [facility] endoscopies  Chief complaint is chronic GERD  She takes protonix; otherwise pyrosis.  Usually takes it in AM before  Minor dypshagia about 1-2 per month.  Extra sodas seem to make this worse.  Never has to vomit  Has been for about 2 years  ROS: complete GI ROS as described in HPI.  Constitutional:  No unintentional weight loss  No lower Gi issues.     Past Medical History:  Diagnosis Date  . Chest pain   . GERD (gastroesophageal reflux disease)   . H/O: hysterectomy   . History of breast cancer 2006  . History of colonic polyps   . HLD (hyperlipidemia)   . HTN (hypertension)     Past Surgical History:  Procedure Laterality Date  . ABDOMINAL HYSTERECTOMY    . BREAST LUMPECTOMY  2006  . COLONOSCOPY W/ POLYPECTOMY  2004, 2009   Dr Watt Climes  . TUBAL LIGATION      Current Outpatient Prescriptions  Medication Sig Dispense Refill  . acetaminophen (TYLENOL) 500 MG tablet Take 500 mg by mouth every 6 (six) hours as needed for mild pain or headache.    [name] amLODipine (NORVASC) 10 MG tablet Take 1 tablet (10 mg total) by mouth daily. 90 tablet 3  . aspirin 81 MG tablet Take 1 tablet (81 mg total) by mouth daily. 30 tablet 6  . cetirizine (ZYRTEC) 10 MG tablet Take 10 mg by mouth daily.    . Cholecalciferol (VITAMIN D3) 1000 UNITS CAPS Take 1 capsule by mouth daily.     . Coenzyme Q10 (CO Q 10) 100 MG CAPS Take 1 capsule by mouth every morning.    . diphenhydrAMINE (BENADRYL) 25 mg capsule Take 25 mg by mouth every 6 (six) hours as needed for allergies.    . fluticasone (FLONASE) 50 MCG/ACT nasal spray Place 1 spray into both nostrils 2 (two) times daily as  needed for allergies or rhinitis. 16 g 2  . labetalol (NORMODYNE) 300 MG tablet Take 1 tablet (300 mg total) by mouth 2 (two) times daily. 180 tablet 3  . magnesium oxide (MAG-OX) 400 MG tablet 1 tablet by mouth at bedtime    . mirabegron ER (MYRBETRIQ) 25 MG TB24 tablet Take 25 mg by mouth daily.    . pantoprazole (PROTONIX) 40 MG tablet Take 1 tablet (40 mg total) by mouth 2 (two) times daily before a meal. 120 tablet 0  . rosuvastatin (CRESTOR) 10 MG tablet Take 1 tablet (10 mg total) by mouth daily. 30 tablet 3  . spironolactone (ALDACTONE) 25 MG tablet TAKE 1 TABLET BY MOUTH ONCE DAILY IN PLACE OF GENERIC DYAZIDE AND POTASSIUM SUPPLEMENT 90 tablet 3  . tiZANidine (ZANAFLEX) 4 MG tablet TAKE 1 TABLET BY MOUTH 3 TIMES DAILY. 90 tablet 1  . zolpidem (AMBIEN) 10 MG tablet Take 1 tablet (10 mg total) by mouth at bedtime as needed. for sleep 30 tablet 4   No current facility-administered medications for this visit.     Allergies as of 07/07/2016  . (No Known Allergies)    Family History  Problem Relation Age of Onset  . Heart attack Father 32  . Hypertension Mother   . Coronary artery disease  Mother   . Aneurysm Brother   . Asthma Other     nephew  . Hyperlipidemia Other     nephew  . Sudden death Other     nephew  . Colon cancer Neg Hx     Social History   Social History  . Marital status: Single    Spouse name: N/A  . Number of children: N/A  . Years of education: N/A   Occupational History  . Not on file.   Social History Main Topics  . Smoking status: Never Smoker  . Smokeless tobacco: Never Used  . Alcohol use 1.2 oz/week    2 Cans of beer per week     Comment: rarely  . Drug use: No  . Sexual activity: Not on file   Other Topics Concern  . Not on file   Social History Narrative  . No narrative on file     Physical Exam: There were no vitals taken for this visit. Constitutional: generally well-appearing Psychiatric: alert and oriented x3 Abdomen:  soft, nontender, nondistended, no obvious ascites, no peritoneal signs, normal bowel sounds No peripheral edema noted in lower extremities  Assessment and plan: 65 y.o. female with Chronic GERD  She does have very mild intermittent dysphagia about once a month. She is pretty clear that this been going on for years, it is not worsening. She notices clear relation between a lot of extra sodas and this symptom. Generally clears up after one day. I think it is very unlikely this is anything serious. She knows to call if this dysphagia worsens. Going to refill her proton pump inhibitor at 40 mg dose, one pill twice daily before meals. She has never tried backing down to one pill once daily but she will try that and she has if it is just as effective at controlling her pyrosis, GERD symptoms and she will stay on once daily. I'm happy to refill her twice-daily prescription again in one year but if she still requires prescription strength I would like to see her again in 2 years in the office. We will make sure her colonoscopy recall is 2020 for routine risk colon cancer screening   Owens Loffler, MD [facility] Gastroenterology 07/07/2016, 3:37 PM

## 2016-07-12 ENCOUNTER — Ambulatory Visit
Admission: RE | Admit: 2016-07-12 | Discharge: 2016-07-12 | Disposition: A | Discharge status: Home / Self Care | Payer: 59 | Source: Ambulatory Visit | Attending: Obstetrics and Gynecology | Admitting: Obstetrics and Gynecology

## 2016-07-12 ENCOUNTER — Ambulatory Visit: Payer: 59

## 2016-07-12 DIAGNOSIS — Z1231 Encounter for screening mammogram for malignant neoplasm of breast: Secondary | ICD-10-CM

## 2016-07-14 DIAGNOSIS — Z6828 Body mass index (BMI) 28.0-28.9, adult: Secondary | ICD-10-CM | POA: Diagnosis not present

## 2016-07-14 DIAGNOSIS — Z01419 Encounter for gynecological examination (general) (routine) without abnormal findings: Secondary | ICD-10-CM | POA: Diagnosis not present

## 2016-08-04 DIAGNOSIS — R1084 Generalized abdominal pain: Secondary | ICD-10-CM | POA: Diagnosis not present

## 2016-08-04 DIAGNOSIS — M549 Dorsalgia, unspecified: Secondary | ICD-10-CM | POA: Diagnosis not present

## 2016-08-05 DIAGNOSIS — N200 Calculus of kidney: Secondary | ICD-10-CM | POA: Diagnosis not present

## 2016-08-05 DIAGNOSIS — R351 Nocturia: Secondary | ICD-10-CM | POA: Diagnosis not present

## 2016-08-05 DIAGNOSIS — R350 Frequency of micturition: Secondary | ICD-10-CM | POA: Diagnosis not present

## 2016-08-05 DIAGNOSIS — N3941 Urge incontinence: Secondary | ICD-10-CM | POA: Diagnosis not present

## 2016-08-19 DIAGNOSIS — R3129 Other microscopic hematuria: Secondary | ICD-10-CM | POA: Diagnosis not present

## 2016-08-19 DIAGNOSIS — N3941 Urge incontinence: Secondary | ICD-10-CM | POA: Diagnosis not present

## 2016-08-19 DIAGNOSIS — R350 Frequency of micturition: Secondary | ICD-10-CM | POA: Diagnosis not present

## 2016-08-20 MED FILL — ZOLPIDEM TARTRATE 10 MG TAB: 10 | 30 days supply | Qty: 30 | Fill #0

## 2016-09-14 MED FILL — VESIcare 5 MG TABS: 5 | 30 days supply | Qty: 30 | Fill #0

## 2016-09-23 MED FILL — ZOLPIDEM TARTRATE 10 MG TAB: 10 | 30 days supply | Qty: 30 | Fill #0

## 2016-10-15 ENCOUNTER — Ambulatory Visit: Payer: 59 | Admitting: Family

## 2016-10-19 ENCOUNTER — Other Ambulatory Visit: Payer: Self-pay | Admitting: Cardiology

## 2016-10-19 DIAGNOSIS — I739 Peripheral vascular disease, unspecified: Secondary | ICD-10-CM

## 2016-10-19 DIAGNOSIS — I6523 Occlusion and stenosis of bilateral carotid arteries: Secondary | ICD-10-CM

## 2016-10-19 MED FILL — AMLODIPINE BESYLATE 10 MG T: 10 | 90 days supply | Qty: 90 | Fill #0

## 2016-10-22 MED FILL — ZOLPIDEM TARTRATE 10 MG TAB: 10 | 30 days supply | Qty: 30 | Fill #1

## 2016-11-03 MED FILL — PANTOPRAZOLE SOD DR 40 MG T: 40 | 90 days supply | Qty: 180 | Fill #0

## 2016-11-08 MED FILL — VESIcare 5 MG TABS: 5 | 30 days supply | Qty: 30 | Fill #1

## 2016-11-09 ENCOUNTER — Other Ambulatory Visit: Payer: Self-pay | Admitting: Occupational Medicine

## 2016-11-09 ENCOUNTER — Ambulatory Visit: Payer: Self-pay

## 2016-11-09 DIAGNOSIS — M25562 Pain in left knee: Secondary | ICD-10-CM

## 2016-11-24 MED FILL — ZOLPIDEM TARTRATE 10 MG TAB: 10 | 30 days supply | Qty: 30 | Fill #2 | Status: TO

## 2016-12-07 MED FILL — VESIcare 5 MG TABS: 5 | 30 days supply | Qty: 30 | Fill #2

## 2016-12-07 MED FILL — SPIRONOLACTONE 25 MG TABLET: 25 | 90 days supply | Qty: 90 | Fill #1

## 2017-01-13 MED FILL — tiZANidine HCL 4 MG TABS: 4 | 30 days supply | Qty: 90 | Fill #1

## 2017-01-13 MED FILL — ZOLPIDEM TARTRATE 10 MG TAB: 10 | 30 days supply | Qty: 30 | Fill #0

## 2017-02-09 ENCOUNTER — Ambulatory Visit: Payer: BLUE CROSS/BLUE SHIELD | Admitting: Family Medicine

## 2017-03-24 ENCOUNTER — Other Ambulatory Visit: Payer: Self-pay | Admitting: Cardiology

## 2017-03-30 ENCOUNTER — Encounter: Payer: Self-pay | Admitting: Cardiology

## 2017-03-30 ENCOUNTER — Ambulatory Visit (INDEPENDENT_AMBULATORY_CARE_PROVIDER_SITE_OTHER): Payer: BLUE CROSS/BLUE SHIELD | Admitting: Cardiology

## 2017-03-30 VITALS — BP 130/70 | HR 86 | Ht 67.5 in | Wt 181.0 lb

## 2017-03-30 DIAGNOSIS — R0683 Snoring: Secondary | ICD-10-CM

## 2017-03-30 DIAGNOSIS — Z01818 Encounter for other preprocedural examination: Secondary | ICD-10-CM

## 2017-03-30 DIAGNOSIS — R011 Cardiac murmur, unspecified: Secondary | ICD-10-CM | POA: Insufficient documentation

## 2017-03-30 DIAGNOSIS — I340 Nonrheumatic mitral (valve) insufficiency: Secondary | ICD-10-CM | POA: Diagnosis not present

## 2017-03-30 DIAGNOSIS — I6523 Occlusion and stenosis of bilateral carotid arteries: Secondary | ICD-10-CM

## 2017-03-30 DIAGNOSIS — I10 Essential (primary) hypertension: Secondary | ICD-10-CM

## 2017-03-30 DIAGNOSIS — G4700 Insomnia, unspecified: Secondary | ICD-10-CM | POA: Insufficient documentation

## 2017-03-30 HISTORY — DX: Snoring: R06.83

## 2017-03-30 HISTORY — DX: Cardiac murmur, unspecified: R01.1

## 2017-03-30 MED ORDER — ZOLPIDEM TARTRATE 10 MG PO TABS: 5.0000 mg | ORAL_TABLET | Freq: Every evening | ORAL | 3 refills | Status: DC | PRN

## 2017-03-30 MED FILL — ZOLPIDEM TARTRATE 10 MG TAB: 10 | 30 days supply | Qty: 15 | Fill #0

## 2017-03-30 NOTE — Patient Instructions (Signed)
Medication Instructions:  Your physician recommends that you continue on your current medications as directed. Please refer to the Current Medication list given to you today.   Labwork: None  Testing/Procedures: Your physician has requested that you have an echocardiogram. Echocardiography is a painless test that uses sound waves to create images of your heart. It provides your doctor with information about the size and shape of your heart and how well your heart's chambers and valves are working. This procedure takes approximately one hour. There are no restrictions for this procedure.   Dr. Radford Pax recommends you have a HOME SLEEP TEST. You will be called to arrange this.  Follow-Up: Your physician wants you to follow-up in: 1 year with Dr. Radford Pax. You will receive a reminder letter in the mail two months in advance. If you don't receive a letter, please call our office to schedule the follow-up appointment.   Any Other Special Instructions Will Be Listed Below (If Applicable).     If you need a refill on your cardiac medications before your next appointment, please call your pharmacy.

## 2017-03-30 NOTE — Progress Notes (Signed)
0   Cardiology Office Note    Date:  03/30/2017   ID:  Laura Poole, DOB 03-Apr-1951, MRN [id number]  PCP:  Golden Circle, FNP  Cardiologist:  Fransico Him, MD   Chief Complaint  Patient presents with  . Hypertension  . Mitral Regurgitation    History of Present Illness:  Laura Poole is a 66 y.o. female with history of breast cancer, carotid stenosis (doppers 04/2016 were normal), and atypical chest pain as well as exertional dyspnea who had been followed by Dr. Aundra Dubin in the past. She was last seen by Almyra Deforest, PA last year .  Patient has had a history of nonexertional chest pain. Last Myoview obtained in April 2011 showed no evidence for ischemia. She had a cardiac catheterization in April 2013 which showed no coronary disease. She had a carotid artery ultrasound in 2015, which showed elevated left ICA is velocity likely due to tortuosity. Her last echocardiogram obtained on 09/10/2015 showed EF 49-44%, grade 1 diastolic dysfunction, moderate LVH, mild MR. She has chronic dyspnea on exertion.  Outpatient arterial Doppler for leg cramps was ordered which showed normal blood flow without obvious lower extremity stenosis.  She is here today for followup and is doing well.  She denies any chest pain or pressure.  She says that she has no SOB or DOE.  She has been told that she snores and wakes up with a dry mouth.  She feels rested in the am and has no daytime sleepiness. She does have problems with laying flat and gets SOB.  She only has LE edema in her ankles when she has been on her feet a lot.  She denies any dizziness, palpitations or syncope.  She denies any claudication symptoms.  Past Medical History:  Diagnosis Date  . Chest pain   . GERD (gastroesophageal reflux disease)   . H/O: hysterectomy   . History of breast cancer 2006  . History of colonic polyps   . HLD (hyperlipidemia)   . HTN (hypertension)     Past Surgical History:  Procedure Laterality Date  . ABDOMINAL  HYSTERECTOMY    . BREAST LUMPECTOMY  2006  . COLONOSCOPY W/ POLYPECTOMY  2004, 2009   Dr Watt Climes  . TUBAL LIGATION      Current Medications: Current Meds  Medication Sig  . acetaminophen (TYLENOL) 500 MG tablet Take 500 mg by mouth every 6 (six) hours as needed for mild pain or headache.  [name] amLODipine (NORVASC) 10 MG tablet Take 1 tablet (10 mg total) by mouth daily.  [name] aspirin 81 MG tablet Take 1 tablet (81 mg total) by mouth daily.  . Coenzyme Q10 (CO Q 10) 100 MG CAPS Take 1 capsule by mouth every morning.  . diphenhydrAMINE (BENADRYL) 25 mg capsule Take 25 mg by mouth every 6 (six) hours as needed for allergies.  . fluticasone (FLONASE) 50 MCG/ACT nasal spray Place 1 spray into both nostrils 2 (two) times daily as needed for allergies or rhinitis.  [name] labetalol (NORMODYNE) 300 MG tablet Take 1 tablet (300 mg total) by mouth 2 (two) times daily.  . magnesium oxide (MAG-OX) 400 MG tablet 1 tablet by mouth at bedtime  . pantoprazole (PROTONIX) 40 MG tablet Take 1 tablet (40 mg total) by mouth 2 (two) times daily.  [name] spironolactone (ALDACTONE) 25 MG tablet TAKE 1 TABLET BY MOUTH ONCE DAILY IN PLACE OF GENERIC DYAZIDE AND POTASSIUM SUPPLEMENT  . tiZANidine (ZANAFLEX) 4 MG tablet TAKE 1 TABLET BY MOUTH 3  TIMES DAILY.  [name] zolpidem (AMBIEN) 10 MG tablet Take 1 tablet (10 mg total) by mouth at bedtime as needed. for sleep  . [DISCONTINUED] Cholecalciferol (VITAMIN D3) 1000 UNITS CAPS Take 1 capsule by mouth daily.   . [DISCONTINUED] mirabegron ER (MYRBETRIQ) 25 MG TB24 tablet Take 25 mg by mouth daily.  . [DISCONTINUED] rosuvastatin (CRESTOR) 10 MG tablet Take 1 tablet (10 mg total) by mouth daily.    Allergies:   Patient has no known allergies.   Social History   Social History  . Marital status: Single    Spouse name: N/A  . Number of children: N/A  . Years of education: N/A   Social History Main Topics  . Smoking status: Never Smoker  . Smokeless tobacco: Never Used  . Alcohol use  1.2 oz/week    2 Cans of beer per week     Comment: rarely  . Drug use: No  . Sexual activity: Not Asked   Other Topics Concern  . None   Social History Narrative  . None     Family History:  The patient's family history includes Aneurysm in her brother; Asthma in her other; Coronary artery disease in her mother; Heart attack (age of onset: 16) in her father; Hyperlipidemia in her other; Hypertension in her mother; Sudden death in her other.   ROS:   Please see the history of present illness.    ROS All other systems reviewed and are negative.  No flowsheet data found.     PHYSICAL EXAM:   VS:  BP 130/70   Pulse 86   Ht 5' 7.5" (1.715 m)   Wt 181 lb (82.1 kg)   SpO2 98%   BMI 27.93 kg/m    GEN: Well nourished, well developed, in no acute distress  HEENT: normal  Neck: no JVD, carotid bruits, or masses Cardiac: RRR; no murmurs, rubs, or gallops,no edema.  Intact distal pulses bilaterally.  Respiratory:  clear to auscultation bilaterally, normal work of breathing GI: soft, nontender, nondistended, + BS MS: no deformity or atrophy  Skin: warm and dry, no rash Neuro:  Alert and Oriented x 3, Strength and sensation are intact Psych: euthymic mood, full affect  Wt Readings from Last 3 Encounters:  03/30/17 181 lb (82.1 kg)  07/07/16 179 lb 8 oz (81.4 kg)  05/17/16 179 lb (81.2 kg)      Studies/Labs Reviewed:   EKG:  EKG is ordered today.  The ekg ordered today demonstrates NSR with first degree AV block  abnormality  Recent Labs: 04/12/2016: Hemoglobin 12.2; Platelets 339; TSH 1.78   Lipid Panel    Component Value Date/Time   CHOL 147 11/04/2015 0738   TRIG 80 11/04/2015 0738   HDL 65 11/04/2015 0738   CHOLHDL 2.3 11/04/2015 0738   VLDL 16 11/04/2015 0738   LDLCALC 66 11/04/2015 0738    Additional studies/ records that were reviewed today include:  none    ASSESSMENT:    1. Bilateral carotid artery stenosis   2. Essential hypertension   3.  Non-rheumatic mitral regurgitation   4. Heart murmur   5. Insomnia, unspecified type   6. Preoperative clearance      PLAN:  In order of problems listed above:  1. Bilateral carotid artery stenosis - last dopplers showed normal carotid arteries with no plaque. 2. HTN - BP is adequately controlled on exam today.  She will continue on amlodipine 10mg  daily, Labetolol 300mg  BID and aldactone 25mg  daily. 3. Mild MR  by echo 08/2015. 4. Heart murmur - this is at the RUSB and c/w AVSC - echo in 2016 showed calcified noncoronary cusp but no AS.  I will repeat an echo 5. Insomnia - this has been present since the death of her mother.  She does not use it often.  I will refill a Rx for Ambien 10mg  1/2 tablet qhs PRN.  She is also having a lot of problems with snoring and SOB when laying flat at night so I will get a home sleep study to evaluate for OSA. 6. Preoperative cardiac clearance - she is having hammer toe surgery.  She has no active cardiac issues although she does have a heart murmur.  If echo is stable with she will be low risk for surgery.    Medication Adjustments/Labs and Tests Ordered: Current medicines are reviewed at length with the patient today.  Concerns regarding medicines are outlined above.  Medication changes, Labs and Tests ordered today are listed in the Patient Instructions below.  There are no Patient Instructions on file for this visit.   Signed, Fransico Him, MD  03/30/2017 11:11 AM    [address], [place], [place]  [address] Phone: [phone]; Fax: [phone]

## 2017-04-04 ENCOUNTER — Ambulatory Visit: Payer: Self-pay | Admitting: Internal Medicine

## 2017-04-04 ENCOUNTER — Telehealth: Payer: Self-pay | Admitting: *Deleted

## 2017-04-04 NOTE — Telephone Encounter (Signed)
Home Sleep Study ordered through [employer] 04/04/2017.

## 2017-04-05 ENCOUNTER — Ambulatory Visit: Payer: BLUE CROSS/BLUE SHIELD | Admitting: Family

## 2017-04-05 NOTE — Telephone Encounter (Signed)
Informed patient of upcoming home sleep study and patient understanding was verbalized. Patient understands her sleep study will be done through [facility].. Patient understands she will receive a call in a week or so to set up her sleep study. Patient understands to call if she does not receive a call in a timely manner. Patient agrees with treatment and thanked me for call. Patient's last office notes (03/30/2017) have been faxed to [facility] order #[id number].  Fax  [phone].

## 2017-04-08 ENCOUNTER — Ambulatory Visit (HOSPITAL_COMMUNITY): Payer: BLUE CROSS/BLUE SHIELD | Attending: Cardiology

## 2017-04-08 ENCOUNTER — Other Ambulatory Visit: Payer: Self-pay

## 2017-04-08 DIAGNOSIS — I517 Cardiomegaly: Secondary | ICD-10-CM | POA: Diagnosis not present

## 2017-04-08 DIAGNOSIS — I253 Aneurysm of heart: Secondary | ICD-10-CM | POA: Diagnosis not present

## 2017-04-08 DIAGNOSIS — R011 Cardiac murmur, unspecified: Secondary | ICD-10-CM | POA: Diagnosis not present

## 2017-04-11 ENCOUNTER — Encounter: Payer: Self-pay | Admitting: Family

## 2017-04-11 ENCOUNTER — Telehealth: Payer: Self-pay | Admitting: *Deleted

## 2017-04-11 ENCOUNTER — Ambulatory Visit (INDEPENDENT_AMBULATORY_CARE_PROVIDER_SITE_OTHER): Payer: BLUE CROSS/BLUE SHIELD | Admitting: Family

## 2017-04-11 ENCOUNTER — Other Ambulatory Visit (INDEPENDENT_AMBULATORY_CARE_PROVIDER_SITE_OTHER): Payer: BLUE CROSS/BLUE SHIELD

## 2017-04-11 ENCOUNTER — Ambulatory Visit: Payer: BLUE CROSS/BLUE SHIELD | Admitting: Family

## 2017-04-11 VITALS — BP 142/88 | HR 83 | Temp 97.8°F | Resp 16 | Ht 67.75 in | Wt 178.4 lb

## 2017-04-11 DIAGNOSIS — M2042 Other hammer toe(s) (acquired), left foot: Secondary | ICD-10-CM

## 2017-04-11 DIAGNOSIS — I6523 Occlusion and stenosis of bilateral carotid arteries: Secondary | ICD-10-CM | POA: Diagnosis not present

## 2017-04-11 DIAGNOSIS — Z01818 Encounter for other preprocedural examination: Secondary | ICD-10-CM

## 2017-04-11 DIAGNOSIS — E785 Hyperlipidemia, unspecified: Secondary | ICD-10-CM | POA: Diagnosis not present

## 2017-04-11 DIAGNOSIS — I10 Essential (primary) hypertension: Secondary | ICD-10-CM | POA: Diagnosis not present

## 2017-04-11 DIAGNOSIS — M2041 Other hammer toe(s) (acquired), right foot: Secondary | ICD-10-CM | POA: Diagnosis not present

## 2017-04-11 DIAGNOSIS — M2040 Other hammer toe(s) (acquired), unspecified foot: Secondary | ICD-10-CM | POA: Insufficient documentation

## 2017-04-11 LAB — COMPREHENSIVE METABOLIC PANEL
ALBUMIN: 4.1 g/dL (ref 3.5–5.2)
ALT: 14 U/L (ref 0–35)
AST: 13 U/L (ref 0–37)
Alkaline Phosphatase: 57 U/L (ref 39–117)
BILIRUBIN TOTAL: 0.3 mg/dL (ref 0.2–1.2)
BUN: 8 mg/dL (ref 6–23)
CHLORIDE: 104 meq/L (ref 96–112)
CO2: 30 meq/L (ref 19–32)
CREATININE: 0.84 mg/dL (ref 0.40–1.20)
Calcium: 9.5 mg/dL (ref 8.4–10.5)
GFR: 87.79 mL/min (ref >60.00)
Glucose, Bld: 89 mg/dL (ref 70–99)
Potassium: 3.9 mEq/L (ref 3.5–5.1)
SODIUM: 142 meq/L (ref 135–145)
Total Protein: 7.2 g/dL (ref 6.0–8.3)

## 2017-04-11 LAB — CBC
HCT: 37.7 % (ref 36.0–46.0)
Hemoglobin: 12.2 g/dL (ref 12.0–15.0)
MCHC: 32.5 g/dL (ref 30.0–36.0)
MCV: 88.7 fl (ref 78.0–100.0)
Platelets: 312 10*3/uL (ref 150.0–400.0)
RBC: 4.25 Mil/uL (ref 3.87–5.11)
RDW: 15.7 % — ABNORMAL HIGH (ref 11.5–15.5)
WBC: 6.5 10*3/uL (ref 4.0–10.5)

## 2017-04-11 MED ORDER — LABETALOL HCL 300 MG PO TABS: 300.0000 mg | ORAL_TABLET | Freq: Two times a day (BID) | ORAL | 3 refills | Status: DC

## 2017-04-11 MED ORDER — SPIRONOLACTONE 25 MG PO TABS: ORAL_TABLET | ORAL | 3 refills | Status: DC

## 2017-04-11 MED ORDER — AMLODIPINE BESYLATE 10 MG PO TABS: 10.0000 mg | ORAL_TABLET | Freq: Every day | ORAL | 3 refills | Status: DC

## 2017-04-11 MED FILL — AMLODIPINE BESYLATE 10 MG T: 10 | 90 days supply | Qty: 90 | Fill #0

## 2017-04-11 MED FILL — SPIRONOLACTONE 25 MG TABLET: 25 | 90 days supply | Qty: 90 | Fill #0

## 2017-04-11 MED FILL — LABETALOL HCL 300 MG TABLET: 300 | 30 days supply | Qty: 60 | Fill #0

## 2017-04-11 NOTE — Assessment & Plan Note (Signed)
Blood pressure appears at goal and slightly above. Continue current dosage of spironolactone, amlodipine, and labetalol. Continue to monitor blood pressure at home and follow low-sodium diet. Denies worse headache of life with no new symptoms of end organ damage noted on physical exam.

## 2017-04-11 NOTE — Assessment & Plan Note (Signed)
Bilateral hammertoes with surgical intervention recommended and presenting for preoperative clearance

## 2017-04-11 NOTE — Progress Notes (Signed)
Subjective:    Patient ID: Laura Poole, female    DOB: March 29, 1951, 66 y.o.   MRN: [id number]  Chief Complaint  Patient presents with  . Pre-op Exam    having foot surgery on both feet, wanted ot make sure everything was good, has already followed up with cardiology, states she wants refill of labetelol     HPI:  Laura Poole is a 66 y.o. female who  has a past medical history of Chest pain; GERD (gastroesophageal reflux disease); H/O: hysterectomy; History of breast cancer (2006); History of colonic polyps; HLD (hyperlipidemia); and HTN (hypertension). and presents today for a follow up office visit.   1.) Hypertension - Currently maintained on labetalol, amlodipine, and spironolactone. Reports taking the medications as prescribed and denies adverse side effects or hypotensive readings. Denies changes in vision, worst headache of life or new symptoms of end organ damage. Working on following a low sodium diet.   BP Readings from Last 3 Encounters:  04/11/17 (!) 142/88  03/30/17 130/70  07/07/16 (!) 144/90    2.) Hammer toe - Scheduled to have surgery on her bilateral feet to correct hammertoe which will be performed by Dr. Doran Durand. She has tried conservative treatment with the next option being surgery.    No Known Allergies    Outpatient Medications Prior to Visit  Medication Sig Dispense Refill  . acetaminophen (TYLENOL) 500 MG tablet Take 500 mg by mouth every 6 (six) hours as needed for mild pain or headache.    [name] aspirin 81 MG tablet Take 1 tablet (81 mg total) by mouth daily. 30 tablet 6  . Coenzyme Q10 (CO Q 10) 100 MG CAPS Take 1 capsule by mouth every morning.    . diphenhydrAMINE (BENADRYL) 25 mg capsule Take 25 mg by mouth every 6 (six) hours as needed for allergies.    . fluticasone (FLONASE) 50 MCG/ACT nasal spray Place 1 spray into both nostrils 2 (two) times daily as needed for allergies or rhinitis. 16 g 2  . magnesium oxide (MAG-OX) 400 MG tablet 1 tablet  by mouth at bedtime    . tiZANidine (ZANAFLEX) 4 MG tablet TAKE 1 TABLET BY MOUTH 3 TIMES DAILY. 90 tablet 1  . zolpidem (AMBIEN) 10 MG tablet Take 0.5 tablets (5 mg total) by mouth at bedtime as needed. for sleep 15 tablet 3  . amLODipine (NORVASC) 10 MG tablet Take 1 tablet (10 mg total) by mouth daily. 90 tablet 3  . labetalol (NORMODYNE) 300 MG tablet Take 1 tablet (300 mg total) by mouth 2 (two) times daily. 180 tablet 3  . spironolactone (ALDACTONE) 25 MG tablet TAKE 1 TABLET BY MOUTH ONCE DAILY IN PLACE OF GENERIC DYAZIDE AND POTASSIUM SUPPLEMENT 90 tablet 3  . pantoprazole (PROTONIX) 40 MG tablet Take 1 tablet (40 mg total) by mouth 2 (two) times daily. 60 tablet 11   No facility-administered medications prior to visit.       Past Surgical History:  Procedure Laterality Date  . ABDOMINAL HYSTERECTOMY    . BREAST LUMPECTOMY  2006  . COLONOSCOPY W/ POLYPECTOMY  2004, 2009   Dr Watt Climes  . TUBAL LIGATION        Past Medical History:  Diagnosis Date  . Chest pain   . GERD (gastroesophageal reflux disease)   . H/O: hysterectomy   . History of breast cancer 2006  . History of colonic polyps   . HLD (hyperlipidemia)   . HTN (hypertension)  Review of Systems  Constitutional: Negative for chills and fever.  Eyes:       Negative for changes in vision  Respiratory: Negative for cough, chest tightness and wheezing.   Cardiovascular: Negative for chest pain, palpitations and leg swelling.  Neurological: Negative for dizziness, weakness and light-headedness.      Objective:    BP (!) 142/88 (BP Location: Left Arm, Patient Position: Sitting, Cuff Size: Large)   Pulse 83   Temp 97.8 F (36.6 C) (Oral)   Resp 16   Ht 5' 7.75" (1.721 m)   Wt 178 lb 6.4 oz (80.9 kg)   SpO2 97%   BMI 27.33 kg/m  Nursing note and vital signs reviewed.  Physical Exam  Constitutional: She is oriented to person, place, and time. She appears well-developed and well-nourished. No  distress.  Cardiovascular: Normal rate, regular rhythm, normal heart sounds and intact distal pulses.   Pulmonary/Chest: Effort normal and breath sounds normal.  Musculoskeletal:  Bilateral hammer toes noted.   Neurological: She is alert and oriented to person, place, and time.  Skin: Skin is warm and dry.  Psychiatric: She has a normal mood and affect. Her behavior is normal. Judgment and thought content normal.       Assessment & Plan:   Problem List Items Addressed This Visit      Cardiovascular and Mediastinum   Essential hypertension    Blood pressure appears at goal and slightly above. Continue current dosage of spironolactone, amlodipine, and labetalol. Continue to monitor blood pressure at home and follow low-sodium diet. Denies worse headache of life with no new symptoms of end organ damage noted on physical exam.      Relevant Medications   labetalol (NORMODYNE) 300 MG tablet   amLODipine (NORVASC) 10 MG tablet   spironolactone (ALDACTONE) 25 MG tablet     Musculoskeletal and Integument   Hammer toes of both feet - Primary    Bilateral hammertoes with surgical intervention recommended and presenting for preoperative clearance      Relevant Orders   CBC (Completed)   Comprehensive metabolic panel (Completed)     Other   Preoperative clearance    Patient allergies, medications, and medical and social histories were reviewed. Obtain CBC and complete metabolic panel. Physical exam without significant findings. She does have snoring with concern for obstructive sleep apnea with pending sleep study. Chronic conditions appear adequately controlled with current medication regimens. Pending blood work patient can be medically cleared with cardiac clearance from cardiology as appropriate.      Relevant Orders   CBC (Completed)   Comprehensive metabolic panel (Completed)    Other Visit Diagnoses    Carotid stenosis, bilateral       Relevant Medications   labetalol  (NORMODYNE) 300 MG tablet   amLODipine (NORVASC) 10 MG tablet   spironolactone (ALDACTONE) 25 MG tablet       I am having Ms. Coaxum maintain her aspirin, magnesium oxide, fluticasone, diphenhydrAMINE, Co Q 10, acetaminophen, tiZANidine, pantoprazole, zolpidem, labetalol, amLODipine, and spironolactone.   Meds ordered this encounter  Medications  . labetalol (NORMODYNE) 300 MG tablet    Sig: Take 1 tablet (300 mg total) by mouth 2 (two) times daily.    Dispense:  180 tablet    Refill:  3    Order Specific Question:   Supervising Provider    Answer:   Pricilla Holm A [[id number]]  . amLODipine (NORVASC) 10 MG tablet    Sig: Take 1 tablet (10 mg total)  by mouth daily.    Dispense:  90 tablet    Refill:  3    Order Specific Question:   Supervising Provider    Answer:   Pricilla Holm A [[id number]]  . spironolactone (ALDACTONE) 25 MG tablet    Sig: TAKE 1 TABLET BY MOUTH ONCE DAILY IN PLACE OF GENERIC DYAZIDE AND POTASSIUM SUPPLEMENT    Dispense:  90 tablet    Refill:  3    Order Specific Question:   Supervising Provider    Answer:   Pricilla Holm A [[id number]]     Follow-up: Return if symptoms worsen or fail to improve.  Mauricio Po, FNP

## 2017-04-11 NOTE — Assessment & Plan Note (Signed)
Patient allergies, medications, and medical and social histories were reviewed. Obtain CBC and complete metabolic panel. Physical exam without significant findings. She does have snoring with concern for obstructive sleep apnea with pending sleep study. Chronic conditions appear adequately controlled with current medication regimens. Pending blood work patient can be medically cleared with cardiac clearance from cardiology as appropriate.

## 2017-04-11 NOTE — Patient Instructions (Addendum)
Thank you for choosing [facility].  SUMMARY AND INSTRUCTIONS:  We will check your blood work today.  Pending those results you will be cleared from a medical standpoint. Cardiology clearance will come from Dr. Radford Pax.  Would recommend follow up for sleep study.  Decrease the sodium in your nutritional intake.  Elevate legs when seated.  Compression socks as needed.   Medication:  Your prescription(s) have been submitted to your pharmacy or been printed and provided for you. Please take as directed and contact our office if you believe you are having problem(s) with the medication(s) or have any questions.  Labs:  Please stop by the lab on the lower level of the building for your blood work. Your results will be released to [facility] (or called to you) after review, usually within 72 hours after test completion. If any changes need to be made, you will be notified at that same time.  1.) The lab is open from 7:30am to 5:30 pm Monday-Friday 2.) No appointment is necessary 3.) Fasting (if needed) is 6-8 hours after food and drink; black coffee and water are okay    Follow up:  If your symptoms worsen or fail to improve, please contact our office for further instruction, or in case of emergency go directly to the emergency room at the closest medical facility.     DASH Eating Plan DASH stands for "Dietary Approaches to Stop Hypertension." The DASH eating plan is a healthy eating plan that has been shown to reduce high blood pressure (hypertension). It may also reduce your risk for type 2 diabetes, heart disease, and stroke. The DASH eating plan may also help with weight loss. What are tips for following this plan? General guidelines  Avoid eating more than 2,300 mg (milligrams) of salt (sodium) a day. If you have hypertension, you may need to reduce your sodium intake to 1,500 mg a day.  Limit alcohol intake to no more than 1 drink a day for nonpregnant women and 2 drinks  a day for men. One drink equals 12 oz of beer, 5 oz of wine, or 1 oz of hard liquor.  Work with your health care provider to maintain a healthy body weight or to lose weight. Ask what an ideal weight is for you.  Get at least 30 minutes of exercise that causes your heart to beat faster (aerobic exercise) most days of the week. Activities may include walking, swimming, or biking.  Work with your health care provider or diet and nutrition specialist (dietitian) to adjust your eating plan to your individual calorie needs. Reading food labels  Check food labels for the amount of sodium per serving. Choose foods with less than 5 percent of the Daily Value of sodium. Generally, foods with less than 300 mg of sodium per serving fit into this eating plan.  To find whole grains, look for the word "whole" as the first word in the ingredient list. Shopping  Buy products labeled as "low-sodium" or "no salt added."  Buy fresh foods. Avoid canned foods and premade or frozen meals. Cooking  Avoid adding salt when cooking. Use salt-free seasonings or herbs instead of table salt or sea salt. Check with your health care provider or pharmacist before using salt substitutes.  Do not fry foods. Cook foods using healthy methods such as baking, boiling, grilling, and broiling instead.  Cook with heart-healthy oils, such as olive, canola, soybean, or sunflower oil. Meal planning   Eat a balanced diet that includes: ?  5 or more servings of fruits and vegetables each day. At each meal, try to fill half of your plate with fruits and vegetables. ? Up to 6-8 servings of whole grains each day. ? Less than 6 oz of lean meat, poultry, or fish each day. A 3-oz serving of meat is about the same size as a deck of cards. One egg equals 1 oz. ? 2 servings of low-fat dairy each day. ? A serving of nuts, seeds, or beans 5 times each week. ? Heart-healthy fats. Healthy fats called Omega-3 fatty acids are found in foods  such as flaxseeds and coldwater fish, like sardines, salmon, and mackerel.  Limit how much you eat of the following: ? Canned or prepackaged foods. ? Food that is high in trans fat, such as fried foods. ? Food that is high in saturated fat, such as fatty meat. ? Sweets, desserts, sugary drinks, and other foods with added sugar. ? Full-fat dairy products.  Do not salt foods before eating.  Try to eat at least 2 vegetarian meals each week.  Eat more home-cooked food and less restaurant, buffet, and fast food.  When eating at a restaurant, ask that your food be prepared with less salt or no salt, if possible. What foods are recommended? The items listed may not be a complete list. Talk with your dietitian about what dietary choices are best for you. Grains Whole-grain or whole-wheat bread. Whole-grain or whole-wheat pasta. Brown rice. [place]. Bulgur. Whole-grain and low-sodium cereals. Pita bread. Low-fat, low-sodium crackers. Whole-wheat flour tortillas. Vegetables Fresh or frozen vegetables (raw, steamed, roasted, or grilled). Low-sodium or reduced-sodium tomato and vegetable juice. Low-sodium or reduced-sodium tomato sauce and tomato paste. Low-sodium or reduced-sodium canned vegetables. Fruits All fresh, dried, or frozen fruit. Canned fruit in natural juice (without added sugar). Meat and other protein foods Skinless chicken or [place]. Ground chicken or [place]. Pork with fat trimmed off. Fish and seafood. Egg whites. Dried beans, peas, or lentils. Unsalted nuts, nut butters, and seeds. Unsalted canned beans. Lean cuts of beef with fat trimmed off. Low-sodium, lean deli meat. Dairy Low-fat (1%) or fat-free (skim) milk. Fat-free, low-fat, or reduced-fat cheeses. Nonfat, low-sodium ricotta or cottage cheese. Low-fat or nonfat yogurt. Low-fat, low-sodium cheese. Fats and oils Soft margarine without trans fats. Vegetable oil. Low-fat, reduced-fat, or light mayonnaise and salad  dressings (reduced-sodium). Canola, safflower, olive, soybean, and sunflower oils. Avocado. Seasoning and other foods Herbs. Spices. Seasoning mixes without salt. Unsalted popcorn and pretzels. Fat-free sweets. What foods are not recommended? The items listed may not be a complete list. Talk with your dietitian about what dietary choices are best for you. Grains Baked goods made with fat, such as croissants, muffins, or some breads. Dry pasta or rice meal packs. Vegetables Creamed or fried vegetables. Vegetables in a cheese sauce. Regular canned vegetables (not low-sodium or reduced-sodium). Regular canned tomato sauce and paste (not low-sodium or reduced-sodium). Regular tomato and vegetable juice (not low-sodium or reduced-sodium). Angie Fava. Olives. Fruits Canned fruit in a light or heavy syrup. Fried fruit. Fruit in cream or butter sauce. Meat and other protein foods Fatty cuts of meat. Ribs. Fried meat. Berniece Salines. Sausage. Bologna and other processed lunch meats. Salami. Fatback. Hotdogs. Bratwurst. Salted nuts and seeds. Canned beans with added salt. Canned or smoked fish. Whole eggs or egg yolks. Chicken or [place] with skin. Dairy Whole or 2% milk, cream, and half-and-half. Whole or full-fat cream cheese. Whole-fat or sweetened yogurt. Full-fat cheese. Nondairy creamers. Whipped toppings. Processed  cheese and cheese spreads. Fats and oils Butter. Stick margarine. Lard. Shortening. Ghee. Bacon fat. Tropical oils, such as coconut, palm kernel, or palm oil. Seasoning and other foods Salted popcorn and pretzels. Onion salt, garlic salt, seasoned salt, table salt, and sea salt. Worcestershire sauce. Tartar sauce. Barbecue sauce. Teriyaki sauce. Soy sauce, including reduced-sodium. Steak sauce. Canned and packaged gravies. Fish sauce. Oyster sauce. Cocktail sauce. Horseradish that you find on the shelf. Ketchup. Mustard. Meat flavorings and tenderizers. Bouillon cubes. Hot sauce and Tabasco sauce.  Premade or packaged marinades. Premade or packaged taco seasonings. Relishes. Regular salad dressings. Where to find more information:  National Heart, Lung, and [place]: https://wilson-eaton.com/  American Heart Association: www.heart.org Summary  The DASH eating plan is a healthy eating plan that has been shown to reduce high blood pressure (hypertension). It may also reduce your risk for type 2 diabetes, heart disease, and stroke.  With the DASH eating plan, you should limit salt (sodium) intake to 2,300 mg a day. If you have hypertension, you may need to reduce your sodium intake to 1,500 mg a day.  When on the DASH eating plan, aim to eat more fresh fruits and vegetables, whole grains, lean proteins, low-fat dairy, and heart-healthy fats.  Work with your health care provider or diet and nutrition specialist (dietitian) to adjust your eating plan to your individual calorie needs. This information is not intended to replace advice given to you by your health care provider. Make sure you discuss any questions you have with your health care provider. Document Released: 09/16/2011 Document Revised: 09/20/2016 Document Reviewed: 09/20/2016 Elsevier Interactive Patient Education  2017 [employer].

## 2017-04-11 NOTE — Telephone Encounter (Signed)
Late Entry: 6/28 Fax came in today from [facility] for the patient saying the test had been cancelled for the reason of Insurance/Financial Responsibility of the patient.  Patient called today stating she has changed her mind and has decided to have the sleep study done after her primary doctor has encouraged her to have the sleep study done. The Cpap assistant Gae Bon called [facility] and asked them to reach back out to the patient for rescheduling. Reached out to the patient gave her the number to [facility] [phone]) patient was grateful for the call and stated she would call [facility].

## 2017-04-12 ENCOUNTER — Telehealth: Payer: Self-pay

## 2017-04-12 DIAGNOSIS — I253 Aneurysm of heart: Secondary | ICD-10-CM

## 2017-04-12 DIAGNOSIS — I517 Cardiomegaly: Secondary | ICD-10-CM

## 2017-04-12 NOTE — Telephone Encounter (Signed)
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 Please get cardiac MRI with gad to evaluate for HOCM  Informed patient of results and verbal understanding expressed.  Cardiac MRI ordered for scheduling. Patient agrees with treatment plan.

## 2017-04-15 ENCOUNTER — Telehealth: Payer: Self-pay | Admitting: Cardiology

## 2017-04-15 NOTE — Telephone Encounter (Signed)
Reiterated to patient ECHO results and the need to have MRI to better visualize muscle thickness and the mitral valve. She understands she will be called to schedule the test after the precert process is complete. She was grateful for call.

## 2017-04-15 NOTE — Telephone Encounter (Signed)
New message    Pt is calling asking for a call back from RN. She would like to speak about why she needs a CT scan.

## 2017-04-18 ENCOUNTER — Encounter: Payer: Self-pay | Admitting: Cardiology

## 2017-04-25 MED FILL — ZOLPIDEM TARTRATE 10 MG TAB: 10 | 30 days supply | Qty: 15 | Fill #1

## 2017-04-25 MED FILL — oxyCODONE HCL 5 MG TABS: 5 | 1 days supply | Qty: 20 | Fill #0

## 2017-04-29 ENCOUNTER — Telehealth: Payer: Self-pay | Admitting: *Deleted

## 2017-04-29 NOTE — Telephone Encounter (Signed)
-----   Message from Theodoro Parma, RN sent at 04/26/2017  4:12 PM EDT ----- Please follow up with this patient. I don't see if she ever got it done.  Thanks!

## 2017-04-29 NOTE — Telephone Encounter (Signed)
Her test dates are scheduled for 7/9 and 7/10.

## 2017-05-03 ENCOUNTER — Telehealth: Payer: Self-pay | Admitting: Cardiology

## 2017-05-03 ENCOUNTER — Encounter: Payer: Self-pay | Admitting: Cardiology

## 2017-05-03 NOTE — Telephone Encounter (Signed)
Called and left a message with daughter regarding cardiac MRI scheduled for 05-10-17 at 12:00 noon.  Letter mailed to the patient today and nurse notified.

## 2017-05-04 MED FILL — ONDANSETRON ODT 4 MG TABLET: 4 | 5 days supply | Qty: 20 | Fill #0

## 2017-05-06 MED FILL — HYDROCODON-APAP 5-325: 5-325 | 8 days supply | Qty: 20 | Fill #0

## 2017-05-09 NOTE — Telephone Encounter (Signed)
Informed patient of sleep study results and patient understanding was verbalized. Patient understands she will be contacted by [facility] to set up her cpap. She understands to call if [facility] does not contact her with new setup in a timely manner. She understands she will be called once confirmation has been received from [facility] that she has received her new machine to schedule 10 week follow up appointment.  [facility] notified of new cpap order in epic Please add to Laura Poole She was grateful for the call and thanked me

## 2017-05-10 ENCOUNTER — Ambulatory Visit (HOSPITAL_COMMUNITY)
Admission: RE | Admit: 2017-05-10 | Discharge: 2017-05-10 | Disposition: A | Discharge status: Home / Self Care | Payer: BLUE CROSS/BLUE SHIELD | Source: Ambulatory Visit | Attending: Cardiology | Admitting: Cardiology

## 2017-05-10 ENCOUNTER — Telehealth: Payer: Self-pay | Admitting: *Deleted

## 2017-05-10 DIAGNOSIS — I422 Other hypertrophic cardiomyopathy: Secondary | ICD-10-CM | POA: Diagnosis not present

## 2017-05-10 DIAGNOSIS — I253 Aneurysm of heart: Secondary | ICD-10-CM | POA: Diagnosis present

## 2017-05-10 DIAGNOSIS — I517 Cardiomegaly: Secondary | ICD-10-CM | POA: Diagnosis present

## 2017-05-10 DIAGNOSIS — G4733 Obstructive sleep apnea (adult) (pediatric): Secondary | ICD-10-CM

## 2017-05-10 MED ORDER — GADOBENATE DIMEGLUMINE 529 MG/ML IV SOLN
30.0000 mL | Freq: Once | INTRAVENOUS | Status: AC
  Administered 2017-05-10: 27 mL via INTRAVENOUS

## 2017-05-10 NOTE — Telephone Encounter (Signed)
-----   Message from Sueanne Margarita, MD sent at 05/06/2017  1:01 PM EDT ----- Please let patient know that she has mild OSA with some oxygen desaturations - please set up home CPAP titration from 4 to 18cm H2O with ResMed CPAP with heated humidity and mask of choice and followup with me in 10 weeks

## 2017-05-10 NOTE — Telephone Encounter (Signed)
Informed patient of sleep study results and patient understanding was verbalized. Patient understands she has mild OSA and her oxygen drops some. Patient understands Dr Radford Pax has ordered a home CPAP titration from 4 to 18 cm H20 with heated humidity and mask of choice and follow up with her in 10 weeks. Patient agrees with treatment and thanked me for the call.   [place] notified of new order Please add to air view

## 2017-05-20 ENCOUNTER — Telehealth: Payer: Self-pay | Admitting: *Deleted

## 2017-05-20 NOTE — Telephone Encounter (Signed)
-----   Message from Sueanne Margarita, MD sent at 05/18/2017  1:22 PM EDT ----- Please let patient know that she has mild OSA with AHI 10.8/hr.  Please set up a home CPAP titration with a ResMed CPAP from 4 to 20cm H2O with heated humidity and mask of choice.  Followup with me in 12 weeks

## 2017-05-20 NOTE — Telephone Encounter (Deleted)
-----   Message from Sueanne Margarita, MD sent at 05/18/2017  1:22 PM EDT ----- Please let patient know that she has mild OSA with AHI 10.8/hr.  Please set up a home CPAP titration with a ResMed CPAP from 4 to 20cm H2O with heated humidity and mask of choice.  Followup with me in 12 weeks

## 2017-05-20 NOTE — Telephone Encounter (Signed)
Informed patient of sleep study results and patient understanding was verbalized. Patient understands she has mild OSA and stopped breathing 10.8 /hr. Patient states she is not able to get to [facility] to pick up her CPAP for 4 to 6 weeks due to having surgery. Patient will notify the cpap assistant when she is released from the doctor and is able to do the titration. Patient was grateful for the call and thanked me.

## 2017-05-26 MED FILL — ZOLPIDEM TARTRATE 10 MG TAB: 10 | 30 days supply | Qty: 15 | Fill #2

## 2017-06-24 MED FILL — ZOLPIDEM TARTRATE 10 MG TAB: 10 | 30 days supply | Qty: 15 | Fill #3

## 2017-07-08 ENCOUNTER — Other Ambulatory Visit: Payer: Self-pay | Admitting: Obstetrics and Gynecology

## 2017-07-08 DIAGNOSIS — Z1231 Encounter for screening mammogram for malignant neoplasm of breast: Secondary | ICD-10-CM

## 2017-07-22 ENCOUNTER — Other Ambulatory Visit: Payer: Self-pay | Admitting: Cardiology

## 2017-07-22 NOTE — Telephone Encounter (Signed)
Patient states she is not able to get to [facility] to pick up her CPAP for 4 to 6 weeks due to having surgery.  Patient will notify the cpap assistant when she is released from the doctor and is able to do the titration.  Patient was grateful for the call and thanked me.

## 2017-07-22 NOTE — Telephone Encounter (Signed)
Called patient to follow up on whether she has been released from the doctor to move forward with the CPAP Titration but there was no answer so I LMTCB.

## 2017-07-22 NOTE — Telephone Encounter (Signed)
Return call: Patient called back to say she is now ready to do her CPAP Titration. [facility] notified of order in EPIC. Please add to air view

## 2017-07-25 ENCOUNTER — Telehealth: Payer: Self-pay | Admitting: Cardiology

## 2017-07-25 ENCOUNTER — Ambulatory Visit
Admission: RE | Admit: 2017-07-25 | Discharge: 2017-07-25 | Disposition: A | Discharge status: Home / Self Care | Payer: BLUE CROSS/BLUE SHIELD | Source: Ambulatory Visit | Attending: Obstetrics and Gynecology | Admitting: Obstetrics and Gynecology

## 2017-07-25 DIAGNOSIS — Z1231 Encounter for screening mammogram for malignant neoplasm of breast: Secondary | ICD-10-CM

## 2017-07-25 HISTORY — DX: Personal history of irradiation: Z92.3

## 2017-07-25 HISTORY — DX: Personal history of antineoplastic chemotherapy: Z92.21

## 2017-07-25 MED ORDER — ZOLPIDEM TARTRATE 10 MG PO TABS: ORAL_TABLET | ORAL | 3 refills | Status: DC

## 2017-07-25 MED ORDER — ZOLPIDEM TARTRATE 10 MG PO TABS: ORAL_TABLET | ORAL | 2 refills | Status: DC

## 2017-07-25 MED FILL — ZOLPIDEM TARTRATE 10 MG TAB: 10 | 30 days supply | Qty: 15 | Fill #0

## 2017-07-25 NOTE — Telephone Encounter (Signed)
Pt's medication was faxed to [facility] pharmacy as requested. Confirmation received.

## 2017-07-25 NOTE — Telephone Encounter (Signed)
Ok to refill Ambien 5mg  qhs PRN (#15) with 2 refills

## 2017-07-25 NOTE — Telephone Encounter (Signed)
[facility] pharmacy is requesting a refill on Zolpidem 10 mg tablet. Would you like to refill this medication? Please advise

## 2017-07-25 NOTE — Telephone Encounter (Signed)
I will forward to Dr Radford Pax for review.

## 2017-07-29 ENCOUNTER — Encounter: Payer: Self-pay | Admitting: Nurse Practitioner

## 2017-07-29 ENCOUNTER — Ambulatory Visit (INDEPENDENT_AMBULATORY_CARE_PROVIDER_SITE_OTHER): Payer: BLUE CROSS/BLUE SHIELD | Admitting: Nurse Practitioner

## 2017-07-29 VITALS — BP 130/86 | HR 82 | Temp 97.8°F | Ht 67.75 in | Wt 181.0 lb

## 2017-07-29 DIAGNOSIS — I10 Essential (primary) hypertension: Secondary | ICD-10-CM

## 2017-07-29 DIAGNOSIS — M7989 Other specified soft tissue disorders: Secondary | ICD-10-CM

## 2017-07-29 DIAGNOSIS — Z23 Encounter for immunization: Secondary | ICD-10-CM | POA: Diagnosis not present

## 2017-07-29 MED ORDER — FUROSEMIDE 20 MG PO TABS: ORAL_TABLET | ORAL | 0 refills | Status: DC

## 2017-07-29 MED FILL — FUROSEMIDE 20 MG TABLET: 20 | 40 days supply | Qty: 20 | Fill #0

## 2017-07-29 NOTE — Patient Instructions (Addendum)
Edema is due to high salt and medication side effect.  Maintain low salt diet.  Wear knee high compression stocking during the day and off at night.  Use furosemide twice a week as needed for leg swelling.

## 2017-07-29 NOTE — Progress Notes (Signed)
Subjective:  Patient ID: Laura Poole, female    DOB: 1951/02/02  Age: 66 y.o. MRN: [id number]  CC: Leg Swelling (legs swelling everyday before surgery, had both feeth surgery/ med conuslt. flu shot?)  HPI  Bilateral LE swelling: Onset over 1year ago. Intermittent. Waxing and waning Worse with prolong standing and sitting. No new medication, no new dosage. Does not maintain low salt diet. No exercise. No SOB, no CP, no dizziness, no leg pain, no PND, no palpitations.  Outpatient Medications Prior to Visit  Medication Sig Dispense Refill  . acetaminophen (TYLENOL) 500 MG tablet Take 500 mg by mouth every 6 (six) hours as needed for mild pain or headache.    [name] amLODipine (NORVASC) 10 MG tablet Take 1 tablet (10 mg total) by mouth daily. 90 tablet 3  . aspirin 81 MG tablet Take 1 tablet (81 mg total) by mouth daily. 30 tablet 6  . Coenzyme Q10 (CO Q 10) 100 MG CAPS Take 1 capsule by mouth every morning.    . diphenhydrAMINE (BENADRYL) 25 mg capsule Take 25 mg by mouth every 6 (six) hours as needed for allergies.    . fluticasone (FLONASE) 50 MCG/ACT nasal spray Place 1 spray into both nostrils 2 (two) times daily as needed for allergies or rhinitis. 16 g 2  . labetalol (NORMODYNE) 300 MG tablet Take 1 tablet (300 mg total) by mouth 2 (two) times daily. 180 tablet 3  . magnesium oxide (MAG-OX) 400 MG tablet 1 tablet by mouth at bedtime    . spironolactone (ALDACTONE) 25 MG tablet TAKE 1 TABLET BY MOUTH ONCE DAILY IN PLACE OF GENERIC DYAZIDE AND POTASSIUM SUPPLEMENT 90 tablet 3  . tiZANidine (ZANAFLEX) 4 MG tablet TAKE 1 TABLET BY MOUTH 3 TIMES DAILY. 90 tablet 1  . zolpidem (AMBIEN) 10 MG tablet TAKE 1/2 TABLET (5 MG) BY MOUTH AT BEDTIME AS NEEDED FOR SLEEP 15 tablet 2  . pantoprazole (PROTONIX) 40 MG tablet Take 1 tablet (40 mg total) by mouth 2 (two) times daily. 60 tablet 11   No facility-administered medications prior to visit.     ROS See HPI  Objective:  BP 130/86   Pulse  82   Temp 97.8 F (36.6 C)   Ht 5' 7.75" (1.721 m)   Wt 181 lb (82.1 kg)   SpO2 99%   BMI 27.72 kg/m   BP Readings from Last 3 Encounters:  07/29/17 130/86  04/11/17 (!) 142/88  03/30/17 130/70    Wt Readings from Last 3 Encounters:  07/29/17 181 lb (82.1 kg)  04/11/17 178 lb 6.4 oz (80.9 kg)  03/30/17 181 lb (82.1 kg)    Physical Exam  Constitutional: She is oriented to person, place, and time. No distress.  Cardiovascular: Normal rate and regular rhythm.   Murmur heard. Pulmonary/Chest: Effort normal and breath sounds normal.  Musculoskeletal: She exhibits edema.  Trace ankle and feet edema (non pitting)  Neurological: She is alert and oriented to person, place, and time.  Skin: Skin is warm and dry. No rash noted. No erythema.  Psychiatric: She has a normal mood and affect. Her behavior is normal.  Vitals reviewed.   Lab Results  Component Value Date   WBC 6.5 04/11/2017   HGB 12.2 04/11/2017   HCT 37.7 04/11/2017   PLT 312.0 04/11/2017   GLUCOSE 89 04/11/2017   CHOL 147 11/04/2015   TRIG 80 11/04/2015   HDL 65 11/04/2015   LDLCALC 66 11/04/2015   ALT 14 04/11/2017  AST 13 04/11/2017   NA 142 04/11/2017   K 3.9 04/11/2017   CL 104 04/11/2017   CREATININE 0.84 04/11/2017   BUN 8 04/11/2017   CO2 30 04/11/2017   TSH 1.78 04/12/2016   INR 1.1 (H) 01/25/2012   HGBA1C 6.3 05/16/2015    Mm Digital Screening Bilateral  Result Date: 07/26/2017 CLINICAL DATA:  Screening. History of left breast cancer in 2006 status post lumpectomy and radiation therapy. EXAM: DIGITAL SCREENING BILATERAL MAMMOGRAM WITH CAD COMPARISON:  Previous exam(s). ACR Breast Density Category b: There are scattered areas of fibroglandular density. FINDINGS: There are stable postsurgical changes within the left breast. There are no findings suspicious for malignancy within either breast. Images were processed with CAD. IMPRESSION: No mammographic evidence of malignancy. A result letter of  this screening mammogram will be mailed directly to the patient. RECOMMENDATION: Screening mammogram in one year. (Code:SM-B-01Y) BI-RADS CATEGORY  2: Benign. Electronically Signed   By: Franki Cabot M.D.   On: 07/26/2017 08:32    Assessment & Plan:   Keylani was seen today for leg swelling.  Diagnoses and all orders for this visit:  Essential hypertension -     furosemide (LASIX) 20 MG tablet; Take every other day as needed for leg swelling  Leg swelling -     furosemide (LASIX) 20 MG tablet; Take every other day as needed for leg swelling  Need for influenza vaccination -     Flu vaccine HIGH DOSE PF   I am having Ms. Barnhill start on furosemide. I am also having her maintain her aspirin, magnesium oxide, fluticasone, diphenhydrAMINE, Co Q 10, acetaminophen, tiZANidine, pantoprazole, labetalol, amLODipine, spironolactone, and zolpidem.  Meds ordered this encounter  Medications  . furosemide (LASIX) 20 MG tablet    Sig: Take every other day as needed for leg swelling    Dispense:  20 tablet    Refill:  0    Order Specific Question:   Supervising Provider    Answer:   Binnie Rail [[id number]]    Follow-up: Return if symptoms worsen or fail to improve.  Wilfred Lacy, NP

## 2017-08-12 MED FILL — GABAPENTIN 100 MG CAPS: 100 | 33 days supply | Qty: 90 | Fill #0

## 2017-08-16 NOTE — Telephone Encounter (Signed)
Patient called in to say she could not afford to get her CPAP at this time. [facility] says she has met her deductible for this year but the 90 day process will run into next year and they can not start the process until she gave them her bank account information to secure the payment for next year. Patient says [facility] could not tell her what the payment each month would be so she declined to get the CPAP because she of her financial status.

## 2017-08-26 MED FILL — ZOLPIDEM TARTRATE 10 MG TAB: 10 | 30 days supply | Qty: 15 | Fill #1

## 2017-09-16 ENCOUNTER — Ambulatory Visit: Payer: BLUE CROSS/BLUE SHIELD | Admitting: Nurse Practitioner

## 2017-09-16 ENCOUNTER — Encounter: Payer: Self-pay | Admitting: Nurse Practitioner

## 2017-09-16 VITALS — BP 124/80 | HR 94 | Temp 98.4°F | Resp 16 | Ht 67.75 in | Wt 181.0 lb

## 2017-09-16 DIAGNOSIS — J40 Bronchitis, not specified as acute or chronic: Secondary | ICD-10-CM

## 2017-09-16 DIAGNOSIS — I10 Essential (primary) hypertension: Secondary | ICD-10-CM

## 2017-09-16 MED ORDER — AZITHROMYCIN 250 MG PO TABS: ORAL_TABLET | ORAL | 0 refills | Status: DC

## 2017-09-16 MED FILL — AZITHROMYCIN 250 MG TAB: 250 | 5 days supply | Qty: 6 | Fill #0

## 2017-09-16 NOTE — Progress Notes (Signed)
Subjective:    Patient ID: Laura Poole, female    DOB: 1951-01-12, 66 y.o.   MRN: [id number]  HPI Laura Poole is a 66 yo female who presents today to establish care. She is transferring to me from another provider in the same clinic.  Cough- this is a new problem. The problem began about 2 weeks ago. The cough is productive with clear mucous. The cough has not improved since onset. She reports fatigue, headache, nasal congestion, rhinorrhea, sneezing, sore throat. She denies fevers, chills, body aches, wheezing, sleep difficulty. She has tried Copywriter, advertising plus and ibuprofen with some relief  Hypertension- maintained on labetolol, amlodipine. She reports taking the medications daily as prescribed. She occasionally checks her blood pressure at home if she feels it is high but has not felt so recently. She denies vision changes, chest pain, shortness of breath. She does have some intermittent LE edema for which she takes lasix as needed and wears compression stockings. She also notices drowsiness after labetolol so she now takes it at night which has improved her tolerance of the medication  BP Readings from Last 3 Encounters:  09/16/17 124/80  07/29/17 130/86  04/11/17 (!) 142/88    Review of Systems  See HPI  Past Medical History:  Diagnosis Date  . Chest pain   . GERD (gastroesophageal reflux disease)   . H/O: hysterectomy   . History of breast cancer 2006  . History of colonic polyps   . HLD (hyperlipidemia)   . HTN (hypertension)   . Personal history of chemotherapy 2006   Left Breast Cancer  . Personal history of radiation therapy 2006   Left Breast Cancer     Social History   Socioeconomic History  . Marital status: Single    Spouse name: Not on file  . Number of children: Not on file  . Years of education: Not on file  . Highest education level: Not on file  Social Needs  . Financial resource strain: Not on file  . Food insecurity - worry: Not on file  . Food  insecurity - inability: Not on file  . Transportation needs - medical: Not on file  . Transportation needs - non-medical: Not on file  Occupational History  . Not on file  Tobacco Use  . Smoking status: Never Smoker  . Smokeless tobacco: Never Used  Substance and Sexual Activity  . Alcohol use: Yes    Alcohol/week: 1.2 oz    Types: 2 Cans of beer per week    Comment: rarely  . Drug use: No  . Sexual activity: Not on file  Other Topics Concern  . Not on file  Social History Narrative  . Not on file    Past Surgical History:  Procedure Laterality Date  . ABDOMINAL HYSTERECTOMY    . BREAST LUMPECTOMY Left 2006  . COLONOSCOPY W/ POLYPECTOMY  2004, 2009   Dr Watt Climes  . TUBAL LIGATION      Family History  Problem Relation Age of Onset  . Heart attack Father 25  . Hypertension Mother   . Coronary artery disease Mother   . Aneurysm Brother   . Asthma Other        nephew  . Hyperlipidemia Other        nephew  . Sudden death Other        nephew  . Breast cancer Paternal Grandmother   . Colon cancer Neg Hx     No Known Allergies  Current Outpatient  Medications on File Prior to Visit  Medication Sig Dispense Refill  . acetaminophen (TYLENOL) 500 MG tablet Take 500 mg by mouth every 6 (six) hours as needed for mild pain or headache.    [name] amLODipine (NORVASC) 10 MG tablet Take 1 tablet (10 mg total) by mouth daily. 90 tablet 3  . aspirin 81 MG tablet Take 1 tablet (81 mg total) by mouth daily. 30 tablet 6  . Coenzyme Q10 (CO Q 10) 100 MG CAPS Take 1 capsule by mouth every morning.    . diphenhydrAMINE (BENADRYL) 25 mg capsule Take 25 mg by mouth every 6 (six) hours as needed for allergies.    . fluticasone (FLONASE) 50 MCG/ACT nasal spray Place 1 spray into both nostrils 2 (two) times daily as needed for allergies or rhinitis. 16 g 2  . furosemide (LASIX) 20 MG tablet Take every other day as needed for leg swelling 20 tablet 0  . gabapentin (NEURONTIN) 100 MG capsule Take 100  mg by mouth 3 (three) times daily.    [name] labetalol (NORMODYNE) 300 MG tablet Take 1 tablet (300 mg total) by mouth 2 (two) times daily. 180 tablet 3  . magnesium oxide (MAG-OX) 400 MG tablet 1 tablet by mouth at bedtime    . spironolactone (ALDACTONE) 25 MG tablet TAKE 1 TABLET BY MOUTH ONCE DAILY IN PLACE OF GENERIC DYAZIDE AND POTASSIUM SUPPLEMENT 90 tablet 3  . tiZANidine (ZANAFLEX) 4 MG tablet TAKE 1 TABLET BY MOUTH 3 TIMES DAILY. 90 tablet 1  . vitamin C (ASCORBIC ACID) 500 MG tablet Take 500 mg by mouth daily.    [name] zolpidem (AMBIEN) 10 MG tablet TAKE 1/2 TABLET (5 MG) BY MOUTH AT BEDTIME AS NEEDED FOR SLEEP 15 tablet 2  . pantoprazole (PROTONIX) 40 MG tablet Take 1 tablet (40 mg total) by mouth 2 (two) times daily. 60 tablet 11   No current facility-administered medications on file prior to visit.     BP 124/80 (BP Location: Left Arm, Patient Position: Sitting, Cuff Size: Large)   Pulse 94   Temp 98.4 F (36.9 C) (Oral)   Resp 16   Ht 5' 7.75" (1.721 m)   Wt 181 lb (82.1 kg)   SpO2 98%   BMI 27.72 kg/m       Objective:   Physical Exam  Constitutional: She is oriented to person, place, and time. She appears well-developed and well-nourished. No distress.  HENT:  Head: Normocephalic and atraumatic.  Right Ear: External ear and ear canal normal. No drainage. Tympanic membrane is bulging.  Left Ear: External ear and ear canal normal. No drainage. Tympanic membrane is bulging.  Nose: Rhinorrhea present.  Mouth/Throat: Uvula is midline, oropharynx is clear and moist and mucous membranes are normal.  Cardiovascular: Normal rate, regular rhythm and intact distal pulses.  Pulmonary/Chest: Effort normal and breath sounds normal. No respiratory distress. She has no wheezes.  Musculoskeletal: She exhibits no edema.  Neurological: She is alert and oriented to person, place, and time. Coordination normal.  Skin: Skin is warm and dry.  Psychiatric: She has a normal mood and affect.  Judgment and thought content normal.      Assessment & Plan:  Bronchitis Will treat with antibiotics due to duration of symptoms and fatigue. - azithromycin (ZITHROMAX) 250 MG tablet; Take 2 tablets today, then 1 tablet every day until finished.  Dispense: 6 tablet; Refill: 0 Return precautions given.

## 2017-09-16 NOTE — Assessment & Plan Note (Signed)
Blood pressure maintained below current goal of 140/90 on labetalol and amlodipine .  She does have some LE edema which is treated with lasix prn and compression tights Encouraged maintenance of healthy, reduced sodium diet. She'll continue current dosage of medications. Follow up in about 6 months. CMET on 04/11/17 wnl

## 2017-09-16 NOTE — Patient Instructions (Addendum)
Please start azithromycin 250mg  2 tablets today, then 1 tablet every day on days 2-5. Please complete entire course.  Please follow up if you develop fevers over 101, your symptoms worsen, or if no improvement by Monday.  Id like to see you back in about 6 months for a routine blood pressure follow up, and also for an annual physical at your convenience.   I hope you feel better. Thanks for letting me take care of you today :)   Acute Bronchitis, Adult Acute bronchitis is when air tubes (bronchi) in the lungs suddenly get swollen. The condition can make it hard to breathe. It can also cause these symptoms:  A cough.  Coughing up clear, yellow, or green mucus.  Wheezing.  Chest congestion.  Shortness of breath.  A fever.  Body aches.  Chills.  A sore throat.  Follow these instructions at home: Medicines  Take over-the-counter and prescription medicines only as told by your doctor.  If you were prescribed an antibiotic medicine, take it as told by your doctor. Do not stop taking the antibiotic even if you start to feel better. General instructions  Rest.  Drink enough fluids to keep your pee (urine) clear or pale yellow.  Avoid smoking and secondhand smoke. If you smoke and you need help quitting, ask your doctor. Quitting will help your lungs heal faster.  Use an inhaler, cool mist vaporizer, or humidifier as told by your doctor.  Keep all follow-up visits as told by your doctor. This is important. How is this prevented? To lower your risk of getting this condition again:  Wash your hands often with soap and water. If you cannot use soap and water, use hand sanitizer.  Avoid contact with people who have cold symptoms.  Try not to touch your hands to your mouth, nose, or eyes.  Make sure to get the flu shot every year.  Contact a doctor if:  Your symptoms do not get better in 2 weeks. Get help right away if:  You cough up blood.  You have chest  pain.  You have very bad shortness of breath.  You become dehydrated.  You faint (pass out) or keep feeling like you are going to pass out.  You keep throwing up (vomiting).  You have a very bad headache.  Your fever or chills gets worse. This information is not intended to replace advice given to you by your health care provider. Make sure you discuss any questions you have with your health care provider. Document Released: 03/15/2008 Document Revised: 05/05/2016 Document Reviewed: 03/17/2016 Elsevier Interactive Patient Education  2017 [employer].

## 2017-09-23 ENCOUNTER — Other Ambulatory Visit: Payer: Self-pay | Admitting: Gastroenterology

## 2017-09-23 MED FILL — PANTOPRAZOLE SOD DR 40 MG T: 40 | 30 days supply | Qty: 60 | Fill #0 | Status: TO

## 2017-09-23 MED FILL — ZOLPIDEM TARTRATE 10 MG TAB: 10 | 30 days supply | Qty: 15 | Fill #2

## 2017-09-23 MED FILL — LABETALOL HCL 300 MG TABLET: 300 | 30 days supply | Qty: 60 | Fill #1

## 2017-09-28 ENCOUNTER — Other Ambulatory Visit: Payer: Self-pay

## 2017-09-28 MED ORDER — TIZANIDINE HCL 4 MG PO TABS: 4.0000 mg | ORAL_TABLET | Freq: Three times a day (TID) | ORAL | 0 refills | Status: DC

## 2017-09-28 MED FILL — tiZANidine HCL 4 MG TABS: 4 | 30 days supply | Qty: 90 | Fill #0

## 2017-10-24 ENCOUNTER — Other Ambulatory Visit: Payer: Self-pay | Admitting: Cardiology

## 2017-10-25 MED FILL — ZOLPIDEM TARTRATE 10 MG TAB: 10 | 30 days supply | Qty: 15 | Fill #0

## 2017-11-03 DIAGNOSIS — M7741 Metatarsalgia, right foot: Secondary | ICD-10-CM | POA: Insufficient documentation

## 2017-11-24 MED FILL — SPIRONOLACTONE 25 MG TABLET: 25 | 90 days supply | Qty: 90 | Fill #1

## 2017-11-24 MED FILL — LABETALOL HCL 300 MG TABLET: 300 | 30 days supply | Qty: 60 | Fill #2

## 2017-11-24 MED FILL — ZOLPIDEM TARTRATE 10 MG TAB: 10 | 30 days supply | Qty: 15 | Fill #1

## 2017-11-24 MED FILL — AMLODIPINE BESYLATE 10 MG T: 10 | 90 days supply | Qty: 90 | Fill #1

## 2017-11-24 MED FILL — PANTOPRAZOLE SOD DR 40 MG T: 40 | 30 days supply | Qty: 60 | Fill #1 | Status: TO

## 2017-12-09 ENCOUNTER — Ambulatory Visit: Payer: BLUE CROSS/BLUE SHIELD | Admitting: Adult Health

## 2017-12-09 ENCOUNTER — Encounter: Payer: Self-pay | Admitting: Adult Health

## 2017-12-09 ENCOUNTER — Encounter: Payer: Self-pay | Admitting: Family Medicine

## 2017-12-09 VITALS — BP 158/100 | Temp 100.0°F | Wt 180.0 lb

## 2017-12-09 DIAGNOSIS — R6889 Other general symptoms and signs: Secondary | ICD-10-CM

## 2017-12-09 LAB — POC INFLUENZA A&B (BINAX/QUICKVUE)
INFLUENZA A, POC: NEGATIVE
INFLUENZA B, POC: NEGATIVE

## 2017-12-09 MED ORDER — HYDROCODONE-HOMATROPINE 5-1.5 MG/5ML PO SYRP: 5.0000 mL | ORAL_SOLUTION | Freq: Three times a day (TID) | ORAL | 0 refills | Status: DC | PRN

## 2017-12-09 MED ORDER — PREDNISONE 10 MG PO TABS: 10.0000 mg | ORAL_TABLET | Freq: Every day | ORAL | 0 refills | Status: DC

## 2017-12-09 MED ORDER — OSELTAMIVIR PHOSPHATE 75 MG PO CAPS: 75.0000 mg | ORAL_CAPSULE | Freq: Two times a day (BID) | ORAL | 0 refills | Status: AC

## 2017-12-09 MED FILL — HYDROCODONE-HOMATROPINE SOL: 5-1.5 | 8 days supply | Qty: 120 | Fill #0

## 2017-12-09 MED FILL — OSELTAMIVIR PHOSPHATE 75 MG: 75 | 5 days supply | Qty: 10 | Fill #0

## 2017-12-09 MED FILL — predniSONE 10 MG TABS: 10 | 7 days supply | Qty: 7 | Fill #0

## 2017-12-09 NOTE — Progress Notes (Signed)
Subjective:    Patient ID: Laura Poole, female    DOB: Oct 22, 1950, 67 y.o.   MRN: [id number]  HPI  67 year old female who  has a past medical history of Chest pain, GERD (gastroesophageal reflux disease), H/O: hysterectomy, History of breast cancer (2006), History of colonic polyps, HLD (hyperlipidemia), HTN (hypertension), Personal history of chemotherapy (2006), and Personal history of radiation therapy (2006).  She presents to the office today with the acute complaint of flu like symptoms. Her symptoms started yesterday and include: productive cough, headache, body aches, sore throat, rhinorrhea, vomiting, and diarrhea. Her symptoms have been become worse over the last 12 hours.   She endorses sick contacts with flu.   Review of Systems See HPI   Past Medical History:  Diagnosis Date  . Chest pain   . GERD (gastroesophageal reflux disease)   . H/O: hysterectomy   . History of breast cancer 2006  . History of colonic polyps   . HLD (hyperlipidemia)   . HTN (hypertension)   . Personal history of chemotherapy 2006   Left Breast Cancer  . Personal history of radiation therapy 2006   Left Breast Cancer    Social History   Socioeconomic History  . Marital status: Single    Spouse name: Not on file  . Number of children: Not on file  . Years of education: Not on file  . Highest education level: Not on file  Social Needs  . Financial resource strain: Not on file  . Food insecurity - worry: Not on file  . Food insecurity - inability: Not on file  . Transportation needs - medical: Not on file  . Transportation needs - non-medical: Not on file  Occupational History  . Not on file  Tobacco Use  . Smoking status: Never Smoker  . Smokeless tobacco: Never Used  Substance and Sexual Activity  . Alcohol use: Yes    Alcohol/week: 1.2 oz    Types: 2 Cans of beer per week    Comment: rarely  . Drug use: No  . Sexual activity: Not on file  Other Topics Concern  . Not on  file  Social History Narrative  . Not on file    Past Surgical History:  Procedure Laterality Date  . ABDOMINAL HYSTERECTOMY    . BREAST LUMPECTOMY Left 2006  . COLONOSCOPY W/ POLYPECTOMY  2004, 2009   Dr Watt Climes  . TUBAL LIGATION      Family History  Problem Relation Age of Onset  . Heart attack Father 2  . Hypertension Mother   . Coronary artery disease Mother   . Aneurysm Brother   . Asthma Other        nephew  . Hyperlipidemia Other        nephew  . Sudden death Other        nephew  . Breast cancer Paternal Grandmother   . Colon cancer Neg Hx     No Known Allergies  Current Outpatient Medications on File Prior to Visit  Medication Sig Dispense Refill  . acetaminophen (TYLENOL) 500 MG tablet Take 500 mg by mouth every 6 (six) hours as needed for mild pain or headache.    [name] amLODipine (NORVASC) 10 MG tablet Take 1 tablet (10 mg total) by mouth daily. 90 tablet 3  . aspirin 81 MG tablet Take 1 tablet (81 mg total) by mouth daily. 30 tablet 6  . Coenzyme Q10 (CO Q 10) 100 MG CAPS Take 1 capsule by  mouth every morning.    . diphenhydrAMINE (BENADRYL) 25 mg capsule Take 25 mg by mouth every 6 (six) hours as needed for allergies.    . fluticasone (FLONASE) 50 MCG/ACT nasal spray Place 1 spray into both nostrils 2 (two) times daily as needed for allergies or rhinitis. 16 g 2  . furosemide (LASIX) 20 MG tablet Take every other day as needed for leg swelling 20 tablet 0  . gabapentin (NEURONTIN) 100 MG capsule Take 100 mg by mouth 3 (three) times daily.    [name] labetalol (NORMODYNE) 300 MG tablet Take 1 tablet (300 mg total) by mouth 2 (two) times daily. 180 tablet 3  . magnesium oxide (MAG-OX) 400 MG tablet 1 tablet by mouth at bedtime    . pantoprazole (PROTONIX) 40 MG tablet TAKE 1 TABLET BY MOUTH 2 TIMES DAILY. 60 tablet 11  . spironolactone (ALDACTONE) 25 MG tablet TAKE 1 TABLET BY MOUTH ONCE DAILY IN PLACE OF GENERIC DYAZIDE AND POTASSIUM SUPPLEMENT 90 tablet 3  . tiZANidine  (ZANAFLEX) 4 MG tablet Take 1 tablet (4 mg total) by mouth 3 (three) times daily. 90 tablet 0  . vitamin C (ASCORBIC ACID) 500 MG tablet Take 500 mg by mouth daily.    [name] zolpidem (AMBIEN) 10 MG tablet TAKE 1/2 TABLET (5 MG) BY MOUTH AT BEDTIME AS NEEDED FOR SLEEP 15 tablet 2   No current facility-administered medications on file prior to visit.     BP (!) 158/100 (BP Location: Left Arm)   Temp 100 F (37.8 C) (Oral)   Wt 180 lb (81.6 kg)   BMI 27.57 kg/m       Objective:   Physical Exam  Constitutional: She is oriented to person, place, and time. Vital signs are normal. She appears well-developed and well-nourished. She appears ill. No distress.  Cardiovascular: Normal rate, regular rhythm, normal heart sounds and intact distal pulses. Exam reveals no gallop and no friction rub.  No murmur heard. Pulmonary/Chest: Effort normal and breath sounds normal. No respiratory distress. She has no wheezes. She has no rales.  Abdominal: Soft. Bowel sounds are normal. She exhibits no distension and no mass. There is no tenderness. There is no rebound and no guarding.  Lymphadenopathy:       Head (right side): Tonsillar and preauricular adenopathy present.       Head (left side): Tonsillar and preauricular adenopathy present.  Neurological: She is alert and oriented to person, place, and time.  Skin: Skin is warm and dry. No rash noted. She is not diaphoretic. No erythema. No pallor.  Psychiatric: She has a normal mood and affect. Her behavior is normal. Judgment and thought content normal.  Vitals reviewed.     Assessment & Plan:  1. Flu-like symptoms - Flu negative but will still treat due to S&S.  - POC Influenza A&B(BINAX/QUICKVUE)- negative  - oseltamivir (TAMIFLU) 75 MG capsule; Take 1 capsule (75 mg total) by mouth 2 (two) times daily for 5 days.  Dispense: 10 capsule; Refill: 0 - predniSONE (DELTASONE) 10 MG tablet; Take 1 tablet (10 mg total) by mouth daily with breakfast.  Dispense:  7 tablet; Refill: 0 - HYDROcodone-homatropine (HYCODAN) 5-1.5 MG/5ML syrup; Take 5 mLs by mouth every 8 (eight) hours as needed for cough.  Dispense: 120 mL; Refill: 0 - Follow up with PCP if no improvement in 2-3 days   Dorothyann Peng, NP

## 2017-12-12 ENCOUNTER — Telehealth: Payer: Self-pay | Admitting: *Deleted

## 2017-12-12 NOTE — Telephone Encounter (Signed)
Letter mailed to address on file. FYI regarding medication.

## 2017-12-12 NOTE — Telephone Encounter (Signed)
Copied from [place] [id number]. Topic: General - Other >> Dec 12, 2017  7:34 AM Scherrie Gerlach wrote: Reason for CRM: pt states she lost her work note given to her from [place] at her visit 3/01. Pt would like to know if you could reprint and mail to her home.  Also pt also wants Tommi Rumps to know the cough medicine is too strong and she only taking one time a day.

## 2017-12-14 IMAGING — MR MR CARD MORPHOLOGY WO/W CM
8 of 9 series · 15 of 16 positions shown · IV contrast (multihance)
Comparison: none

CLINICAL DATA: 66-year-old female with LVH on echocardiogram.
Evaluate for a possible hypertrophic cardiomyopathy.

EXAM:
CARDIAC MRI
TECHNIQUE: The patient was scanned on a 1.5 Tesla GE magnet. A dedicated
cardiac coil was used. Functional imaging was done using Fiesta
sequences. [DATE], and 4 chamber views were done to assess for RWMA's.
Modified Johnrex rule using a short axis stack was used to
calculate an ejection fraction on a dedicated work station using
Circle software. The patient received 27 cc of Multihance. After 10
minutes inversion recovery sequences were used to assess for
infiltration and scar tissue.
CONTRAST:  27 cc  of Multihance

[Series 3: bSSFP · sagittal · 8.0mm · 1.33mm/px · 1 of 15 slices shown (1 of 5)]
[im 1/15]
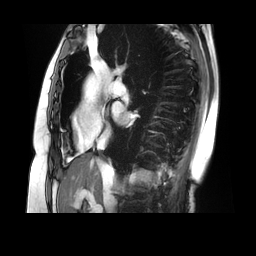

[Series 4: bSSFP · oblique · 8.0mm · 1.33mm/px · 1 of 20 slices shown (2 of 5)]
[im 1/20]
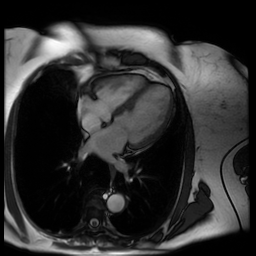

[Series 5: bSSFP · oblique · 8.0mm · 1.41mm/px · 5 of 360 slices shown (3 of 5)]
[im 1/360]
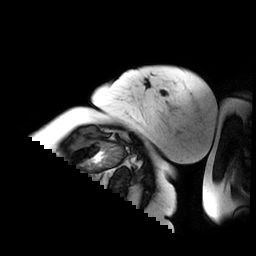
[im 90/360]
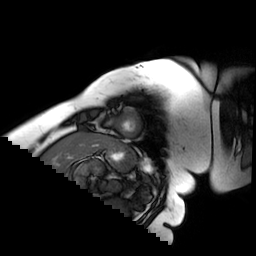
[im 180/360]
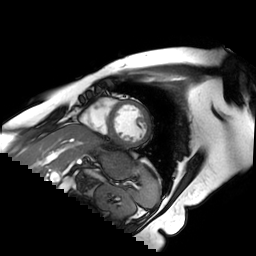
[im 270/360]
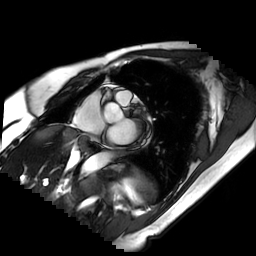
[im 360/360]
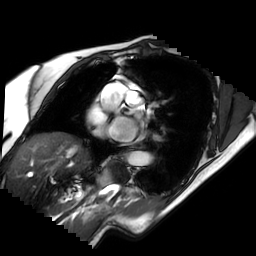

[Series 6: bSSFP · oblique · 8.0mm · 1.21mm/px · 1 of 60 slices shown (4 of 5)]
[im 1/60]
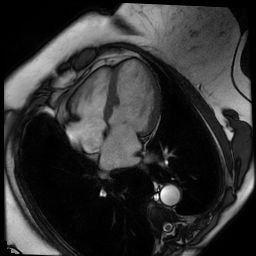

[Series 7: bSSFP · oblique · 6.0mm · 1.33mm/px · 4 of 300 slices shown (5 of 5)]
[im 1/300]
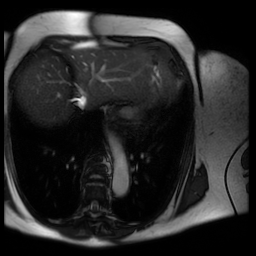
[im 100/300]
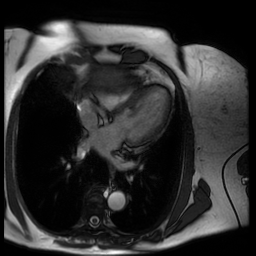
[im 200/300]
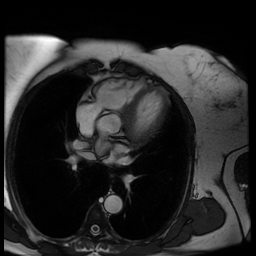
[im 300/300]
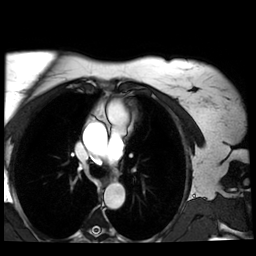

[Series 8: cine ir · oblique · 8.0mm · 1.37mm/px · 1 of 30 slices shown]
[im 1/30]
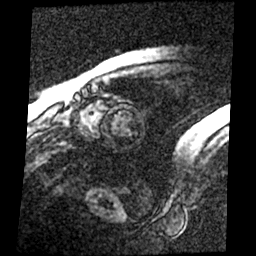

[Series 11: delayed ir prep · oblique · 8.0mm · 1.41mm/px · 1 of 19 slices shown]
[im 1/19]
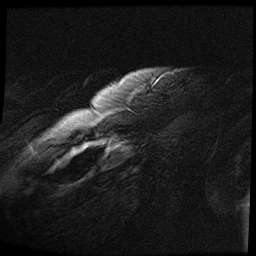

[Series 13: rad delayed ir · oblique · 8.0mm · 1.33mm/px · 1 of 3 slices shown]
[im 1/3]
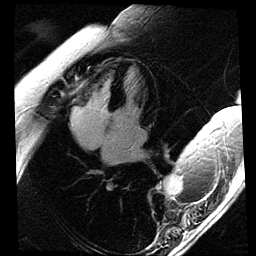

[15 of 16 positions shown; findings below may reference images not displayed]

FINDINGS: 1. Normal left ventricular size, with mild concentric hypertrophy
and moderate upper septal hypertrophy (13 mm). Normal systolic
function (LVEF = 57%). There are no regional wall motion
abnormalities.

There is no late gadolinium enhancement in the left ventricular
myocardium.

LVEDD:  49 mm

LVESD:  32 mm

LVEDV:  139 ml

LVESV:  60 ml

SV:  80 ml

CO:  5.7 L/min

Myocardial mass:  124 g

2. Normal right ventricular size, thickness and systolic function
(LVEF =56%). There are no regional wall motion abnormalities.

3.  Mild left atrial dilatation.

4. Normal size of the aortic root, ascending aorta and pulmonary
artery.

5.  Mild mitral regurgitation.

6.  Normal pericardium.  No pericardial effusion.
IMPRESSION: 1. Normal left ventricular size, with mild concentric hypertrophy
and moderate upper septal hypertrophy (13 mm). Normal systolic
function (LVEF = 57%). There are no regional wall motion
abnormalities.

There is no late gadolinium enhancement in the left ventricular
myocardium.

2. Normal right ventricular size, thickness and systolic function
(LVEF =56%). There are no regional wall motion abnormalities.

3.  Mild left atrial dilatation.

4.  Mild mitral regurgitation.

Collectively, there is no evidence for hypertrophic cardiomyopathy
or infiltrative cardiomyopathy. There is a mild LVOT gradient caused
by upper septal hypertrophy but no [place] (systolic anterior motion of
the mitral valve leaflet) is seen.

Kwokchiu Shatin

Kwokchiu Shatin

## 2017-12-14 NOTE — Progress Notes (Deleted)
Subjective:    Patient ID: Laura Poole, female    DOB: 10-29-1950, 67 y.o.   MRN: [id number]  HPI She is here for an acute visit for cold symptoms.  Her symptoms started 12/08/17.  She was seen at [facility] on 3/1.  She had a productive cough, sore throat, runny nose, vomiting, diarrhea, headaches and body aches.  She was flu negative, but there was still concern for the flu.  She was prescribed tamiflu, prednisone, hycodan cough syrup.    She is experiencing   She has taken  Medications and allergies reviewed with patient and updated if appropriate.  Patient Active Problem List   Diagnosis Date Noted  . 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 Snoring 03/30/2017  . Heart murmur 03/30/2017  . Insomnia 03/30/2017  . Routine general medical examination at a health care facility 05/17/2016  . Bilateral leg cramps 09/01/2015  . Mitral regurgitation 08/05/2015  . Leukocytosis 05/18/2015  . Patellofemoral pain syndrome 10/30/2014  . Carotid stenosis 06/23/2014  . Back pain 02/13/2014  . 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 Bruit 10/20/2011  . HYPERLIPIDEMIA 01/07/2010  . Essential hypertension 01/07/2010  . COLONIC POLYPS, HX OF 01/07/2010  . BUNIONS, BILATERAL 01/05/2010  . Nonspecific abnormal electrocardiogram (ECG) (EKG) 01/05/2010  . VITAMIN D DEFICIENCY 06/17/2009  . GERD 03/19/2008  . BREAST CANCER, HX OF 03/19/2008    Current Outpatient Medications on File Prior to Visit  Medication Sig Dispense Refill  . acetaminophen (TYLENOL) 500 MG tablet Take 500 mg by mouth every 6 (six) hours as needed for mild pain or headache.    [name] amLODipine (NORVASC) 10 MG tablet Take 1 tablet (10 mg total) by mouth daily. 90 tablet 3  . aspirin 81 MG tablet Take 1 tablet (81 mg total) by mouth daily. 30 tablet 6  . Coenzyme Q10 (CO Q 10) 100 MG CAPS Take 1 capsule by mouth every morning.    . diphenhydrAMINE (BENADRYL) 25 mg capsule Take 25 mg by mouth every 6 (six) hours as  needed for allergies.    . fluticasone (FLONASE) 50 MCG/ACT nasal spray Place 1 spray into both nostrils 2 (two) times daily as needed for allergies or rhinitis. 16 g 2  . furosemide (LASIX) 20 MG tablet Take every other day as needed for leg swelling 20 tablet 0  . gabapentin (NEURONTIN) 100 MG capsule Take 100 mg by mouth 3 (three) times daily.    [name] HYDROcodone-homatropine (HYCODAN) 5-1.5 MG/5ML syrup Take 5 mLs by mouth every 8 (eight) hours as needed for cough. 120 mL 0  . labetalol (NORMODYNE) 300 MG tablet Take 1 tablet (300 mg total) by mouth 2 (two) times daily. 180 tablet 3  . magnesium oxide (MAG-OX) 400 MG tablet 1 tablet by mouth at bedtime    . oseltamivir (TAMIFLU) 75 MG capsule Take 1 capsule (75 mg total) by mouth 2 (two) times daily for 5 days. 10 capsule 0  . pantoprazole (PROTONIX) 40 MG tablet TAKE 1 TABLET BY MOUTH 2 TIMES DAILY. 60 tablet 11  . predniSONE (DELTASONE) 10 MG tablet Take 1 tablet (10 mg total) by mouth daily with breakfast. 7 tablet 0  . spironolactone (ALDACTONE) 25 MG tablet TAKE 1 TABLET BY MOUTH ONCE DAILY IN PLACE OF GENERIC DYAZIDE AND POTASSIUM SUPPLEMENT 90 tablet 3  . tiZANidine (ZANAFLEX) 4 MG tablet Take 1 tablet (4 mg total) by mouth 3 (three) times daily. 90 tablet 0  . vitamin C (ASCORBIC ACID) 500  MG tablet Take 500 mg by mouth daily.    [name] zolpidem (AMBIEN) 10 MG tablet TAKE 1/2 TABLET (5 MG) BY MOUTH AT BEDTIME AS NEEDED FOR SLEEP 15 tablet 2   No current facility-administered medications on file prior to visit.     Past Medical History:  Diagnosis Date  . Chest pain   . GERD (gastroesophageal reflux disease)   . H/O: hysterectomy   . History of breast cancer 2006  . History of colonic polyps   . HLD (hyperlipidemia)   . HTN (hypertension)   . Personal history of chemotherapy 2006   Left Breast Cancer  . Personal history of radiation therapy 2006   Left Breast Cancer    Past Surgical History:  Procedure Laterality Date  .  ABDOMINAL HYSTERECTOMY    . BREAST LUMPECTOMY Left 2006  . COLONOSCOPY W/ POLYPECTOMY  2004, 2009   Dr Watt Climes  . TUBAL LIGATION      Social History   Socioeconomic History  . Marital status: Single    Spouse name: Not on file  . Number of children: Not on file  . Years of education: Not on file  . Highest education level: Not on file  Social Needs  . Financial resource strain: Not on file  . Food insecurity - worry: Not on file  . Food insecurity - inability: Not on file  . Transportation needs - medical: Not on file  . Transportation needs - non-medical: Not on file  Occupational History  . Not on file  Tobacco Use  . Smoking status: Never Smoker  . Smokeless tobacco: Never Used  Substance and Sexual Activity  . Alcohol use: Yes    Alcohol/week: 1.2 oz    Types: 2 Cans of beer per week    Comment: rarely  . Drug use: No  . Sexual activity: Not on file  Other Topics Concern  . Not on file  Social History Narrative  . Not on file    Family History  Problem Relation Age of Onset  . Heart attack Father 5  . Hypertension Mother   . Coronary artery disease Mother   . Aneurysm Brother   . Asthma Other        nephew  . Hyperlipidemia Other        nephew  . Sudden death Other        nephew  . Breast cancer Paternal Grandmother   . Colon cancer Neg Hx     Review of Systems     Objective:  There were no vitals filed for this visit. There were no vitals filed for this visit. There is no height or weight on file to calculate BMI.  Wt Readings from Last 3 Encounters:  12/09/17 180 lb (81.6 kg)  09/16/17 181 lb (82.1 kg)  07/29/17 181 lb (82.1 kg)     Physical Exam GENERAL APPEARANCE: Appears stated age, well appearing, NAD EYES: conjunctiva clear, no icterus HEENT: bilateral tympanic membranes and ear canals normal, oropharynx with mild erythema, no thyromegaly, trachea midline, no cervical or supraclavicular lymphadenopathy LUNGS: Clear to auscultation  without wheeze or crackles, unlabored breathing, good air entry bilaterally CARDIOVASCULAR: Normal S1,S2 without murmurs, no edema SKIN: warm, dry        Assessment & Plan:   See Problem List for Assessment and Plan of chronic medical problems.

## 2017-12-15 ENCOUNTER — Ambulatory Visit: Payer: Self-pay | Admitting: Internal Medicine

## 2017-12-22 MED FILL — ZOLPIDEM TARTRATE 10 MG TAB: 10 | 30 days supply | Qty: 15 | Fill #2

## 2017-12-23 MED FILL — PREDNISOLONE AC 1% EYE DROP: 1 | 25 days supply | Qty: 5 | Fill #0

## 2017-12-29 MED FILL — RESTASIS 0.05% EYE EMULSION: 0.05 | 90 days supply | Qty: 180 | Fill #0 | Status: TO

## 2018-01-04 ENCOUNTER — Encounter: Payer: Self-pay | Admitting: Family Medicine

## 2018-01-04 ENCOUNTER — Ambulatory Visit: Payer: BLUE CROSS/BLUE SHIELD | Admitting: Family Medicine

## 2018-01-04 VITALS — BP 140/90 | HR 101 | Temp 98.9°F | Wt 179.1 lb

## 2018-01-04 DIAGNOSIS — R0982 Postnasal drip: Secondary | ICD-10-CM

## 2018-01-04 MED ORDER — AZELASTINE HCL 0.1 % NA SOLN: 2.0000 | Freq: Two times a day (BID) | NASAL | 1 refills | Status: DC

## 2018-01-04 MED FILL — AZELASTINE HCL 137 MCG/SPRA: 137 | 25 days supply | Qty: 30 | Fill #0 | Status: TO

## 2018-01-04 NOTE — Patient Instructions (Signed)
Follow up for any fever or worsening symptoms.

## 2018-01-04 NOTE — Progress Notes (Signed)
Subjective:     Patient ID: Laura Poole, female   DOB: 12/10/1950, 67 y.o.   MRN: [id number]  HPI Patient seen with nasal congestion and some cough for the past few days. She was seen here around first of March and was treated with Tamiflu -though her flu screen of that point was negative. She was also given some low-dose prednisone. She states that about 4-5 days after starting Tamiflu she felt much better. She was doing well until about this past Saturday when she had recurrent symptoms. She's had some sneezing. No body aches. No fever. Some postnasal drip. She's had some nausea which she thinks may be secondary to postnasal drip.  Takes generic for Zyrtec without much relief. Has nasal steroid but uses inconsistently.  Past Medical History:  Diagnosis Date  . Chest pain   . GERD (gastroesophageal reflux disease)   . H/O: hysterectomy   . History of breast cancer 2006  . History of colonic polyps   . HLD (hyperlipidemia)   . HTN (hypertension)   . Personal history of chemotherapy 2006   Left Breast Cancer  . Personal history of radiation therapy 2006   Left Breast Cancer   Past Surgical History:  Procedure Laterality Date  . ABDOMINAL HYSTERECTOMY    . BREAST LUMPECTOMY Left 2006  . COLONOSCOPY W/ POLYPECTOMY  2004, 2009   Dr Watt Climes  . TUBAL LIGATION      reports that she has never smoked. She has never used smokeless tobacco. She reports that she drinks about 1.2 oz of alcohol per week. She reports that she does not use drugs. family history includes Aneurysm in her brother; Asthma in her other; Breast cancer in her paternal grandmother; Coronary artery disease in her mother; Heart attack (age of onset: 56) in her father; Hyperlipidemia in her other; Hypertension in her mother; Sudden death in her other. No Known Allergies   Review of Systems  Constitutional: Negative for chills and fever.  HENT: Positive for congestion, postnasal drip and rhinorrhea. Negative for sinus  pain.   Respiratory: Positive for cough. Negative for shortness of breath and wheezing.   Gastrointestinal: Positive for nausea.       Objective:   Physical Exam  Constitutional: She appears well-developed and well-nourished.  HENT:  Right Ear: External ear normal.  Left Ear: External ear normal.  Mouth/Throat: Oropharynx is clear and moist.  Neck: Neck supple.  Cardiovascular: Normal rate and regular rhythm.  Pulmonary/Chest: Effort normal and breath sounds normal. No respiratory distress. She has no wheezes. She has no rales.  Lymphadenopathy:    She has no cervical adenopathy.       Assessment:     Rhinorrhea and cough. Differential is viral URI versus allergic. Doubt bacterial.    Plan:     -Recommend trial of Astelin nasal 1-2 sprays per nostril twice daily as needed and continue with Zyrtec -Follow-up promptly for any fever or worsening symptoms  Eulas Post MD [facility]

## 2018-01-18 ENCOUNTER — Other Ambulatory Visit: Payer: Self-pay | Admitting: Cardiology

## 2018-01-18 MED FILL — ZOLPIDEM TARTRATE 10 MG TAB: 10 | 30 days supply | Qty: 15 | Fill #0

## 2018-01-18 NOTE — Telephone Encounter (Signed)
Pt pharmacy is requesting a refill on zolpidem 10 mg tablet. Will Dr. Radford Pax like to refill this medication? Please address

## 2018-01-26 ENCOUNTER — Other Ambulatory Visit: Payer: Self-pay | Admitting: Nurse Practitioner

## 2018-01-26 MED FILL — LABETALOL HCL 300 MG TABLET: 300 | 30 days supply | Qty: 60 | Fill #3 | Status: TO

## 2018-01-26 MED FILL — PANTOPRAZOLE SOD DR 40 MG T: 40 | 30 days supply | Qty: 60 | Fill #2 | Status: TO

## 2018-01-30 MED FILL — tiZANidine HCL 4 MG TABS: 4 | 30 days supply | Qty: 90 | Fill #0

## 2018-02-15 MED FILL — ZOLPIDEM TARTRATE 10 MG TAB: 10 | 30 days supply | Qty: 15 | Fill #1

## 2018-03-17 MED FILL — ZOLPIDEM TARTRATE 10 MG TAB: 10 | 30 days supply | Qty: 15 | Fill #2

## 2018-04-03 ENCOUNTER — Ambulatory Visit: Payer: Self-pay | Admitting: Cardiology

## 2018-04-11 ENCOUNTER — Telehealth: Payer: Self-pay | Admitting: Cardiology

## 2018-04-11 NOTE — Telephone Encounter (Signed)
Pt is calling requesting a refill on zolpidem and called in or faxed in to [facility]. Please address

## 2018-04-11 NOTE — Telephone Encounter (Signed)
I spoke with pt, she is requesting refill for ambien. Per order pt should be taking Ambien 5 mg as needed sleep. Pt states she has been taking every night. I advised pt to follow up with her priimary MD for a order for daily. She states she has an upcoming appt with her primary MD on 04/18/18. She states she will follow uo with them. Pt has her yearly f/u with Dr. Radford Pax on 06/08/18. She states understanding and thankful for the call

## 2018-04-21 ENCOUNTER — Encounter: Payer: Self-pay | Admitting: Nurse Practitioner

## 2018-04-21 ENCOUNTER — Ambulatory Visit (INDEPENDENT_AMBULATORY_CARE_PROVIDER_SITE_OTHER): Payer: Medicare Other | Admitting: Nurse Practitioner

## 2018-04-21 ENCOUNTER — Other Ambulatory Visit (INDEPENDENT_AMBULATORY_CARE_PROVIDER_SITE_OTHER): Payer: Medicare Other

## 2018-04-21 VITALS — BP 160/100 | HR 91 | Temp 99.0°F | Resp 16 | Ht 67.75 in | Wt 186.0 lb

## 2018-04-21 DIAGNOSIS — I10 Essential (primary) hypertension: Secondary | ICD-10-CM

## 2018-04-21 DIAGNOSIS — G4700 Insomnia, unspecified: Secondary | ICD-10-CM | POA: Diagnosis not present

## 2018-04-21 DIAGNOSIS — K219 Gastro-esophageal reflux disease without esophagitis: Secondary | ICD-10-CM

## 2018-04-21 LAB — CBC
HCT: 39.7 % (ref 36.0–46.0)
Hemoglobin: 13 g/dL (ref 12.0–15.0)
MCHC: 32.8 g/dL (ref 30.0–36.0)
MCV: 88.4 fl (ref 78.0–100.0)
PLATELETS: 328 10*3/uL (ref 150.0–400.0)
RBC: 4.49 Mil/uL (ref 3.87–5.11)
RDW: 15.8 % — ABNORMAL HIGH (ref 11.5–15.5)
WBC: 5.7 10*3/uL (ref 4.0–10.5)

## 2018-04-21 LAB — COMPREHENSIVE METABOLIC PANEL
ALBUMIN: 4.3 g/dL (ref 3.5–5.2)
ALT: 14 U/L (ref 0–35)
AST: 14 U/L (ref 0–37)
Alkaline Phosphatase: 68 U/L (ref 39–117)
BILIRUBIN TOTAL: 0.4 mg/dL (ref 0.2–1.2)
BUN: 10 mg/dL (ref 6–23)
CALCIUM: 9.4 mg/dL (ref 8.4–10.5)
CO2: 29 meq/L (ref 19–32)
Chloride: 103 mEq/L (ref 96–112)
Creatinine, Ser: 0.91 mg/dL (ref 0.40–1.20)
GFR: 79.78 mL/min (ref >60.00)
Glucose, Bld: 104 mg/dL — ABNORMAL HIGH (ref 70–99)
Potassium: 4.4 mEq/L (ref 3.5–5.1)
Sodium: 141 mEq/L (ref 135–145)
Total Protein: 7.5 g/dL (ref 6.0–8.3)

## 2018-04-21 LAB — LIPID PANEL
CHOL/HDL RATIO: 4
Cholesterol: 223 mg/dL — ABNORMAL HIGH (ref 0–200)
HDL: 60.4 mg/dL (ref >39.00)
LDL Cholesterol: 139 mg/dL — ABNORMAL HIGH (ref 0–99)
NONHDL: 162.55
TRIGLYCERIDES: 119 mg/dL (ref 0.0–149.0)
VLDL: 23.8 mg/dL (ref 0.0–40.0)

## 2018-04-21 LAB — TSH: TSH: 1.88 u[IU]/mL (ref 0.35–4.50)

## 2018-04-21 MED ORDER — PANTOPRAZOLE SODIUM 20 MG PO TBEC: 20.0000 mg | DELAYED_RELEASE_TABLET | Freq: Every day | ORAL | 1 refills | Status: DC

## 2018-04-21 MED ORDER — ZOLPIDEM TARTRATE 10 MG PO TABS: ORAL_TABLET | ORAL | 0 refills | Status: DC

## 2018-04-21 NOTE — Assessment & Plan Note (Signed)
Will trial reduced dosage of protonix RTC in 2 weeks for follow up - pantoprazole (PROTONIX) 20 MG tablet; Take 1 tablet (20 mg total) by mouth daily.  Dispense: 30 tablet; Refill: 1

## 2018-04-21 NOTE — Progress Notes (Signed)
Name: Angeli Demilio   MRN: [id number]    DOB: 06-07-1951   Date:04/21/2018       Progress Note  Subjective  Chief Complaint  Chief Complaint  Patient presents with  . Follow-up    HPI Ms Joo is here today for annual check up, however her blood pressure is elevated so this will take priority today. She is also requesting ambien refill and we will follow up on her GERD.  Hypertension -maintained on labetolol 300 BID, amlodipine 10 daily and Spironolactone 25 daily Her BP is quite elevated today, however she tells me that she has not taken any of her BP medication for the past 2 days because her friends are in town visiting and she stopped her blood pressure medications due to wanting to celebrate with her friends and drink alcohol, and did not want to combine her bP medication with alcohol. She intends to resume her medications today. She also says she has not been watching her diet. She says that aside from the last 2 days, she takes her amlodipine and spirinolactone daily as prescribed, but only takes her labetalol once at night due to it making her feel dizzy and tired when taking in the am. She says that she routinely checks her blood pressure readings at home and aside from the last 2 days, her readings are normal, however she can not recall the numbers Denies headaches, vision changes, chest pain, shortness of breath, edema. She overall feels well today  BP Readings from Last 3 Encounters:  04/21/18 (!) 160/100  01/04/18 140/90  12/09/17 (!) 158/100   Insomnia- maintained on ambien 10mg  1/2-1 pill prn qhs She tells me that she only takes the [place] a few nights a week, usually on the weekends after she works 3rd shift, and it does help her get a full nights sleep. She has not noticed any adverse medication effects including anxiety, confusion, amnesia, abnormal dreams. She has been receiving this Rx from her cardiologist, who asked her to get further fills from  PCP  GERD- Maintained on protonix 40 mg daily  She says she never experiences any symptoms of GERD including heartburn, acid regurgitation, dypshagia, cough. She reports since she has retired she has changed her dietary habits, which she feels has greatly helped her GERD. She was taking protonix 40 bid for some time, but due to not having any symptoms she reduced herself to 40 once daily some time ago and has remained free of symptoms  Patient Active Problem List   Diagnosis Date Noted  . 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 Snoring 03/30/2017  . Heart murmur 03/30/2017  . Insomnia 03/30/2017  . Routine general medical examination at a health care facility 05/17/2016  . Bilateral leg cramps 09/01/2015  . Mitral regurgitation 08/05/2015  . Leukocytosis 05/18/2015  . Patellofemoral pain syndrome 10/30/2014  . Carotid stenosis 06/23/2014  . Back pain 02/13/2014  . 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 Bruit 10/20/2011  . HYPERLIPIDEMIA 01/07/2010  . Essential hypertension 01/07/2010  . COLONIC POLYPS, HX OF 01/07/2010  . BUNIONS, BILATERAL 01/05/2010  . Nonspecific abnormal electrocardiogram (ECG) (EKG) 01/05/2010  . VITAMIN D DEFICIENCY 06/17/2009  . GERD 03/19/2008  . BREAST CANCER, HX OF 03/19/2008    Past Surgical History:  Procedure Laterality Date  . ABDOMINAL HYSTERECTOMY    . BREAST LUMPECTOMY Left 2006  . COLONOSCOPY W/ POLYPECTOMY  2004, 2009   Dr Watt Climes  . TUBAL LIGATION      Family History  Problem Relation Age of Onset  . Heart attack Father 82  . Hypertension Mother   . Coronary artery disease Mother   . Aneurysm Brother   . Asthma Other        nephew  . Hyperlipidemia Other        nephew  . Sudden death Other        nephew  . Breast cancer Paternal Grandmother   . Colon cancer Neg Hx     Social History   Socioeconomic History  . Marital status: Single    Spouse name: Not on file  . Number of children: Not on file  . Years of  education: Not on file  . Highest education level: Not on file  Occupational History  . Not on file  Social Needs  . Financial resource strain: Not on file  . Food insecurity:    Worry: Not on file    Inability: Not on file  . Transportation needs:    Medical: Not on file    Non-medical: Not on file  Tobacco Use  . Smoking status: Never Smoker  . Smokeless tobacco: Never Used  Substance and Sexual Activity  . Alcohol use: Yes    Alcohol/week: 1.2 oz    Types: 2 Cans of beer per week    Comment: rarely  . Drug use: No  . Sexual activity: Not on file  Lifestyle  . Physical activity:    Days per week: Not on file    Minutes per session: Not on file  . Stress: Not on file  Relationships  . Social connections:    Talks on phone: Not on file    Gets together: Not on file    Attends religious service: Not on file    Active member of club or organization: Not on file    Attends meetings of clubs or organizations: Not on file    Relationship status: Not on file  . Intimate partner violence:    Fear of current or ex partner: Not on file    Emotionally abused: Not on file    Physically abused: Not on file    Forced sexual activity: Not on file  Other Topics Concern  . Not on file  Social History Narrative  . Not on file     Current Outpatient Medications:  .  acetaminophen (TYLENOL) 500 MG tablet, Take 500 mg by mouth every 6 (six) hours as needed for mild pain or headache., Disp: , Rfl:  .  amLODipine (NORVASC) 10 MG tablet, Take 1 tablet (10 mg total) by mouth daily., Disp: 90 tablet, Rfl: 3 .  aspirin 81 MG tablet, Take 1 tablet (81 mg total) by mouth daily., Disp: 30 tablet, Rfl: 6 .  azelastine (ASTELIN) 0.1 % nasal spray, Place 2 sprays into both nostrils 2 (two) times daily. Use in each nostril as directed, Disp: 30 mL, Rfl: 1 .  Coenzyme Q10 (CO Q 10) 100 MG CAPS, Take 1 capsule by mouth every morning., Disp: , Rfl:  .  diphenhydrAMINE (BENADRYL) 25 mg capsule, Take  25 mg by mouth every 6 (six) hours as needed for allergies., Disp: , Rfl:  .  fluticasone (FLONASE) 50 MCG/ACT nasal spray, Place 1 spray into both nostrils 2 (two) times daily as needed for allergies or rhinitis., Disp: 16 g, Rfl: 2 .  labetalol (NORMODYNE) 300 MG tablet, Take 1 tablet (300 mg total) by mouth 2 (two) times daily., Disp: 180 tablet, Rfl: 3 .  magnesium  oxide (MAG-OX) 400 MG tablet, 1 tablet by mouth at bedtime, Disp: , Rfl:  .  spironolactone (ALDACTONE) 25 MG tablet, TAKE 1 TABLET BY MOUTH ONCE DAILY IN PLACE OF GENERIC DYAZIDE AND POTASSIUM SUPPLEMENT, Disp: 90 tablet, Rfl: 3 .  tiZANidine (ZANAFLEX) 4 MG tablet, TAKE 1 TABLET BY MOUTH 3 TIMES DAILY., Disp: 90 tablet, Rfl: 0 .  vitamin C (ASCORBIC ACID) 500 MG tablet, Take 500 mg by mouth daily., Disp: , Rfl:  .  zolpidem (AMBIEN) 10 MG tablet, TAKE 1/2 TABLET (5 MG) BY MOUTH AT BEDTIME AS NEEDED FOR SLEEP, Disp: 30 tablet, Rfl: 0 .  pantoprazole (PROTONIX) 20 MG tablet, Take 1 tablet (20 mg total) by mouth daily., Disp: 30 tablet, Rfl: 1  No Known Allergies   ROS See HPI  Objective  Vitals:   04/21/18 0831  BP: (!) 160/100  Pulse: 91  Resp: 16  Temp: 99 F (37.2 C)  TempSrc: Oral  SpO2: 97%  Weight: 186 lb (84.4 kg)  Height: 5' 7.75" (1.721 m)    Body mass index is 28.49 kg/m.  Physical Exam Vital signs reviewed. Constitutional: Patient appears well-developed and well-nourished. No distress.  HENT: Head: Normocephalic and atraumatic.  Nose: Nose normal. Mouth/Throat: Oropharynx is clear and moist. No oropharyngeal exudate.  Eyes: Conjunctivae and EOM are normal. Pupils are equal, round, and reactive to light. No scleral icterus.  Neck: Normal range of motion. Neck supple. No thyromegaly present.  Cardiovascular: Normal rate, regular rhythm and normal heart sounds.  No murmur heard. No BLE edema. Distal pulses intact. Pulmonary/Chest: Effort normal and breath sounds normal. No respiratory  distress. Abdominal: Soft. Bowel sounds are normal, no distension. There is no tenderness. no masses Musculoskeletal: Normal range of motion, No gross deformities Neurological: She is alert and oriented to person, place, and time. No cranial nerve deficit. Coordination, balance, strength, speech and gait are normal.  Skin: Skin is warm and dry. No rash noted. No erythema.  Psychiatric: Patient has a normal mood and affect. behavior is normal. Judgment and thought content normal.   PHQ2/9: Depression screen [facility] 2/9 07/29/2017 05/17/2016  Decreased Interest 0 0  Down, Depressed, Hopeless 0 0  PHQ - 2 Score 0 0    Fall Risk: Fall Risk  07/29/2017 05/17/2016  Falls in the past year? No No    Assessment & Plan RTC in 2 weeks for F/U: HTN- resume medications and recheck BP, GERD- reducing protonix dosage Recommended AWV with Sharee Pimple

## 2018-04-21 NOTE — Patient Instructions (Addendum)
I have sent a prescription for protonix 20mg  once daily to your pharmacy.  I have sent a refill of your ambien.  If you have medicare related insurance (such as traditional Medicare, [employer], [employer], or similar), Please make an appointment at the scheduling desk with Sharee Pimple, the [employer], for your Wellness visit in this office, which is a benefit with your insurance.  Please resume your blood pressure medications  Please return in about 2 weeks so I can see how you are doing on the reduced protonix dosage and recheck your blood pressure   Mediterranean Diet A Mediterranean diet refers to food and lifestyle choices that are based on the traditions of countries located on the [employer]. This way of eating has been shown to help prevent certain conditions and improve outcomes for people who have chronic diseases, like kidney disease and heart disease. What are tips for following this plan? Lifestyle  Cook and eat meals together with your family, when possible.  Drink enough fluid to keep your urine clear or pale yellow.  Be physically active every day. This includes: ? Aerobic exercise like running or swimming. ? Leisure activities like gardening, walking, or housework.  Get 7-8 hours of sleep each night.  If recommended by your health care provider, drink red wine in moderation. This means 1 glass a day for nonpregnant women and 2 glasses a day for men. A glass of wine equals 5 oz (150 mL). Reading food labels  Check the serving size of packaged foods. For foods such as rice and pasta, the serving size refers to the amount of cooked product, not dry.  Check the total fat in packaged foods. Avoid foods that have saturated fat or trans fats.  Check the ingredients list for added sugars, such as corn syrup. Shopping  At the grocery store, buy most of your food from the areas near the walls of the store. This includes: ? Fresh fruits  and vegetables (produce). ? Grains, beans, nuts, and seeds. Some of these may be available in unpackaged forms or large amounts (in bulk). ? Fresh seafood. ? Poultry and eggs. ? Low-fat dairy products.  Buy whole ingredients instead of prepackaged foods.  Buy fresh fruits and vegetables in-season from local farmers markets.  Buy frozen fruits and vegetables in resealable bags.  If you do not have access to quality fresh seafood, buy precooked frozen shrimp or canned fish, such as tuna, salmon, or sardines.  Buy small amounts of raw or cooked vegetables, salads, or olives from the deli or salad bar at your store.  Stock your pantry so you always have certain foods on hand, such as olive oil, canned tuna, canned tomatoes, rice, pasta, and beans. Cooking  Cook foods with extra-virgin olive oil instead of using butter or other vegetable oils.  Have meat as a side dish, and have vegetables or grains as your main dish. This means having meat in small portions or adding small amounts of meat to foods like pasta or stew.  Use beans or vegetables instead of meat in common dishes like chili or lasagna.  Experiment with different cooking methods. Try roasting or broiling vegetables instead of steaming or sauteing them.  Add frozen vegetables to soups, stews, pasta, or rice.  Add nuts or seeds for added healthy fat at each meal. You can add these to yogurt, salads, or vegetable dishes.  Marinate fish or vegetables using olive oil, lemon juice, garlic, and fresh herbs. Meal  planning  Plan to eat 1 vegetarian meal one day each week. Try to work up to 2 vegetarian meals, if possible.  Eat seafood 2 or more times a week.  Have healthy snacks readily available, such as: ? Vegetable sticks with hummus. ? [place] yogurt. ? Fruit and nut trail mix.  Eat balanced meals throughout the week. This includes: ? Fruit: 2-3 servings a day ? Vegetables: 4-5 servings a day ? Low-fat dairy: 2 servings  a day ? Fish, poultry, or lean meat: 1 serving a day ? Beans and legumes: 2 or more servings a week ? Nuts and seeds: 1-2 servings a day ? Whole grains: 6-8 servings a day ? Extra-virgin olive oil: 3-4 servings a day  Limit red meat and sweets to only a few servings a month What are my food choices?  Mediterranean diet ? Recommended ? Grains: Whole-grain pasta. Brown rice. Bulgar wheat. Polenta. Couscous. Whole-wheat bread. [place]. ? Vegetables: Artichokes. Beets. Broccoli. Cabbage. Carrots. Eggplant. Green beans. Chard. Kale. Spinach. Onions. Leeks. Peas. Squash. Tomatoes. Peppers. Radishes. ? Fruits: Apples. Apricots. Avocado. Berries. Bananas. Cherries. Dates. Figs. Grapes. Lemons. Melon. Oranges. Peaches. Plums. Pomegranate. ? Meats and other protein foods: Beans. Almonds. Sunflower seeds. Pine nuts. Peanuts. [place]. Salmon. Scallops. Shrimp. [place]. Tilapia. Clams. Oysters. Eggs. ? Dairy: Low-fat milk. Cheese. Greek yogurt. ? Beverages: Water. Red wine. Herbal tea. ? Fats and oils: Extra virgin olive oil. Avocado oil. Grape seed oil. ? Sweets and desserts: [place] yogurt with honey. Baked apples. Poached pears. Trail mix. ? Seasoning and other foods: Basil. Cilantro. Coriander. Cumin. Mint. Parsley. Sage. Rosemary. Tarragon. Garlic. Oregano. Thyme. Pepper. Balsalmic vinegar. Tahini. Hummus. Tomato sauce. Olives. Mushrooms. ? Limit these ? Grains: Prepackaged pasta or rice dishes. Prepackaged cereal with added sugar. ? Vegetables: Deep fried potatoes (french fries). ? Fruits: Fruit canned in syrup. ? Meats and other protein foods: Beef. Pork. Lamb. Poultry with skin. Hot dogs. [place] Salines. ? Dairy: Ice cream. Sour cream. Whole milk. ? Beverages: Juice. Sugar-sweetened soft drinks. Beer. Liquor and spirits. ? Fats and oils: Butter. Canola oil. Vegetable oil. Beef fat (tallow). Lard. ? Sweets and desserts: Cookies. Cakes. Pies. Candy. ? Seasoning and other foods: Mayonnaise. Premade  sauces and marinades. ? The items listed may not be a complete list. Talk with your dietitian about what dietary choices are right for you. Summary  The Mediterranean diet includes both food and lifestyle choices.  Eat a variety of fresh fruits and vegetables, beans, nuts, seeds, and whole grains.  Limit the amount of red meat and sweets that you eat.  Talk with your health care provider about whether it is safe for you to drink red wine in moderation. This means 1 glass a day for nonpregnant women and 2 glasses a day for men. A glass of wine equals 5 oz (150 mL). This information is not intended to replace advice given to you by your health care provider. Make sure you discuss any questions you have with your health care provider. Document Released: 05/20/2016 Document Revised: 06/22/2016 Document Reviewed: 05/20/2016 Elsevier Interactive Patient Education  [employer].

## 2018-04-21 NOTE — Assessment & Plan Note (Addendum)
Stable Will Continue ambien at current dosage F/U for new, worsening symptoms or any noted adverse effects - zolpidem (AMBIEN) 10 MG tablet; TAKE 1/2 TABLET (5 MG) BY MOUTH AT BEDTIME AS NEEDED FOR SLEEP  Dispense: 30 tablet; Refill: 0

## 2018-04-21 NOTE — Assessment & Plan Note (Addendum)
BP elevated off medications Discussed the importance of daily medication compliance, healthy diet in the management of HTN and printed additional information on AVS She will resume medications as prescribed, with exception of labetolol she will continue 300 once nightly per her preference and RTC in 2 weeks for a blood pressure recheck - CBC; Future - Comprehensive metabolic panel; Future - TSH; Future - Lipid panel; Future-unsure if she is fasting

## 2018-04-24 ENCOUNTER — Other Ambulatory Visit: Payer: Self-pay | Admitting: Nurse Practitioner

## 2018-04-24 DIAGNOSIS — E785 Hyperlipidemia, unspecified: Secondary | ICD-10-CM

## 2018-04-24 MED ORDER — ROSUVASTATIN CALCIUM 10 MG PO TABS: 10.0000 mg | ORAL_TABLET | Freq: Every day | ORAL | 1 refills | Status: DC

## 2018-04-25 MED FILL — ROSUVASTATIN CALCIUM 10 MG: 10 | 30 days supply | Qty: 30 | Fill #0

## 2018-05-05 ENCOUNTER — Ambulatory Visit: Payer: Medicare Other | Admitting: Nurse Practitioner

## 2018-05-15 ENCOUNTER — Ambulatory Visit: Payer: Medicare Other | Admitting: Nurse Practitioner

## 2018-05-16 DIAGNOSIS — M2042 Other hammer toe(s) (acquired), left foot: Secondary | ICD-10-CM | POA: Diagnosis not present

## 2018-05-16 DIAGNOSIS — M2041 Other hammer toe(s) (acquired), right foot: Secondary | ICD-10-CM | POA: Diagnosis not present

## 2018-05-16 DIAGNOSIS — M79671 Pain in right foot: Secondary | ICD-10-CM | POA: Diagnosis not present

## 2018-05-16 DIAGNOSIS — M79672 Pain in left foot: Secondary | ICD-10-CM | POA: Diagnosis not present

## 2018-05-19 ENCOUNTER — Encounter: Payer: Self-pay | Admitting: Nurse Practitioner

## 2018-05-19 ENCOUNTER — Ambulatory Visit (INDEPENDENT_AMBULATORY_CARE_PROVIDER_SITE_OTHER): Payer: Medicare Other | Admitting: Nurse Practitioner

## 2018-05-19 VITALS — BP 124/80 | HR 72 | Temp 98.2°F | Resp 16 | Ht 67.75 in | Wt 177.8 lb

## 2018-05-19 DIAGNOSIS — E785 Hyperlipidemia, unspecified: Secondary | ICD-10-CM

## 2018-05-19 DIAGNOSIS — Z0000 Encounter for general adult medical examination without abnormal findings: Secondary | ICD-10-CM | POA: Diagnosis not present

## 2018-05-19 DIAGNOSIS — G4700 Insomnia, unspecified: Secondary | ICD-10-CM

## 2018-05-19 DIAGNOSIS — I10 Essential (primary) hypertension: Secondary | ICD-10-CM

## 2018-05-19 DIAGNOSIS — Z1211 Encounter for screening for malignant neoplasm of colon: Secondary | ICD-10-CM

## 2018-05-19 DIAGNOSIS — K219 Gastro-esophageal reflux disease without esophagitis: Secondary | ICD-10-CM | POA: Diagnosis not present

## 2018-05-19 MED ORDER — ZOLPIDEM TARTRATE 10 MG PO TABS: ORAL_TABLET | ORAL | 1 refills | Status: DC

## 2018-05-19 NOTE — Assessment & Plan Note (Signed)
Stable Continue ambien prn Refill today-Controlled substance registry reviewed with no irregularities. F/U for new, worsening symptoms or any noted adverse effects - zolpidem (AMBIEN) 10 MG tablet; TAKE 1/2 TABLET (5 MG) BY MOUTH AT BEDTIME AS NEEDED FOR SLEEP  Dispense: 30 tablet; Refill: 1

## 2018-05-19 NOTE — Assessment & Plan Note (Signed)
BP reading normal back on medications continue current medications RTC in 3 months for F/U- recheck BP

## 2018-05-19 NOTE — Assessment & Plan Note (Signed)
Continue crestor Instructions given to return for fasting CMET and lipid panel at the end of august, labs are already ordered in Epic Discussed the role of healthy diet  in the management of HLD and additional information printed in AVS

## 2018-05-19 NOTE — Patient Instructions (Signed)
Please return around the end of August to the lab for fasting lab work  Please return to see me in about 3 months. We will recheck your blood pressure and recheck on your acid reflux.  It was nice to see you. Thanks for letting me take care of you today.   Cholesterol Cholesterol is a fat. Your body needs a small amount of cholesterol. Cholesterol (plaque) may build up in your blood vessels (arteries). That makes you more likely to have a heart attack or stroke. You cannot feel your cholesterol level. Having a blood test is the only way to find out if your level is high. Keep your test results. Work with your doctor to keep your cholesterol at a good level. What do the results mean?  Total cholesterol is how much cholesterol is in your blood.  LDL is bad cholesterol. This is the type that can build up. Try to have low LDL.  HDL is good cholesterol. It cleans your blood vessels and carries LDL away. Try to have high HDL.  Triglycerides are fat that the body can store or burn for energy. What are good levels of cholesterol?  Total cholesterol below 200.  LDL below 100 is good for people who have health risks. LDL below 70 is good for people who have very high risks.  HDL above 40 is good. It is best to have HDL of 60 or higher.  Triglycerides below 150. How can I lower my cholesterol? Diet Follow your diet program as told by your doctor.  Choose fish, white meat chicken, or [place] that is roasted or baked. Try not to eat red meat, fried foods, sausage, or lunch meats.  Eat lots of fresh fruits and vegetables.  Choose whole grains, beans, pasta, potatoes, and cereals.  Choose olive oil, corn oil, or canola oil. Only use small amounts.  Try not to eat butter, mayonnaise, shortening, or palm kernel oils.  Try not to eat foods with trans fats.  Choose low-fat or nonfat dairy foods. ? Drink skim or nonfat milk. ? Eat low-fat or nonfat yogurt and cheeses. ? Try not to drink  whole milk or cream. ? Try not to eat ice cream, egg yolks, or full-fat cheeses.  Healthy desserts include angel food cake, ginger snaps, animal crackers, hard candy, popsicles, and low-fat or nonfat frozen yogurt. Try not to eat pastries, cakes, pies, and cookies.  Exercise Follow your exercise program as told by your doctor.  Be more active. Try gardening, walking, and taking the stairs.  Ask your doctor about ways that you can be more active.  Medicine  Take over-the-counter and prescription medicines only as told by your doctor. This information is not intended to replace advice given to you by your health care provider. Make sure you discuss any questions you have with your health care provider. Document Released: 12/24/2008 Document Revised: 04/28/2016 Document Reviewed: 04/08/2016 Elsevier Interactive Patient Education  [employer].

## 2018-05-19 NOTE — Assessment & Plan Note (Signed)
Stable Continue protonix at current dosage RTC in 3 months for F/U, well see how she is doing, could consider reducing to h2 blocker if she remains symptom free at next OV

## 2018-05-19 NOTE — Progress Notes (Signed)
Name: Laura Poole   MRN: [id number]    DOB: 08-23-1951   Date:05/19/2018       Progress Note  Subjective  Chief Complaint  Chief Complaint  Patient presents with  . Follow-up    blood pressure    HPI Ms Lorensen is here today for follow up of hypertension, GERD and HLD. She is also requesting an ambien refill and referral for routine screening colonoscopy.  Hypertension -maintained on labetolol 300 BID, amlodipine 10 daily and Spironolactone 25 daily. At her last OV on 7/12, her blood pressure was quite elevated but she reported medication noncompliance partially due to just not wanting to take the medications and partially due to medication side effects-she reported feeling dizzy and tired when taking second dose of labetolol in the am. We discussed medication compliance and side effects and she agreed to resume all medications daily as prescribed with exception of labetolol,  she wanted to try taking labetolol 300 only once a day at bedtime. She returns today for a blood pressure recheck, tells me she has resumed all medications as instructed even the labetolol BID and has not noted any adverse effects, has adjusted the times of day she has been takig the blood pressure medications and has not noticed any further fatigue or dizziness. Reports feeling well today, no headaches, vision changes, chest pain, shortness of breath, edema.  BP Readings from Last 3 Encounters:  05/19/18 124/80  04/21/18 (!) 160/100  01/04/18 140/90   GERD-due to no recent symptoms of GERD and change in dietary habits we decided to reduce her protonix dosage to 20 daily to see how she did. She reports today that she has reduced dosage as instructed and continues to remain free of heartburn, acid regurgitation, dypshagia, cough. She reports continued dietary modifications, but does say she is worried that she will continue to eat this well when her granddaughter goes back to college, who is currently helping her  cook healthy meals  Cholesterol- started on crestor 10 daily after abnormal lipid panel on 7/15 OV Reports she has been taking crestor daily as instructed without noted adverse medication effects including nasuea, myalgias. She is also maintained on daily 81 asa.  Lab Results  Component Value Date   CHOL 223 (H) 04/21/2018   HDL 60.40 04/21/2018   LDLCALC 139 (H) 04/21/2018   TRIG 119.0 04/21/2018   CHOLHDL 4 04/21/2018    Patient Active Problem List   Diagnosis Date Noted  . 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 Snoring 03/30/2017  . Heart murmur 03/30/2017  . Insomnia 03/30/2017  . Routine general medical examination at a health care facility 05/17/2016  . Bilateral leg cramps 09/01/2015  . Mitral regurgitation 08/05/2015  . Leukocytosis 05/18/2015  . Patellofemoral pain syndrome 10/30/2014  . Carotid stenosis 06/23/2014  . 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 Bruit 10/20/2011  . HYPERLIPIDEMIA 01/07/2010  . Essential hypertension 01/07/2010  . COLONIC POLYPS, HX OF 01/07/2010  . BUNIONS, BILATERAL 01/05/2010  . Nonspecific abnormal electrocardiogram (ECG) (EKG) 01/05/2010  . VITAMIN D DEFICIENCY 06/17/2009  . GERD 03/19/2008  . BREAST CANCER, HX OF 03/19/2008    Past Surgical History:  Procedure Laterality Date  . ABDOMINAL HYSTERECTOMY    . BREAST LUMPECTOMY Left 2006  . COLONOSCOPY W/ POLYPECTOMY  2004, 2009   Dr Watt Climes  . TUBAL LIGATION      Family History  Problem Relation Age of Onset  . Heart attack Father 63  . Hypertension Mother   .  Coronary artery disease Mother   . Aneurysm Brother   . Asthma Other        nephew  . Hyperlipidemia Other        nephew  . Sudden death Other        nephew  . Breast cancer Paternal Grandmother   . Colon cancer Neg Hx     Social History   Socioeconomic History  . Marital status: Single    Spouse name: Not on file  . Number of children: Not on file  . Years of education: Not on file  . Highest  education level: Not on file  Occupational History  . Not on file  Social Needs  . Financial resource strain: Not on file  . Food insecurity:    Worry: Not on file    Inability: Not on file  . Transportation needs:    Medical: Not on file    Non-medical: Not on file  Tobacco Use  . Smoking status: Never Smoker  . Smokeless tobacco: Never Used  Substance and Sexual Activity  . Alcohol use: Yes    Alcohol/week: 2.0 standard drinks    Types: 2 Cans of beer per week    Comment: rarely  . Drug use: No  . Sexual activity: Not on file  Lifestyle  . Physical activity:    Days per week: Not on file    Minutes per session: Not on file  . Stress: Not on file  Relationships  . Social connections:    Talks on phone: Not on file    Gets together: Not on file    Attends religious service: Not on file    Active member of club or organization: Not on file    Attends meetings of clubs or organizations: Not on file    Relationship status: Not on file  . Intimate partner violence:    Fear of current or ex partner: Not on file    Emotionally abused: Not on file    Physically abused: Not on file    Forced sexual activity: Not on file  Other Topics Concern  . Not on file  Social History Narrative  . Not on file     Current Outpatient Medications:  .  acetaminophen (TYLENOL) 500 MG tablet, Take 500 mg by mouth every 6 (six) hours as needed for mild pain or headache., Disp: , Rfl:  .  amLODipine (NORVASC) 10 MG tablet, Take 1 tablet (10 mg total) by mouth daily., Disp: 90 tablet, Rfl: 3 .  aspirin 81 MG tablet, Take 1 tablet (81 mg total) by mouth daily., Disp: 30 tablet, Rfl: 6 .  azelastine (ASTELIN) 0.1 % nasal spray, Place 2 sprays into both nostrils 2 (two) times daily. Use in each nostril as directed, Disp: 30 mL, Rfl: 1 .  Coenzyme Q10 (CO Q 10) 100 MG CAPS, Take 1 capsule by mouth every morning., Disp: , Rfl:  .  diphenhydrAMINE (BENADRYL) 25 mg capsule, Take 25 mg by mouth every 6  (six) hours as needed for allergies., Disp: , Rfl:  .  fluticasone (FLONASE) 50 MCG/ACT nasal spray, Place 1 spray into both nostrils 2 (two) times daily as needed for allergies or rhinitis., Disp: 16 g, Rfl: 2 .  labetalol (NORMODYNE) 300 MG tablet, Take 1 tablet (300 mg total) by mouth 2 (two) times daily., Disp: 180 tablet, Rfl: 3 .  magnesium oxide (MAG-OX) 400 MG tablet, 1 tablet by mouth at bedtime, Disp: , Rfl:  .  pantoprazole (PROTONIX) 20 MG tablet, Take 1 tablet (20 mg total) by mouth daily., Disp: 30 tablet, Rfl: 1 .  rosuvastatin (CRESTOR) 10 MG tablet, Take 1 tablet (10 mg total) by mouth daily., Disp: 30 tablet, Rfl: 1 .  spironolactone (ALDACTONE) 25 MG tablet, TAKE 1 TABLET BY MOUTH ONCE DAILY IN PLACE OF GENERIC DYAZIDE AND POTASSIUM SUPPLEMENT, Disp: 90 tablet, Rfl: 3 .  tiZANidine (ZANAFLEX) 4 MG tablet, TAKE 1 TABLET BY MOUTH 3 TIMES DAILY., Disp: 90 tablet, Rfl: 0 .  vitamin C (ASCORBIC ACID) 500 MG tablet, Take 500 mg by mouth daily., Disp: , Rfl:  .  zolpidem (AMBIEN) 10 MG tablet, TAKE 1/2 TABLET (5 MG) BY MOUTH AT BEDTIME AS NEEDED FOR SLEEP, Disp: 30 tablet, Rfl: 0  No Known Allergies   ROS See HPI  Objective  Vitals:   05/19/18 0806  BP: 124/80  Pulse: 72  Resp: 16  Temp: 98.2 F (36.8 C)  TempSrc: Oral  SpO2: 97%  Weight: 177 lb 12.8 oz (80.6 kg)  Height: 5' 7.75" (1.721 m)    Body mass index is 27.23 kg/m.  Physical Exam Vital signs reviewed. Constitutional: Patient appears well-developed and well-nourished. No distress.  HENT: Head: Normocephalic and atraumatic.  Nose: Nose normal. Mouth/Throat: Oropharynx is clear and moist. No oropharyngeal exudate.  Eyes: Conjunctivae and EOM are normal. Pupils are equal, round, and reactive to light. No scleral icterus.  Neck: Normal range of motion. Neck supple. No thyromegaly present.  Cardiovascular: Normal rate, regular rhythm and normal heart sounds.  No murmur heard. No BLE edema. Distal pulses  intact. Pulmonary/Chest: Effort normal and breath sounds normal. No respiratory distress. Neurological: She is alert and oriented to person, place, and time. No cranial nerve deficit. Coordination, balance, strength, speech and gait are normal.  Skin: Skin is warm and dry. No rash noted. No erythema.  Psychiatric: Patient has a normal mood and affect. behavior is normal. Judgment and thought content normal.   Assessment & Plan RTC in 3 months for F/U: HTN- recheck BP; GERD- consider stepping down PPI; update HM  -Reviewed Health Maintenance:  Screening for colon cancer- Ambulatory referral to Gastroenterology

## 2018-05-24 ENCOUNTER — Telehealth: Payer: Self-pay

## 2018-05-24 NOTE — Telephone Encounter (Signed)
PA initiated for ambien.   OEH:[id number]

## 2018-05-29 ENCOUNTER — Telehealth: Payer: Self-pay | Admitting: *Deleted

## 2018-05-29 NOTE — Telephone Encounter (Signed)
Copied from [place] [phone]. Topic: Quick Communication - See Telephone Encounter >> May 29, 2018  7:45 AM Hewitt Shorts wrote: Pt got her ambien refilled yesterday and paid out of pocket and the pharmacy explained that if [employer] would do a prior authorization for this she would get her money back and she only got 15 pills she takes  1/2 a pill a day   Best number [phone]  [facility]

## 2018-05-30 NOTE — Telephone Encounter (Signed)
PA initiated via CoverMyMeds Additional Information Required PA was already submitted for this patient and drug which was denied. [id number];NVVYXA:[id number]  Appeal Information: [place]. [id number] Phone:[phone] Fax:[phone] WebAddress:[email or web address];  Key: [id number]   Pt ID: [id number] Group: [id number] [phone] BIN: [id number]

## 2018-06-06 DIAGNOSIS — M7742 Metatarsalgia, left foot: Secondary | ICD-10-CM | POA: Diagnosis not present

## 2018-06-06 DIAGNOSIS — M2011 Hallux valgus (acquired), right foot: Secondary | ICD-10-CM | POA: Insufficient documentation

## 2018-06-06 DIAGNOSIS — M7741 Metatarsalgia, right foot: Secondary | ICD-10-CM | POA: Diagnosis not present

## 2018-06-06 DIAGNOSIS — M2042 Other hammer toe(s) (acquired), left foot: Secondary | ICD-10-CM | POA: Diagnosis not present

## 2018-06-06 DIAGNOSIS — M2041 Other hammer toe(s) (acquired), right foot: Secondary | ICD-10-CM | POA: Diagnosis not present

## 2018-06-06 DIAGNOSIS — M2012 Hallux valgus (acquired), left foot: Secondary | ICD-10-CM | POA: Diagnosis not present

## 2018-06-08 ENCOUNTER — Ambulatory Visit: Payer: Self-pay | Admitting: Cardiology

## 2018-06-19 ENCOUNTER — Other Ambulatory Visit: Payer: Self-pay | Admitting: Nurse Practitioner

## 2018-06-19 MED FILL — ROSUVASTATIN CALCIUM 10 MG: 10 | 30 days supply | Qty: 30 | Fill #1

## 2018-06-21 ENCOUNTER — Telehealth: Payer: Self-pay | Admitting: Nurse Practitioner

## 2018-06-21 MED FILL — tiZANidine HCL 4 MG TABS: 4 | 30 days supply | Qty: 90 | Fill #0

## 2018-06-21 NOTE — Telephone Encounter (Signed)
Pt needs a refill of her zolpidem (AMBIEN) 10 MG tablet [facility]

## 2018-06-21 NOTE — Telephone Encounter (Signed)
[facility]. She can p/u next script on 06/25/18. Left message with patient's granddaughter advising of this.

## 2018-06-27 ENCOUNTER — Ambulatory Visit (INDEPENDENT_AMBULATORY_CARE_PROVIDER_SITE_OTHER): Payer: Medicare Other

## 2018-06-27 DIAGNOSIS — Z23 Encounter for immunization: Secondary | ICD-10-CM

## 2018-07-11 ENCOUNTER — Ambulatory Visit (INDEPENDENT_AMBULATORY_CARE_PROVIDER_SITE_OTHER): Payer: Medicare Other | Admitting: Cardiology

## 2018-07-11 ENCOUNTER — Encounter: Payer: Self-pay | Admitting: Cardiology

## 2018-07-11 VITALS — BP 136/80 | HR 77 | Ht 67.5 in | Wt 176.1 lb

## 2018-07-11 DIAGNOSIS — I5022 Chronic systolic (congestive) heart failure: Secondary | ICD-10-CM

## 2018-07-11 DIAGNOSIS — I10 Essential (primary) hypertension: Secondary | ICD-10-CM

## 2018-07-11 NOTE — Progress Notes (Signed)
07/11/2018 Laura Poole   02/07/1951  [id number]  Primary Physician Lance Sell, NP Primary Cardiologist: Dr. Radford Pax   Reason for Visit/CC: yearly f/u for chronic diastolic HF  HPI: Laura Poole is a 67 y.o. AA female with history of breast cancer, carotid stenosis (doppers 04/2016 were normal), and atypical chest pain as well as exertional dyspnea who had previously been followed by Dr. Aundra Dubin but now see's Dr. Radford Pax. She is here today for her yearly f/u.   Patient has had a history of nonexertional chest pain. Last Myoview obtained in April 2011 showed no evidence for ischemia. She had a cardiac catheterization in April 2013 which showed no coronary disease. She had a carotid artery ultrasound in 2015, which showed elevated left ICA  Velocity, likely due to tortuosity. Her last echocardiogram obtained on 09/10/2015 showed EF 34-19%, grade 1 diastolic dysfunction, moderate LVH, mild MR. She has chronic dyspnea on exertion.  Outpatient arterial Doppler for leg cramps was ordered which showed normal blood flow without obvious lower extremity stenosis.  She was last seen by Dr. Radford Pax in June 2018 and was doing well from a cardiac standpoint. She denied CP. She reported needing orthopedic surgery on her toe and needed cardiac clearance. Given detection of a  Murmur, Dr. Radford Pax recommended a 2D echo prior to clearance. Echo showed normal LVF with moderate LVH and increased stiffness of heart muscle, mild MR and atrial septal aneurysm and mild chordal SAM with no obstruction. Cardiac MRI recommended to assess for HOCM. This was done 05/10/18 and showed no evidence for hypertrophic cardiomyopathy or infiltrative cardiomyopathy. There is a mild LVOT gradient caused by upper septal hypertrophy but no SAM (systolic anterior motion of the mitral valve leaflet) was seen. Medical therapy recommended. She was cleared for toe surgery and did well w/o any cardiac issues.   In addition, Pt had sleep  study last year which showed mild OSA with some oxygen desaturations. Recommendations were made to set up for CPAP titration and sleep clinic f/u, however Pt not on CPAP due to inability to afford device.   Pt back today for yearly f/u. Dong well. No cardiac symptoms. Denies CP, dyspnea, palpitations, LEE, orthopnea and PND. She was seen recently by her PCP and had lipid panel showing elevated LDL at 139 mg/dL. Crestor started. Tolerating ok. Plan is for repeat LP at PCP office.  PT also noted to have elevated BP at PCP office visit in July. She had repeat assessment in August and BP was normal. BP today is 136/80. She reports full med compliance.    Current Meds  Medication Sig  . acetaminophen (TYLENOL) 500 MG tablet Take 500 mg by mouth every 6 (six) hours as needed for mild pain or headache.  [name] amLODipine (NORVASC) 10 MG tablet Take 1 tablet (10 mg total) by mouth daily.  [name] aspirin 81 MG tablet Take 1 tablet (81 mg total) by mouth daily.  . Coenzyme Q10 (CO Q 10) 100 MG CAPS Take 1 capsule by mouth every morning.  . diphenhydrAMINE (BENADRYL) 25 mg capsule Take 25 mg by mouth every 6 (six) hours as needed for allergies.  . fluticasone (FLONASE) 50 MCG/ACT nasal spray Place 1 spray into both nostrils 2 (two) times daily as needed for allergies or rhinitis.  [name] labetalol (NORMODYNE) 300 MG tablet Take 1 tablet (300 mg total) by mouth 2 (two) times daily.  . magnesium oxide (MAG-OX) 400 MG tablet 1 tablet by mouth at bedtime  . pantoprazole (PROTONIX)  20 MG tablet Take 1 tablet (20 mg total) by mouth daily.  . rosuvastatin (CRESTOR) 10 MG tablet Take 1 tablet (10 mg total) by mouth daily.  [name] spironolactone (ALDACTONE) 25 MG tablet TAKE 1 TABLET BY MOUTH ONCE DAILY IN PLACE OF GENERIC DYAZIDE AND POTASSIUM SUPPLEMENT  . tiZANidine (ZANAFLEX) 4 MG tablet TAKE 1 TABLET BY MOUTH 3 TIMES DAILY.  . vitamin C (ASCORBIC ACID) 500 MG tablet Take 500 mg by mouth daily.  [name] zolpidem (AMBIEN) 10 MG tablet  TAKE 1/2 TABLET (5 MG) BY MOUTH AT BEDTIME AS NEEDED FOR SLEEP   No Known Allergies Past Medical History:  Diagnosis Date  . Bilateral leg cramps 09/01/2015  . Carotid stenosis 06/23/2014  . Chest pain   . Essential hypertension 01/07/2010   Qualifier: Diagnosis of  By: Linna Darner MD, Gwyndolyn Saxon    . GERD (gastroesophageal reflux disease)   . H/O: hysterectomy   . Heart murmur 03/30/2017  . History of breast cancer 2006  . History of colonic polyps   . HLD (hyperlipidemia)   . HTN (hypertension)   . Mitral regurgitation 08/05/2015  . Personal history of chemotherapy 2006   Left Breast Cancer  . Personal history of radiation therapy 2006   Left Breast Cancer  . Snoring 03/30/2017   Family History  Problem Relation Age of Onset  . Heart attack Father 18  . Hypertension Mother   . Coronary artery disease Mother   . Aneurysm Brother   . Asthma Other        nephew  . Hyperlipidemia Other        nephew  . Sudden death Other        nephew  . Breast cancer Paternal Grandmother   . Colon cancer Neg Hx    Past Surgical History:  Procedure Laterality Date  . ABDOMINAL HYSTERECTOMY    . BREAST LUMPECTOMY Left 2006  . COLONOSCOPY W/ POLYPECTOMY  2004, 2009   Dr Watt Climes  . TUBAL LIGATION     Social History   Socioeconomic History  . Marital status: Single    Spouse name: Not on file  . Number of children: Not on file  . Years of education: Not on file  . Highest education level: Not on file  Occupational History  . Not on file  Social Needs  . Financial resource strain: Not on file  . Food insecurity:    Worry: Not on file    Inability: Not on file  . Transportation needs:    Medical: Not on file    Non-medical: Not on file  Tobacco Use  . Smoking status: Never Smoker  . Smokeless tobacco: Never Used  Substance and Sexual Activity  . Alcohol use: Yes    Alcohol/week: 2.0 standard drinks    Types: 2 Cans of beer per week    Comment: rarely  . Drug use: No  . Sexual  activity: Not on file  Lifestyle  . Physical activity:    Days per week: Not on file    Minutes per session: Not on file  . Stress: Not on file  Relationships  . Social connections:    Talks on phone: Not on file    Gets together: Not on file    Attends religious service: Not on file    Active member of club or organization: Not on file    Attends meetings of clubs or organizations: Not on file    Relationship status: Not on file  . Intimate  partner violence:    Fear of current or ex partner: Not on file    Emotionally abused: Not on file    Physically abused: Not on file    Forced sexual activity: Not on file  Other Topics Concern  . Not on file  Social History Narrative  . Not on file     Review of Systems: General: negative for chills, fever, night sweats or weight changes.  Cardiovascular: negative for chest pain, dyspnea on exertion, edema, orthopnea, palpitations, paroxysmal nocturnal dyspnea or shortness of breath Dermatological: negative for rash Respiratory: negative for cough or wheezing Urologic: negative for hematuria Abdominal: negative for nausea, vomiting, diarrhea, bright red blood per rectum, melena, or hematemesis Neurologic: negative for visual changes, syncope, or dizziness All other systems reviewed and are otherwise negative except as noted above.   Physical Exam:  Blood pressure 136/80, pulse 77, height 5' 7.5" (1.715 m), weight 176 lb 1.9 oz (79.9 kg).  General appearance: alert, cooperative and no distress Neck: no carotid bruit and no JVD Lungs: clear to auscultation bilaterally Heart: regular rate and rhythm and 1/6 mrumrur at RUSB Extremities: extremities normal, atraumatic, no cyanosis or edema Pulses: 2+ and symmetric Skin: Skin color, texture, turgor normal. No rashes or lesions Neurologic: Grossly normal  EKG NSR 77 bpm. -- personally reviewed   ASSESSMENT AND PLAN:   1. Chronic Diastolic Dysfunction: Echo in 2018 with normal LVEF but  diastolic dysfunction and LVH noted. She appears euvolemic on exam. No dyspnea and no edema. BP is controlled. HR stable on BB. Continue spironolactone. Recent BMP at PCP office showed normal SCr and K.  2. History of CP: negative nuclear studies in the past and LHC in 2013 showed no disease. She denies any recent CP. Continue management/ control of cardiac risk factors.   3. LVH: secondary to HTN. Cardiac MRI in 2018 negative for HOCM and no signs of infiltrative CM. BP controlled. Continue medical therapy.   4 Vascular: there were prior concerns for ICA disease as prior study in 2015 noted increased velocities, however she had repeat dopplers in 2017 which showed smooth vessel walls, without plaque formation or flow reduction. ICA velocities were normal, bilaterally on repeat study. Continue medical management of risk factors for primary prevention.   5. OSA: mild OSA on recent sleep study in 2018. However not on CPAP due to cost.   6. HTN: controlled on current regimen. Given history, pt advised to monitor at home and limit sodium intake. PCP will also continue to monitor.   7. HLD: recent lipid panel at PCP office showed elevated LDL at 139 mg/dl. Crestor initiated and pt notes full compliance. Repeat FLP planned at PCP office.    Follow-Up w/ Dr. Radford Pax in 1 year  Leshawn Straka Ladoris Gene, M[facility] 07/11/2018 4:10 PM

## 2018-07-11 NOTE — Patient Instructions (Signed)
Your physician recommends that you continue on your current medications as directed. Please refer to the Current Medication list given to you today.  Your physician wants you to follow-up in: 1 year with Dr. Turner. You will receive a reminder letter in the mail two months in advance. If you don't receive a letter, please call our office to schedule the follow-up appointment.  

## 2018-07-12 ENCOUNTER — Other Ambulatory Visit: Payer: Self-pay | Admitting: Obstetrics and Gynecology

## 2018-07-12 DIAGNOSIS — Z1231 Encounter for screening mammogram for malignant neoplasm of breast: Secondary | ICD-10-CM

## 2018-07-17 ENCOUNTER — Other Ambulatory Visit: Payer: Self-pay | Admitting: Nurse Practitioner

## 2018-07-17 DIAGNOSIS — K219 Gastro-esophageal reflux disease without esophagitis: Secondary | ICD-10-CM

## 2018-07-25 ENCOUNTER — Telehealth: Payer: Self-pay | Admitting: Nurse Practitioner

## 2018-07-25 DIAGNOSIS — I6523 Occlusion and stenosis of bilateral carotid arteries: Secondary | ICD-10-CM

## 2018-07-25 DIAGNOSIS — E785 Hyperlipidemia, unspecified: Secondary | ICD-10-CM

## 2018-07-25 MED ORDER — LABETALOL HCL 300 MG PO TABS: 300.0000 mg | ORAL_TABLET | Freq: Two times a day (BID) | ORAL | 1 refills | Status: DC

## 2018-07-25 MED ORDER — TIZANIDINE HCL 4 MG PO TABS: 4.0000 mg | ORAL_TABLET | Freq: Three times a day (TID) | ORAL | 0 refills | Status: DC

## 2018-07-25 MED ORDER — AMLODIPINE BESYLATE 10 MG PO TABS: 10.0000 mg | ORAL_TABLET | Freq: Every day | ORAL | 1 refills | Status: DC

## 2018-07-25 MED ORDER — SPIRONOLACTONE 25 MG PO TABS: ORAL_TABLET | ORAL | 1 refills | Status: DC

## 2018-07-25 MED ORDER — ROSUVASTATIN CALCIUM 10 MG PO TABS: 10.0000 mg | ORAL_TABLET | Freq: Every day | ORAL | 1 refills | Status: DC

## 2018-07-25 NOTE — Telephone Encounter (Signed)
Requested medication (s) are due for refill today:  Yes   Requested medication (s) are on the active medication list:  yes  Future visit scheduled:  yes  Last Refill: Amlodipine 04/11/17,Labetolol 04/11/17, Spironolactone 04/11/17, Rosuvastatin 04/24/18 and Tizandine 06/21/18  Requested Prescriptions  Pending Prescriptions Disp Refills   amLODipine (NORVASC) 10 MG tablet 90 tablet 1    Sig: Take 1 tablet (10 mg total) by mouth daily.     Cardiovascular:  Calcium Channel Blockers Passed - 07/25/2018  1:56 PM      Passed - Last BP in normal range    BP Readings from Last 1 Encounters:  07/11/18 136/80         Passed - Valid encounter within last 6 months    Recent Outpatient Visits          2 months ago Essential hypertension   [place], Delphia Grates, NP   3 months ago Essential hypertension   [facility] [place], Delphia Grates, NP   6 months ago PND (post-nasal drip)   [facility]rap[facility] at [place], MD   7 months ago Flu-like symptoms   Therapist, music at [facility], [place], NP   10 months ago [place], Delphia Grates, NP      Future Appointments            In 3 weeks Lance Sell, NP [facility], [facility]          labetalol (NORMODYNE) 300 MG tablet 180 tablet 1    Sig: Take 1 tablet (300 mg total) by mouth 2 (two) times daily.     Cardiovascular:  Beta Blockers Passed - 07/25/2018  1:56 PM      Passed - Last BP in normal range    BP Readings from Last 1 Encounters:  07/11/18 136/80         Passed - Last Heart Rate in normal range    Pulse Readings from Last 1 Encounters:  07/11/18 77         Passed - Valid encounter within last 6 months    Recent Outpatient Visits          2 months ago Essential hypertension   [place], Delphia Grates, NP   3 months ago Essential  hypertension   [place], Delphia Grates, NP   6 months ago PND (post-nasal drip)   Therapist, music at [facility], Alinda Sierras, MD   7 months ago Flu-like symptoms   Therapist, music at [facility], [place], NP   10 months ago [place], Delphia Grates, NP      Future Appointments            In 3 weeks Lance Sell, NP [facility], [facility]          tiZANidine (ZANAFLEX) 4 MG tablet 90 tablet 0    Sig: Take 1 tablet (4 mg total) by mouth 3 (three) times daily.     Not Delegated - Cardiovascular:  Alpha-2 Agonists - tizanidine Failed - 07/25/2018  1:56 PM      Failed - This refill cannot be delegated      Passed - Valid encounter within last 6 months    Recent Outpatient Visits          2 months  ago Essential hypertension   [place], Delphia Grates, NP   3 months ago Essential hypertension   [place], Delphia Grates, NP   6 months ago PND (post-nasal drip)   Therapist, music at [facility], Alinda Sierras, MD   7 months ago Flu-like symptoms   Therapist, music at [facility], [place], NP   10 months ago [place], Delphia Grates, NP      Future Appointments            In 3 weeks Lance Sell, NP [place], [facility]          rosuvastatin (CRESTOR) 10 MG tablet 30 tablet 1    Sig: Take 1 tablet (10 mg total) by mouth daily.     Cardiovascular:  Antilipid - Statins Failed - 07/25/2018  1:56 PM      Failed - Total Cholesterol in normal range and within 360 days    Cholesterol  Date Value Ref Range Status  04/21/2018 223 (H) 0 - 200 mg/dL Final    Comment:    ATP III Classification       Desirable:  < 200 mg/dL               Borderline High:  200 - 239 mg/dL          High:  > = 240 mg/dL          Failed - LDL in normal range and within 360 days    LDL Cholesterol  Date Value Ref Range Status  04/21/2018 139 (H) 0 - 99 mg/dL Final         Passed - HDL in normal range and within 360 days    HDL  Date Value Ref Range Status  04/21/2018 60.40 >39.00 mg/dL Final         Passed - Triglycerides in normal range and within 360 days    Triglycerides  Date Value Ref Range Status  04/21/2018 119.0 0.0 - 149.0 mg/dL Final    Comment:    Normal:  <150 mg/dLBorderline High:  150 - 199 mg/dL         Passed - Patient is not pregnant      Passed - Valid encounter within last 12 months    Recent Outpatient Visits          2 months ago Essential hypertension   [place], Delphia Grates, NP   3 months ago Essential hypertension   [place], Delphia Grates, NP   6 months ago PND (post-nasal drip)   Therapist, music at [facility], Alinda Sierras, MD   7 months ago Flu-like symptoms   Therapist, music at [facility], [place], NP   10 months ago [place], Delphia Grates, NP      Future Appointments            In 3 weeks Lance Sell, NP [facility]          spironolactone (ALDACTONE) 25 MG tablet 90 tablet 1    Sig: TAKE 1 TABLET BY MOUTH ONCE DAILY IN PLACE OF GENERIC DYAZIDE AND POTASSIUM SUPPLEMENT     Cardiovascular: Diuretics - Aldosterone Antagonist Passed - 07/25/2018  1:56 PM      Passed - Cr in normal range and within 360  days    Creat  Date Value Ref Range Status  08/04/2015 0.98 0.50 - 0.99 mg/dL Final   Creatinine, Ser  Date Value Ref Range Status  04/21/2018 0.91 0.40 - 1.20 mg/dL Final         Passed - K in normal range and within 360 days    Potassium  Date Value Ref Range Status  04/21/2018 4.4 3.5 - 5.1 mEq/L Final         Passed - Na in normal range and within 360 days    Sodium   Date Value Ref Range Status  04/21/2018 141 135 - 145 mEq/L Final         Passed - Last BP in normal range    BP Readings from Last 1 Encounters:  07/11/18 136/80         Passed - Valid encounter within last 6 months    Recent Outpatient Visits          2 months ago Essential hypertension   [place], Delphia Grates, NP   3 months ago Essential hypertension   [place], Delphia Grates, NP   6 months ago PND (post-nasal drip)   Therapist, music at [place], MD   7 months ago Flu-like symptoms   Therapist, music at [facility] [employer], [place], NP   10 months ago [place], Delphia Grates, NP      Future Appointments            In 3 weeks Gayla [facility], Delphia Grates, NP [place], [place]

## 2018-07-25 NOTE — Telephone Encounter (Signed)
Copied from [place] [id number]. Topic: Quick Communication - Rx Refill/Question >> Jul 25, 2018  1:45 PM Mcneil, Jacinto Reap wrote: Pt states she spoke with Caesar Chestnut about [facility] but none of the prescriptions were transferred. Pt states there are no refills so [facility] can not fill [facility]  Medication: amLODipine (NORVASC) 10 MG tablet, tiZANidine (ZANAFLEX) 4 MG tablet, spironolactone (ALDACTONE) 25 MG tablet, labetalol (NORMODYNE) 300 MG tablet,and rosuvastatin (CRESTOR) 10 MG tablet  Has the patient contacted their pharmacy? yes   Preferred Pharmacy (with phone number or street name): [id number] [place], [place] [phone] (Phone) [phone] (Fax)  Agent: Please be advised that RX refills may take up to 3 business days. We ask that you follow-up with your pharmacy.

## 2018-07-27 MED ORDER — TRAZODONE HCL 50 MG PO TABS: 25.0000 mg | ORAL_TABLET | Freq: Every evening | ORAL | 0 refills | Status: DC | PRN

## 2018-07-27 NOTE — Telephone Encounter (Signed)
Pt called and stated that insurance company is need PA for zolpidem PT states we would need to call [phone]. Please advise

## 2018-07-27 NOTE — Telephone Encounter (Signed)
Trazodone Rx sent for trial

## 2018-07-27 NOTE — Telephone Encounter (Signed)
Patient's plan does not cover Zolpidem. They prefer Trazodone. Pt informed of this. She states she is willing to try Trazodone. Please advise.

## 2018-07-27 NOTE — Addendum Note (Signed)
Addended by: Lance Sell on: 07/27/2018 04:58 PM   Modules accepted: Orders

## 2018-07-31 ENCOUNTER — Other Ambulatory Visit: Payer: Self-pay | Admitting: *Deleted

## 2018-07-31 DIAGNOSIS — I6523 Occlusion and stenosis of bilateral carotid arteries: Secondary | ICD-10-CM

## 2018-07-31 DIAGNOSIS — K219 Gastro-esophageal reflux disease without esophagitis: Secondary | ICD-10-CM

## 2018-07-31 DIAGNOSIS — G4700 Insomnia, unspecified: Secondary | ICD-10-CM

## 2018-07-31 MED ORDER — AMLODIPINE BESYLATE 10 MG PO TABS: 10.0000 mg | ORAL_TABLET | Freq: Every day | ORAL | 1 refills | Status: DC

## 2018-07-31 MED ORDER — PANTOPRAZOLE SODIUM 20 MG PO TBEC: 20.0000 mg | DELAYED_RELEASE_TABLET | Freq: Every day | ORAL | 1 refills | Status: DC

## 2018-07-31 MED ORDER — TIZANIDINE HCL 4 MG PO TABS: 4.0000 mg | ORAL_TABLET | Freq: Three times a day (TID) | ORAL | 0 refills | Status: DC

## 2018-07-31 NOTE — Telephone Encounter (Signed)
Need to verify what she is taking for sleep, is she taking both the trazodone and ambien? I can not send a 90 day supply of ambien, it is  A controlled substance I can send 90 day supply of trazodone Please let me know what refills are needed

## 2018-07-31 NOTE — Telephone Encounter (Signed)
Requests for 90 day supply.  

## 2018-08-01 MED ORDER — SPIRONOLACTONE 25 MG PO TABS: ORAL_TABLET | ORAL | 1 refills | Status: DC

## 2018-08-01 MED ORDER — LABETALOL HCL 300 MG PO TABS: 300.0000 mg | ORAL_TABLET | Freq: Two times a day (BID) | ORAL | 1 refills | Status: DC

## 2018-08-01 NOTE — Telephone Encounter (Signed)
I called pt- she states she was able to fill her Ambien at the local pharmacy after all. She just p/u a 30 day supply. She states she tried the Trazodone and it did not work and caused a HA.   All she is requesting today is labetalol be sent to [facility]. Refill sent. See meds.

## 2018-08-01 NOTE — Addendum Note (Signed)
Addended by: Cresenciano Lick on: 08/01/2018 11:35 AM   Modules accepted: Orders

## 2018-08-07 ENCOUNTER — Other Ambulatory Visit: Payer: Self-pay | Admitting: *Deleted

## 2018-08-07 DIAGNOSIS — E785 Hyperlipidemia, unspecified: Secondary | ICD-10-CM

## 2018-08-07 DIAGNOSIS — G4700 Insomnia, unspecified: Secondary | ICD-10-CM

## 2018-08-07 MED ORDER — TRAZODONE HCL 50 MG PO TABS: 25.0000 mg | ORAL_TABLET | Freq: Every evening | ORAL | 0 refills | Status: DC | PRN

## 2018-08-07 MED ORDER — ZOLPIDEM TARTRATE 10 MG PO TABS: 5.0000 mg | ORAL_TABLET | Freq: Every evening | ORAL | 0 refills | Status: DC | PRN

## 2018-08-07 MED ORDER — ROSUVASTATIN CALCIUM 10 MG PO TABS: 10.0000 mg | ORAL_TABLET | Freq: Every day | ORAL | 0 refills | Status: DC

## 2018-08-07 NOTE — Telephone Encounter (Signed)
Refill request for zolpidem 10 mg 90 day supply.  [place] Controlled Database Checked Last filled:07/28/18 # 15 LOV: 05/19/18 Next app: 08/21/18

## 2018-08-10 ENCOUNTER — Ambulatory Visit
Admission: RE | Admit: 2018-08-10 | Discharge: 2018-08-10 | Disposition: A | Discharge status: Home / Self Care | Payer: Medicare Other | Source: Ambulatory Visit | Attending: Obstetrics and Gynecology | Admitting: Obstetrics and Gynecology

## 2018-08-10 ENCOUNTER — Other Ambulatory Visit (INDEPENDENT_AMBULATORY_CARE_PROVIDER_SITE_OTHER): Payer: Medicare Other

## 2018-08-10 DIAGNOSIS — E785 Hyperlipidemia, unspecified: Secondary | ICD-10-CM

## 2018-08-10 DIAGNOSIS — Z1231 Encounter for screening mammogram for malignant neoplasm of breast: Secondary | ICD-10-CM | POA: Diagnosis not present

## 2018-08-10 LAB — COMPREHENSIVE METABOLIC PANEL
ALBUMIN: 4.1 g/dL (ref 3.5–5.2)
ALK PHOS: 56 U/L (ref 39–117)
ALT: 13 U/L (ref 0–35)
AST: 13 U/L (ref 0–37)
BUN: 13 mg/dL (ref 6–23)
CO2: 29 mEq/L (ref 19–32)
Calcium: 9 mg/dL (ref 8.4–10.5)
Chloride: 107 mEq/L (ref 96–112)
Creatinine, Ser: 0.92 mg/dL (ref 0.40–1.20)
GFR: 78.71 mL/min (ref >60.00)
Glucose, Bld: 98 mg/dL (ref 70–99)
POTASSIUM: 4 meq/L (ref 3.5–5.1)
SODIUM: 143 meq/L (ref 135–145)
TOTAL PROTEIN: 7 g/dL (ref 6.0–8.3)
Total Bilirubin: 0.4 mg/dL (ref 0.2–1.2)

## 2018-08-10 LAB — LIPID PANEL
CHOLESTEROL: 160 mg/dL (ref 0–200)
HDL: 56.6 mg/dL (ref >39.00)
LDL Cholesterol: 88 mg/dL (ref 0–99)
NonHDL: 103.53
TRIGLYCERIDES: 77 mg/dL (ref 0.0–149.0)
Total CHOL/HDL Ratio: 3
VLDL: 15.4 mg/dL (ref 0.0–40.0)

## 2018-08-20 ENCOUNTER — Other Ambulatory Visit: Payer: Self-pay | Admitting: Nurse Practitioner

## 2018-08-21 ENCOUNTER — Ambulatory Visit: Payer: Self-pay | Admitting: Nurse Practitioner

## 2018-08-28 ENCOUNTER — Ambulatory Visit (INDEPENDENT_AMBULATORY_CARE_PROVIDER_SITE_OTHER): Payer: Medicare Other | Admitting: Nurse Practitioner

## 2018-08-28 ENCOUNTER — Encounter: Payer: Self-pay | Admitting: Nurse Practitioner

## 2018-08-28 VITALS — BP 150/90 | HR 83 | Temp 98.2°F | Ht 67.5 in | Wt 179.0 lb

## 2018-08-28 DIAGNOSIS — J069 Acute upper respiratory infection, unspecified: Secondary | ICD-10-CM

## 2018-08-28 DIAGNOSIS — K219 Gastro-esophageal reflux disease without esophagitis: Secondary | ICD-10-CM

## 2018-08-28 DIAGNOSIS — I10 Essential (primary) hypertension: Secondary | ICD-10-CM

## 2018-08-28 MED ORDER — FAMOTIDINE 10 MG PO TABS: 10.0000 mg | ORAL_TABLET | Freq: Two times a day (BID) | ORAL | 1 refills | Status: DC

## 2018-08-28 MED ORDER — BENZONATATE 100 MG PO CAPS: 100.0000 mg | ORAL_CAPSULE | Freq: Two times a day (BID) | ORAL | 0 refills | Status: DC | PRN

## 2018-08-28 NOTE — Assessment & Plan Note (Signed)
She has remained stable on low dose PPI, will try to step down to pepcid-dosing, side effects discussed RTC in 1 month for f/u, F/U sooner for new, recurrent symptoms - famotidine (PEPCID AC) 10 MG tablet; Take 1 tablet (10 mg total) by mouth 2 (two) times daily.  Dispense: 60 tablet; Refill: 1

## 2018-08-28 NOTE — Progress Notes (Signed)
Laura Poole is a 67 y.o. female with the following history as recorded in [employer]:  Patient Active Problem List   Diagnosis Date Noted  . 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 Snoring 03/30/2017  . Heart murmur 03/30/2017  . Insomnia 03/30/2017  . Routine general medical examination at a health care facility 05/17/2016  . Bilateral leg cramps 09/01/2015  . Mitral regurgitation 08/05/2015  . Leukocytosis 05/18/2015  . Patellofemoral pain syndrome 10/30/2014  . Carotid stenosis 06/23/2014  . 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 Bruit 10/20/2011  . Hyperlipidemia 01/07/2010  . Essential hypertension 01/07/2010  . COLONIC POLYPS, HX OF 01/07/2010  . BUNIONS, BILATERAL 01/05/2010  . Nonspecific abnormal electrocardiogram (ECG) (EKG) 01/05/2010  . VITAMIN D DEFICIENCY 06/17/2009  . GERD 03/19/2008  . BREAST CANCER, HX OF 03/19/2008    Current Outpatient Medications  Medication Sig Dispense Refill  . acetaminophen (TYLENOL) 500 MG tablet Take 500 mg by mouth every 6 (six) hours as needed for mild pain or headache.    [name] amLODipine (NORVASC) 10 MG tablet Take 1 tablet (10 mg total) by mouth daily. 90 tablet 1  . aspirin 81 MG tablet Take 1 tablet (81 mg total) by mouth daily. 30 tablet 6  . Coenzyme Q10 (CO Q 10) 100 MG CAPS Take 1 capsule by mouth every morning.    . diphenhydrAMINE (BENADRYL) 25 mg capsule Take 25 mg by mouth every 6 (six) hours as needed for allergies.    . fluticasone (FLONASE) 50 MCG/ACT nasal spray Place 1 spray into both nostrils 2 (two) times daily as needed for allergies or rhinitis. 16 g 2  . labetalol (NORMODYNE) 300 MG tablet Take 1 tablet (300 mg total) by mouth 2 (two) times daily. 180 tablet 1  . magnesium oxide (MAG-OX) 400 MG tablet 1 tablet by mouth at bedtime    . rosuvastatin (CRESTOR) 10 MG tablet Take 1 tablet (10 mg total) by mouth daily. 90 tablet 0  . spironolactone (ALDACTONE) 25 MG tablet TAKE 1 TABLET BY MOUTH ONCE DAILY IN PLACE  OF GENERIC DYAZIDE AND POTASSIUM SUPPLEMENT 90 tablet 1  . tiZANidine (ZANAFLEX) 4 MG tablet Take 1 tablet (4 mg total) by mouth 3 (three) times daily. 270 tablet 0  . vitamin C (ASCORBIC ACID) 500 MG tablet Take 500 mg by mouth daily.    [name] zolpidem (AMBIEN) 10 MG tablet Take 0.5 tablets (5 mg total) by mouth at bedtime as needed for sleep. 45 tablet 0  . benzonatate (TESSALON) 100 MG capsule Take 1 capsule (100 mg total) by mouth 2 (two) times daily as needed for cough. 20 capsule 0  . famotidine (PEPCID AC) 10 MG tablet Take 1 tablet (10 mg total) by mouth 2 (two) times daily. 60 tablet 1  . traZODone (DESYREL) 50 MG tablet Take 0.5-1 tablets (25-50 mg total) by mouth at bedtime as needed for sleep. (Patient not taking: Reported on 08/28/2018) 90 tablet 0   No current facility-administered medications for this visit.     Allergies: Patient has no known allergies.  Past Medical History:  Diagnosis Date  . Bilateral leg cramps 09/01/2015  . Carotid stenosis 06/23/2014  . Chest pain   . Essential hypertension 01/07/2010   Qualifier: Diagnosis of  By: Linna Darner MD, Gwyndolyn Saxon    . GERD (gastroesophageal reflux disease)   . H/O: hysterectomy   . Heart murmur 03/30/2017  . History of breast cancer 2006  . History of colonic polyps   . HLD (hyperlipidemia)   .  HTN (hypertension)   . Mitral regurgitation 08/05/2015  . Personal history of chemotherapy 2006   Left Breast Cancer  . Personal history of radiation therapy 2006   Left Breast Cancer  . Snoring 03/30/2017    Past Surgical History:  Procedure Laterality Date  . ABDOMINAL HYSTERECTOMY    . BREAST LUMPECTOMY Left 2006  . COLONOSCOPY W/ POLYPECTOMY  2004, 2009   Dr Watt Climes  . TUBAL LIGATION      Family History  Problem Relation Age of Onset  . Heart attack Father 43  . Hypertension Mother   . Coronary artery disease Mother   . Aneurysm Brother   . Asthma Other        nephew  . Hyperlipidemia Other        nephew  . Sudden death  Other        nephew  . Breast cancer Paternal Grandmother   . Colon cancer Neg Hx     Social History   Tobacco Use  . Smoking status: Never Smoker  . Smokeless tobacco: Never Used  Substance Use Topics  . Alcohol use: Yes    Alcohol/week: 2.0 standard drinks    Types: 2 Cans of beer per week    Comment: rarely     Subjective:  Ms Myles is here today for follow up of HTN, GERD. She is also requesting evaluation of an acute complaint of cough/cold symptoms, first began about 2 days ago- reports headaches, body aches, sore throat, nasal congestion, coughing. Denies weakness, syncope, fevers, nausea, vomiting. Did not try anything at home for symptoms, did not want to take any medication that might raise her blood pressure.  Hypertension -maintained onlabetolol 300BID, amlodipine 10 daily and Spironolactone 25 daily. BP is elevated today but has not taken her medications yet, typically  takes meds in the evening due to noting feeling drowsy/dizzy  after second labetolol dose on some days. She does not regularly check BP readings at home, although she does tell me she has 2 monitors, can check if needed. Denies vision changes, chest pain, shortness of breath, edema.  BP Readings from Last 3 Encounters:  08/28/18 (!) 150/90  07/11/18 136/80  05/19/18 124/80   GERD-Due to change in diet, absence of GERD symptoms, her protonix dosage was decreased to 20 daily around July, I saw her back in August and she remained GERD free on 20 Protonix daily. Again today, she remains GERD Free on 20 daily   ROS- See HPI  Objective:  Vitals:   08/28/18 0929 08/28/18 1005  BP: (!) 170/100 (!) 150/90  Pulse: 83   Temp: 98.2 F (36.8 C)   TempSrc: Oral   SpO2: 99%   Weight: 179 lb (81.2 kg)   Height: 5' 7.5" (1.715 m)     General: Well developed, well nourished, in no acute distress  Skin : Warm and dry.  Head: Normocephalic and atraumatic  Eyes: Sclera and conjunctiva clear; pupils round  and reactive to light; extraocular movements intact  Ears: External normal; canals clear; tympanic membranes normal  Oropharynx: Pink, supple. No suspicious lesions  Neck: Supple without thyromegaly, adenopathy  Lungs: Respirations unlabored; clear to auscultation bilaterally without wheeze, rales, rhonchi  CVS exam: normal rate, regular rhythm, normal S1, S2, no murmurs, rubs, clicks or gallops.  Extremities: No edema, cyanosis, clubbing  Vessels: Symmetric bilaterally  Neurologic: Alert and oriented; speech intact; face symmetrical; moves all extremities well; CNII-XII intact without focal deficit  Psychiatric: Normal mood and affect.  Assessment:  1. Upper respiratory tract infection, unspecified type   2. Essential hypertension   3. Gastroesophageal reflux disease, esophagitis presence not specified     Plan:   Return in about 1 month (around 09/27/2018) for F/U- HTN, GERD.  No orders of the defined types were placed in this encounter.   Requested Prescriptions   Signed Prescriptions Disp Refills  . benzonatate (TESSALON) 100 MG capsule 20 capsule 0    Sig: Take 1 capsule (100 mg total) by mouth 2 (two) times daily as needed for cough.  . famotidine (PEPCID AC) 10 MG tablet 60 tablet 1    Sig: Take 1 tablet (10 mg total) by mouth 2 (two) times daily.    Upper respiratory tract infection, unspecified type Home management, red flags and return precautions including when to seek immediate care discussed and printed on AVS Tessalon sent-dosing, side effects discussed F/U for new, worsening symptoms or if no improvement in 1 week - benzonatate (TESSALON) 100 MG capsule; Take 1 capsule (100 mg total) by mouth 2 (two) times daily as needed for cough.  Dispense: 20 capsule; Refill: 0

## 2018-08-28 NOTE — Patient Instructions (Signed)
Stop protonix Start pepcid 10 mg twice daily  Resume blood pressure medications  For cough/cold: Flonase nasal spra-2 sprays in each nostril daily then reduce to 1 spray in each nostril daily when your symptoms improve You may also take an over the counter allergy medication such as claritin or zyrtec for your symptoms. Tessalon for cough. Tylenol/ibuprofen for aches, fevers  Please follow up for fevers over 101, if your symptoms get worse, or if your symptoms dont get better with the antibiotic.   Upper Respiratory Infection, Adult Most upper respiratory infections (URIs) are caused by a virus. A URI affects the nose, throat, and upper air passages. The most common type of URI is often called "the common cold." Follow these instructions at home:  Take medicines only as told by your doctor.  Gargle warm saltwater or take cough drops to comfort your throat as told by your doctor.  Use a warm mist humidifier or inhale steam from a shower to increase air moisture. This may make it easier to breathe.  Drink enough fluid to keep your pee (urine) clear or pale yellow.  Eat soups and other clear broths.  Have a healthy diet.  Rest as needed.  Go back to work when your fever is gone or your doctor says it is okay. ? You may need to stay home longer to avoid giving your URI to others. ? You can also wear a face mask and wash your hands often to prevent spread of the virus.  Use your inhaler more if you have asthma.  Do not use any tobacco products, including cigarettes, chewing tobacco, or electronic cigarettes. If you need help quitting, ask your doctor. Contact a doctor if:  You are getting worse, not better.  Your symptoms are not helped by medicine.  You have chills.  You are getting more short of breath.  You have brown or red mucus.  You have yellow or brown discharge from your nose.  You have pain in your face, especially when you bend forward.  You have a  fever.  You have puffy (swollen) neck glands.  You have pain while swallowing.  You have white areas in the back of your throat. Get help right away if:  You have very bad or constant: ? Headache. ? Ear pain. ? Pain in your forehead, behind your eyes, and over your cheekbones (sinus pain). ? Chest pain.  You have long-lasting (chronic) lung disease and any of the following: ? Wheezing. ? Long-lasting cough. ? Coughing up blood. ? A change in your usual mucus.  You have a stiff neck.  You have changes in your: ? Vision. ? Hearing. ? Thinking. ? Mood. This information is not intended to replace advice given to you by your health care provider. Make sure you discuss any questions you have with your health care provider. Document Released: 03/15/2008 Document Revised: 05/30/2016 Document Reviewed: 01/02/2014 Elsevier Interactive Patient Education  2018 [employer].

## 2018-08-28 NOTE — Assessment & Plan Note (Signed)
We discussed adjusting medication regimen due to side effects, she declines and says that side effects are minimal, she would prefer to stay on current medications for now She will resume medications daily, instructed to keep blood pressure log RTC in 1 month for F/U, if blood pressure remains elevated or if she continues to experience medication side effects, will need to consider medication adjustment

## 2018-08-29 ENCOUNTER — Ambulatory Visit: Payer: Self-pay | Admitting: *Deleted

## 2018-08-29 NOTE — Telephone Encounter (Signed)
Summary: med problem, requesting more meds for cold   Pt states that she was seen yesterday for a cold and was prescribed benzonatate (TESSALON) 100 MG capsule yesterday and that they made her very sleepy, and she felt like she was "drugged" out. She states she could not walk to the bathroom without help after taking them yesterday. She states she does not want to take them anymore. She states today she is feeling worse than yesterday. She is experiencing body aches, and just not feeling well at all.      Patient wants to know if there is something else she can use for the cough- the Tessalon was too strong. It made her feel drunk. She is doing well with the nasal spray.  Told patient I would check with ptovider and see if there is an alternative for the cough medication. Encouraged increased fluids to help break up congestion and move it up.  Reason for Disposition . Caller has NON-URGENT medication question about med that PCP prescribed and triager unable to answer question  Answer Assessment - Initial Assessment Questions 1. SYMPTOMS: "Do you have any symptoms?"     Medication too strong- made her drugged 2. SEVERITY: If symptoms are present, ask "Are they mild, moderate or severe?"     severe  Protocols used: MEDICATION QUESTION CALL-A-AH

## 2018-08-30 NOTE — Telephone Encounter (Signed)
noted 

## 2018-08-30 NOTE — Telephone Encounter (Signed)
Pt. called back and spoke with an Agent.  Reported she has decided to continue taking the Tessalon.  Reported she took it at different time than her other medications, and is tolerating it better. The pt. did not want to discuss with a nurse.

## 2018-09-28 ENCOUNTER — Encounter: Payer: Self-pay | Admitting: Nurse Practitioner

## 2018-09-28 ENCOUNTER — Ambulatory Visit (INDEPENDENT_AMBULATORY_CARE_PROVIDER_SITE_OTHER): Payer: Medicare Other | Admitting: Nurse Practitioner

## 2018-09-28 VITALS — BP 140/90 | HR 79 | Ht 67.5 in | Wt 176.0 lb

## 2018-09-28 DIAGNOSIS — G8929 Other chronic pain: Secondary | ICD-10-CM | POA: Diagnosis not present

## 2018-09-28 DIAGNOSIS — M546 Pain in thoracic spine: Secondary | ICD-10-CM

## 2018-09-28 DIAGNOSIS — I10 Essential (primary) hypertension: Secondary | ICD-10-CM | POA: Diagnosis not present

## 2018-09-28 DIAGNOSIS — K219 Gastro-esophageal reflux disease without esophagitis: Secondary | ICD-10-CM | POA: Diagnosis not present

## 2018-09-28 MED ORDER — MELOXICAM 7.5 MG PO TABS: 7.5000 mg | ORAL_TABLET | Freq: Every day | ORAL | 0 refills | Status: DC

## 2018-09-28 MED ORDER — LABETALOL HCL 300 MG PO TABS: 300.0000 mg | ORAL_TABLET | Freq: Every day | ORAL | 1 refills | Status: DC

## 2018-09-28 MED ORDER — SPIRONOLACTONE 25 MG PO TABS: 25.0000 mg | ORAL_TABLET | Freq: Two times a day (BID) | ORAL | 1 refills | Status: DC

## 2018-09-28 NOTE — Assessment & Plan Note (Signed)
BP remains slightly elevated with reported noncompliance/intolerance to labetolol Will decrease labetolol dosage and increased spironolactone dosage, continue amlodipine at current dosage Discussed the role of healthy diet in the management of HTN, additional information on AVS RTC in 3-4 weeks for F/U: recheck BP, BMET - labetalol (NORMODYNE) 300 MG tablet; Take 1 tablet (300 mg total) by mouth daily.  Dispense: 90 tablet; Refill: 1 - spironolactone (ALDACTONE) 25 MG tablet; Take 1 tablet (25 mg total) by mouth 2 (two) times daily.  Dispense: 90 tablet; Refill: 1

## 2018-09-28 NOTE — Patient Instructions (Addendum)
Continue amlodipine Decrease labetolol to once daily Increase spironolactone to twice daily  I have sent mobic antiinflammatory once daily for your back pain Daily exercises printed Please schedule a follow up appointment for further evaluation here with Dr Tamala Julian or Dr Raeford Razor, our sports medicine providers  Please return in about 3-4 weeks so I can recheck your blood pressure and make sure you are doing okay on the medciations    Back Exercises If you have pain in your back, do these exercises 2-3 times each day or as told by your doctor. When the pain goes away, do the exercises once each day, but repeat the steps more times for each exercise (do more repetitions). If you do not have pain in your back, do these exercises once each day or as told by your doctor. Exercises Single Knee to Chest Do these steps 3-5 times in a row for each leg: 1. Lie on your back on a firm bed or the floor with your legs stretched out. 2. Bring one knee to your chest. 3. Hold your knee to your chest by grabbing your knee or thigh. 4. Pull on your knee until you feel a gentle stretch in your lower back. 5. Keep doing the stretch for 10-30 seconds. 6. Slowly let go of your leg and straighten it. Pelvic Tilt Do these steps 5-10 times in a row: 1. Lie on your back on a firm bed or the floor with your legs stretched out. 2. Bend your knees so they point up to the ceiling. Your feet should be flat on the floor. 3. Tighten your lower belly (abdomen) muscles to press your lower back against the floor. This will make your tailbone point up to the ceiling instead of pointing down to your feet or the floor. 4. Stay in this position for 5-10 seconds while you gently tighten your muscles and breathe evenly. Cat-Cow Do these steps until your lower back bends more easily: 1. Get on your hands and knees on a firm surface. Keep your hands under your shoulders, and keep your knees under your hips. You may put padding under  your knees. 2. Let your head hang down, and make your tailbone point down to the floor so your lower back is round like the back of a cat. 3. Stay in this position for 5 seconds. 4. Slowly lift your head and make your tailbone point up to the ceiling so your back hangs low (sags) like the back of a cow. 5. Stay in this position for 5 seconds.  Press-Ups Do these steps 5-10 times in a row: 1. Lie on your belly (face-down) on the floor. 2. Place your hands near your head, about shoulder-width apart. 3. While you keep your back relaxed and keep your hips on the floor, slowly straighten your arms to raise the top half of your body and lift your shoulders. Do not use your back muscles. To make yourself more comfortable, you may change where you place your hands. 4. Stay in this position for 5 seconds. 5. Slowly return to lying flat on the floor.  Bridges Do these steps 10 times in a row: 1. Lie on your back on a firm surface. 2. Bend your knees so they point up to the ceiling. Your feet should be flat on the floor. 3. Tighten your butt muscles and lift your butt off of the floor until your waist is almost as high as your knees. If you do not feel the muscles working  in your butt and the back of your thighs, slide your feet 1-2 inches farther away from your butt. 4. Stay in this position for 3-5 seconds. 5. Slowly lower your butt to the floor, and let your butt muscles relax. If this exercise is too easy, try doing it with your arms crossed over your chest. Belly Crunches Do these steps 5-10 times in a row: 1. Lie on your back on a firm bed or the floor with your legs stretched out. 2. Bend your knees so they point up to the ceiling. Your feet should be flat on the floor. 3. Cross your arms over your chest. 4. Tip your chin a little bit toward your chest but do not bend your neck. 5. Tighten your belly muscles and slowly raise your chest just enough to lift your shoulder blades a tiny bit off  of the floor. 6. Slowly lower your chest and your head to the floor. Back Lifts Do these steps 5-10 times in a row: 1. Lie on your belly (face-down) with your arms at your sides, and rest your forehead on the floor. 2. Tighten the muscles in your legs and your butt. 3. Slowly lift your chest off of the floor while you keep your hips on the floor. Keep the back of your head in line with the curve in your back. Look at the floor while you do this. 4. Stay in this position for 3-5 seconds. 5. Slowly lower your chest and your face to the floor. Contact a doctor if:  Your back pain gets a lot worse when you do an exercise.  Your back pain does not lessen 2 hours after you exercise. If you have any of these problems, stop doing the exercises. Do not do them again unless your doctor says it is okay. Get help right away if:  You have sudden, very bad back pain. If this happens, stop doing the exercises. Do not do them again unless your doctor says it is okay. This information is not intended to replace advice given to you by your health care provider. Make sure you discuss any questions you have with your health care provider. Document Released: 10/30/2010 Document Revised: 06/21/2018 Document Reviewed: 11/21/2014 Elsevier Interactive Patient Education  2019 [place] DASH stands for "Dietary Approaches to Stop Hypertension." The DASH eating plan is a healthy eating plan that has been shown to reduce high blood pressure (hypertension). It may also reduce your risk for type 2 diabetes, heart disease, and stroke. The DASH eating plan may also help with weight loss. What are tips for following this plan?  General guidelines  Avoid eating more than 2,300 mg (milligrams) of salt (sodium) a day. If you have hypertension, you may need to reduce your sodium intake to 1,500 mg a day.  Limit alcohol intake to no more than 1 drink a day for nonpregnant women and 2 drinks a day for  men. One drink equals 12 oz of beer, 5 oz of wine, or 1 oz of hard liquor.  Work with your health care provider to maintain a healthy body weight or to lose weight. Ask what an ideal weight is for you.  Get at least 30 minutes of exercise that causes your heart to beat faster (aerobic exercise) most days of the week. Activities may include walking, swimming, or biking.  Work with your health care provider or diet and nutrition specialist (dietitian) to adjust your eating plan to your individual  calorie needs. Reading food labels   Check food labels for the amount of sodium per serving. Choose foods with less than 5 percent of the Daily Value of sodium. Generally, foods with less than 300 mg of sodium per serving fit into this eating plan.  To find whole grains, look for the word "whole" as the first word in the ingredient list. Shopping  Buy products labeled as "low-sodium" or "no salt added."  Buy fresh foods. Avoid canned foods and premade or frozen meals. Cooking  Avoid adding salt when cooking. Use salt-free seasonings or herbs instead of table salt or sea salt. Check with your health care provider or pharmacist before using salt substitutes.  Do not fry foods. Cook foods using healthy methods such as baking, boiling, grilling, and broiling instead.  Cook with heart-healthy oils, such as olive, canola, soybean, or sunflower oil. Meal planning  Eat a balanced diet that includes: ? 5 or more servings of fruits and vegetables each day. At each meal, try to fill half of your plate with fruits and vegetables. ? Up to 6-8 servings of whole grains each day. ? Less than 6 oz of lean meat, poultry, or fish each day. A 3-oz serving of meat is about the same size as a deck of cards. One egg equals 1 oz. ? 2 servings of low-fat dairy each day. ? A serving of nuts, seeds, or beans 5 times each week. ? Heart-healthy fats. Healthy fats called Omega-3 fatty acids are found in foods such as  flaxseeds and coldwater fish, like sardines, salmon, and mackerel.  Limit how much you eat of the following: ? Canned or prepackaged foods. ? Food that is high in trans fat, such as fried foods. ? Food that is high in saturated fat, such as fatty meat. ? Sweets, desserts, sugary drinks, and other foods with added sugar. ? Full-fat dairy products.  Do not salt foods before eating.  Try to eat at least 2 vegetarian meals each week.  Eat more home-cooked food and less restaurant, buffet, and fast food.  When eating at a restaurant, ask that your food be prepared with less salt or no salt, if possible. What foods are recommended? The items listed may not be a complete list. Talk with your dietitian about what dietary choices are best for you. Grains Whole-grain or whole-wheat bread. Whole-grain or whole-wheat pasta. Brown rice. [place]. Bulgur. Whole-grain and low-sodium cereals. Pita bread. Low-fat, low-sodium crackers. Whole-wheat flour tortillas. Vegetables Fresh or frozen vegetables (raw, steamed, roasted, or grilled). Low-sodium or reduced-sodium tomato and vegetable juice. Low-sodium or reduced-sodium tomato sauce and tomato paste. Low-sodium or reduced-sodium canned vegetables. Fruits All fresh, dried, or frozen fruit. Canned fruit in natural juice (without added sugar). Meat and other protein foods Skinless chicken or [place]. Ground chicken or [place]. Pork with fat trimmed off. Fish and seafood. Egg whites. Dried beans, peas, or lentils. Unsalted nuts, nut butters, and seeds. Unsalted canned beans. Lean cuts of beef with fat trimmed off. Low-sodium, lean deli meat. Dairy Low-fat (1%) or fat-free (skim) milk. Fat-free, low-fat, or reduced-fat cheeses. Nonfat, low-sodium ricotta or cottage cheese. Low-fat or nonfat yogurt. Low-fat, low-sodium cheese. Fats and oils Soft margarine without trans fats. Vegetable oil. Low-fat, reduced-fat, or light mayonnaise and salad dressings  (reduced-sodium). Canola, safflower, olive, soybean, and sunflower oils. Avocado. Seasoning and other foods Herbs. Spices. Seasoning mixes without salt. Unsalted popcorn and pretzels. Fat-free sweets. What foods are not recommended? The items listed may not be a  complete list. Talk with your dietitian about what dietary choices are best for you. Grains Baked goods made with fat, such as croissants, muffins, or some breads. Dry pasta or rice meal packs. Vegetables Creamed or fried vegetables. Vegetables in a cheese sauce. Regular canned vegetables (not low-sodium or reduced-sodium). Regular canned tomato sauce and paste (not low-sodium or reduced-sodium). Regular tomato and vegetable juice (not low-sodium or reduced-sodium). Angie Fava. Olives. Fruits Canned fruit in a light or heavy syrup. Fried fruit. Fruit in cream or butter sauce. Meat and other protein foods Fatty cuts of meat. Ribs. Fried meat. Berniece Salines. Sausage. Bologna and other processed lunch meats. Salami. Fatback. Hotdogs. Bratwurst. Salted nuts and seeds. Canned beans with added salt. Canned or smoked fish. Whole eggs or egg yolks. Chicken or [place] with skin. Dairy Whole or 2% milk, cream, and half-and-half. Whole or full-fat cream cheese. Whole-fat or sweetened yogurt. Full-fat cheese. Nondairy creamers. Whipped toppings. Processed cheese and cheese spreads. Fats and oils Butter. Stick margarine. Lard. Shortening. Ghee. Bacon fat. Tropical oils, such as coconut, palm kernel, or palm oil. Seasoning and other foods Salted popcorn and pretzels. Onion salt, garlic salt, seasoned salt, table salt, and sea salt. Worcestershire sauce. Tartar sauce. Barbecue sauce. Teriyaki sauce. Soy sauce, including reduced-sodium. Steak sauce. Canned and packaged gravies. Fish sauce. Oyster sauce. Cocktail sauce. Horseradish that you find on the shelf. Ketchup. Mustard. Meat flavorings and tenderizers. Bouillon cubes. Hot sauce and Tabasco sauce. Premade or  packaged marinades. Premade or packaged taco seasonings. Relishes. Regular salad dressings. Where to find more information:  National Heart, Lung, and [place]: https://wilson-eaton.com/  American Heart Association: www.heart.org Summary  The DASH eating plan is a healthy eating plan that has been shown to reduce high blood pressure (hypertension). It may also reduce your risk for type 2 diabetes, heart disease, and stroke.  With the DASH eating plan, you should limit salt (sodium) intake to 2,300 mg a day. If you have hypertension, you may need to reduce your sodium intake to 1,500 mg a day.  When on the DASH eating plan, aim to eat more fresh fruits and vegetables, whole grains, lean proteins, low-fat dairy, and heart-healthy fats.  Work with your health care provider or diet and nutrition specialist (dietitian) to adjust your eating plan to your individual calorie needs. This information is not intended to replace advice given to you by your health care provider. Make sure you discuss any questions you have with your health care provider. Document Released: 09/16/2011 Document Revised: 09/20/2016 Document Reviewed: 09/20/2016 Elsevier Interactive Patient Education  2019 [employer].

## 2018-09-28 NOTE — Assessment & Plan Note (Signed)
Stable Continue pepcid for now, consider decreasing to PRN at next OV if she remains stable

## 2018-09-28 NOTE — Progress Notes (Signed)
Laura Poole is a 67 y.o. female with the following history as recorded in [employer]:  Patient Active Problem List   Diagnosis Date Noted  . 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 Snoring 03/30/2017  . Heart murmur 03/30/2017  . Insomnia 03/30/2017  . Routine general medical examination at a health care facility 05/17/2016  . Bilateral leg cramps 09/01/2015  . Mitral regurgitation 08/05/2015  . Leukocytosis 05/18/2015  . Patellofemoral pain syndrome 10/30/2014  . Carotid stenosis 06/23/2014  . 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 Bruit 10/20/2011  . Hyperlipidemia 01/07/2010  . Essential hypertension 01/07/2010  . COLONIC POLYPS, HX OF 01/07/2010  . BUNIONS, BILATERAL 01/05/2010  . Nonspecific abnormal electrocardiogram (ECG) (EKG) 01/05/2010  . VITAMIN D DEFICIENCY 06/17/2009  . GERD 03/19/2008  . BREAST CANCER, HX OF 03/19/2008    Current Outpatient Medications  Medication Sig Dispense Refill  . acetaminophen (TYLENOL) 500 MG tablet Take 500 mg by mouth every 6 (six) hours as needed for mild pain or headache.    [name] amLODipine (NORVASC) 10 MG tablet Take 1 tablet (10 mg total) by mouth daily. 90 tablet 1  . aspirin 81 MG tablet Take 1 tablet (81 mg total) by mouth daily. 30 tablet 6  . Coenzyme Q10 (CO Q 10) 100 MG CAPS Take 1 capsule by mouth every morning.    . diphenhydrAMINE (BENADRYL) 25 mg capsule Take 25 mg by mouth every 6 (six) hours as needed for allergies.    . famotidine (PEPCID AC) 10 MG tablet Take 1 tablet (10 mg total) by mouth 2 (two) times daily. 60 tablet 1  . fluticasone (FLONASE) 50 MCG/ACT nasal spray Place 1 spray into both nostrils 2 (two) times daily as needed for allergies or rhinitis. 16 g 2  . labetalol (NORMODYNE) 300 MG tablet Take 1 tablet (300 mg total) by mouth daily. 90 tablet 1  . magnesium oxide (MAG-OX) 400 MG tablet 1 tablet by mouth at bedtime    . rosuvastatin (CRESTOR) 10 MG tablet Take 1 tablet (10 mg total) by mouth daily. 90  tablet 0  . spironolactone (ALDACTONE) 25 MG tablet Take 1 tablet (25 mg total) by mouth 2 (two) times daily. 90 tablet 1  . tiZANidine (ZANAFLEX) 4 MG tablet Take 1 tablet (4 mg total) by mouth 3 (three) times daily. 270 tablet 0  . vitamin C (ASCORBIC ACID) 500 MG tablet Take 500 mg by mouth daily.    [name] zolpidem (AMBIEN) 10 MG tablet Take 0.5 tablets (5 mg total) by mouth at bedtime as needed for sleep. 45 tablet 0  . benzonatate (TESSALON) 100 MG capsule Take 1 capsule (100 mg total) by mouth 2 (two) times daily as needed for cough. (Patient not taking: Reported on 09/28/2018) 20 capsule 0  . meloxicam (MOBIC) 7.5 MG tablet Take 1 tablet (7.5 mg total) by mouth daily. 30 tablet 0   No current facility-administered medications for this visit.     Allergies: Patient has no known allergies.  Past Medical History:  Diagnosis Date  . Bilateral leg cramps 09/01/2015  . Carotid stenosis 06/23/2014  . Chest pain   . Essential hypertension 01/07/2010   Qualifier: Diagnosis of  By: Linna Darner MD, Gwyndolyn Saxon    . GERD (gastroesophageal reflux disease)   . H/O: hysterectomy   . Heart murmur 03/30/2017  . History of breast cancer 2006  . History of colonic polyps   . HLD (hyperlipidemia)   . HTN (hypertension)   . Mitral regurgitation 08/05/2015  .  Personal history of chemotherapy 2006   Left Breast Cancer  . Personal history of radiation therapy 2006   Left Breast Cancer  . Snoring 03/30/2017    Past Surgical History:  Procedure Laterality Date  . ABDOMINAL HYSTERECTOMY    . BREAST LUMPECTOMY Left 2006  . COLONOSCOPY W/ POLYPECTOMY  2004, 2009   Dr Watt Climes  . TUBAL LIGATION      Family History  Problem Relation Age of Onset  . Heart attack Father 25  . Hypertension Mother   . Coronary artery disease Mother   . Aneurysm Brother   . Asthma Other        nephew  . Hyperlipidemia Other        nephew  . Sudden death Other        nephew  . Breast cancer Paternal Grandmother   . Colon cancer  Neg Hx     Social History   Tobacco Use  . Smoking status: Never Smoker  . Smokeless tobacco: Never Used  Substance Use Topics  . Alcohol use: Yes    Alcohol/week: 2.0 standard drinks    Types: 2 Cans of beer per week    Comment: rarely     Subjective:  [name] is here today for follow up of HTN, GERD. She would also like to discuss back pain.  Hypertension maintained on labetolol 300BID, amlodipine 10 daily and Spironolactone 25 daily She again today tells me she usually only takes labetolol 300 once daily due to it making her feel sleepy. Reports she has not been checking bp at home, but she feels well and has no complaints  She does admit she loves salt and puts it on lots of her foods Denies headaches, vision changes, chest pain, shortness of breath, edema.  BP Readings from Last 3 Encounters:  09/28/18 140/90  08/28/18 (!) 150/90  07/11/18 136/80   GERD- at her last ov on 11/18, she had been stable on protonix 20 since stepping down from 40 in July So we decided to try stepping down to pepcid 10 BID. she tells me she has switched to pepcid daily as instructed and has felt well, not noticed any adverse medication effects and continues to remain gerd, heartburn free  Back pain- this is a chronic problem for her, reports she was followed by sport med for this years ago but has not been back recently, pain is a constant aching in upper back that has seemed to slowly worsen this year. She occasionally takes flexeril which helps the pain some but does not like how it makes her sleepy. No falls, weakness, numbness, tingling.  ROS - See HPI  Objective:  Vitals:   09/28/18 0935  BP: 140/90  Pulse: 79  SpO2: 99%  Weight: 176 lb (79.8 kg)  Height: 5' 7.5" (1.715 m)    General: Well developed, well nourished, in no acute distress  Skin : Warm and dry.  Head: Normocephalic and atraumatic  Eyes: Sclera and conjunctiva clear; pupils round and reactive to light; extraocular  movements intact  Oropharynx: Pink, supple. No suspicious lesions  Neck: Supple Lungs: Respirations unlabored; clear to auscultation bilaterally  CVS exam: normal rate, regular rhythm, normal S1, S2 Musculoskeletal: No deformities; no active joint inflammation  Extremities: No edema, cyanosis Vessels: Symmetric bilaterally  Neurologic: Alert and oriented; speech intact; face symmetrical; moves all extremities well; CNII-XII intact without focal deficit  Psychiatric: Normal mood and affect.  Assessment:  1. Essential hypertension   2. Carotid stenosis,  bilateral   3. Gastroesophageal reflux disease, esophagitis presence not specified   4. Chronic bilateral thoracic back pain     Plan:    Return in about 1 month (around 10/29/2018) for F/u- HTN- increasing spirinolactone, BMET.  No orders of the defined types were placed in this encounter.   Requested Prescriptions   Signed Prescriptions Disp Refills  . labetalol (NORMODYNE) 300 MG tablet 90 tablet 1    Sig: Take 1 tablet (300 mg total) by mouth daily.  [name] spironolactone (ALDACTONE) 25 MG tablet 90 tablet 1    Sig: Take 1 tablet (25 mg total) by mouth 2 (two) times daily.  . meloxicam (MOBIC) 7.5 MG tablet 30 tablet 0    Sig: Take 1 tablet (7.5 mg total) by mouth daily.      Return in about 1 month (around 10/29/2018) for F/u- HTN- increasing spirinolactone, BMET.  No orders of the defined types were placed in this encounter.   Requested Prescriptions   Signed Prescriptions Disp Refills  . labetalol (NORMODYNE) 300 MG tablet 90 tablet 1    Sig: Take 1 tablet (300 mg total) by mouth daily.  [name] spironolactone (ALDACTONE) 25 MG tablet 90 tablet 1    Sig: Take 1 tablet (25 mg total) by mouth 2 (two) times daily.  . meloxicam (MOBIC) 7.5 MG tablet 30 tablet 0    Sig: Take 1 tablet (7.5 mg total) by mouth daily.

## 2018-09-28 NOTE — Assessment & Plan Note (Signed)
mobic course sent-dosing and side effects discussed Home management, daily exercises, red flags and return precautions including when to seek immediate care discussed and printed on AVS She will follow up with sports med for worsening/persistent pain - meloxicam (MOBIC) 7.5 MG tablet; Take 1 tablet (7.5 mg total) by mouth daily.  Dispense: 30 tablet; Refill: 0

## 2018-10-24 ENCOUNTER — Other Ambulatory Visit: Payer: Self-pay | Admitting: Nurse Practitioner

## 2018-10-24 DIAGNOSIS — M546 Pain in thoracic spine: Principal | ICD-10-CM

## 2018-10-24 DIAGNOSIS — G8929 Other chronic pain: Secondary | ICD-10-CM

## 2018-10-30 ENCOUNTER — Ambulatory Visit (INDEPENDENT_AMBULATORY_CARE_PROVIDER_SITE_OTHER): Payer: Medicare Other | Admitting: Nurse Practitioner

## 2018-10-30 ENCOUNTER — Encounter: Payer: Self-pay | Admitting: Nurse Practitioner

## 2018-10-30 ENCOUNTER — Other Ambulatory Visit (INDEPENDENT_AMBULATORY_CARE_PROVIDER_SITE_OTHER): Payer: Medicare Other

## 2018-10-30 VITALS — BP 128/80 | HR 83 | Ht 67.5 in | Wt 180.0 lb

## 2018-10-30 DIAGNOSIS — Z1159 Encounter for screening for other viral diseases: Secondary | ICD-10-CM | POA: Diagnosis not present

## 2018-10-30 DIAGNOSIS — I10 Essential (primary) hypertension: Secondary | ICD-10-CM | POA: Diagnosis not present

## 2018-10-30 DIAGNOSIS — Z13820 Encounter for screening for osteoporosis: Secondary | ICD-10-CM | POA: Diagnosis not present

## 2018-10-30 DIAGNOSIS — Z780 Asymptomatic menopausal state: Secondary | ICD-10-CM | POA: Diagnosis not present

## 2018-10-30 DIAGNOSIS — Z0000 Encounter for general adult medical examination without abnormal findings: Secondary | ICD-10-CM | POA: Diagnosis not present

## 2018-10-30 DIAGNOSIS — Z9189 Other specified personal risk factors, not elsewhere classified: Secondary | ICD-10-CM

## 2018-10-30 DIAGNOSIS — G4700 Insomnia, unspecified: Secondary | ICD-10-CM

## 2018-10-30 LAB — BASIC METABOLIC PANEL
BUN: 15 mg/dL (ref 6–23)
CHLORIDE: 102 meq/L (ref 96–112)
CO2: 28 meq/L (ref 19–32)
CREATININE: 1 mg/dL (ref 0.40–1.20)
Calcium: 9.5 mg/dL (ref 8.4–10.5)
GFR: 67.21 mL/min (ref >60.00)
Glucose, Bld: 64 mg/dL — ABNORMAL LOW (ref 70–99)
Potassium: 3.9 mEq/L (ref 3.5–5.1)
Sodium: 139 mEq/L (ref 135–145)

## 2018-10-30 MED ORDER — ZOLPIDEM TARTRATE 10 MG PO TABS: 5.0000 mg | ORAL_TABLET | Freq: Every evening | ORAL | 0 refills | Status: DC | PRN

## 2018-10-30 NOTE — Assessment & Plan Note (Addendum)
Stable Continue ambien prn Controlled substance registry reviewed with no irregularities.  F/u for new, worsening symptoms - zolpidem (AMBIEN) 10 MG tablet; Take 0.5 tablets (5 mg total) by mouth at bedtime as needed for sleep.  Dispense: 45 tablet; Refill: 0

## 2018-10-30 NOTE — Patient Instructions (Signed)
Head downstairs for labs  Schedule bone scan with front desk on your way out today  Please try to check your blood pressure once daily or at least a few times a week, at the same time each day, and keep a log. Follow up for blood pressure readings over 140/90  I will see you back in 6 months, sooner if needed

## 2018-10-30 NOTE — Assessment & Plan Note (Signed)
Stable Continue current medications RTC in 6 months for routine follow up, or sooner for BP readings >712/45 - Basic metabolic panel; Future

## 2018-10-30 NOTE — Progress Notes (Signed)
Laura Poole is a 68 y.o. female with the following history as recorded in EpicCare:  Patient Active Problem List   Diagnosis Date Noted  . 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 Snoring 03/30/2017  . Heart murmur 03/30/2017  . Insomnia 03/30/2017  . Routine general medical examination at a health care facility 05/17/2016  . Bilateral leg cramps 09/01/2015  . Mitral regurgitation 08/05/2015  . Leukocytosis 05/18/2015  . Patellofemoral pain syndrome 10/30/2014  . Carotid stenosis 06/23/2014  . 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 Bruit 10/20/2011  . Hyperlipidemia 01/07/2010  . Essential hypertension 01/07/2010  . COLONIC POLYPS, HX OF 01/07/2010  . BUNIONS, BILATERAL 01/05/2010  . Nonspecific abnormal electrocardiogram (ECG) (EKG) 01/05/2010  . VITAMIN D DEFICIENCY 06/17/2009  . GERD 03/19/2008  . BREAST CANCER, HX OF 03/19/2008    Current Outpatient Medications  Medication Sig Dispense Refill  . acetaminophen (TYLENOL) 500 MG tablet Take 500 mg by mouth every 6 (six) hours as needed for mild pain or headache.    [name] amLODipine (NORVASC) 10 MG tablet Take 1 tablet (10 mg total) by mouth daily. 90 tablet 1  . aspirin 81 MG tablet Take 1 tablet (81 mg total) by mouth daily. 30 tablet 6  . benzonatate (TESSALON) 100 MG capsule Take 1 capsule (100 mg total) by mouth 2 (two) times daily as needed for cough. 20 capsule 0  . Coenzyme Q10 (CO Q 10) 100 MG CAPS Take 1 capsule by mouth every morning.    . diphenhydrAMINE (BENADRYL) 25 mg capsule Take 25 mg by mouth every 6 (six) hours as needed for allergies.    . famotidine (PEPCID AC) 10 MG tablet Take 1 tablet (10 mg total) by mouth 2 (two) times daily. 60 tablet 1  . fluticasone (FLONASE) 50 MCG/ACT nasal spray Place 1 spray into both nostrils 2 (two) times daily as needed for allergies or rhinitis. 16 g 2  . labetalol (NORMODYNE) 300 MG tablet Take 1 tablet (300 mg total) by  mouth daily. 90 tablet 1  . magnesium oxide (MAG-OX) 400 MG tablet 1 tablet by mouth at bedtime    . meloxicam (MOBIC) 7.5 MG tablet TAKE 1 TABLET BY MOUTH EVERY DAY 30 tablet 0  . rosuvastatin (CRESTOR) 10 MG tablet Take 1 tablet (10 mg total) by mouth daily. 90 tablet 0  . spironolactone (ALDACTONE) 25 MG tablet Take 1 tablet (25 mg total) by mouth 2 (two) times daily. 90 tablet 1  . tiZANidine (ZANAFLEX) 4 MG tablet Take 1 tablet (4 mg total) by mouth 3 (three) times daily. 270 tablet 0  . vitamin C (ASCORBIC ACID) 500 MG tablet Take 500 mg by mouth daily.    [name] zolpidem (AMBIEN) 10 MG tablet Take 0.5 tablets (5 mg total) by mouth at bedtime as needed for sleep. 45 tablet 0   No current facility-administered medications for this visit.     Allergies: Patient has no known allergies.  Past Medical History:  Diagnosis Date  . Bilateral leg cramps 09/01/2015  . Carotid stenosis 06/23/2014  . Chest pain   . Essential hypertension 01/07/2010   Qualifier: Diagnosis of  By: Linna Darner MD, Gwyndolyn Saxon    . GERD (gastroesophageal reflux disease)   . H/O: hysterectomy   . Heart murmur 03/30/2017  . History of breast cancer 2006  . History of colonic polyps   . HLD (hyperlipidemia)   . HTN (hypertension)   . Mitral regurgitation 08/05/2015  .  Personal history of chemotherapy 2006   Left Breast Cancer  . Personal history of radiation therapy 2006   Left Breast Cancer  . Snoring 03/30/2017    Past Surgical History:  Procedure Laterality Date  . ABDOMINAL HYSTERECTOMY    . BREAST LUMPECTOMY Left 2006  . COLONOSCOPY W/ POLYPECTOMY  2004, 2009   Dr Watt Climes  . TUBAL LIGATION      Family History  Problem Relation Age of Onset  . Heart attack Father 35  . Hypertension Mother   . Coronary artery disease Mother   . Aneurysm Brother   . Asthma Other        nephew  . Hyperlipidemia Other        nephew  . Sudden death Other        nephew  . Breast cancer Paternal Grandmother   . Colon cancer Neg Hx      Social History   Tobacco Use  . Smoking status: Never Smoker  . Smokeless tobacco: Never Used  Substance Use Topics  . Alcohol use: Yes    Alcohol/week: 2.0 standard drinks    Types: 2 Cans of beer per week    Comment: rarely     Subjective:   Laura Poole is here today for HTN follow up, requesting ambien refill as well.  Hypertension -maintained on labetolol 300 daily,amlodipine 10 daily and Spironolactone was increased to 25 BID at last OV 09/28/18 due to continued elevated BP readings Today, she tells me she has adjusted medications as instructed, taking daily without missed doses, tolerating well without noted adverse effects, has not been checking blood pressure readings because she has not felt it has been high, feels well without complaints. No weakness, syncope, headaches, vision changes, chest pain, shortness of breath, edema. Has increased walking, joined senior group to become more active.  BP Readings from Last 3 Encounters:  10/30/18 128/80  09/28/18 140/90  08/28/18 (!) 150/90   Insomnia-maintained on ambien 10- 1/2 to 1 tab qhs prn for some time now, tolerating well , no noted adverse effects, only takes 1-2 nights per week when coming off night shift after working several night shifts in a row, helps her get back on regular sleep cycle for days off.  ROS- See HPI  Objective:  Vitals:   10/30/18 0857  BP: 128/80  Pulse: 83  SpO2: 98%  Weight: 180 lb (81.6 kg)  Height: 5' 7.5" (1.715 m)    General: Well developed, well nourished, in no acute distress  Skin : Warm and dry.  Head: Normocephalic and atraumatic  Eyes: Sclera and conjunctiva clear; pupils round and reactive to light; extraocular movements intact  Oropharynx: Pink, supple. No suspicious lesions  Neck: Supple Lungs: Respirations unlabored; clear to auscultation bilaterally without wheeze, rales, rhonchi  CVS exam: normal rate and regular rhythm, S1 and S2 normal.  Extremities: No edema,  cyanosis, clubbing  Vessels: Symmetric bilaterally  Neurologic: Alert and oriented; speech intact; face symmetrical; moves all extremities well; CNII-XII intact without focal deficit  Psychiatric: Normal mood and affect.   Assessment:  1. Essential hypertension   2. Encounter for hepatitis C virus screening test for high risk patient   3. Screening for osteoporosis   4. Asymptomatic menopausal state   5. Insomnia, unspecified type     Plan:   Healthcare maintenance:  Screening for osteoporosis Last done 2013 We discussed updating, she is agreeable - DG Bone Density; Future Asymptomatic menopausal state - DG Bone Density; Future Encounter for  hepatitis C virus screening test for high risk patient- Hepatitis C antibody; Future   Return in about 6 months (around 04/30/2019) for routine follow up-annual labs.  Orders Placed This Encounter  Procedures  . DG Bone Density    Standing Status:   Future    Standing Expiration Date:   12/29/2019    Order Specific Question:   Reason for Exam (SYMPTOM  OR DIAGNOSIS REQUIRED)    Answer:   screening for osteoporosis    Order Specific Question:   Preferred imaging location?    Answer:   [facility]  . Basic metabolic panel    Standing Status:   Future    Standing Expiration Date:   10/31/2019  . Hepatitis C antibody    Standing Status:   Future    Standing Expiration Date:   11/30/2018    Requested Prescriptions   Signed Prescriptions Disp Refills  . zolpidem (AMBIEN) 10 MG tablet 45 tablet 0    Sig: Take 0.5 tablets (5 mg total) by mouth at bedtime as needed for sleep.

## 2018-10-31 LAB — HEPATITIS C ANTIBODY
Hepatitis C Ab: NONREACTIVE
SIGNAL TO CUT-OFF: 0.02 (ref <1.00)

## 2018-11-03 ENCOUNTER — Inpatient Hospital Stay: Admission: RE | Admit: 2018-11-03 | Payer: Medicare Other | Source: Ambulatory Visit

## 2018-11-22 ENCOUNTER — Ambulatory Visit (INDEPENDENT_AMBULATORY_CARE_PROVIDER_SITE_OTHER): Payer: Medicare Other | Admitting: Nurse Practitioner

## 2018-11-22 ENCOUNTER — Encounter: Payer: Self-pay | Admitting: Nurse Practitioner

## 2018-11-22 ENCOUNTER — Other Ambulatory Visit (INDEPENDENT_AMBULATORY_CARE_PROVIDER_SITE_OTHER): Payer: Medicare Other

## 2018-11-22 ENCOUNTER — Ambulatory Visit (INDEPENDENT_AMBULATORY_CARE_PROVIDER_SITE_OTHER)
Admission: RE | Admit: 2018-11-22 | Discharge: 2018-11-22 | Disposition: A | Discharge status: Home / Self Care | Payer: Medicare Other | Source: Ambulatory Visit | Attending: Nurse Practitioner | Admitting: Nurse Practitioner

## 2018-11-22 VITALS — BP 130/76 | HR 83 | Temp 98.3°F | Ht 67.5 in | Wt 179.0 lb

## 2018-11-22 DIAGNOSIS — R05 Cough: Secondary | ICD-10-CM

## 2018-11-22 DIAGNOSIS — R059 Cough, unspecified: Secondary | ICD-10-CM

## 2018-11-22 DIAGNOSIS — R5381 Other malaise: Secondary | ICD-10-CM

## 2018-11-22 LAB — COMPREHENSIVE METABOLIC PANEL
ALT: 14 U/L (ref 0–35)
AST: 15 U/L (ref 0–37)
Albumin: 4.2 g/dL (ref 3.5–5.2)
Alkaline Phosphatase: 60 U/L (ref 39–117)
BUN: 13 mg/dL (ref 6–23)
CO2: 29 mEq/L (ref 19–32)
Calcium: 9.3 mg/dL (ref 8.4–10.5)
Chloride: 104 mEq/L (ref 96–112)
Creatinine, Ser: 0.89 mg/dL (ref 0.40–1.20)
GFR: 76.87 mL/min (ref >60.00)
Glucose, Bld: 87 mg/dL (ref 70–99)
Potassium: 3.9 mEq/L (ref 3.5–5.1)
Sodium: 140 mEq/L (ref 135–145)
Total Bilirubin: 0.4 mg/dL (ref 0.2–1.2)
Total Protein: 7.3 g/dL (ref 6.0–8.3)

## 2018-11-22 LAB — CBC
HCT: 36.2 % (ref 36.0–46.0)
Hemoglobin: 12 g/dL (ref 12.0–15.0)
MCHC: 33.2 g/dL (ref 30.0–36.0)
MCV: 88.3 fl (ref 78.0–100.0)
Platelets: 319 10*3/uL (ref 150.0–400.0)
RBC: 4.1 Mil/uL (ref 3.87–5.11)
RDW: 15.3 % (ref 11.5–15.5)
WBC: 7.1 10*3/uL (ref 4.0–10.5)

## 2018-11-22 NOTE — Patient Instructions (Addendum)
Head downstairs for labs/chest xray  I will let you know when I get the results   Cough, Adult  A cough helps to clear your throat and lungs. A cough may last only 2-3 weeks (acute), or it may last longer than 8 weeks (chronic). Many different things can cause a cough. A cough may be a sign of an illness or another medical condition. Follow these instructions at home:  Pay attention to any changes in your cough.  Take medicines only as told by your doctor. ? If you were prescribed an antibiotic medicine, take it as told by your doctor. Do not stop taking it even if you start to feel better. ? Talk with your doctor before you try using a cough medicine.  Drink enough fluid to keep your pee (urine) clear or pale yellow.  If the air is dry, use a cold steam vaporizer or humidifier in your home.  Stay away from things that make you cough at work or at home.  If your cough is worse at night, try using extra pillows to raise your head up higher while you sleep.  Do not smoke, and try not to be around smoke. If you need help quitting, ask your doctor.  Do not have caffeine.  Do not drink alcohol.  Rest as needed. Contact a doctor if:  You have new problems (symptoms).  You cough up yellow fluid (pus).  Your cough does not get better after 2-3 weeks, or your cough gets worse.  Medicine does not help your cough and you are not sleeping well.  You have pain that gets worse or pain that is not helped with medicine.  You have a fever.  You are losing weight and you do not know why.  You have night sweats. Get help right away if:  You cough up blood.  You have trouble breathing.  Your heartbeat is very fast. This information is not intended to replace advice given to you by your health care provider. Make sure you discuss any questions you have with your health care provider. Document Released: 06/10/2011 Document Revised: 03/04/2016 Document Reviewed: 12/04/2014 Elsevier  Interactive Patient Education  2019 [employer].

## 2018-11-22 NOTE — Progress Notes (Signed)
Laura Poole is a 68 y.o. female with the following history as recorded in EpicCare:  Patient Active Problem List   Diagnosis Date Noted  . 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 Snoring 03/30/2017  . Heart murmur 03/30/2017  . Insomnia 03/30/2017  . Routine general medical examination at a health care facility 05/17/2016  . Mitral regurgitation 08/05/2015  . Leukocytosis 05/18/2015  . Patellofemoral pain syndrome 10/30/2014  . Carotid stenosis 06/23/2014  . 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 Bruit 10/20/2011  . Hyperlipidemia 01/07/2010  . Essential hypertension 01/07/2010  . COLONIC POLYPS, HX OF 01/07/2010  . BUNIONS, BILATERAL 01/05/2010  . Nonspecific abnormal electrocardiogram (ECG) (EKG) 01/05/2010  . VITAMIN D DEFICIENCY 06/17/2009  . GERD 03/19/2008  . BREAST CANCER, HX OF 03/19/2008    Current Outpatient Medications  Medication Sig Dispense Refill  . acetaminophen (TYLENOL) 500 MG tablet Take 500 mg by mouth every 6 (six) hours as needed for mild pain or headache.    [name] amLODipine (NORVASC) 10 MG tablet Take 1 tablet (10 mg total) by mouth daily. 90 tablet 1  . aspirin 81 MG tablet Take 1 tablet (81 mg total) by mouth daily. 30 tablet 6  . benzonatate (TESSALON) 100 MG capsule Take 1 capsule (100 mg total) by mouth 2 (two) times daily as needed for cough. 20 capsule 0  . Coenzyme Q10 (CO Q 10) 100 MG CAPS Take 1 capsule by mouth every morning.    . diphenhydrAMINE (BENADRYL) 25 mg capsule Take 25 mg by mouth every 6 (six) hours as needed for allergies.    . famotidine (PEPCID AC) 10 MG tablet Take 1 tablet (10 mg total) by mouth 2 (two) times daily. 60 tablet 1  . fluticasone (FLONASE) 50 MCG/ACT nasal spray Place 1 spray into both nostrils 2 (two) times daily as needed for allergies or rhinitis. 16 g 2  . labetalol (NORMODYNE) 300 MG tablet Take 1 tablet (300 mg total) by mouth daily. 90 tablet 1  .  magnesium oxide (MAG-OX) 400 MG tablet 1 tablet by mouth at bedtime    . meloxicam (MOBIC) 7.5 MG tablet TAKE 1 TABLET BY MOUTH EVERY DAY 30 tablet 0  . rosuvastatin (CRESTOR) 10 MG tablet Take 1 tablet (10 mg total) by mouth daily. 90 tablet 0  . spironolactone (ALDACTONE) 25 MG tablet Take 1 tablet (25 mg total) by mouth 2 (two) times daily. 90 tablet 1  . tiZANidine (ZANAFLEX) 4 MG tablet Take 1 tablet (4 mg total) by mouth 3 (three) times daily. 270 tablet 0  . vitamin C (ASCORBIC ACID) 500 MG tablet Take 500 mg by mouth daily.    [name] zolpidem (AMBIEN) 10 MG tablet Take 0.5 tablets (5 mg total) by mouth at bedtime as needed for sleep. 45 tablet 0   No current facility-administered medications for this visit.     Allergies: Patient has no known allergies.  Past Medical History:  Diagnosis Date  . Bilateral leg cramps 09/01/2015  . Carotid stenosis 06/23/2014  . Chest pain   . Essential hypertension 01/07/2010   Qualifier: Diagnosis of  By: Linna Darner MD, Gwyndolyn Saxon    . GERD (gastroesophageal reflux disease)   . H/O: hysterectomy   . Heart murmur 03/30/2017  . History of breast cancer 2006  . History of colonic polyps   . HLD (hyperlipidemia)   . HTN (hypertension)   . Mitral regurgitation 08/05/2015  . Personal history of chemotherapy  2006   Left Breast Cancer  . Personal history of radiation therapy 2006   Left Breast Cancer  . Snoring 03/30/2017    Past Surgical History:  Procedure Laterality Date  . ABDOMINAL HYSTERECTOMY    . BREAST LUMPECTOMY Left 2006  . COLONOSCOPY W/ POLYPECTOMY  2004, 2009   Dr Watt Climes  . TUBAL LIGATION      Family History  Problem Relation Age of Onset  . Heart attack Father 42  . Hypertension Mother   . Coronary artery disease Mother   . Aneurysm Brother   . Asthma Other        nephew  . Hyperlipidemia Other        nephew  . Sudden death Other        nephew  . Breast cancer Paternal Grandmother   . Colon cancer Neg Hx     Social History    Tobacco Use  . Smoking status: Never Smoker  . Smokeless tobacco: Never Used  Substance Use Topics  . Alcohol use: Yes    Alcohol/week: 2.0 standard drinks    Types: 2 Cans of beer per week    Comment: rarely     Subjective:  Laura Poole is here today requesting evaluation of acute complaint of cough/malaise/weakness, which first began about 2 weeks ago after exposed to bug spray at work-- she works at [employer] and several residents had scabies, exterminator came in to spray for scabies and she says she could "smell the spray" and "has felt bad, not like myself since." she has also developed cough, sometimes productive, since exposure to the bug spray. she did ask the exterminator if it was safe for her to be around the chemical and she was told it was safe. She says that aside from malaise, cough, she has no other symptoms and otherwise feels well  Review of Systems  Constitutional: Positive for malaise/fatigue. Negative for chills and fever.  HENT: Negative for hearing loss.   Eyes: Negative for blurred vision and double vision.  Respiratory: Positive for cough. Negative for hemoptysis, shortness of breath and wheezing.   Cardiovascular: Negative for chest pain, palpitations and leg swelling.  Gastrointestinal: Negative for diarrhea, nausea and vomiting.  Musculoskeletal: Negative for myalgias.  Skin: Negative for itching and rash.  Neurological: Positive for weakness. Negative for dizziness, speech change and loss of consciousness.  Psychiatric/Behavioral: Negative for memory loss.   Objective:  Vitals:   11/22/18 1536  BP: 130/76  Pulse: 83  Temp: 98.3 F (36.8 C)  TempSrc: Oral  SpO2: 97%  Weight: 179 lb (81.2 kg)  Height: 5' 7.5" (1.715 m)    General: Well developed, well nourished, in no acute distress  Skin : Warm and dry.  Head: Normocephalic and atraumatic  Eyes: Sclera and conjunctiva clear; pupils round and reactive to light; extraocular movements intact  Ears:  External normal; canals clear; tympanic membranes normal  Oropharynx: Pink, supple. No suspicious lesions  Neck: Supple without thyromegaly, adenopathy  Lungs: Respirations unlabored; clear to auscultation bilaterally without wheeze, rales, rhonchi  CVS exam: normal rate and regular rhythm, S1 and S2 normal.  Extremities: No edema, cyanosis, clubbing  Vessels: Symmetric bilaterally  Neurologic: Alert and oriented; speech intact; face symmetrical; moves all extremities well; CNII-XII intact without focal deficit  Psychiatric: Normal mood and affect.  Assessment:  1. Cough   2. Malaise     Plan:   Normal VS, PE today Labs, imaging ordered for further evaluation of symptoms Home management, red  flags and return precautions including when to seek immediate care discussed and printed on AVS F/U with further recommendations pending test results  No follow-ups on file.  Orders Placed This Encounter  Procedures  . DG Chest 2 View    Standing Status:   Future    Standing Expiration Date:   01/21/2020    Order Specific Question:   Reason for Exam (SYMPTOM  OR DIAGNOSIS REQUIRED)    Answer:   cough, malaise, exposure to chemical    Order Specific Question:   Preferred imaging location?    Answer:   [facility]    Order Specific Question:   Radiology Contrast Protocol - do NOT remove file path    Answer:   [email or web address]  . CBC    Standing Status:   Future    Standing Expiration Date:   11/23/2019  . Comprehensive metabolic panel    Standing Status:   Future    Standing Expiration Date:   11/23/2019    Requested Prescriptions    No prescriptions requested or ordered in this encounter

## 2018-11-24 ENCOUNTER — Other Ambulatory Visit: Payer: Self-pay | Admitting: Nurse Practitioner

## 2018-11-24 DIAGNOSIS — J069 Acute upper respiratory infection, unspecified: Secondary | ICD-10-CM

## 2018-11-24 MED ORDER — BENZONATATE 100 MG PO CAPS: 100.0000 mg | ORAL_CAPSULE | Freq: Two times a day (BID) | ORAL | 0 refills | Status: DC | PRN

## 2018-12-19 ENCOUNTER — Ambulatory Visit: Payer: Medicare Other | Admitting: Nurse Practitioner

## 2018-12-20 ENCOUNTER — Ambulatory Visit: Payer: Medicare Other | Admitting: Nurse Practitioner

## 2018-12-28 ENCOUNTER — Encounter: Payer: Self-pay | Admitting: Gastroenterology

## 2019-01-02 ENCOUNTER — Other Ambulatory Visit: Payer: Self-pay | Admitting: Nurse Practitioner

## 2019-01-02 DIAGNOSIS — H6501 Acute serous otitis media, right ear: Secondary | ICD-10-CM

## 2019-01-02 DIAGNOSIS — I10 Essential (primary) hypertension: Secondary | ICD-10-CM

## 2019-01-02 DIAGNOSIS — E785 Hyperlipidemia, unspecified: Secondary | ICD-10-CM

## 2019-01-02 DIAGNOSIS — G4700 Insomnia, unspecified: Secondary | ICD-10-CM

## 2019-01-02 DIAGNOSIS — I6523 Occlusion and stenosis of bilateral carotid arteries: Secondary | ICD-10-CM

## 2019-01-02 MED ORDER — FLUTICASONE PROPIONATE 50 MCG/ACT NA SUSP: 1.0000 | Freq: Two times a day (BID) | NASAL | 0 refills | Status: DC | PRN

## 2019-01-02 MED ORDER — AMLODIPINE BESYLATE 10 MG PO TABS: 10.0000 mg | ORAL_TABLET | Freq: Every day | ORAL | 0 refills | Status: DC

## 2019-01-02 MED ORDER — TIZANIDINE HCL 4 MG PO TABS: 4.0000 mg | ORAL_TABLET | Freq: Three times a day (TID) | ORAL | 0 refills | Status: DC

## 2019-01-02 MED ORDER — SPIRONOLACTONE 25 MG PO TABS: 25.0000 mg | ORAL_TABLET | Freq: Two times a day (BID) | ORAL | 0 refills | Status: DC

## 2019-01-02 MED ORDER — ROSUVASTATIN CALCIUM 10 MG PO TABS: 10.0000 mg | ORAL_TABLET | Freq: Every day | ORAL | 0 refills | Status: DC

## 2019-01-02 NOTE — Telephone Encounter (Signed)
All refills sent except Zolpidem.  [place] Controlled Database Checked Last filled: 11/03/18 # 45 LOV w/you: 11/22/18 Next appt w/you: 04/30/19

## 2019-01-02 NOTE — Telephone Encounter (Signed)
Copied from [place] [id number]. Topic: Quick Communication - Rx Refill/Question >> Jan 02, 2019 12:53 PM Sallee Provencal, Gracee Ratterree B, NT wrote: Medication: labetalol (NORMODYNE) 300 MG tablet tiZANidine (ZANAFLEX) 4 MG tablet amLODipine (NORVASC) 10 MG tablet spironolactone (ALDACTONE) 25 MG tablet zolpidem (AMBIEN) 10 MG tablet rosuvastatin (CRESTOR) 10 MG tablet fluticasone (FLONASE) 50 MCG/ACT nasal spray  Has the patient contacted their pharmacy? yes (Agent: If no, request that the patient contact the pharmacy for the refill.) (Agent: If yes, when and what did the pharmacy advise?)  Preferred Pharmacy (with phone number or street name): [facility]  Agent: Please be advised that RX refills may take up to 3 business days. We ask that you follow-up with your pharmacy.

## 2019-01-03 MED ORDER — ZOLPIDEM TARTRATE 10 MG PO TABS: 5.0000 mg | ORAL_TABLET | Freq: Every evening | ORAL | 0 refills | Status: DC | PRN

## 2019-01-05 ENCOUNTER — Other Ambulatory Visit: Payer: Self-pay | Admitting: *Deleted

## 2019-01-05 DIAGNOSIS — M546 Pain in thoracic spine: Principal | ICD-10-CM

## 2019-01-05 DIAGNOSIS — G8929 Other chronic pain: Secondary | ICD-10-CM

## 2019-01-05 MED ORDER — MELOXICAM 7.5 MG PO TABS: 7.5000 mg | ORAL_TABLET | Freq: Every day | ORAL | 0 refills | Status: DC

## 2019-01-30 ENCOUNTER — Inpatient Hospital Stay: Admission: RE | Admit: 2019-01-30 | Payer: Medicare Other | Source: Ambulatory Visit

## 2019-03-02 ENCOUNTER — Encounter: Payer: Self-pay | Admitting: Gastroenterology

## 2019-03-16 ENCOUNTER — Ambulatory Visit (INDEPENDENT_AMBULATORY_CARE_PROVIDER_SITE_OTHER): Payer: Medicare Other | Admitting: Internal Medicine

## 2019-03-16 DIAGNOSIS — E611 Iron deficiency: Secondary | ICD-10-CM | POA: Diagnosis not present

## 2019-03-16 DIAGNOSIS — I6523 Occlusion and stenosis of bilateral carotid arteries: Secondary | ICD-10-CM

## 2019-03-16 DIAGNOSIS — E538 Deficiency of other specified B group vitamins: Secondary | ICD-10-CM | POA: Diagnosis not present

## 2019-03-16 DIAGNOSIS — I10 Essential (primary) hypertension: Secondary | ICD-10-CM | POA: Diagnosis not present

## 2019-03-16 DIAGNOSIS — E785 Hyperlipidemia, unspecified: Secondary | ICD-10-CM | POA: Diagnosis not present

## 2019-03-16 DIAGNOSIS — R739 Hyperglycemia, unspecified: Secondary | ICD-10-CM | POA: Diagnosis not present

## 2019-03-16 DIAGNOSIS — E559 Vitamin D deficiency, unspecified: Secondary | ICD-10-CM

## 2019-03-16 DIAGNOSIS — M542 Cervicalgia: Secondary | ICD-10-CM

## 2019-03-16 MED ORDER — PREDNISONE 10 MG PO TABS: ORAL_TABLET | ORAL | 0 refills | Status: DC

## 2019-03-16 MED ORDER — TRAMADOL HCL 50 MG PO TABS: 50.0000 mg | ORAL_TABLET | Freq: Four times a day (QID) | ORAL | 0 refills | Status: DC | PRN

## 2019-03-16 NOTE — Patient Instructions (Signed)
Please take all new medication as prescribed - the tramadol for pain, and the prednisone  Please continue all other medications as before, and refills have been done if requested.  Please have the pharmacy call with any other refills you may need.  Please continue your efforts at being more active, low cholesterol diet, and weight control.  You are otherwise up to date with prevention measures today.  Please keep your appointments with your specialists as you may have planned  Please go to the XRAY Department in the Basement (go straight as you get off the elevator) for the x-ray testing  Please go to the LAB in the Basement (turn left off the elevator) for the tests to be done at your convenience  You will be contacted by phone if any changes need to be made immediately.  Otherwise, you will receive a letter about your results with an explanation, but please check with MyChart first.  Please remember to sign up for MyChart if you have not done so, as this will be important to you in the future with finding out test results, communicating by private email, and scheduling acute appointments online when needed.  Please return in 6 months, or sooner if needed

## 2019-03-16 NOTE — Progress Notes (Signed)
Patient ID: Laura Poole, female   DOB: Dec 24, 1950, 68 y.o.   MRN: [id number]  Virtual Visit via Video Note  I connected with Laura Poole on 03/16/19 at 11:00 AM EDT by a video enabled telemedicine application and verified that I am speaking with the correct person using two identifiers.  Location: Patient: at home Provider: at office   I discussed the limitations of evaluation and management by telemedicine and the availability of in person appointments. The patient expressed understanding and agreed to proceed.  History of Present Illness: Here with c/o 2 wks onset right post lateral neck with radiation to the right shoulder but not RUE and denies numbness and weakness.  Pain is mod to occasionally, intermittent, spasm like, not better with ice and OTC pain patches; nothing else makes better or worse.  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  Pt denies chest pain, increased sob or doe, wheezing, orthopnea, PND, increased LE swelling, palpitations, dizziness or syncope.  Pt denies new neurological symptoms such as new headache, or facial or extremity weakness or numbness   Pt denies polydipsia, polyuria Past Medical History:  Diagnosis Date  . Bilateral leg cramps 09/01/2015  . Carotid stenosis 06/23/2014  . Chest pain   . Essential hypertension 01/07/2010   Qualifier: Diagnosis of  By: Linna Darner MD, Gwyndolyn Saxon    . GERD (gastroesophageal reflux disease)   . H/O: hysterectomy   . Heart murmur 03/30/2017  . History of breast cancer 2006  . History of colonic polyps   . HLD (hyperlipidemia)   . HTN (hypertension)   . Mitral regurgitation 08/05/2015  . Personal history of chemotherapy 2006   Left Breast Cancer  . Personal history of radiation therapy 2006   Left Breast Cancer  . Snoring 03/30/2017   Past Surgical History:  Procedure Laterality Date  . ABDOMINAL HYSTERECTOMY    . BREAST LUMPECTOMY  Left 2006  . COLONOSCOPY W/ POLYPECTOMY  2004, 2009   Dr Watt Climes  . TUBAL LIGATION      reports that she has never smoked. She has never used smokeless tobacco. She reports current alcohol use of about 2.0 standard drinks of alcohol per week. She reports that she does not use drugs. family history includes Aneurysm in her brother; Asthma in an other family member; Breast cancer in her paternal grandmother; Coronary artery disease in her mother; Heart attack (age of onset: 46) in her father; Hyperlipidemia in an other family member; Hypertension in her mother; Sudden death in an other family member. No Known Allergies Current Outpatient Medications on File Prior to Visit  Medication Sig Dispense Refill  . acetaminophen (TYLENOL) 500 MG tablet Take 500 mg by mouth every 6 (six) hours as needed for mild pain or headache.    [name] amLODipine (NORVASC) 10 MG tablet Take 1 tablet (10 mg total) by mouth daily. 90 tablet 0  . aspirin 81 MG tablet Take 1 tablet (81 mg total) by mouth daily. 30 tablet 6  . benzonatate (TESSALON) 100 MG capsule Take 1 capsule (100 mg total) by mouth 2 (two) times daily as needed for cough. 20 capsule 0  . Coenzyme Q10 (CO Q 10) 100 MG CAPS Take 1 capsule by mouth every morning.    . diphenhydrAMINE (BENADRYL) 25 mg capsule Take 25 mg by mouth every 6 (six) hours as needed for allergies.    . famotidine (PEPCID AC) 10 MG tablet Take  1 tablet (10 mg total) by mouth 2 (two) times daily. 60 tablet 1  . fluticasone (FLONASE) 50 MCG/ACT nasal spray Place 1 spray into both nostrils 2 (two) times daily as needed for allergies or rhinitis. 16 g 0  . labetalol (NORMODYNE) 300 MG tablet Take 1 tablet (300 mg total) by mouth daily. 90 tablet 1  . magnesium oxide (MAG-OX) 400 MG tablet 1 tablet by mouth at bedtime    . meloxicam (MOBIC) 7.5 MG tablet Take 1 tablet (7.5 mg total) by mouth daily. 90 tablet 0  . rosuvastatin (CRESTOR) 10 MG tablet Take 1 tablet (10 mg total) by mouth daily. 90  tablet 0  . spironolactone (ALDACTONE) 25 MG tablet Take 1 tablet (25 mg total) by mouth 2 (two) times daily. 90 tablet 0  . tiZANidine (ZANAFLEX) 4 MG tablet Take 1 tablet (4 mg total) by mouth 3 (three) times daily. 270 tablet 0  . vitamin C (ASCORBIC ACID) 500 MG tablet Take 500 mg by mouth daily.    [name] zolpidem (AMBIEN) 10 MG tablet Take 0.5 tablets (5 mg total) by mouth at bedtime as needed for sleep. 45 tablet 0   No current facility-administered medications on file prior to visit.     Observations/Objective: Alert, NAD, appropriate mood and affect, resps normal, cn 2-12 intact, moves all 4s, no visible rash or swelling Lab Results  Component Value Date   WBC 7.1 11/22/2018   HGB 12.0 11/22/2018   HCT 36.2 11/22/2018   PLT 319.0 11/22/2018   GLUCOSE 87 11/22/2018   CHOL 160 08/10/2018   TRIG 77.0 08/10/2018   HDL 56.60 08/10/2018   LDLCALC 88 08/10/2018   ALT 14 11/22/2018   AST 15 11/22/2018   NA 140 11/22/2018   K 3.9 11/22/2018   CL 104 11/22/2018   CREATININE 0.89 11/22/2018   BUN 13 11/22/2018   CO2 29 11/22/2018   TSH 1.88 04/21/2018   INR 1.1 (H) 01/25/2012   HGBA1C 6.3 05/16/2015   Assessment and Plan: See notes  Follow Up Instructions: See notes  I discussed the assessment and treatment plan with the patient. The patient was provided an opportunity to ask questions and all were answered. The patient agreed with the plan and demonstrated an understanding of the instructions.   The patient was advised to call back or seek an in-person evaluation if the symptoms worsen or if the condition fails to improve as anticipated  Cathlean Cower, MD

## 2019-03-18 ENCOUNTER — Encounter: Payer: Self-pay | Admitting: Internal Medicine

## 2019-03-18 NOTE — Assessment & Plan Note (Signed)
stable overall by history and exam, recent data reviewed with pt, and pt to continue medical treatment as before,  to f/u any worsening symptoms or concerns  

## 2019-03-18 NOTE — Assessment & Plan Note (Signed)
C/w msk strain but cant r/o underlyging c spine djd or ddd; for c spine films, tramadol prn, and predpac trial,  to f/u any worsening symptoms or concerns, consider MRI

## 2019-03-19 ENCOUNTER — Other Ambulatory Visit: Payer: Self-pay

## 2019-03-19 ENCOUNTER — Encounter: Payer: Self-pay | Admitting: Internal Medicine

## 2019-03-19 ENCOUNTER — Other Ambulatory Visit: Payer: Self-pay | Admitting: Internal Medicine

## 2019-03-19 ENCOUNTER — Other Ambulatory Visit (INDEPENDENT_AMBULATORY_CARE_PROVIDER_SITE_OTHER): Payer: Medicare Other

## 2019-03-19 ENCOUNTER — Ambulatory Visit (INDEPENDENT_AMBULATORY_CARE_PROVIDER_SITE_OTHER)
Admission: RE | Admit: 2019-03-19 | Discharge: 2019-03-19 | Disposition: A | Discharge status: Home / Self Care | Payer: Medicare Other | Source: Ambulatory Visit | Attending: Internal Medicine | Admitting: Internal Medicine

## 2019-03-19 DIAGNOSIS — E611 Iron deficiency: Secondary | ICD-10-CM

## 2019-03-19 DIAGNOSIS — E785 Hyperlipidemia, unspecified: Secondary | ICD-10-CM

## 2019-03-19 DIAGNOSIS — E559 Vitamin D deficiency, unspecified: Secondary | ICD-10-CM

## 2019-03-19 DIAGNOSIS — M542 Cervicalgia: Secondary | ICD-10-CM

## 2019-03-19 DIAGNOSIS — E538 Deficiency of other specified B group vitamins: Secondary | ICD-10-CM | POA: Diagnosis not present

## 2019-03-19 DIAGNOSIS — R739 Hyperglycemia, unspecified: Secondary | ICD-10-CM

## 2019-03-19 DIAGNOSIS — R3129 Other microscopic hematuria: Secondary | ICD-10-CM

## 2019-03-19 LAB — LIPID PANEL
Cholesterol: 171 mg/dL (ref 0–200)
HDL: 65.5 mg/dL (ref >39.00)
LDL Cholesterol: 81 mg/dL (ref 0–99)
NonHDL: 105.3
Total CHOL/HDL Ratio: 3
Triglycerides: 122 mg/dL (ref 0.0–149.0)
VLDL: 24.4 mg/dL (ref 0.0–40.0)

## 2019-03-19 LAB — URINALYSIS, ROUTINE W REFLEX MICROSCOPIC
Bilirubin Urine: NEGATIVE
Ketones, ur: NEGATIVE
Leukocytes,Ua: NEGATIVE
Nitrite: NEGATIVE
Specific Gravity, Urine: >=1.03 — AB (ref 1.000–1.030)
Total Protein, Urine: NEGATIVE
Urine Glucose: NEGATIVE
Urobilinogen, UA: 0.2 (ref 0.0–1.0)
pH: 6 (ref 5.0–8.0)

## 2019-03-19 LAB — CBC WITH DIFFERENTIAL/PLATELET
Basophils Absolute: 0.1 10*3/uL (ref 0.0–0.1)
Basophils Relative: 0.6 % (ref 0.0–3.0)
Eosinophils Absolute: 0.1 10*3/uL (ref 0.0–0.7)
Eosinophils Relative: 0.5 % (ref 0.0–5.0)
HCT: 37.2 % (ref 36.0–46.0)
Hemoglobin: 12.2 g/dL (ref 12.0–15.0)
Lymphocytes Relative: 20.8 % (ref 12.0–46.0)
Lymphs Abs: 2.1 10*3/uL (ref 0.7–4.0)
MCHC: 32.8 g/dL (ref 30.0–36.0)
MCV: 88.7 fl (ref 78.0–100.0)
Monocytes Absolute: 0.8 10*3/uL (ref 0.1–1.0)
Monocytes Relative: 8 % (ref 3.0–12.0)
Neutro Abs: 6.9 10*3/uL (ref 1.4–7.7)
Neutrophils Relative %: 70.1 % (ref 43.0–77.0)
Platelets: 322 10*3/uL (ref 150.0–400.0)
RBC: 4.2 Mil/uL (ref 3.87–5.11)
RDW: 15.5 % (ref 11.5–15.5)
WBC: 9.9 10*3/uL (ref 4.0–10.5)

## 2019-03-19 LAB — BASIC METABOLIC PANEL
BUN: 17 mg/dL (ref 6–23)
CO2: 29 mEq/L (ref 19–32)
Calcium: 9.3 mg/dL (ref 8.4–10.5)
Chloride: 103 mEq/L (ref 96–112)
Creatinine, Ser: 0.85 mg/dL (ref 0.40–1.20)
GFR: 80.98 mL/min (ref >60.00)
Glucose, Bld: 94 mg/dL (ref 70–99)
Potassium: 3.5 mEq/L (ref 3.5–5.1)
Sodium: 141 mEq/L (ref 135–145)

## 2019-03-19 LAB — VITAMIN D 25 HYDROXY (VIT D DEFICIENCY, FRACTURES): VITD: 41.89 ng/mL (ref 30.00–100.00)

## 2019-03-19 LAB — HEPATIC FUNCTION PANEL
ALT: 17 U/L (ref 0–35)
AST: 18 U/L (ref 0–37)
Albumin: 4.3 g/dL (ref 3.5–5.2)
Alkaline Phosphatase: 58 U/L (ref 39–117)
Bilirubin, Direct: 0 mg/dL (ref 0.0–0.3)
Total Bilirubin: 0.3 mg/dL (ref 0.2–1.2)
Total Protein: 7.2 g/dL (ref 6.0–8.3)

## 2019-03-19 LAB — IBC PANEL
Iron: 67 ug/dL (ref 42–145)
Saturation Ratios: 18.8 % — ABNORMAL LOW (ref 20.0–50.0)
Transferrin: 254 mg/dL (ref 212.0–360.0)

## 2019-03-19 LAB — VITAMIN B12: Vitamin B-12: 196 pg/mL — ABNORMAL LOW (ref 211–911)

## 2019-03-19 LAB — TSH: TSH: 2.8 u[IU]/mL (ref 0.35–4.50)

## 2019-03-19 LAB — HEMOGLOBIN A1C: Hgb A1c MFr Bld: 6.5 % (ref 4.6–6.5)

## 2019-03-20 ENCOUNTER — Telehealth: Payer: Self-pay

## 2019-03-20 NOTE — Telephone Encounter (Signed)
Called pt, LVM.   Copied from [place] [phone]. Topic: Quick Communication - Lab Results (Clinic Use ONLY) >> Mar 20, 2019  1:25 PM [name] wrote: Pt want to know results of her labs and xray. Please call pt.

## 2019-03-20 NOTE — Telephone Encounter (Signed)
Called pt, LVM.   CRM created.  

## 2019-03-20 NOTE — Telephone Encounter (Signed)
-----   Message from Biagio Borg, MD sent at 03/19/2019 12:43 PM EDT ----- Letter sent, cont same tx except  The test results show that your current treatment is OK, except the Vitamin B12 level is low, as well as a small amount of blood found in the urine testing.  These are new problems for you and we need to:  1)  Start monthly B12 shots until your next visit when we may be able to change to B12 pills 2)  Refer to Urology for the small amount of blood in the urine to try to determine the cause, and make sure that there is no cancer   Brayen Bunn to please inform pt, I will do referral and please start monthly b12 shots

## 2019-03-22 ENCOUNTER — Encounter: Payer: Self-pay | Admitting: Internal Medicine

## 2019-03-22 ENCOUNTER — Ambulatory Visit (INDEPENDENT_AMBULATORY_CARE_PROVIDER_SITE_OTHER): Payer: Medicare Other | Admitting: Internal Medicine

## 2019-03-22 DIAGNOSIS — E559 Vitamin D deficiency, unspecified: Secondary | ICD-10-CM

## 2019-03-22 DIAGNOSIS — I6523 Occlusion and stenosis of bilateral carotid arteries: Secondary | ICD-10-CM

## 2019-03-22 DIAGNOSIS — E538 Deficiency of other specified B group vitamins: Secondary | ICD-10-CM | POA: Insufficient documentation

## 2019-03-22 DIAGNOSIS — R3129 Other microscopic hematuria: Secondary | ICD-10-CM | POA: Insufficient documentation

## 2019-03-22 DIAGNOSIS — M542 Cervicalgia: Secondary | ICD-10-CM

## 2019-03-22 NOTE — Patient Instructions (Signed)
Please start the B12 shots as recommended  You will be contacted regarding the referral for: Urology  Please continue all other medications as before, including OK to start the tramadol as prescribed  Please have the pharmacy call with any other refills you may need.  Please continue your efforts at being more active, low cholesterol diet, and weight control.  Please keep your appointments with your specialists as you may have planned

## 2019-03-22 NOTE — Assessment & Plan Note (Signed)
Asympt, d/w pt, for urology referral to r/o cause such as malignancy

## 2019-03-22 NOTE — Assessment & Plan Note (Signed)
Persistent pain, ok to start the tramadol prn, f/u for persistent pain to consider MRI in 2-4 wks

## 2019-03-22 NOTE — Progress Notes (Signed)
Patient ID: Laura Poole, female   DOB: Dec 07, 1950, 68 y.o.   MRN: [id number]  Virtual Visit via Video Note  I connected with Laura Poole on 03/22/19 at 10:40 AM EDT by a video enabled telemedicine application and verified that I am speaking with the correct person using two identifiers.  Location: Patient: at home Provider: at office   I discussed the limitations of evaluation and management by telemedicine and the availability of in person appointments. The patient expressed understanding and agreed to proceed.  History of Present Illness: Here to f/u, Pt denies chest pain, increasing sob or doe, wheezing, orthopnea, PND, increased LE swelling, palpitations, dizziness or syncope.  Pt denies new neurological symptoms such as new headache, or facial or extremity weakness or numbness.  Pt denies polydipsia, polyuria, or low sugar episode.  Pt states overall good compliance with meds, Denies urinary symptoms such as dysuria, frequency, urgency, flank pain, hematuria or n/v, fever, chills.  Agrees to start B12 shots soon, and this has been an issue with several in her family as well.  Also Pt continues to have recurring neck pain without change in severity despite the mobic, muscle relaxer and a couple of days of prendisone (has not yet started tramadol), but no bowel or bladder change, fever, wt loss,  worsening extremity pain/numbness/weakness, gait change or falls. Past Medical History:  Diagnosis Date  . Bilateral leg cramps 09/01/2015  . Carotid stenosis 06/23/2014  . Chest pain   . Essential hypertension 01/07/2010   Qualifier: Diagnosis of  By: Linna Darner MD, Gwyndolyn Saxon    . GERD (gastroesophageal reflux disease)   . H/O: hysterectomy   . Heart murmur 03/30/2017  . History of breast cancer 2006  . History of colonic polyps   . HLD (hyperlipidemia)   . HTN (hypertension)   . Mitral regurgitation 08/05/2015  . Personal history of chemotherapy 2006   Left Breast Cancer  . Personal history  of radiation therapy 2006   Left Breast Cancer  . Snoring 03/30/2017   Past Surgical History:  Procedure Laterality Date  . ABDOMINAL HYSTERECTOMY    . BREAST LUMPECTOMY Left 2006  . COLONOSCOPY W/ POLYPECTOMY  2004, 2009   Dr Watt Climes  . TUBAL LIGATION      reports that she has never smoked. She has never used smokeless tobacco. She reports current alcohol use of about 2.0 standard drinks of alcohol per week. She reports that she does not use drugs. family history includes Aneurysm in her brother; Asthma in an other family member; Breast cancer in her paternal grandmother; Coronary artery disease in her mother; Heart attack (age of onset: 50) in her father; Hyperlipidemia in an other family member; Hypertension in her mother; Sudden death in an other family member. No Known Allergies Current Outpatient Medications on File Prior to Visit  Medication Sig Dispense Refill  . acetaminophen (TYLENOL) 500 MG tablet Take 500 mg by mouth every 6 (six) hours as needed for mild pain or headache.    [name] amLODipine (NORVASC) 10 MG tablet Take 1 tablet (10 mg total) by mouth daily. 90 tablet 0  . aspirin 81 MG tablet Take 1 tablet (81 mg total) by mouth daily. 30 tablet 6  . benzonatate (TESSALON) 100 MG capsule Take 1 capsule (100 mg total) by mouth 2 (two) times daily as needed for cough. 20 capsule 0  . Coenzyme Q10 (CO Q 10) 100 MG CAPS Take 1 capsule by mouth every morning.    . diphenhydrAMINE (BENADRYL) 25 mg  capsule Take 25 mg by mouth every 6 (six) hours as needed for allergies.    . famotidine (PEPCID AC) 10 MG tablet Take 1 tablet (10 mg total) by mouth 2 (two) times daily. 60 tablet 1  . fluticasone (FLONASE) 50 MCG/ACT nasal spray Place 1 spray into both nostrils 2 (two) times daily as needed for allergies or rhinitis. 16 g 0  . labetalol (NORMODYNE) 300 MG tablet Take 1 tablet (300 mg total) by mouth daily. 90 tablet 1  . magnesium oxide (MAG-OX) 400 MG tablet 1 tablet by mouth at bedtime    .  meloxicam (MOBIC) 7.5 MG tablet Take 1 tablet (7.5 mg total) by mouth daily. 90 tablet 0  . predniSONE (DELTASONE) 10 MG tablet 3 tabs by mouth per day for 3 days,2tabs per day for 3 days,1tab per day for 3 days 18 tablet 0  . rosuvastatin (CRESTOR) 10 MG tablet Take 1 tablet (10 mg total) by mouth daily. 90 tablet 0  . spironolactone (ALDACTONE) 25 MG tablet Take 1 tablet (25 mg total) by mouth 2 (two) times daily. 90 tablet 0  . tiZANidine (ZANAFLEX) 4 MG tablet Take 1 tablet (4 mg total) by mouth 3 (three) times daily. 270 tablet 0  . traMADol (ULTRAM) 50 MG tablet Take 1 tablet (50 mg total) by mouth every 6 (six) hours as needed. 30 tablet 0  . vitamin C (ASCORBIC ACID) 500 MG tablet Take 500 mg by mouth daily.    [name] zolpidem (AMBIEN) 10 MG tablet Take 0.5 tablets (5 mg total) by mouth at bedtime as needed for sleep. 45 tablet 0   No current facility-administered medications on file prior to visit.    Observations/Objective: Alert, NAD, appropriate mood and affect, resps normal, cn 2-12 intact, moves all 4s, no visible rash or swelling Lab Results  Component Value Date   WBC 9.9 03/19/2019   HGB 12.2 03/19/2019   HCT 37.2 03/19/2019   PLT 322.0 03/19/2019   GLUCOSE 94 03/19/2019   CHOL 171 03/19/2019   TRIG 122.0 03/19/2019   HDL 65.50 03/19/2019   LDLCALC 81 03/19/2019   ALT 17 03/19/2019   AST 18 03/19/2019   NA 141 03/19/2019   K 3.5 03/19/2019   CL 103 03/19/2019   CREATININE 0.85 03/19/2019   BUN 17 03/19/2019   CO2 29 03/19/2019   TSH 2.80 03/19/2019   INR 1.1 (H) 01/25/2012   HGBA1C 6.5 03/19/2019   Assessment and Plan: See notes  Follow Up Instructions: See notes   I discussed the assessment and treatment plan with the patient. The patient was provided an opportunity to ask questions and all were answered. The patient agreed with the plan and demonstrated an understanding of the instructions.   The patient was advised to call back or seek an in-person  evaluation if the symptoms worsen or if the condition fails to improve as anticipated.   Cathlean Cower, MD

## 2019-03-22 NOTE — Assessment & Plan Note (Signed)
D/w pt, to start B12 IM monthly, with f/u lab at 6-12 mo

## 2019-03-22 NOTE — Assessment & Plan Note (Signed)
stable overall by history and exam, recent data reviewed with pt, and pt to continue medical treatment as before,  to f/u any worsening symptoms or concerns  

## 2019-03-23 ENCOUNTER — Other Ambulatory Visit: Payer: Self-pay

## 2019-03-23 ENCOUNTER — Ambulatory Visit (INDEPENDENT_AMBULATORY_CARE_PROVIDER_SITE_OTHER): Payer: Medicare Other

## 2019-03-23 DIAGNOSIS — E538 Deficiency of other specified B group vitamins: Secondary | ICD-10-CM | POA: Diagnosis not present

## 2019-03-23 MED ORDER — CYANOCOBALAMIN 1000 MCG/ML IJ SOLN
1000.0000 ug | Freq: Once | INTRAMUSCULAR | Status: AC
  Administered 2019-03-23: 1000 ug via INTRAMUSCULAR

## 2019-03-23 NOTE — Progress Notes (Signed)
Medical screening examination/treatment/procedure(s) were performed by non-physician practitioner and as supervising physician I was immediately available for consultation/collaboration. I agree with above. James John, MD   

## 2019-04-09 ENCOUNTER — Ambulatory Visit: Payer: Medicare Other | Admitting: Internal Medicine

## 2019-04-11 ENCOUNTER — Encounter: Payer: Medicare Other | Admitting: Gastroenterology

## 2019-04-12 DIAGNOSIS — N644 Mastodynia: Secondary | ICD-10-CM | POA: Diagnosis not present

## 2019-04-20 ENCOUNTER — Other Ambulatory Visit: Payer: Self-pay

## 2019-04-20 ENCOUNTER — Ambulatory Visit (INDEPENDENT_AMBULATORY_CARE_PROVIDER_SITE_OTHER): Payer: Medicare Other

## 2019-04-20 DIAGNOSIS — E538 Deficiency of other specified B group vitamins: Secondary | ICD-10-CM

## 2019-04-20 MED ORDER — CYANOCOBALAMIN 1000 MCG/ML IJ SOLN
1000.0000 ug | Freq: Once | INTRAMUSCULAR | Status: AC
  Administered 2019-04-20: 1000 ug via INTRAMUSCULAR

## 2019-04-20 NOTE — Progress Notes (Signed)
Medical screening examination/treatment/procedure(s) were performed by non-physician practitioner and as supervising physician I was immediately available for consultation/collaboration. I agree with above. Shirleymae Hauth, MD   

## 2019-04-23 DIAGNOSIS — Z20828 Contact with and (suspected) exposure to other viral communicable diseases: Secondary | ICD-10-CM | POA: Diagnosis not present

## 2019-04-30 ENCOUNTER — Ambulatory Visit: Payer: Medicare Other | Admitting: Nurse Practitioner

## 2019-05-01 ENCOUNTER — Other Ambulatory Visit: Payer: Self-pay

## 2019-05-01 DIAGNOSIS — Z20822 Contact with and (suspected) exposure to covid-19: Secondary | ICD-10-CM

## 2019-05-03 DIAGNOSIS — Z20828 Contact with and (suspected) exposure to other viral communicable diseases: Secondary | ICD-10-CM | POA: Diagnosis not present

## 2019-05-03 LAB — NOVEL CORONAVIRUS, NAA: SARS-CoV-2, NAA: NOT DETECTED

## 2019-05-04 ENCOUNTER — Other Ambulatory Visit: Payer: Self-pay | Admitting: *Deleted

## 2019-05-04 DIAGNOSIS — G8929 Other chronic pain: Secondary | ICD-10-CM

## 2019-05-04 MED ORDER — MELOXICAM 7.5 MG PO TABS: 7.5000 mg | ORAL_TABLET | Freq: Every day | ORAL | 1 refills | Status: DC

## 2019-05-07 ENCOUNTER — Other Ambulatory Visit: Payer: Self-pay

## 2019-05-07 ENCOUNTER — Telehealth: Payer: Self-pay

## 2019-05-07 DIAGNOSIS — G4700 Insomnia, unspecified: Secondary | ICD-10-CM

## 2019-05-07 MED ORDER — ZOLPIDEM TARTRATE 10 MG PO TABS: 5.0000 mg | ORAL_TABLET | Freq: Every evening | ORAL | 1 refills | Status: DC | PRN

## 2019-05-07 NOTE — Telephone Encounter (Signed)
Done erx 

## 2019-05-07 NOTE — Telephone Encounter (Signed)
Pt has been informed of results and expressed understanding.  °

## 2019-05-07 NOTE — Telephone Encounter (Signed)
-----   Message from Biagio Borg, MD sent at 05/05/2019  8:14 PM EDT ----- Left message on MyChart, pt to cont same tx   Laura Poole to please inform pt, COVID neg

## 2019-05-11 DIAGNOSIS — Z1159 Encounter for screening for other viral diseases: Secondary | ICD-10-CM | POA: Diagnosis not present

## 2019-05-17 DIAGNOSIS — Z1159 Encounter for screening for other viral diseases: Secondary | ICD-10-CM | POA: Diagnosis not present

## 2019-05-18 ENCOUNTER — Ambulatory Visit (INDEPENDENT_AMBULATORY_CARE_PROVIDER_SITE_OTHER): Payer: Medicare Other

## 2019-05-18 DIAGNOSIS — E538 Deficiency of other specified B group vitamins: Secondary | ICD-10-CM | POA: Diagnosis not present

## 2019-05-18 MED ORDER — CYANOCOBALAMIN 1000 MCG/ML IJ SOLN
1000.0000 ug | Freq: Once | INTRAMUSCULAR | Status: AC
  Administered 2019-05-18: 1000 ug via INTRAMUSCULAR

## 2019-05-18 NOTE — Progress Notes (Signed)
Medical screening examination/treatment/procedure(s) were performed by non-physician practitioner and as supervising physician I was immediately available for consultation/collaboration. I agree with above. Xzaria Teo, MD   

## 2019-05-23 DIAGNOSIS — Z1159 Encounter for screening for other viral diseases: Secondary | ICD-10-CM | POA: Diagnosis not present

## 2019-05-31 DIAGNOSIS — Z20828 Contact with and (suspected) exposure to other viral communicable diseases: Secondary | ICD-10-CM | POA: Diagnosis not present

## 2019-06-05 ENCOUNTER — Encounter: Payer: Self-pay | Admitting: Internal Medicine

## 2019-06-05 MED ORDER — FEXOFENADINE HCL 180 MG PO TABS: 180.0000 mg | ORAL_TABLET | Freq: Every day | ORAL | 11 refills | Status: DC

## 2019-06-05 MED ORDER — TRIAMCINOLONE ACETONIDE 55 MCG/ACT NA AERO: 2.0000 | INHALATION_SPRAY | Freq: Every day | NASAL | 12 refills | Status: DC

## 2019-06-08 ENCOUNTER — Telehealth: Payer: Self-pay

## 2019-06-08 DIAGNOSIS — Z021 Encounter for pre-employment examination: Secondary | ICD-10-CM

## 2019-06-08 NOTE — Addendum Note (Signed)
Addended by: Biagio Borg on: 06/08/2019 11:47 AM   Modules accepted: Orders

## 2019-06-08 NOTE — Telephone Encounter (Signed)
Order is done.

## 2019-06-08 NOTE — Telephone Encounter (Signed)
Copied from [place] [id number]. Topic: General - Other >> Jun 08, 2019  8:55 AM [name] wrote: Reason for CRM: Pt called and is requesting to have TB test done. Pt states she would like it sent through mychart. Please advise. >> Jun 08, 2019  9:12 AM [name] A wrote: TB test appointment has been made. Patient is also requesting to leave a urine sample for a drug test. She is applying for a job and they need it. Can the order be put in for when she comes on Monday?

## 2019-06-11 ENCOUNTER — Ambulatory Visit: Payer: Medicare Other

## 2019-06-15 ENCOUNTER — Ambulatory Visit (INDEPENDENT_AMBULATORY_CARE_PROVIDER_SITE_OTHER): Payer: Medicare Other

## 2019-06-15 DIAGNOSIS — Z23 Encounter for immunization: Secondary | ICD-10-CM | POA: Diagnosis not present

## 2019-06-15 DIAGNOSIS — E538 Deficiency of other specified B group vitamins: Secondary | ICD-10-CM | POA: Diagnosis not present

## 2019-06-15 MED ORDER — CYANOCOBALAMIN 1000 MCG/ML IJ SOLN
1000.0000 ug | Freq: Once | INTRAMUSCULAR | Status: AC
  Administered 2019-06-15: 1000 ug via INTRAMUSCULAR

## 2019-06-15 NOTE — Addendum Note (Signed)
Addended by: Aviva Signs M on: 06/15/2019 02:46 PM   Modules accepted: Orders

## 2019-06-15 NOTE — Addendum Note (Signed)
Addended by: Ander Slade on: 06/15/2019 12:02 PM   Modules accepted: Orders

## 2019-06-15 NOTE — Progress Notes (Signed)
Medical screening examination/treatment/procedure(s) were performed by non-physician practitioner and as supervising physician I was immediately available for consultation/collaboration. I agree with above. Jeanean Hollett, MD   

## 2019-06-20 DIAGNOSIS — Z119 Encounter for screening for infectious and parasitic diseases, unspecified: Secondary | ICD-10-CM | POA: Diagnosis not present

## 2019-06-23 ENCOUNTER — Encounter: Payer: Self-pay | Admitting: Internal Medicine

## 2019-06-25 NOTE — Telephone Encounter (Signed)
Staff to send immunization record, or other proof of flu vaccine recently, thanks

## 2019-06-26 NOTE — Telephone Encounter (Signed)
Immunization record has been mailed

## 2019-07-09 ENCOUNTER — Other Ambulatory Visit: Payer: Self-pay

## 2019-07-09 DIAGNOSIS — Z20822 Contact with and (suspected) exposure to covid-19: Secondary | ICD-10-CM

## 2019-07-10 ENCOUNTER — Other Ambulatory Visit: Payer: Self-pay | Admitting: Obstetrics and Gynecology

## 2019-07-10 DIAGNOSIS — Z1231 Encounter for screening mammogram for malignant neoplasm of breast: Secondary | ICD-10-CM

## 2019-07-10 LAB — NOVEL CORONAVIRUS, NAA: SARS-CoV-2, NAA: NOT DETECTED

## 2019-07-10 LAB — SPECIMEN STATUS REPORT

## 2019-07-13 ENCOUNTER — Ambulatory Visit (INDEPENDENT_AMBULATORY_CARE_PROVIDER_SITE_OTHER): Payer: Medicare Other

## 2019-07-13 DIAGNOSIS — E538 Deficiency of other specified B group vitamins: Secondary | ICD-10-CM | POA: Diagnosis not present

## 2019-07-13 MED ORDER — CYANOCOBALAMIN 1000 MCG/ML IJ SOLN
1000.0000 ug | Freq: Once | INTRAMUSCULAR | Status: AC
  Administered 2019-07-13: 1000 ug via INTRAMUSCULAR

## 2019-07-13 NOTE — Progress Notes (Signed)
Medical screening examination/treatment/procedure(s) were performed by non-physician practitioner and as supervising physician I was immediately available for consultation/collaboration. I agree with above. Shannyn Jankowiak, MD   

## 2019-07-28 ENCOUNTER — Encounter: Payer: Self-pay | Admitting: Internal Medicine

## 2019-07-30 MED ORDER — TIZANIDINE HCL 4 MG PO TABS: 4.0000 mg | ORAL_TABLET | Freq: Three times a day (TID) | ORAL | 1 refills | Status: DC

## 2019-08-08 ENCOUNTER — Ambulatory Visit (INDEPENDENT_AMBULATORY_CARE_PROVIDER_SITE_OTHER): Payer: Medicare Other | Admitting: Internal Medicine

## 2019-08-08 ENCOUNTER — Encounter: Payer: Self-pay | Admitting: Internal Medicine

## 2019-08-08 DIAGNOSIS — M545 Low back pain, unspecified: Secondary | ICD-10-CM | POA: Insufficient documentation

## 2019-08-08 DIAGNOSIS — R350 Frequency of micturition: Secondary | ICD-10-CM | POA: Diagnosis not present

## 2019-08-08 DIAGNOSIS — G4700 Insomnia, unspecified: Secondary | ICD-10-CM

## 2019-08-08 DIAGNOSIS — G8929 Other chronic pain: Secondary | ICD-10-CM | POA: Diagnosis not present

## 2019-08-08 DIAGNOSIS — E538 Deficiency of other specified B group vitamins: Secondary | ICD-10-CM | POA: Diagnosis not present

## 2019-08-08 DIAGNOSIS — J309 Allergic rhinitis, unspecified: Secondary | ICD-10-CM | POA: Insufficient documentation

## 2019-08-08 DIAGNOSIS — I6523 Occlusion and stenosis of bilateral carotid arteries: Secondary | ICD-10-CM

## 2019-08-08 MED ORDER — FEXOFENADINE HCL 180 MG PO TABS: 180.0000 mg | ORAL_TABLET | Freq: Every day | ORAL | 3 refills | Status: DC

## 2019-08-08 MED ORDER — TIZANIDINE HCL 2 MG PO TABS: 2.0000 mg | ORAL_TABLET | Freq: Four times a day (QID) | ORAL | 1 refills | Status: DC | PRN

## 2019-08-08 NOTE — Assessment & Plan Note (Signed)
For b12 1000 mcg IM today

## 2019-08-08 NOTE — Assessment & Plan Note (Signed)
Mild, intermittent after 2 son's deaths of friends of hers,  to f/u any worsening symptoms or concerns

## 2019-08-08 NOTE — Assessment & Plan Note (Addendum)
Etiology unclear, for urine studies r/o UTI  Note:  Total time for pt hx, exam, review of record with pt in the room, determination of diagnoses and plan for further eval and tx is > 40 min, with over 50% spent in coordination and counseling of patient including the differential dx, tx, further evaluation and other management of urinary frequency, allergic rhinitis, b12 deficiency, LBP, insomnia

## 2019-08-08 NOTE — Patient Instructions (Signed)
OK to change the tizanidine 4 mg to the 2 mg as needed  Ok to restart the allegra for allergies  Ok to continue the B12 as you have planned  Please continue all other medications as before, and refills have been done if requested.  Please have the pharmacy call with any other refills you may need.  Please continue your efforts at being more active, low cholesterol diet, and weight control.  Please keep your appointments with your specialists as you may have planned  Please go to the LAB in the Basement (turn left off the elevator) for the tests to be done  - just the urine testing  You will be contacted by phone if any changes need to be made immediately.  Otherwise, you will receive a letter about your results with an explanation, but please check with MyChart first.  Please remember to sign up for MyChart if you have not done so, as this will be important to you in the future with finding out test results, communicating by private email, and scheduling acute appointments online when needed.

## 2019-08-08 NOTE — Assessment & Plan Note (Signed)
Mild to mod, for allergra prn,  to f/u any worsening symptoms or concerns 

## 2019-08-08 NOTE — Progress Notes (Signed)
Patient ID: Laura Poole, female   DOB: 1950-10-24, 68 y.o.   MRN: [id number]  Virtual Visit via Video Note  I connected with Laura Poole on 08/08/19 at  3:20 PM EDT by a video enabled telemedicine application and verified that I am speaking with the correct person using two identifiers.  Location: Patient: at home Provider: at office   I discussed the limitations of evaluation and management by telemedicine and the availability of in person appointments. The patient expressed understanding and agreed to proceed.  History of Present Illness: Here to f/u; overall doing ok,  Pt denies chest pain, increasing sob or doe, wheezing, orthopnea, PND, increased LE swelling, palpitations, dizziness or syncope.  Pt denies new neurological symptoms such as new headache, or facial or extremity weakness or numbness.  Pt denies polydipsia, polyuria.  Does have several wks ongoing nasal allergy symptoms with clearish congestion, itch and sneezing, without fever, pain, ST, cough, swelling or wheezing.  Pt continues to have recurring LBP without change in severity, bowel or bladder change, fever, wt loss,  worsening LE pain/numbness/weakness, gait change or falls,, but tizanidine 4 mg too strong, causes dizziness, and can only take at bedtime.  Also has urinary frequency x 3 days, but Denies urinary symptoms such as dysuria, urgency, flank pain, hematuria or n/v, fever, chills.  Due for b12 today Past Medical History:  Diagnosis Date  . Bilateral leg cramps 09/01/2015  . Carotid stenosis 06/23/2014  . Chest pain   . Essential hypertension 01/07/2010   Qualifier: Diagnosis of  By: Linna Darner MD, Gwyndolyn Saxon    . GERD (gastroesophageal reflux disease)   . H/O: hysterectomy   . Heart murmur 03/30/2017  . History of breast cancer 2006  . History of colonic polyps   . HLD (hyperlipidemia)   . HTN (hypertension)   . Mitral regurgitation 08/05/2015  . Personal history of chemotherapy 2006   Left Breast Cancer  .  Personal history of radiation therapy 2006   Left Breast Cancer  . Snoring 03/30/2017   Past Surgical History:  Procedure Laterality Date  . ABDOMINAL HYSTERECTOMY    . BREAST LUMPECTOMY Left 2006  . COLONOSCOPY W/ POLYPECTOMY  2004, 2009   Dr Watt Climes  . TUBAL LIGATION      reports that she has never smoked. She has never used smokeless tobacco. She reports current alcohol use of about 2.0 standard drinks of alcohol per week. She reports that she does not use drugs. family history includes Aneurysm in her brother; Asthma in an other family member; Breast cancer in her paternal grandmother; Coronary artery disease in her mother; Heart attack (age of onset: 16) in her father; Hyperlipidemia in an other family member; Hypertension in her mother; Sudden death in an other family member. No Known Allergies Current Outpatient Medications on File Prior to Visit  Medication Sig Dispense Refill  . acetaminophen (TYLENOL) 500 MG tablet Take 500 mg by mouth every 6 (six) hours as needed for mild pain or headache.    [name] amLODipine (NORVASC) 10 MG tablet Take 1 tablet (10 mg total) by mouth daily. 90 tablet 0  . aspirin 81 MG tablet Take 1 tablet (81 mg total) by mouth daily. 30 tablet 6  . benzonatate (TESSALON) 100 MG capsule Take 1 capsule (100 mg total) by mouth 2 (two) times daily as needed for cough. 20 capsule 0  . Coenzyme Q10 (CO Q 10) 100 MG CAPS Take 1 capsule by mouth every morning.    . diphenhydrAMINE (BENADRYL)  25 mg capsule Take 25 mg by mouth every 6 (six) hours as needed for allergies.    . famotidine (PEPCID AC) 10 MG tablet Take 1 tablet (10 mg total) by mouth 2 (two) times daily. 60 tablet 1  . labetalol (NORMODYNE) 300 MG tablet Take 1 tablet (300 mg total) by mouth daily. 90 tablet 1  . magnesium oxide (MAG-OX) 400 MG tablet 1 tablet by mouth at bedtime    . meloxicam (MOBIC) 7.5 MG tablet Take 1 tablet (7.5 mg total) by mouth daily. 90 tablet 1  . predniSONE (DELTASONE) 10 MG tablet  3 tabs by mouth per day for 3 days,2tabs per day for 3 days,1tab per day for 3 days 18 tablet 0  . rosuvastatin (CRESTOR) 10 MG tablet Take 1 tablet (10 mg total) by mouth daily. 90 tablet 0  . spironolactone (ALDACTONE) 25 MG tablet Take 1 tablet (25 mg total) by mouth 2 (two) times daily. 90 tablet 0  . traMADol (ULTRAM) 50 MG tablet Take 1 tablet (50 mg total) by mouth every 6 (six) hours as needed. 30 tablet 0  . triamcinolone (NASACORT) 55 MCG/ACT AERO nasal inhaler Place 2 sprays into the nose daily. 1 Inhaler 12  . vitamin C (ASCORBIC ACID) 500 MG tablet Take 500 mg by mouth daily.    [name] zolpidem (AMBIEN) 10 MG tablet Take 0.5 tablets (5 mg total) by mouth at bedtime as needed for sleep. 45 tablet 1   No current facility-administered medications on file prior to visit.     Observations/Objective: Alert, NAD, appropriate mood and affect, resps normal, cn 2-12 intact, moves all 4s, no visible rash or swelling Lab Results  Component Value Date   WBC 9.9 03/19/2019   HGB 12.2 03/19/2019   HCT 37.2 03/19/2019   PLT 322.0 03/19/2019   GLUCOSE 94 03/19/2019   CHOL 171 03/19/2019   TRIG 122.0 03/19/2019   HDL 65.50 03/19/2019   LDLCALC 81 03/19/2019   ALT 17 03/19/2019   AST 18 03/19/2019   NA 141 03/19/2019   K 3.5 03/19/2019   CL 103 03/19/2019   CREATININE 0.85 03/19/2019   BUN 17 03/19/2019   CO2 29 03/19/2019   TSH 2.80 03/19/2019   INR 1.1 (H) 01/25/2012   HGBA1C 6.5 03/19/2019   Assessment and Plan: See notes  Follow Up Instructions: See notes   I discussed the assessment and treatment plan with the patient. The patient was provided an opportunity to ask questions and all were answered. The patient agreed with the plan and demonstrated an understanding of the instructions.   The patient was advised to call back or seek an in-person evaluation if the symptoms worsen or if the condition fails to improve as anticipated.   Cathlean Cower, MD

## 2019-08-08 NOTE — Assessment & Plan Note (Signed)
Ok to change the tizanidine 4 to 2 mg prn,  to f/u any worsening symptoms or concerns

## 2019-08-13 ENCOUNTER — Other Ambulatory Visit: Payer: Self-pay

## 2019-08-13 ENCOUNTER — Ambulatory Visit (INDEPENDENT_AMBULATORY_CARE_PROVIDER_SITE_OTHER): Payer: Medicare Other

## 2019-08-13 ENCOUNTER — Other Ambulatory Visit (INDEPENDENT_AMBULATORY_CARE_PROVIDER_SITE_OTHER): Payer: Medicare Other

## 2019-08-13 ENCOUNTER — Ambulatory Visit
Admission: RE | Admit: 2019-08-13 | Discharge: 2019-08-13 | Disposition: A | Discharge status: Home / Self Care | Payer: Medicare Other | Source: Ambulatory Visit | Attending: Obstetrics and Gynecology | Admitting: Obstetrics and Gynecology

## 2019-08-13 DIAGNOSIS — R350 Frequency of micturition: Secondary | ICD-10-CM

## 2019-08-13 DIAGNOSIS — E538 Deficiency of other specified B group vitamins: Secondary | ICD-10-CM | POA: Diagnosis not present

## 2019-08-13 DIAGNOSIS — Z1231 Encounter for screening mammogram for malignant neoplasm of breast: Secondary | ICD-10-CM

## 2019-08-13 LAB — URINALYSIS, ROUTINE W REFLEX MICROSCOPIC
Bilirubin Urine: NEGATIVE
Hgb urine dipstick: NEGATIVE
Ketones, ur: NEGATIVE
Leukocytes,Ua: NEGATIVE
Nitrite: NEGATIVE
RBC / HPF: NONE SEEN (ref 0–?)
Specific Gravity, Urine: <=1.005 — AB (ref 1.000–1.030)
Total Protein, Urine: NEGATIVE
Urine Glucose: NEGATIVE
Urobilinogen, UA: 0.2 (ref 0.0–1.0)
WBC, UA: NONE SEEN (ref 0–?)
pH: 6 (ref 5.0–8.0)

## 2019-08-13 MED ORDER — CYANOCOBALAMIN 1000 MCG/ML IJ SOLN
1000.0000 ug | Freq: Once | INTRAMUSCULAR | Status: AC
  Administered 2019-08-13: 1000 ug via INTRAMUSCULAR

## 2019-08-13 NOTE — Progress Notes (Signed)
Medical screening examination/treatment/procedure(s) were performed by non-physician practitioner and as supervising physician I was immediately available for consultation/collaboration. I agree with above. James John, MD   

## 2019-08-14 ENCOUNTER — Other Ambulatory Visit: Payer: Self-pay | Admitting: Obstetrics and Gynecology

## 2019-08-14 DIAGNOSIS — N644 Mastodynia: Secondary | ICD-10-CM

## 2019-08-15 DIAGNOSIS — Z1159 Encounter for screening for other viral diseases: Secondary | ICD-10-CM | POA: Diagnosis not present

## 2019-08-15 LAB — URINE CULTURE
MICRO NUMBER:: 1054306
Result:: NO GROWTH
SPECIMEN QUALITY:: ADEQUATE

## 2019-08-20 DIAGNOSIS — Z1211 Encounter for screening for malignant neoplasm of colon: Secondary | ICD-10-CM | POA: Diagnosis not present

## 2019-08-23 ENCOUNTER — Other Ambulatory Visit: Payer: Self-pay | Admitting: Internal Medicine

## 2019-08-23 DIAGNOSIS — M546 Pain in thoracic spine: Secondary | ICD-10-CM

## 2019-08-23 DIAGNOSIS — G8929 Other chronic pain: Secondary | ICD-10-CM

## 2019-08-23 MED ORDER — MELOXICAM 7.5 MG PO TABS: 7.5000 mg | ORAL_TABLET | Freq: Every day | ORAL | 1 refills | Status: DC

## 2019-08-23 NOTE — Telephone Encounter (Signed)
Medication Refill - Medication:  meloxicam (MOBIC) 7.5 MG tablet  Has the patient contacted their pharmacy? Yes did send request for 90 day supply with 3 refills.   Preferred Pharmacy (with phone number or street name):  [facility], [place] [phone] (Phone) [phone] (Fax)   Agent: Please be advised that RX refills may take up to 3 business days. We ask that you follow-up with your pharmacy.

## 2019-08-30 ENCOUNTER — Other Ambulatory Visit: Payer: Medicare Other

## 2019-09-05 ENCOUNTER — Other Ambulatory Visit: Payer: Self-pay

## 2019-09-05 ENCOUNTER — Ambulatory Visit
Admission: RE | Admit: 2019-09-05 | Discharge: 2019-09-05 | Disposition: A | Discharge status: Home / Self Care | Payer: Medicare Other | Source: Ambulatory Visit | Attending: Obstetrics and Gynecology | Admitting: Obstetrics and Gynecology

## 2019-09-05 ENCOUNTER — Ambulatory Visit: Payer: Medicare Other

## 2019-09-05 DIAGNOSIS — N644 Mastodynia: Secondary | ICD-10-CM

## 2019-09-05 DIAGNOSIS — R928 Other abnormal and inconclusive findings on diagnostic imaging of breast: Secondary | ICD-10-CM | POA: Diagnosis not present

## 2019-09-11 ENCOUNTER — Other Ambulatory Visit: Payer: Self-pay

## 2019-09-11 ENCOUNTER — Encounter: Payer: Self-pay | Admitting: Family Medicine

## 2019-09-11 ENCOUNTER — Ambulatory Visit (INDEPENDENT_AMBULATORY_CARE_PROVIDER_SITE_OTHER): Payer: Medicare Other | Admitting: Family Medicine

## 2019-09-11 DIAGNOSIS — I6523 Occlusion and stenosis of bilateral carotid arteries: Secondary | ICD-10-CM | POA: Diagnosis not present

## 2019-09-11 DIAGNOSIS — G2589 Other specified extrapyramidal and movement disorders: Secondary | ICD-10-CM | POA: Insufficient documentation

## 2019-09-11 DIAGNOSIS — M999 Biomechanical lesion, unspecified: Secondary | ICD-10-CM | POA: Diagnosis not present

## 2019-09-11 MED ORDER — PREDNISONE 50 MG PO TABS: ORAL_TABLET | ORAL | 0 refills | Status: DC

## 2019-09-11 MED ORDER — GABAPENTIN 100 MG PO CAPS: 200.0000 mg | ORAL_CAPSULE | Freq: Every day | ORAL | 0 refills | Status: DC

## 2019-09-11 NOTE — Assessment & Plan Note (Signed)
Decision today to treat with OMT was based on Physical Exam  After verbal consent patient was treated with HVLA, ME, FPR techniques in cervical, thoracic, rib areas  Patient tolerated the procedure well with improvement in symptoms  Patient given exercises, stretches and lifestyle modifications  See medications in patient instructions if given  Patient will follow up in 4-8 weeks 

## 2019-09-11 NOTE — Patient Instructions (Addendum)
Prednisone 1 a day for next 5 days See me again in 5-6 weeks   [address] floor [place], [place] [address] Phone [phone]

## 2019-09-11 NOTE — Progress Notes (Signed)
[name] [place], [place] [address] Phone: [phone] Subjective:   [name], am serving as a scribe for Dr. Hulan Saas.  This visit occurred during the SARS-CoV-2 public health emergency.  Safety protocols were in place, including screening questions prior to the visit, additional usage of staff PPE, and extensive cleaning of exam room while observing appropriate contact time as indicated for disinfecting solutions.   I'm seeing this patient by the request  of:  Biagio Borg, MD   CC: Posterior neck pain  [id number]  Laura Poole is a 68 y.o. female coming in with complaint of neck pain for the past 5 months. Has tried new pillows and was going to get another mattress. Right sided pain that radiates into her neck and down into her arm. Has been getting headaches due to pain. Has been using [employer], IBU, Tylenol. Also takes zanaflex at night but that has not been helping.      Past Medical History:  Diagnosis Date  . Bilateral leg cramps 09/01/2015  . Carotid stenosis 06/23/2014  . Chest pain   . Essential hypertension 01/07/2010   Qualifier: Diagnosis of  By: Linna Darner MD, Gwyndolyn Saxon    . GERD (gastroesophageal reflux disease)   . H/O: hysterectomy   . Heart murmur 03/30/2017  . History of breast cancer 2006  . History of colonic polyps   . HLD (hyperlipidemia)   . HTN (hypertension)   . Mitral regurgitation 08/05/2015  . Personal history of chemotherapy 2006   Left Breast Cancer  . Personal history of radiation therapy 2006   Left Breast Cancer  . Snoring 03/30/2017   Past Surgical History:  Procedure Laterality Date  . ABDOMINAL HYSTERECTOMY    . BREAST LUMPECTOMY Left 2006  . COLONOSCOPY W/ POLYPECTOMY  2004, 2009   Dr Watt Climes  . TUBAL LIGATION     Social History   Socioeconomic History  . Marital status: Single    Spouse name: Not on file  . Number of children: Not on file  . Years of education: Not on  file  . Highest education level: Not on file  Occupational History  . Not on file  Social Needs  . Financial resource strain: Not on file  . Food insecurity    Worry: Not on file    Inability: Not on file  . Transportation needs    Medical: Not on file    Non-medical: Not on file  Tobacco Use  . Smoking status: Never Smoker  . Smokeless tobacco: Never Used  Substance and Sexual Activity  . Alcohol use: Yes    Alcohol/week: 2.0 standard drinks    Types: 2 Cans of beer per week    Comment: rarely  . Drug use: No  . Sexual activity: Not on file  Lifestyle  . Physical activity    Days per week: Not on file    Minutes per session: Not on file  . Stress: Not on file  Relationships  . Social Herbalist on phone: Not on file    Gets together: Not on file    Attends religious service: Not on file    Active member of club or organization: Not on file    Attends meetings of clubs or organizations: Not on file    Relationship status: Not on file  Other Topics Concern  . Not on file  Social History Narrative  . Not on file  No Known Allergies Family History  Problem Relation Age of Onset  . Heart attack Father 29  . Hypertension Mother   . Coronary artery disease Mother   . Aneurysm Brother   . Asthma Other        nephew  . Hyperlipidemia Other        nephew  . Sudden death Other        nephew  . Breast cancer Paternal Grandmother   . Colon cancer Neg Hx     Current Outpatient Medications (Endocrine & Metabolic):  .  predniSONE (DELTASONE) 10 MG tablet, 3 tabs by mouth per day for 3 days,2tabs per day for 3 days,1tab per day for 3 days .  predniSONE (DELTASONE) 50 MG tablet, Take one tablet daily for the next 5 days.  Current Outpatient Medications (Cardiovascular):  .  amLODipine (NORVASC) 10 MG tablet, Take 1 tablet (10 mg total) by mouth daily. [name]  labetalol (NORMODYNE) 300 MG tablet, Take 1 tablet (300 mg total) by mouth daily. .  rosuvastatin  (CRESTOR) 10 MG tablet, Take 1 tablet (10 mg total) by mouth daily. [name]  spironolactone (ALDACTONE) 25 MG tablet, Take 1 tablet (25 mg total) by mouth 2 (two) times daily.  Current Outpatient Medications (Respiratory):  .  benzonatate (TESSALON) 100 MG capsule, Take 1 capsule (100 mg total) by mouth 2 (two) times daily as needed for cough. .  diphenhydrAMINE (BENADRYL) 25 mg capsule, Take 25 mg by mouth every 6 (six) hours as needed for allergies. .  fexofenadine (ALLEGRA) 180 MG tablet, Take 1 tablet (180 mg total) by mouth daily. [name]  triamcinolone (NASACORT) 55 MCG/ACT AERO nasal inhaler, Place 2 sprays into the nose daily.  Current Outpatient Medications (Analgesics):  .  acetaminophen (TYLENOL) 500 MG tablet, Take 500 mg by mouth every 6 (six) hours as needed for mild pain or headache. [name]  aspirin 81 MG tablet, Take 1 tablet (81 mg total) by mouth daily. .  meloxicam (MOBIC) 7.5 MG tablet, Take 1 tablet (7.5 mg total) by mouth daily. .  traMADol (ULTRAM) 50 MG tablet, Take 1 tablet (50 mg total) by mouth every 6 (six) hours as needed.   Current Outpatient Medications (Other):  [name]  Coenzyme Q10 (CO Q 10) 100 MG CAPS, Take 1 capsule by mouth every morning. .  famotidine (PEPCID AC) 10 MG tablet, Take 1 tablet (10 mg total) by mouth 2 (two) times daily. .  magnesium oxide (MAG-OX) 400 MG tablet, 1 tablet by mouth at bedtime .  tiZANidine (ZANAFLEX) 2 MG tablet, Take 1 tablet (2 mg total) by mouth every 6 (six) hours as needed for muscle spasms. .  vitamin C (ASCORBIC ACID) 500 MG tablet, Take 500 mg by mouth daily. [name]  zolpidem (AMBIEN) 10 MG tablet, Take 0.5 tablets (5 mg total) by mouth at bedtime as needed for sleep. [name]  gabapentin (NEURONTIN) 100 MG capsule, Take 2 capsules (200 mg total) by mouth at bedtime.    Past medical history, social, surgical and family history all reviewed in electronic medical record.  No pertanent information unless stated regarding to the chief complaint.    Review of Systems:  No headache, visual changes, nausea, vomiting, diarrhea, constipation, dizziness, abdominal pain, skin rash, fevers, chills, night sweats, weight loss, swollen lymph nodes, body aches, joint swelling,  chest pain, shortness of breath, mood changes.  Positive muscle aches  Objective  Blood pressure 128/86, pulse 81, height 5' 7.5" (1.715 m), weight 181 lb (82.1 kg), SpO2  98 %.    General: No apparent distress alert and oriented x3 mood and affect normal, dressed appropriately.  HEENT: Pupils equal, extraocular movements intact  Respiratory: Patient's speak in full sentences and does not appear short of breath  Cardiovascular: No lower extremity edema, non tender, no erythema  Skin: Warm dry intact with no signs of infection or rash on extremities or on axial skeleton.  Abdomen: Soft nontender  Neuro: Cranial nerves II through XII are intact, neurovascularly intact in all extremities with 2+ DTRs and 2+ pulses.  Lymph: No lymphadenopathy of posterior or anterior cervical chain or axillae bilaterally.  Gait normal with good balance and coordination.  MSK:  Non tender with full range of motion and good stability and symmetric strength and tone of shoulders, elbows, wrist, hip, knee and ankles bilaterally.  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  Bilateral thoracic back pain over noted significant kyphosis noted.  No midline tenderness.  Large patient's neck does have some anterior translation with some loss of lordosis Mild positive Spurling's maneuver.  Osteopathic findings  C6 flexed rotated and side bent left T3 extended rotated and side bent right inhaled third rib T9 extended rotated and side bent left    Impression and Recommendations:     This case required medical decision making of moderate complexity. The above documentation has been reviewed and is accurate and complete Lyndal Pulley, DO        Note: This dictation was prepared with Dragon dictation along with smaller phrase technology. Any transcriptional errors that result from this process are unintentional.

## 2019-09-11 NOTE — Assessment & Plan Note (Signed)
Patient does have scapular dyskinesis, severe tightness noted.  Possible some mild cervical radiculopathy noted.  X-rays only showed mild arthritic changes.  Gabapentin given, icing regimen, which activities to do which wants to avoid.  Patient will follow-up again in 4 weeks.  Attempted osteopathic manipulation with very minimal benefit today.

## 2019-09-12 ENCOUNTER — Telehealth: Payer: Self-pay | Admitting: Emergency Medicine

## 2019-09-12 ENCOUNTER — Ambulatory Visit (INDEPENDENT_AMBULATORY_CARE_PROVIDER_SITE_OTHER): Payer: Medicare Other | Admitting: *Deleted

## 2019-09-12 DIAGNOSIS — E538 Deficiency of other specified B group vitamins: Secondary | ICD-10-CM | POA: Diagnosis not present

## 2019-09-12 DIAGNOSIS — I10 Essential (primary) hypertension: Secondary | ICD-10-CM

## 2019-09-12 DIAGNOSIS — E785 Hyperlipidemia, unspecified: Secondary | ICD-10-CM

## 2019-09-12 MED ORDER — SPIRONOLACTONE 25 MG PO TABS: 25.0000 mg | ORAL_TABLET | Freq: Two times a day (BID) | ORAL | 1 refills | Status: DC

## 2019-09-12 MED ORDER — LABETALOL HCL 300 MG PO TABS: 300.0000 mg | ORAL_TABLET | Freq: Every day | ORAL | 1 refills | Status: DC

## 2019-09-12 MED ORDER — CYANOCOBALAMIN 1000 MCG/ML IJ SOLN
1000.0000 ug | Freq: Once | INTRAMUSCULAR | Status: AC
  Administered 2019-09-12: 1000 ug via INTRAMUSCULAR

## 2019-09-12 MED ORDER — ROSUVASTATIN CALCIUM 10 MG PO TABS: 10.0000 mg | ORAL_TABLET | Freq: Every day | ORAL | 1 refills | Status: DC

## 2019-09-12 NOTE — Progress Notes (Signed)
Medical screening examination/treatment/procedure(s) were performed by non-physician practitioner and as supervising physician I was immediately available for consultation/collaboration. I agree with above. James John, MD   

## 2019-09-12 NOTE — Telephone Encounter (Signed)
Pt is requesting a refill on her spironolactone (ALDACTONE) 25 MG tablet, labetalol (NORMODYNE) 300 MG tablet and rosuvastatin (CRESTOR) 10 MG tablet. Pharmacy is [facility]. Thanks

## 2019-09-21 ENCOUNTER — Other Ambulatory Visit (INDEPENDENT_AMBULATORY_CARE_PROVIDER_SITE_OTHER): Payer: Medicare Other

## 2019-09-21 ENCOUNTER — Other Ambulatory Visit: Payer: Self-pay

## 2019-09-21 ENCOUNTER — Encounter: Payer: Self-pay | Admitting: Internal Medicine

## 2019-09-21 ENCOUNTER — Telehealth: Payer: Self-pay | Admitting: Internal Medicine

## 2019-09-21 ENCOUNTER — Ambulatory Visit (INDEPENDENT_AMBULATORY_CARE_PROVIDER_SITE_OTHER): Payer: Medicare Other | Admitting: Internal Medicine

## 2019-09-21 VITALS — BP 114/76 | HR 81 | Temp 97.9°F | Ht 67.5 in | Wt 181.0 lb

## 2019-09-21 DIAGNOSIS — M542 Cervicalgia: Secondary | ICD-10-CM | POA: Diagnosis not present

## 2019-09-21 DIAGNOSIS — F329 Major depressive disorder, single episode, unspecified: Secondary | ICD-10-CM | POA: Diagnosis not present

## 2019-09-21 DIAGNOSIS — G8929 Other chronic pain: Secondary | ICD-10-CM | POA: Diagnosis not present

## 2019-09-21 DIAGNOSIS — I6523 Occlusion and stenosis of bilateral carotid arteries: Secondary | ICD-10-CM

## 2019-09-21 DIAGNOSIS — R399 Unspecified symptoms and signs involving the genitourinary system: Secondary | ICD-10-CM

## 2019-09-21 DIAGNOSIS — M549 Dorsalgia, unspecified: Secondary | ICD-10-CM | POA: Diagnosis not present

## 2019-09-21 DIAGNOSIS — F419 Anxiety disorder, unspecified: Secondary | ICD-10-CM | POA: Insufficient documentation

## 2019-09-21 DIAGNOSIS — M545 Low back pain: Secondary | ICD-10-CM

## 2019-09-21 DIAGNOSIS — F32A Depression, unspecified: Secondary | ICD-10-CM | POA: Insufficient documentation

## 2019-09-21 LAB — URINALYSIS, ROUTINE W REFLEX MICROSCOPIC
Bilirubin Urine: NEGATIVE
Hgb urine dipstick: NEGATIVE
Ketones, ur: NEGATIVE
Leukocytes,Ua: NEGATIVE
Nitrite: NEGATIVE
Specific Gravity, Urine: 1.02 (ref 1.000–1.030)
Total Protein, Urine: NEGATIVE
Urine Glucose: NEGATIVE
Urobilinogen, UA: 0.2 (ref 0.0–1.0)
pH: 7.5 (ref 5.0–8.0)

## 2019-09-21 MED ORDER — DULOXETINE HCL 60 MG PO CPEP: 60.0000 mg | ORAL_CAPSULE | Freq: Every day | ORAL | 3 refills | Status: DC

## 2019-09-21 MED ORDER — TRAMADOL HCL 50 MG PO TABS: 50.0000 mg | ORAL_TABLET | Freq: Four times a day (QID) | ORAL | 2 refills | Status: DC | PRN

## 2019-09-21 NOTE — Patient Instructions (Addendum)
Please take all new medication as prescribed - the tramadof for pain, and the cymbalta for pain and nerves  Please continue all other medications as before, and refills have been done if requested.  Please have the pharmacy call with any other refills you may need.  Please continue your efforts at being more active, low cholesterol diet, and weight control.  Please keep your appointments with your specialists as you may have planned  Please go to the LAB in the Basement (turn left off the elevator) for the tests to be done today - just the urine testing  You will be contacted by phone if any changes need to be made immediately.  Otherwise, you will receive a letter about your results with an explanation, but please check with MyChart first.  Please remember to sign up for MyChart if you have not done so, as this will be important to you in the future with finding out test results, communicating by private email, and scheduling acute appointments online when needed.

## 2019-09-21 NOTE — Progress Notes (Signed)
Subjective:    Patient ID: Laura Poole, female    DOB: [birth year], 68 y.o.   MRN: [id number]  HPI  Here with right  neck pain x 4-5 mo, severe, constant, sharp, also later had right shoulder pain not sure if connected but o/w not radicular.  Did have some prednisone per Dr Tamala Julian with gabapentin, made her drowsy, seemed to help the pain at night, made her mellow and sleeps ok, but right back the neck in the am.  Really does not want to keep taking the gabapentin. Also with right lower back pain x 2-3 wks, severe, constant, sharp pressure like, not really better with heating pads, otc back brace, or massage; sitting and standing up with no worsening pain but pain worse with walking, no falls.  Pt denies bowel or bladder change, fever, wt loss,  worsening LE pain/numbness/weakness, Also with pain to left groin area x 1 wk, moderate, intermittent, only worse to walk, crmpy like, no radiation, wondering if related to chronic lower back pain she has anyway;  No falls. Has an appt with orthopedic next Monday for "everything." at the insistence of daughter.  Has severeal relatives with drug abuse hx and she is the only one in the family without cocaine or heroin use.  Also, has ongoing urinary frequency, but Denies urinary symptoms such as dysuria, urgency, flank pain, hematuria or n/v, fever, chills.   Past Medical History:  Diagnosis Date  . Bilateral leg cramps 09/01/2015  . Carotid stenosis 06/23/2014  . Chest pain   . Essential hypertension 01/07/2010   Qualifier: Diagnosis of  By: Linna Darner MD, Gwyndolyn Saxon    . GERD (gastroesophageal reflux disease)   . H/O: hysterectomy   . Heart murmur 03/30/2017  . History of breast cancer 2006  . History of colonic polyps   . HLD (hyperlipidemia)   . HTN (hypertension)   . Mitral regurgitation 08/05/2015  . Personal history of chemotherapy 2006   Left Breast Cancer  . Personal history of radiation therapy 2006   Left Breast Cancer  . Snoring 03/30/2017    Past Surgical History:  Procedure Laterality Date  . ABDOMINAL HYSTERECTOMY    . BREAST LUMPECTOMY Left 2006  . COLONOSCOPY W/ POLYPECTOMY  2004, 2009   Dr Watt Climes  . TUBAL LIGATION      reports that she has never smoked. She has never used smokeless tobacco. She reports current alcohol use of about 2.0 standard drinks of alcohol per week. She reports that she does not use drugs. family history includes Aneurysm in her brother; Asthma in an other family member; Breast cancer in her paternal grandmother; Coronary artery disease in her mother; Heart attack (age of onset: 45) in her father; Hyperlipidemia in an other family member; Hypertension in her mother; Sudden death in an other family member. No Known Allergies Current Outpatient Medications on File Prior to Visit  Medication Sig Dispense Refill  . acetaminophen (TYLENOL) 500 MG tablet Take 500 mg by mouth every 6 (six) hours as needed for mild pain or headache.    [name] amLODipine (NORVASC) 10 MG tablet Take 1 tablet (10 mg total) by mouth daily. 90 tablet 0  . aspirin 81 MG tablet Take 1 tablet (81 mg total) by mouth daily. 30 tablet 6  . benzonatate (TESSALON) 100 MG capsule Take 1 capsule (100 mg total) by mouth 2 (two) times daily as needed for cough. 20 capsule 0  . Coenzyme Q10 (CO Q 10) 100 MG CAPS Take 1 capsule  by mouth every morning.    . diphenhydrAMINE (BENADRYL) 25 mg capsule Take 25 mg by mouth every 6 (six) hours as needed for allergies.    . famotidine (PEPCID AC) 10 MG tablet Take 1 tablet (10 mg total) by mouth 2 (two) times daily. 60 tablet 1  . fexofenadine (ALLEGRA) 180 MG tablet Take 1 tablet (180 mg total) by mouth daily. 90 tablet 3  . gabapentin (NEURONTIN) 100 MG capsule Take 2 capsules (200 mg total) by mouth at bedtime. 180 capsule 0  . labetalol (NORMODYNE) 300 MG tablet Take 1 tablet (300 mg total) by mouth daily. 90 tablet 1  . magnesium oxide (MAG-OX) 400 MG tablet 1 tablet by mouth at bedtime    . meloxicam  (MOBIC) 7.5 MG tablet Take 1 tablet (7.5 mg total) by mouth daily. 90 tablet 1  . predniSONE (DELTASONE) 10 MG tablet 3 tabs by mouth per day for 3 days,2tabs per day for 3 days,1tab per day for 3 days 18 tablet 0  . predniSONE (DELTASONE) 50 MG tablet Take one tablet daily for the next 5 days. 5 tablet 0  . rosuvastatin (CRESTOR) 10 MG tablet Take 1 tablet (10 mg total) by mouth daily. 90 tablet 1  . spironolactone (ALDACTONE) 25 MG tablet Take 1 tablet (25 mg total) by mouth 2 (two) times daily. 90 tablet 1  . tiZANidine (ZANAFLEX) 2 MG tablet Take 1 tablet (2 mg total) by mouth every 6 (six) hours as needed for muscle spasms. 360 tablet 1  . traMADol (ULTRAM) 50 MG tablet Take 1 tablet (50 mg total) by mouth every 6 (six) hours as needed. 30 tablet 0  . triamcinolone (NASACORT) 55 MCG/ACT AERO nasal inhaler Place 2 sprays into the nose daily. 1 Inhaler 12  . vitamin C (ASCORBIC ACID) 500 MG tablet Take 500 mg by mouth daily.    [name] zolpidem (AMBIEN) 10 MG tablet Take 0.5 tablets (5 mg total) by mouth at bedtime as needed for sleep. 45 tablet 1   No current facility-administered medications on file prior to visit.   Review of Systems  Constitutional: Negative for other unusual diaphoresis or sweats HENT: Negative for ear discharge or swelling Eyes: Negative for other worsening visual disturbances Respiratory: Negative for stridor or other swelling  Gastrointestinal: Negative for worsening distension or other blood Genitourinary: Negative for retention or other urinary change Musculoskeletal: Negative for other MSK pain or swelling Skin: Negative for color change or other new lesions Neurological: Negative for worsening tremors and other numbness  Psychiatric/Behavioral: Negative for worsening agitation or other fatigue All otherwise neg per pt     Objective:   Physical Exam BP 114/76   Pulse 81   Temp 97.9 F (36.6 C) (Oral)   Ht 5' 7.5" (1.715 m)   Wt 181 lb (82.1 kg)   SpO2 99%    BMI 27.93 kg/m  VS noted,  Constitutional: Pt appears in NAD HENT: Head: NCAT.  Right Ear: External ear normal.  Left Ear: External ear normal.  Eyes: . Pupils are equal, round, and reactive to light. Conjunctivae and EOM are normal Nose: without d/c or deformity Neck: Neck supple. Gross normal ROM Cardiovascular: Normal rate and regular rhythm.   Pulmonary/Chest: Effort normal and breath sounds without rales or wheezing.  Abd:  Soft, NT, ND, + BS, no organomegaly Right upper back tense and right lumbar tense spasm noted Neurological: Pt is alert. At baseline orientation, motor grossly intact Skin: Skin is warm. No rashes, other  new lesions, no LE edema Psychiatric: Pt behavior is normal without agitation  All otherwise neg per pt  Lab Results  Component Value Date   WBC 9.9 03/19/2019   HGB 12.2 03/19/2019   HCT 37.2 03/19/2019   PLT 322.0 03/19/2019   GLUCOSE 94 03/19/2019   CHOL 171 03/19/2019   TRIG 122.0 03/19/2019   HDL 65.50 03/19/2019   LDLCALC 81 03/19/2019   ALT 17 03/19/2019   AST 18 03/19/2019   NA 141 03/19/2019   K 3.5 03/19/2019   CL 103 03/19/2019   CREATININE 0.85 03/19/2019   BUN 17 03/19/2019   CO2 29 03/19/2019   TSH 2.80 03/19/2019   INR 1.1 (H) 01/25/2012   HGBA1C 6.5 03/19/2019    DG Cervical Spine Complete (Accession [id number]) (Order [id number]) Imaging Date: 03/19/2019 Department: [facility] Released By: Yvette Rack, RT Authorizing: Biagio Borg, MD  Exam Status  Status  Final [[id number]]  PACS Intelerad Image Link  Show images for DG Cervical Spine Complete  Study Result  CLINICAL DATA:  Neck pain for 2 weeks, no known injury, initial encounter  EXAM: CERVICAL SPINE - COMPLETE 4+ VIEW  COMPARISON:  None.  FINDINGS: Seven cervical segments are well visualized. Vertebral body height is well maintained. No significant neural foraminal narrowing is noted. Facet hypertrophic changes seen. The  odontoid is within normal limits.  IMPRESSION: Mild degenerative change without acute abnormality.   Electronically Signed   By: Inez Catalina M.D.   On: 03/19/2019 09:03   DG Lumbar Spine Complete (Accession [id number]) (Order [id number]) Imaging Date: 10/30/2014 Department: [facility] Released By: Shelda Jakes Devang Authorizing: Lyndal Pulley, DO  Exam Status  Status  Final [99]  PACS Intelerad Image Link  Show images for DG Lumbar Spine Complete  Study Result  CLINICAL DATA:  Chronic back pain.  EXAM: LUMBAR SPINE - COMPLETE 4+ VIEW  COMPARISON:  None.  FINDINGS: Diffuse degenerative changes noted lumbar spine with disc space loss and degenerative endplate osteophyte formation at L5-S1. No acute bony abnormality identified. Normal alignment mineralization.  IMPRESSION: No acute abnormality. Degenerative changes lumbar spine with disc space loss and degenerative endplate osteophyte for may at L5-S1.   Electronically Signed   By: Marcello Moores  Register   On: 10/30/2014 09:50       Assessment & Plan:

## 2019-09-21 NOTE — Telephone Encounter (Signed)
I can only send the cymbalta as the tramadol is no appropriate [facility] as it is a controlled substance

## 2019-09-21 NOTE — Telephone Encounter (Signed)
Patient called in stating she would like DULoxetine (CYMBALTA) 60 MG capsule , traMADol (ULTRAM) 50 MG tablet to be sent to  [place], [place] Phone:  [phone]  Fax:  [phone]    Please advise.

## 2019-09-22 LAB — URINE CULTURE
MICRO NUMBER:: 1189253
SPECIMEN QUALITY:: ADEQUATE

## 2019-09-23 ENCOUNTER — Encounter: Payer: Self-pay | Admitting: Internal Medicine

## 2019-09-23 NOTE — Assessment & Plan Note (Signed)
For cymbalta asd,  to f/u any worsening symptoms or concerns  

## 2019-09-23 NOTE — Assessment & Plan Note (Addendum)
C/w underlying lumbar dz, for urine studies but less likely uti related, for tramadol prn pain  Note:  Total time for pt hx, exam, review of record with pt in the room, determination of diagnoses and plan for further eval and tx is > 40 min, with over 50% spent in coordination and counseling of patient including the differential dx, tx, further evaluation and other management of low back pain, right neck pain, right upper back pain, and anxiety/depression

## 2019-09-23 NOTE — Assessment & Plan Note (Signed)
C/w msk, for tramadol prn

## 2019-09-23 NOTE — Assessment & Plan Note (Signed)
C/w msk pain, for tramadol prn,  to f/u any worsening symptoms or concerns

## 2019-09-27 ENCOUNTER — Telehealth: Payer: Self-pay

## 2019-09-27 MED ORDER — GABAPENTIN 100 MG PO CAPS: 200.0000 mg | ORAL_CAPSULE | Freq: Every day | ORAL | 1 refills | Status: DC

## 2019-09-27 NOTE — Telephone Encounter (Signed)
Copied from [place] [id number]. Topic: General - Other >> Sep 27, 2019 11:14 AM Yvette Rack wrote: Reason for CRM: Sharyn Lull with [facility] stated pt would like the Rx for gabapentin to be sent to [place], [place] Phone: [phone]  Fax: [phone]

## 2019-10-01 ENCOUNTER — Other Ambulatory Visit: Payer: Medicare Other

## 2019-10-12 ENCOUNTER — Other Ambulatory Visit: Payer: Self-pay | Admitting: Internal Medicine

## 2019-10-12 DIAGNOSIS — G4700 Insomnia, unspecified: Secondary | ICD-10-CM

## 2019-10-12 NOTE — Telephone Encounter (Signed)
Done erx 

## 2019-10-15 ENCOUNTER — Telehealth: Payer: Self-pay | Admitting: Emergency Medicine

## 2019-10-15 ENCOUNTER — Other Ambulatory Visit: Payer: Self-pay

## 2019-10-15 ENCOUNTER — Ambulatory Visit (INDEPENDENT_AMBULATORY_CARE_PROVIDER_SITE_OTHER): Payer: Medicare Other

## 2019-10-15 DIAGNOSIS — E538 Deficiency of other specified B group vitamins: Secondary | ICD-10-CM | POA: Diagnosis not present

## 2019-10-15 MED ORDER — CYANOCOBALAMIN 1000 MCG/ML IJ SOLN
1000.0000 ug | Freq: Once | INTRAMUSCULAR | Status: AC
  Administered 2019-10-15: 1000 ug via INTRAMUSCULAR

## 2019-10-15 NOTE — Telephone Encounter (Signed)
Pt is requesting a refill on her Ambien. Pharmacy is [facility]. Pt is also wanting to get the ok to get a Covid test done through her job. No symptoms just wanting to have one done. I informed patient that she doesn't need a Doctors order to have the test done but she wants Dr Jenny Reichmann to ok it due to her health history. She stated a mychart response would be ok. Thanks.

## 2019-10-15 NOTE — Progress Notes (Signed)
Medical screening examination/treatment/procedure(s) were performed by non-physician practitioner and as supervising physician I was immediately available for consultation/collaboration. I agree with above. Jahzaria Vary, MD   

## 2019-10-15 NOTE — Telephone Encounter (Signed)
Last OV 09/21/19.Marland Kitchenplease advise

## 2019-10-15 NOTE — Telephone Encounter (Signed)
A COVID test is only needed if symptomatic or if the patient can say they may have been exposed  Routine screening for asymptomatic or non exposed patients is not needed  ambien already done Oct 12 2019

## 2019-10-16 NOTE — Telephone Encounter (Signed)
Sent  msg vis mychart.Marland KitchenJohny Poole

## 2019-10-18 ENCOUNTER — Ambulatory Visit: Payer: Medicare Other | Admitting: Family Medicine

## 2019-10-18 ENCOUNTER — Ambulatory Visit: Payer: Medicare Other | Attending: Internal Medicine

## 2019-10-18 DIAGNOSIS — Z20822 Contact with and (suspected) exposure to covid-19: Secondary | ICD-10-CM

## 2019-10-20 LAB — NOVEL CORONAVIRUS, NAA: SARS-CoV-2, NAA: NOT DETECTED

## 2019-10-25 ENCOUNTER — Other Ambulatory Visit: Payer: Medicare Other

## 2019-10-25 ENCOUNTER — Encounter: Payer: Self-pay | Admitting: Internal Medicine

## 2019-10-26 DIAGNOSIS — Z23 Encounter for immunization: Secondary | ICD-10-CM | POA: Diagnosis not present

## 2019-11-05 ENCOUNTER — Other Ambulatory Visit: Payer: Self-pay | Admitting: Internal Medicine

## 2019-11-05 DIAGNOSIS — G8929 Other chronic pain: Secondary | ICD-10-CM

## 2019-11-15 ENCOUNTER — Ambulatory Visit (INDEPENDENT_AMBULATORY_CARE_PROVIDER_SITE_OTHER): Payer: Medicare Other | Admitting: *Deleted

## 2019-11-15 ENCOUNTER — Other Ambulatory Visit: Payer: Self-pay

## 2019-11-15 DIAGNOSIS — E538 Deficiency of other specified B group vitamins: Secondary | ICD-10-CM | POA: Diagnosis not present

## 2019-11-15 MED ORDER — CYANOCOBALAMIN 1000 MCG/ML IJ SOLN
1000.0000 ug | Freq: Once | INTRAMUSCULAR | Status: AC
  Administered 2019-11-15: 1000 ug via INTRAMUSCULAR

## 2019-11-15 NOTE — Progress Notes (Signed)
Medical screening examination/treatment/procedure(s) were performed by non-physician practitioner and as supervising physician I was immediately available for consultation/collaboration. I agree with above. James John, MD   

## 2019-11-23 DIAGNOSIS — Z23 Encounter for immunization: Secondary | ICD-10-CM | POA: Diagnosis not present

## 2019-12-05 ENCOUNTER — Telehealth: Payer: Self-pay | Admitting: Internal Medicine

## 2019-12-05 NOTE — Telephone Encounter (Signed)
Please advise 

## 2019-12-05 NOTE — Telephone Encounter (Signed)
New Message:    Pt is calling and states she received her 2nd vaccine on 02/11 and states she is still having swelling in her breast and it is very noticeable. She states she called the breast center since she has a history of breast cancer and they advised she call her primary doc. Please advise.

## 2019-12-05 NOTE — Telephone Encounter (Signed)
I think likely needs OV as this would be unlikely to be related to the vaccine shot

## 2019-12-06 NOTE — Telephone Encounter (Signed)
Appointment made for tomorrow

## 2019-12-07 ENCOUNTER — Other Ambulatory Visit: Payer: Self-pay

## 2019-12-07 ENCOUNTER — Ambulatory Visit (INDEPENDENT_AMBULATORY_CARE_PROVIDER_SITE_OTHER): Payer: Medicare Other | Admitting: Internal Medicine

## 2019-12-07 ENCOUNTER — Encounter: Payer: Self-pay | Admitting: Internal Medicine

## 2019-12-07 VITALS — BP 138/86 | HR 81 | Temp 99.0°F | Ht 67.5 in | Wt 180.5 lb

## 2019-12-07 DIAGNOSIS — I10 Essential (primary) hypertension: Secondary | ICD-10-CM | POA: Diagnosis not present

## 2019-12-07 DIAGNOSIS — F419 Anxiety disorder, unspecified: Secondary | ICD-10-CM | POA: Diagnosis not present

## 2019-12-07 DIAGNOSIS — N63 Unspecified lump in unspecified breast: Secondary | ICD-10-CM | POA: Diagnosis not present

## 2019-12-07 DIAGNOSIS — F329 Major depressive disorder, single episode, unspecified: Secondary | ICD-10-CM

## 2019-12-07 DIAGNOSIS — F32A Depression, unspecified: Secondary | ICD-10-CM

## 2019-12-07 DIAGNOSIS — N644 Mastodynia: Secondary | ICD-10-CM

## 2019-12-07 MED ORDER — AMOXICILLIN-POT CLAVULANATE 875-125 MG PO TABS: 1.0000 | ORAL_TABLET | Freq: Two times a day (BID) | ORAL | 0 refills | Status: DC

## 2019-12-07 NOTE — Progress Notes (Signed)
Subjective:    Patient ID: Laura Poole, female    DOB: 19-Feb-1951, 69 y.o.   MRN: [id number]  HPI  Here now s/o covid shot x 2; since the first shot about 4-5 wks ago has had unusual right breast enlargement and soreness just not improving, without fever, trauma , rash, skin change, nipple pain or discharge.  Overall moderate, constant, without mass she is aware to breast or axilla.  Injection sites soreness resolved. Pt denies chest pain, increased sob or doe, wheezing, orthopnea, PND, increased LE swelling, palpitations, dizziness or syncope.   Pt denies polydipsia, polyuria   Last mammogram oct 2020 neg. Denies worsening depressive symptoms, suicidal ideation, or panic; Pt denies chest pain, increased sob or doe, wheezing, orthopnea, PND, increased LE swelling, palpitations, dizziness or syncope. Past Medical History:  Diagnosis Date  . Bilateral leg cramps 09/01/2015  . Carotid stenosis 06/23/2014  . Chest pain   . Essential hypertension 01/07/2010   Qualifier: Diagnosis of  By: Linna Darner MD, Gwyndolyn Saxon    . GERD (gastroesophageal reflux disease)   . H/O: hysterectomy   . Heart murmur 03/30/2017  . History of breast cancer 2006  . History of colonic polyps   . HLD (hyperlipidemia)   . HTN (hypertension)   . Mitral regurgitation 08/05/2015  . Personal history of chemotherapy 2006   Left Breast Cancer  . Personal history of radiation therapy 2006   Left Breast Cancer  . Snoring 03/30/2017   Past Surgical History:  Procedure Laterality Date  . ABDOMINAL HYSTERECTOMY    . BREAST LUMPECTOMY Left 2006  . COLONOSCOPY W/ POLYPECTOMY  2004, 2009   Dr Watt Climes  . TUBAL LIGATION      reports that she has never smoked. She has never used smokeless tobacco. She reports current alcohol use of about 2.0 standard drinks of alcohol per week. She reports that she does not use drugs. family history includes Aneurysm in her brother; Asthma in an other family member; Breast cancer in her paternal  grandmother; Coronary artery disease in her mother; Heart attack (age of onset: 29) in her father; Hyperlipidemia in an other family member; Hypertension in her mother; Sudden death in an other family member. No Known Allergies Current Outpatient Medications on File Prior to Visit  Medication Sig Dispense Refill  . acetaminophen (TYLENOL) 500 MG tablet Take 500 mg by mouth every 6 (six) hours as needed for mild pain or headache.    [name] amLODipine (NORVASC) 10 MG tablet Take 1 tablet (10 mg total) by mouth daily. 90 tablet 0  . aspirin 81 MG tablet Take 1 tablet (81 mg total) by mouth daily. 30 tablet 6  . benzonatate (TESSALON) 100 MG capsule Take 1 capsule (100 mg total) by mouth 2 (two) times daily as needed for cough. 20 capsule 0  . Coenzyme Q10 (CO Q 10) 100 MG CAPS Take 1 capsule by mouth every morning.    . diphenhydrAMINE (BENADRYL) 25 mg capsule Take 25 mg by mouth every 6 (six) hours as needed for allergies.    . DULoxetine (CYMBALTA) 60 MG capsule Take 1 capsule (60 mg total) by mouth daily. 90 capsule 3  . famotidine (PEPCID AC) 10 MG tablet Take 1 tablet (10 mg total) by mouth 2 (two) times daily. 60 tablet 1  . fexofenadine (ALLEGRA) 180 MG tablet Take 1 tablet (180 mg total) by mouth daily. 90 tablet 3  . gabapentin (NEURONTIN) 100 MG capsule Take 2 capsules (200 mg total) by mouth at  bedtime. 180 capsule 1  . labetalol (NORMODYNE) 300 MG tablet Take 1 tablet (300 mg total) by mouth daily. 90 tablet 1  . magnesium oxide (MAG-OX) 400 MG tablet 1 tablet by mouth at bedtime    . meloxicam (MOBIC) 7.5 MG tablet TAKE 1 TABLET BY MOUTH EVERY DAY 90 tablet 1  . predniSONE (DELTASONE) 10 MG tablet 3 tabs by mouth per day for 3 days,2tabs per day for 3 days,1tab per day for 3 days 18 tablet 0  . predniSONE (DELTASONE) 50 MG tablet Take one tablet daily for the next 5 days. 5 tablet 0  . rosuvastatin (CRESTOR) 10 MG tablet Take 1 tablet (10 mg total) by mouth daily. 90 tablet 1  .  spironolactone (ALDACTONE) 25 MG tablet Take 1 tablet (25 mg total) by mouth 2 (two) times daily. 90 tablet 1  . tiZANidine (ZANAFLEX) 2 MG tablet Take 1 tablet (2 mg total) by mouth every 6 (six) hours as needed for muscle spasms. 360 tablet 1  . traMADol (ULTRAM) 50 MG tablet Take 1 tablet (50 mg total) by mouth every 6 (six) hours as needed. 120 tablet 2  . traZODone (DESYREL) 50 MG tablet trazodone 50 mg tablet    . triamcinolone (NASACORT) 55 MCG/ACT AERO nasal inhaler Place 2 sprays into the nose daily. 1 Inhaler 12  . vitamin C (ASCORBIC ACID) 500 MG tablet Take 500 mg by mouth daily.    [name] zolpidem (AMBIEN) 10 MG tablet TAKE ONE-HALF (1/2) TABLET AT BEDTIME AS NEEDED FOR SLEEP 45 tablet 1   No current facility-administered medications on file prior to visit.   Review of Systems All otherwise neg per pt     Objective:   Physical Exam BP 138/86 (BP Location: Left Arm, Patient Position: Sitting, Cuff Size: Normal)   Pulse 81   Temp 99 F (37.2 C) (Oral)   Ht 5' 7.5" (1.715 m)   Wt 180 lb 8 oz (81.9 kg)   SpO2 97%   BMI 27.85 kg/m  VS noted,  Constitutional: Pt appears in NAD HENT: Head: NCAT.  Right Ear: External ear normal.  Left Ear: External ear normal.  Eyes: . Pupils are equal, round, and reactive to light. Conjunctivae and EOM are normal Nose: without d/c or deformity Neck: Neck supple. Gross normal ROM Cardiovascular: Normal rate and regular rhythm.   Pulmonary/Chest: Effort normal and breath sounds without rales or wheezing.  Right breast with generalized mod enlargement with nondiscrete vague soreness to area just superomedial to areola, also had right axilla mild tender but small LA palpated likely < 1 cm  Abd:  Soft, NT, ND, + BS, no organomegaly Neurological: Pt is alert. At baseline orientation, motor grossly intact Skin: Skin is warm. No rashes, other new lesions, no LE edema Psychiatric: Pt behavior is normal without agitation  All otherwise neg per pt Lab  Results  Component Value Date   WBC 9.9 03/19/2019   HGB 12.2 03/19/2019   HCT 37.2 03/19/2019   PLT 322.0 03/19/2019   GLUCOSE 94 03/19/2019   CHOL 171 03/19/2019   TRIG 122.0 03/19/2019   HDL 65.50 03/19/2019   LDLCALC 81 03/19/2019   ALT 17 03/19/2019   AST 18 03/19/2019   NA 141 03/19/2019   K 3.5 03/19/2019   CL 103 03/19/2019   CREATININE 0.85 03/19/2019   BUN 17 03/19/2019   CO2 29 03/19/2019   TSH 2.80 03/19/2019   INR 1.1 (H) 01/25/2012   HGBA1C 6.5 03/19/2019  Assessment & Plan:

## 2019-12-07 NOTE — Patient Instructions (Signed)
Please take all new medication as prescribed - the antibiotic  Please continue all other medications as before, and refills have been done if requested.  Please have the pharmacy call with any other refills you may need.  Please keep your appointments with your specialists as you may have planned  You will be contacted regarding the referral for: diagnostic mammogram with breast ultrasound

## 2019-12-08 ENCOUNTER — Encounter: Payer: Self-pay | Admitting: Internal Medicine

## 2019-12-09 ENCOUNTER — Encounter: Payer: Self-pay | Admitting: Internal Medicine

## 2019-12-09 NOTE — Assessment & Plan Note (Signed)
stable overall by history and exam, recent data reviewed with pt, and pt to continue medical treatment as before,  to f/u any worsening symptoms or concerns  

## 2019-12-09 NOTE — Assessment & Plan Note (Addendum)
Right side moderate abnormal compared to benign left exam, not clear how related to covid vaccine shots, cant r/o infectious vs other, for empiric antibiotic, and bilat diagnostic mammogram with breast u/s  I spent 30 minutes in preparing to see the patient by review of recent labs, imaging and procedures, obtaining and reviewing separately obtained history, communicating with the patient and family or caregiver, ordering medications, tests or procedures, and documenting clinical information in the EHR including the differential Dx, treatment, and any further evaluation and other management of right breast enlargement

## 2019-12-09 NOTE — Telephone Encounter (Signed)
Do you know of a place that can get her is sooner than 01/11/2020 for a diagnostic mammo (rt breast).

## 2019-12-10 NOTE — Telephone Encounter (Signed)
Wasn't able to get her a sooner appt at [facility]. Sent orders to [facility] to see if they can get her a sooner appt.  Pt is aware

## 2019-12-14 ENCOUNTER — Telehealth: Payer: Self-pay | Admitting: Internal Medicine

## 2019-12-14 DIAGNOSIS — I6523 Occlusion and stenosis of bilateral carotid arteries: Secondary | ICD-10-CM

## 2019-12-14 DIAGNOSIS — G4700 Insomnia, unspecified: Secondary | ICD-10-CM

## 2019-12-14 MED ORDER — ZOLPIDEM TARTRATE 10 MG PO TABS: ORAL_TABLET | ORAL | 1 refills | Status: DC

## 2019-12-14 MED ORDER — AMLODIPINE BESYLATE 10 MG PO TABS: 10.0000 mg | ORAL_TABLET | Freq: Every day | ORAL | 1 refills | Status: DC

## 2019-12-14 NOTE — Telephone Encounter (Signed)
[facility] send short supply of [facility] to [facility] and [facility] day supply to [facility]  1.Medication Requested: amLODipine (NORVASC) 10 MG tablet zolpidem (AMBIEN) 10 MG tablet 2. Pharmacy (Name, Street, [place]): [id number] [place], [place]  3. On Med List: yes  4. Last Visit with PCP: 12/07/19  5. Next visit date with PCP:   Agent: Please be advised that RX refills may take up to 3 business days. We ask that you follow-up with your pharmacy.

## 2019-12-14 NOTE — Telephone Encounter (Signed)
Can you send in the [facility]em to [facility] please?

## 2019-12-14 NOTE — Telephone Encounter (Signed)
Done erx 

## 2019-12-17 ENCOUNTER — Ambulatory Visit (INDEPENDENT_AMBULATORY_CARE_PROVIDER_SITE_OTHER): Payer: Medicare Other | Admitting: *Deleted

## 2019-12-17 ENCOUNTER — Other Ambulatory Visit: Payer: Self-pay

## 2019-12-17 DIAGNOSIS — E538 Deficiency of other specified B group vitamins: Secondary | ICD-10-CM | POA: Diagnosis not present

## 2019-12-17 MED ORDER — CYANOCOBALAMIN 1000 MCG/ML IJ SOLN
1000.0000 ug | Freq: Once | INTRAMUSCULAR | Status: AC
  Administered 2019-12-17: 1000 ug via INTRAMUSCULAR

## 2019-12-17 NOTE — Progress Notes (Signed)
Pls cosign for b12.Marland KitchenJohny Poole

## 2019-12-19 ENCOUNTER — Other Ambulatory Visit: Payer: Self-pay | Admitting: Internal Medicine

## 2019-12-19 DIAGNOSIS — I6523 Occlusion and stenosis of bilateral carotid arteries: Secondary | ICD-10-CM

## 2019-12-19 MED ORDER — AMLODIPINE BESYLATE 10 MG PO TABS: 10.0000 mg | ORAL_TABLET | Freq: Every day | ORAL | 3 refills | Status: DC

## 2019-12-19 NOTE — Telephone Encounter (Signed)
Reviewed chart pt is up-to-date [facility].Marland KitchenJohny Chess

## 2019-12-19 NOTE — Telephone Encounter (Signed)
    [facility] calling to request refill on amLODipine (NORVASC) 10 MG tablet, 90 day supply

## 2019-12-27 DIAGNOSIS — Z119 Encounter for screening for infectious and parasitic diseases, unspecified: Secondary | ICD-10-CM | POA: Diagnosis not present

## 2020-01-09 ENCOUNTER — Ambulatory Visit (INDEPENDENT_AMBULATORY_CARE_PROVIDER_SITE_OTHER): Payer: Medicare Other | Admitting: Internal Medicine

## 2020-01-09 ENCOUNTER — Other Ambulatory Visit: Payer: Self-pay

## 2020-01-09 ENCOUNTER — Encounter: Payer: Self-pay | Admitting: Internal Medicine

## 2020-01-09 VITALS — BP 142/80 | HR 72 | Temp 98.3°F | Ht 67.5 in | Wt 183.0 lb

## 2020-01-09 DIAGNOSIS — R21 Rash and other nonspecific skin eruption: Secondary | ICD-10-CM

## 2020-01-09 DIAGNOSIS — E559 Vitamin D deficiency, unspecified: Secondary | ICD-10-CM | POA: Diagnosis not present

## 2020-01-09 DIAGNOSIS — Z23 Encounter for immunization: Secondary | ICD-10-CM | POA: Diagnosis not present

## 2020-01-09 DIAGNOSIS — F32A Depression, unspecified: Secondary | ICD-10-CM

## 2020-01-09 DIAGNOSIS — F419 Anxiety disorder, unspecified: Secondary | ICD-10-CM | POA: Diagnosis not present

## 2020-01-09 DIAGNOSIS — E538 Deficiency of other specified B group vitamins: Secondary | ICD-10-CM | POA: Diagnosis not present

## 2020-01-09 DIAGNOSIS — I10 Essential (primary) hypertension: Secondary | ICD-10-CM

## 2020-01-09 DIAGNOSIS — F329 Major depressive disorder, single episode, unspecified: Secondary | ICD-10-CM | POA: Diagnosis not present

## 2020-01-09 DIAGNOSIS — I6523 Occlusion and stenosis of bilateral carotid arteries: Secondary | ICD-10-CM | POA: Diagnosis not present

## 2020-01-09 DIAGNOSIS — E785 Hyperlipidemia, unspecified: Secondary | ICD-10-CM | POA: Diagnosis not present

## 2020-01-09 DIAGNOSIS — E611 Iron deficiency: Secondary | ICD-10-CM | POA: Diagnosis not present

## 2020-01-09 LAB — SEDIMENTATION RATE: Sed Rate: 34 mm/hr — ABNORMAL HIGH (ref 0–30)

## 2020-01-09 LAB — TSH: TSH: 1.93 u[IU]/mL (ref 0.35–4.50)

## 2020-01-09 LAB — URINALYSIS, ROUTINE W REFLEX MICROSCOPIC
Bilirubin Urine: NEGATIVE
Ketones, ur: NEGATIVE
Leukocytes,Ua: NEGATIVE
Nitrite: NEGATIVE
Specific Gravity, Urine: 1.02 (ref 1.000–1.030)
Total Protein, Urine: NEGATIVE
Urine Glucose: NEGATIVE
Urobilinogen, UA: 0.2 (ref 0.0–1.0)
pH: 7.5 (ref 5.0–8.0)

## 2020-01-09 LAB — CBC WITH DIFFERENTIAL/PLATELET
Basophils Absolute: 0.1 10*3/uL (ref 0.0–0.1)
Basophils Relative: 0.9 % (ref 0.0–3.0)
Eosinophils Absolute: 0.2 10*3/uL (ref 0.0–0.7)
Eosinophils Relative: 3.4 % (ref 0.0–5.0)
HCT: 38.5 % (ref 36.0–46.0)
Hemoglobin: 12.4 g/dL (ref 12.0–15.0)
Lymphocytes Relative: 24.7 % (ref 12.0–46.0)
Lymphs Abs: 1.5 10*3/uL (ref 0.7–4.0)
MCHC: 32.3 g/dL (ref 30.0–36.0)
MCV: 89.8 fl (ref 78.0–100.0)
Monocytes Absolute: 0.6 10*3/uL (ref 0.1–1.0)
Monocytes Relative: 9.2 % (ref 3.0–12.0)
Neutro Abs: 3.8 10*3/uL (ref 1.4–7.7)
Neutrophils Relative %: 61.8 % (ref 43.0–77.0)
Platelets: 315 10*3/uL (ref 150.0–400.0)
RBC: 4.28 Mil/uL (ref 3.87–5.11)
RDW: 15.5 % (ref 11.5–15.5)
WBC: 6.1 10*3/uL (ref 4.0–10.5)

## 2020-01-09 LAB — LIPID PANEL
Cholesterol: 168 mg/dL (ref 0–200)
HDL: 55.9 mg/dL (ref >39.00)
LDL Cholesterol: 96 mg/dL (ref 0–99)
NonHDL: 112.07
Total CHOL/HDL Ratio: 3
Triglycerides: 80 mg/dL (ref 0.0–149.0)
VLDL: 16 mg/dL (ref 0.0–40.0)

## 2020-01-09 LAB — HEPATIC FUNCTION PANEL
ALT: 21 U/L (ref 0–35)
AST: 18 U/L (ref 0–37)
Albumin: 4.4 g/dL (ref 3.5–5.2)
Alkaline Phosphatase: 61 U/L (ref 39–117)
Bilirubin, Direct: 0.1 mg/dL (ref 0.0–0.3)
Total Bilirubin: 0.4 mg/dL (ref 0.2–1.2)
Total Protein: 7.3 g/dL (ref 6.0–8.3)

## 2020-01-09 LAB — BASIC METABOLIC PANEL
BUN: 12 mg/dL (ref 6–23)
CO2: 31 mEq/L (ref 19–32)
Calcium: 9.3 mg/dL (ref 8.4–10.5)
Chloride: 102 mEq/L (ref 96–112)
Creatinine, Ser: 0.79 mg/dL (ref 0.40–1.20)
GFR: 87.9 mL/min (ref >60.00)
Glucose, Bld: 100 mg/dL — ABNORMAL HIGH (ref 70–99)
Potassium: 3.6 mEq/L (ref 3.5–5.1)
Sodium: 140 mEq/L (ref 135–145)

## 2020-01-09 LAB — VITAMIN B12: Vitamin B-12: 1266 pg/mL — ABNORMAL HIGH (ref 211–911)

## 2020-01-09 LAB — IBC PANEL
Iron: 65 ug/dL (ref 42–145)
Saturation Ratios: 17.8 % — ABNORMAL LOW (ref 20.0–50.0)
Transferrin: 261 mg/dL (ref 212.0–360.0)

## 2020-01-09 LAB — C-REACTIVE PROTEIN: CRP: <1 mg/dL (ref 0.5–20.0)

## 2020-01-09 LAB — VITAMIN D 25 HYDROXY (VIT D DEFICIENCY, FRACTURES): VITD: 42.82 ng/mL (ref 30.00–100.00)

## 2020-01-09 NOTE — Assessment & Plan Note (Signed)
For replacement 

## 2020-01-09 NOTE — Assessment & Plan Note (Addendum)
stable overall by history and exam, recent data reviewed with pt, and pt to continue medical treatment as before,  to f/u any worsening symptoms or concerns  I spent 32 minutes in preparing to see the patient by review of recent labs, imaging and procedures, obtaining and reviewing separately obtained history, communicating with the patient and family or caregiver, ordering medications, tests or procedures, and documenting clinical information in the EHR including the differential Dx, treatment, and any further evaluation and other management of HTN, HLD, anxiety depression, vit d and b12 def, rash

## 2020-01-09 NOTE — Assessment & Plan Note (Signed)
Etiology unclear  - ? Vasculitis - for lab, also refer dermatology

## 2020-01-09 NOTE — Assessment & Plan Note (Signed)
stable overall by history and exam, recent data reviewed with pt, and pt to continue medical treatment as before,  to f/u any worsening symptoms or concerns  

## 2020-01-09 NOTE — Progress Notes (Signed)
Subjective:    Patient ID: Laura Poole, female    DOB: [birth year], 69 y.o.   MRN: [id number]  HPI  Here to f/u; overall doing ok,  Pt denies chest pain, increasing sob or doe, wheezing, orthopnea, PND, increased LE swelling, palpitations, dizziness or syncope.  Pt denies new neurological symptoms such as new headache, or facial or extremity weakness or numbness.  Pt denies polydipsia, polyuria, or low sugar episode.  Pt states overall good compliance with meds, mostly trying to follow appropriate diet, with wt overall stable,  but little exercise however  Right breast pain improved, for diag mammogram apr 2  Also with 3 day onset bipedal mild swelling with erythem rash to medial legs above the ankles nontender nonpruritic left > right, very mild, juts wants to be reassured she does not have shingles.  Denies worsening depressive symptoms, suicidal ideation, or panic Past Medical History:  Diagnosis Date  . Bilateral leg cramps 09/01/2015  . Carotid stenosis 06/23/2014  . Chest pain   . Essential hypertension 01/07/2010   Qualifier: Diagnosis of  By: Linna Darner MD, Gwyndolyn Saxon    . GERD (gastroesophageal reflux disease)   . H/O: hysterectomy   . Heart murmur 03/30/2017  . History of breast cancer 2006  . History of colonic polyps   . HLD (hyperlipidemia)   . HTN (hypertension)   . Mitral regurgitation 08/05/2015  . Personal history of chemotherapy 2006   Left Breast Cancer  . Personal history of radiation therapy 2006   Left Breast Cancer  . Snoring 03/30/2017   Past Surgical History:  Procedure Laterality Date  . ABDOMINAL HYSTERECTOMY    . BREAST LUMPECTOMY Left 2006  . COLONOSCOPY W/ POLYPECTOMY  2004, 2009   Dr Watt Climes  . TUBAL LIGATION      reports that she has never smoked. She has never used smokeless tobacco. She reports current alcohol use of about 2.0 standard drinks of alcohol per week. She reports that she does not use drugs. family history includes Aneurysm in her brother;  Asthma in an other family member; Breast cancer in her paternal grandmother; Coronary artery disease in her mother; Heart attack (age of onset: 79) in her father; Hyperlipidemia in an other family member; Hypertension in her mother; Sudden death in an other family member. No Known Allergies Current Outpatient Medications on File Prior to Visit  Medication Sig Dispense Refill  . acetaminophen (TYLENOL) 500 MG tablet Take 500 mg by mouth every 6 (six) hours as needed for mild pain or headache.    [name] amLODipine (NORVASC) 10 MG tablet Take 1 tablet (10 mg total) by mouth daily. 90 tablet 3  . aspirin 81 MG tablet Take 1 tablet (81 mg total) by mouth daily. 30 tablet 6  . benzonatate (TESSALON) 100 MG capsule Take 1 capsule (100 mg total) by mouth 2 (two) times daily as needed for cough. 20 capsule 0  . Coenzyme Q10 (CO Q 10) 100 MG CAPS Take 1 capsule by mouth every morning.    . diphenhydrAMINE (BENADRYL) 25 mg capsule Take 25 mg by mouth every 6 (six) hours as needed for allergies.    . DULoxetine (CYMBALTA) 60 MG capsule Take 1 capsule (60 mg total) by mouth daily. 90 capsule 3  . famotidine (PEPCID AC) 10 MG tablet Take 1 tablet (10 mg total) by mouth 2 (two) times daily. 60 tablet 1  . fexofenadine (ALLEGRA) 180 MG tablet Take 1 tablet (180 mg total) by mouth daily. 90 tablet 3  .  gabapentin (NEURONTIN) 100 MG capsule Take 2 capsules (200 mg total) by mouth at bedtime. 180 capsule 1  . labetalol (NORMODYNE) 300 MG tablet Take 1 tablet (300 mg total) by mouth daily. 90 tablet 1  . magnesium oxide (MAG-OX) 400 MG tablet 1 tablet by mouth at bedtime    . meloxicam (MOBIC) 7.5 MG tablet TAKE 1 TABLET BY MOUTH EVERY DAY 90 tablet 1  . rosuvastatin (CRESTOR) 10 MG tablet Take 1 tablet (10 mg total) by mouth daily. 90 tablet 1  . spironolactone (ALDACTONE) 25 MG tablet Take 1 tablet (25 mg total) by mouth 2 (two) times daily. 90 tablet 1  . tiZANidine (ZANAFLEX) 2 MG tablet Take 1 tablet (2 mg total) by  mouth every 6 (six) hours as needed for muscle spasms. 360 tablet 1  . traMADol (ULTRAM) 50 MG tablet Take 1 tablet (50 mg total) by mouth every 6 (six) hours as needed. 120 tablet 2  . traZODone (DESYREL) 50 MG tablet trazodone 50 mg tablet    . triamcinolone (NASACORT) 55 MCG/ACT AERO nasal inhaler Place 2 sprays into the nose daily. 1 Inhaler 12  . vitamin C (ASCORBIC ACID) 500 MG tablet Take 500 mg by mouth daily.    [name] zolpidem (AMBIEN) 10 MG tablet TAKE ONE-HALF (1/2) TABLET AT BEDTIME AS NEEDED FOR SLEEP 45 tablet 1   No current facility-administered medications on file prior to visit.   Review of Systems All otherwise neg per pt     Objective:   Physical Exam BP (!) 142/80   Pulse 72   Temp 98.3 F (36.8 C)   Ht 5' 7.5" (1.715 m)   Wt 183 lb (83 kg)   SpO2 99%   BMI 28.24 kg/m  VS noted,  Constitutional: Pt appears in NAD HENT: Head: NCAT.  Right Ear: External ear normal.  Left Ear: External ear normal.  Eyes: . Pupils are equal, round, and reactive to light. Conjunctivae and EOM are normal Nose: without d/c or deformity Neck: Neck supple. Gross normal ROM Cardiovascular: Normal rate and regular rhythm.   Pulmonary/Chest: Effort normal and breath sounds without rales or wheezing.  Abd:  Soft, NT, ND, + BS, no organomegaly Neurological: Pt is alert. At baseline orientation, motor grossly intact Skin: Skin is warm. Mild nontender nonraised non vesicular erythem rashes to medial bilateral distal legs above ankles, other new lesions, no LE edema Psychiatric: Pt behavior is normal without agitation  All otherwise neg per pt Lab Results  Component Value Date   WBC 6.1 01/09/2020   HGB 12.4 01/09/2020   HCT 38.5 01/09/2020   PLT 315.0 01/09/2020   GLUCOSE 100 (H) 01/09/2020   CHOL 168 01/09/2020   TRIG 80.0 01/09/2020   HDL 55.90 01/09/2020   LDLCALC 96 01/09/2020   ALT 21 01/09/2020   AST 18 01/09/2020   NA 140 01/09/2020   K 3.6 01/09/2020   CL 102 01/09/2020     CREATININE 0.79 01/09/2020   BUN 12 01/09/2020   CO2 31 01/09/2020   TSH 1.93 01/09/2020   INR 1.1 (H) 01/25/2012   HGBA1C 6.5 03/19/2019      Assessment & Plan:

## 2020-01-09 NOTE — Patient Instructions (Signed)
You had the pneumovax pneumonia shot today  You will be contacted regarding the referral for: dermatology  Please continue all other medications as before, and refills have been done if requested.  Please have the pharmacy call with any other refills you may need.  Please continue your efforts at being more active, low cholesterol diet, and weight control.  You are otherwise up to date with prevention measures today.  Please keep your appointments with your specialists as you may have planned  Please go to the LAB at the blood drawing area for the tests to be done  You will be contacted by phone if any changes need to be made immediately.  Otherwise, you will receive a letter about your results with an explanation, but please check with MyChart first.  Please remember to sign up for MyChart if you have not done so, as this will be important to you in the future with finding out test results, communicating by private email, and scheduling acute appointments online when needed.  Please make an Appointment to return in 6 months, or sooner if needed

## 2020-01-09 NOTE — Assessment & Plan Note (Signed)
For replacement, for lab f/u

## 2020-01-11 ENCOUNTER — Other Ambulatory Visit: Payer: Self-pay

## 2020-01-11 ENCOUNTER — Ambulatory Visit
Admission: RE | Admit: 2020-01-11 | Discharge: 2020-01-11 | Disposition: A | Discharge status: Home / Self Care | Payer: Medicare Other | Source: Ambulatory Visit | Attending: Internal Medicine | Admitting: Internal Medicine

## 2020-01-11 ENCOUNTER — Ambulatory Visit: Admission: RE | Admit: 2020-01-11 | Payer: Medicare Other | Source: Ambulatory Visit

## 2020-01-11 DIAGNOSIS — N63 Unspecified lump in unspecified breast: Secondary | ICD-10-CM

## 2020-01-11 DIAGNOSIS — Z853 Personal history of malignant neoplasm of breast: Secondary | ICD-10-CM | POA: Diagnosis not present

## 2020-01-11 DIAGNOSIS — R928 Other abnormal and inconclusive findings on diagnostic imaging of breast: Secondary | ICD-10-CM | POA: Diagnosis not present

## 2020-01-11 DIAGNOSIS — N644 Mastodynia: Secondary | ICD-10-CM

## 2020-01-17 ENCOUNTER — Other Ambulatory Visit: Payer: Self-pay

## 2020-01-17 ENCOUNTER — Ambulatory Visit (INDEPENDENT_AMBULATORY_CARE_PROVIDER_SITE_OTHER): Payer: Medicare Other | Admitting: *Deleted

## 2020-01-17 DIAGNOSIS — E538 Deficiency of other specified B group vitamins: Secondary | ICD-10-CM

## 2020-01-17 MED ORDER — CYANOCOBALAMIN 1000 MCG/ML IJ SOLN
1000.0000 ug | Freq: Once | INTRAMUSCULAR | Status: AC
  Administered 2020-01-17: 1000 ug via INTRAMUSCULAR

## 2020-01-17 NOTE — Progress Notes (Signed)
Pls cosign for B12 inj../lmb  

## 2020-02-18 ENCOUNTER — Ambulatory Visit (INDEPENDENT_AMBULATORY_CARE_PROVIDER_SITE_OTHER): Payer: Medicare Other | Admitting: *Deleted

## 2020-02-18 ENCOUNTER — Other Ambulatory Visit: Payer: Self-pay

## 2020-02-18 DIAGNOSIS — E538 Deficiency of other specified B group vitamins: Secondary | ICD-10-CM | POA: Diagnosis not present

## 2020-02-18 MED ORDER — CYANOCOBALAMIN 1000 MCG/ML IJ SOLN
1000.0000 ug | Freq: Once | INTRAMUSCULAR | Status: AC
  Administered 2020-02-18: 1000 ug via INTRAMUSCULAR

## 2020-02-18 NOTE — Progress Notes (Signed)
Pls cosign for B12 inj../lmb  

## 2020-03-02 ENCOUNTER — Encounter: Payer: Self-pay | Admitting: Internal Medicine

## 2020-03-03 ENCOUNTER — Telehealth (INDEPENDENT_AMBULATORY_CARE_PROVIDER_SITE_OTHER): Payer: Medicare Other | Admitting: Family

## 2020-03-03 DIAGNOSIS — J209 Acute bronchitis, unspecified: Secondary | ICD-10-CM | POA: Diagnosis not present

## 2020-03-03 MED ORDER — HYDROCODONE-HOMATROPINE 5-1.5 MG/5ML PO SYRP: 5.0000 mL | ORAL_SOLUTION | Freq: Three times a day (TID) | ORAL | 0 refills | Status: DC | PRN

## 2020-03-03 MED ORDER — AZITHROMYCIN 250 MG PO TABS: ORAL_TABLET | ORAL | 0 refills | Status: DC

## 2020-03-03 NOTE — Progress Notes (Signed)
Laura Poole is a 69 y.o. female with the following history as recorded in [employer]:  Patient Active Problem List   Diagnosis Date Noted  . Rash 01/09/2020  . Swelling of breast 12/07/2019  . Neck pain on right side 09/21/2019  . Upper back pain on right side 09/21/2019  . Anxiety and depression 09/21/2019  . Scapular dyskinesis 09/11/2019  . Nonallopathic lesion of rib cage 09/11/2019  . Nonallopathic lesion of cervical region 09/11/2019  . Urinary frequency 08/08/2019  . Low back pain 08/08/2019  . Allergic rhinitis 08/08/2019  . Need for influenza vaccination 06/15/2019  . B12 deficiency 03/22/2019  . Microhematuria 03/22/2019  . Posterior neck pain 03/16/2019  . 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 Snoring 03/30/2017  . Heart murmur 03/30/2017  . Insomnia 03/30/2017  . Routine general medical examination at a health care facility 05/17/2016  . Mitral regurgitation 08/05/2015  . Leukocytosis 05/18/2015  . Patellofemoral pain syndrome 10/30/2014  . Carotid stenosis 06/23/2014  . Nonallopathic lesion of thoracic region 03/06/2014  . 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 Bruit 10/20/2011  . Hyperlipidemia 01/07/2010  . Essential hypertension 01/07/2010  . COLONIC POLYPS, HX OF 01/07/2010  . BUNIONS, BILATERAL 01/05/2010  . Nonspecific abnormal electrocardiogram (ECG) (EKG) 01/05/2010  . Vitamin D deficiency 06/17/2009  . GERD 03/19/2008  . BREAST CANCER, HX OF 03/19/2008    Current Outpatient Medications  Medication Sig Dispense Refill  . acetaminophen (TYLENOL) 500 MG tablet Take 500 mg by mouth every 6 (six) hours as needed for mild pain or headache.    [name] amLODipine (NORVASC) 10 MG tablet Take 1 tablet (10 mg total) by mouth daily. 90 tablet 3  . aspirin 81 MG tablet Take 1 tablet (81 mg total) by mouth daily. 30 tablet 6  . Coenzyme Q10 (CO Q 10) 100 MG CAPS Take 1 capsule by mouth every morning.    .  DULoxetine (CYMBALTA) 60 MG capsule Take 1 capsule (60 mg total) by mouth daily. 90 capsule 3  . gabapentin (NEURONTIN) 100 MG capsule Take 2 capsules (200 mg total) by mouth at bedtime. 180 capsule 1  . labetalol (NORMODYNE) 300 MG tablet Take 1 tablet (300 mg total) by mouth daily. 90 tablet 1  . magnesium oxide (MAG-OX) 400 MG tablet 1 tablet by mouth at bedtime    . meloxicam (MOBIC) 7.5 MG tablet TAKE 1 TABLET BY MOUTH EVERY DAY 90 tablet 1  . rosuvastatin (CRESTOR) 10 MG tablet Take 1 tablet (10 mg total) by mouth daily. 90 tablet 1  . spironolactone (ALDACTONE) 25 MG tablet Take 1 tablet (25 mg total) by mouth 2 (two) times daily. 90 tablet 1  . traZODone (DESYREL) 50 MG tablet trazodone 50 mg tablet    . triamcinolone (NASACORT) 55 MCG/ACT AERO nasal inhaler Place 2 sprays into the nose daily. 1 Inhaler 12  . vitamin C (ASCORBIC ACID) 500 MG tablet Take 500 mg by mouth daily.    [name] zolpidem (AMBIEN) 10 MG tablet TAKE ONE-HALF (1/2) TABLET AT BEDTIME AS NEEDED FOR SLEEP 45 tablet 1  . azithromycin (ZITHROMAX) 250 MG tablet 2 tabs po qd x 1 day; 1 tablet per day x 4 days; 6 tablet 0  . diphenhydrAMINE (BENADRYL) 25 mg capsule Take 25 mg by mouth every 6 (six) hours as needed for allergies.    . famotidine (PEPCID AC) 10 MG tablet Take 1 tablet (10 mg total) by mouth 2 (two)  times daily. 60 tablet 1  . HYDROcodone-homatropine (HYCODAN) 5-1.5 MG/5ML syrup Take 5 mLs by mouth every 8 (eight) hours as needed for cough. 120 mL 0  . tiZANidine (ZANAFLEX) 2 MG tablet Take 1 tablet (2 mg total) by mouth every 6 (six) hours as needed for muscle spasms. 360 tablet 1  . traMADol (ULTRAM) 50 MG tablet Take 1 tablet (50 mg total) by mouth every 6 (six) hours as needed. 120 tablet 2   No current facility-administered medications for this visit.    Allergies: Patient has no known allergies.  Past Medical History:  Diagnosis Date  . Bilateral leg cramps 09/01/2015  . Carotid stenosis 06/23/2014  . Chest  pain   . Essential hypertension 01/07/2010   Qualifier: Diagnosis of  By: Linna Darner MD, Gwyndolyn Saxon    . GERD (gastroesophageal reflux disease)   . H/O: hysterectomy   . Heart murmur 03/30/2017  . History of breast cancer 2006  . History of colonic polyps   . HLD (hyperlipidemia)   . HTN (hypertension)   . Mitral regurgitation 08/05/2015  . Personal history of chemotherapy 2006   Left Breast Cancer  . Personal history of radiation therapy 2006   Left Breast Cancer  . Snoring 03/30/2017    Past Surgical History:  Procedure Laterality Date  . ABDOMINAL HYSTERECTOMY    . BREAST LUMPECTOMY Left 2006  . COLONOSCOPY W/ POLYPECTOMY  2004, 2009   Dr Watt Climes  . TUBAL LIGATION      Family History  Problem Relation Age of Onset  . Heart attack Father 4  . Hypertension Mother   . Coronary artery disease Mother   . Aneurysm Brother   . Asthma Other        nephew  . Hyperlipidemia Other        nephew  . Sudden death Other        nephew  . Breast cancer Paternal Grandmother   . Colon cancer Neg Hx     Social History   Tobacco Use  . Smoking status: Never Smoker  . Smokeless tobacco: Never Used  Substance Use Topics  . Alcohol use: Yes    Alcohol/week: 2.0 standard drinks    Types: 2 Cans of beer per week    Comment: rarely    Subjective:    I connected with Laura Poole on 03/03/20 at 10:20 AM EDT by a video enabled telemedicine application and verified that I am speaking with the correct person using two identifiers.   I discussed the limitations of evaluation and management by telemedicine and the availability of in person appointments. The patient expressed understanding and agreed to proceed. Provider in office/ patient is at home; provider and patient are only 2 people on video call.   Complaining of worsening cough/ congestion; has taken both COVID vaccines and rapid COVID test was negative; does have seasonal allergies and feels that they were triggered this past weekend  while outside at a grandson's baseball game; does occasionally get bronchitis; denies any chest pain, shortness of breath or fever; is using OTC Zyrtec, Mucinex and cough drops; also purchased allergy nasal spray to help with drainage;    Objective:  There were no vitals filed for this visit.  General: Well developed, well nourished, in no acute distress  Lungs: Respirations unlabored;  Neurologic: Alert and oriented; speech intact; face symmetrical; moves all extremities well; CNII-XII intact without focal deficit   Assessment:  1. Acute bronchitis, unspecified organism     Plan:  Suspect allergic component; continue OTC medications; Rx for Z-pak and Hycodan; increase fluids, rest and follow-up worse, no better.  No follow-ups on file.  No orders of the defined types were placed in this encounter.   Requested Prescriptions   Signed Prescriptions Disp Refills  . azithromycin (ZITHROMAX) 250 MG tablet 6 tablet 0    Sig: 2 tabs po qd x 1 day; 1 tablet per day x 4 days;  . HYDROcodone-homatropine (HYCODAN) 5-1.5 MG/5ML syrup 120 mL 0    Sig: Take 5 mLs by mouth every 8 (eight) hours as needed for cough.

## 2020-03-05 ENCOUNTER — Ambulatory Visit: Payer: Medicare Other | Admitting: Physician Assistant

## 2020-03-19 ENCOUNTER — Ambulatory Visit: Payer: Medicare Other

## 2020-03-20 ENCOUNTER — Ambulatory Visit (INDEPENDENT_AMBULATORY_CARE_PROVIDER_SITE_OTHER): Payer: Medicare Other

## 2020-03-20 ENCOUNTER — Other Ambulatory Visit: Payer: Self-pay

## 2020-03-20 ENCOUNTER — Telehealth: Payer: Self-pay

## 2020-03-20 DIAGNOSIS — E538 Deficiency of other specified B group vitamins: Secondary | ICD-10-CM | POA: Diagnosis not present

## 2020-03-20 DIAGNOSIS — G4700 Insomnia, unspecified: Secondary | ICD-10-CM

## 2020-03-20 MED ORDER — CYANOCOBALAMIN 1000 MCG/ML IJ SOLN
1000.0000 ug | Freq: Once | INTRAMUSCULAR | Status: AC
  Administered 2020-03-20: 1000 ug via INTRAMUSCULAR

## 2020-03-20 NOTE — Telephone Encounter (Signed)
Patient stopped at check out desk and states that she is needing all her medications refilled, "especially the ambien." Please advise.   [id number] - [place], [place] - [address]

## 2020-03-20 NOTE — Progress Notes (Signed)
Medical screening examination/treatment/procedure(s) were performed by non-physician practitioner and as supervising physician I was immediately available for consultation/collaboration. I agree with above. Kassidy Dockendorf, MD   

## 2020-03-21 ENCOUNTER — Other Ambulatory Visit: Payer: Self-pay

## 2020-03-21 DIAGNOSIS — I6523 Occlusion and stenosis of bilateral carotid arteries: Secondary | ICD-10-CM

## 2020-03-21 DIAGNOSIS — I10 Essential (primary) hypertension: Secondary | ICD-10-CM

## 2020-03-21 DIAGNOSIS — E785 Hyperlipidemia, unspecified: Secondary | ICD-10-CM

## 2020-03-21 MED ORDER — LABETALOL HCL 300 MG PO TABS: 300.0000 mg | ORAL_TABLET | Freq: Every day | ORAL | 1 refills | Status: DC

## 2020-03-21 MED ORDER — ROSUVASTATIN CALCIUM 10 MG PO TABS: 10.0000 mg | ORAL_TABLET | Freq: Every day | ORAL | 1 refills | Status: DC

## 2020-03-21 MED ORDER — TRIAMCINOLONE ACETONIDE 55 MCG/ACT NA AERO: 2.0000 | INHALATION_SPRAY | Freq: Every day | NASAL | 12 refills | Status: AC

## 2020-03-21 MED ORDER — AMLODIPINE BESYLATE 10 MG PO TABS: 10.0000 mg | ORAL_TABLET | Freq: Every day | ORAL | 3 refills | Status: DC

## 2020-03-21 MED ORDER — SPIRONOLACTONE 25 MG PO TABS: 25.0000 mg | ORAL_TABLET | Freq: Two times a day (BID) | ORAL | 1 refills | Status: DC

## 2020-03-22 MED ORDER — DULOXETINE HCL 60 MG PO CPEP
60.0000 mg | ORAL_CAPSULE | Freq: Every day | ORAL | 2 refills | Status: DC
Start: 2020-03-22 — End: 2020-09-15

## 2020-03-22 MED ORDER — GABAPENTIN 100 MG PO CAPS: 200.0000 mg | ORAL_CAPSULE | Freq: Every day | ORAL | 1 refills | Status: DC

## 2020-03-22 MED ORDER — TRAMADOL HCL 50 MG PO TABS: 50.0000 mg | ORAL_TABLET | Freq: Four times a day (QID) | ORAL | 2 refills | Status: DC | PRN

## 2020-03-22 MED ORDER — ZOLPIDEM TARTRATE 10 MG PO TABS: ORAL_TABLET | ORAL | 1 refills | Status: DC

## 2020-03-22 NOTE — Telephone Encounter (Signed)
Controlled substanes done erx  Please to refill all other meds - Please refill as per office routine med refill policy (all routine meds refilled for 3 mo or monthly per pt preference up to one year from last visit, then month to month grace period for 3 mo, then further med refills will have to be denied)

## 2020-04-21 ENCOUNTER — Ambulatory Visit (INDEPENDENT_AMBULATORY_CARE_PROVIDER_SITE_OTHER): Payer: Medicare Other | Admitting: *Deleted

## 2020-04-21 ENCOUNTER — Other Ambulatory Visit: Payer: Self-pay

## 2020-04-21 DIAGNOSIS — E538 Deficiency of other specified B group vitamins: Secondary | ICD-10-CM | POA: Diagnosis not present

## 2020-04-21 MED ORDER — CYANOCOBALAMIN 1000 MCG/ML IJ SOLN
1000.0000 ug | Freq: Once | INTRAMUSCULAR | Status: AC
  Administered 2020-04-21: 1000 ug via INTRAMUSCULAR

## 2020-04-21 NOTE — Progress Notes (Signed)
Pls cosign for B12 inj../lmb  

## 2020-05-13 ENCOUNTER — Other Ambulatory Visit: Payer: Medicare Other

## 2020-05-22 ENCOUNTER — Ambulatory Visit: Payer: Medicare Other

## 2020-05-23 ENCOUNTER — Encounter: Payer: Self-pay | Admitting: Cardiology

## 2020-05-23 ENCOUNTER — Telehealth (INDEPENDENT_AMBULATORY_CARE_PROVIDER_SITE_OTHER): Payer: Medicare Other | Admitting: Cardiology

## 2020-05-23 ENCOUNTER — Other Ambulatory Visit: Payer: Self-pay

## 2020-05-23 VITALS — BP 145/95 | HR 88 | Ht 67.5 in | Wt 179.0 lb

## 2020-05-23 DIAGNOSIS — I340 Nonrheumatic mitral (valve) insufficiency: Secondary | ICD-10-CM

## 2020-05-23 DIAGNOSIS — I10 Essential (primary) hypertension: Secondary | ICD-10-CM

## 2020-05-23 DIAGNOSIS — Z87898 Personal history of other specified conditions: Secondary | ICD-10-CM

## 2020-05-23 DIAGNOSIS — I6523 Occlusion and stenosis of bilateral carotid arteries: Secondary | ICD-10-CM | POA: Diagnosis not present

## 2020-05-23 NOTE — Progress Notes (Signed)
Virtual Visit via Video Note   This visit type was conducted due to national recommendations for restrictions regarding the COVID-19 Pandemic (e.g. social distancing) in an effort to limit this patient's exposure and mitigate transmission in our community.  Due to her co-morbid illnesses, this patient is at least at moderate risk for complications without adequate follow up.  This format is felt to be most appropriate for this patient at this time.  All issues noted in this document were discussed and addressed.  A limited physical exam was performed with this format.  Please refer to the patient's chart for her consent to telehealth for [facility].     Evaluation Performed:  Follow-up visit  This visit type was conducted due to national recommendations for restrictions regarding the COVID-19 Pandemic (e.g. social distancing).  This format is felt to be most appropriate for this patient at this time.  All issues noted in this document were discussed and addressed.  No physical exam was performed (except for noted visual exam findings with Video Visits).  Please refer to the patient's chart (MyChart message for video visits and phone note for telephone visits) for the patient's consent to telehealth for [facility].  Date:  05/23/2020   ID:  Laura Poole, DOB [birth year], MRN [id number]  Patient Location:  Home  Provider location:   [facility]   Cardiology Office Note    Date:  05/23/2020   ID:  Laura Poole, DOB 1951/07/18, MRN [id number]  PCP:  Biagio Borg, MD  Cardiologist:  Fransico Him, MD   Chief Complaint  Patient presents with  . Hypertension  . Mitral Regurgitation    History of Present Illness:  Laura Poole is a 69 y.o. female with history of breast cancerand atypical chest pain as well as exertional dyspnea.  Patient has had a history of nonexertional chest pain. Last Myoview obtained in April 2011 showed no evidence for ischemia. She had a  cardiac catheterization in April 2013 which showed no coronary disease. She had a carotid artery ultrasound in 2015, which showed elevated left ICA is velocity likely due to tortuosity. Her last echocardiogram obtained on 09/10/2015 showed EF 35-45%, grade 1 diastolic dysfunction, moderate LVH, mild MR. She has chronic dyspnea on exertion.  Outpatient arterial Doppler for leg cramps was ordered which showed normal blood flow without obvious lower extremity stenosis.  She is here today for followup and is doing well.  She denies any chest pain or pressure, SOB, DOE, PND, orthopnea, dizziness, palpitations or syncope. She occasionally has some mild pedal edema but thinks it could have been due to prior foot surgery.  She is compliant with her meds and is tolerating meds with no SE.    Past Medical History:  Diagnosis Date  . Bilateral leg cramps 09/01/2015  . Carotid stenosis 06/23/2014  . Chest pain   . Essential hypertension 01/07/2010   Qualifier: Diagnosis of  By: Linna Darner MD, Gwyndolyn Saxon    . GERD (gastroesophageal reflux disease)   . H/O: hysterectomy   . Heart murmur 03/30/2017  . History of breast cancer 2006  . History of colonic polyps   . HLD (hyperlipidemia)   . HTN (hypertension)   . Mitral regurgitation 08/05/2015  . Personal history of chemotherapy 2006   Left Breast Cancer  . Personal history of radiation therapy 2006   Left Breast Cancer  . Snoring 03/30/2017    Past Surgical History:  Procedure Laterality Date  . ABDOMINAL HYSTERECTOMY    .  BREAST LUMPECTOMY Left 2006  . COLONOSCOPY W/ POLYPECTOMY  2004, 2009   Dr Watt Climes  . TUBAL LIGATION      Current Medications: Current Meds  Medication Sig  . acetaminophen (TYLENOL) 500 MG tablet Take 500 mg by mouth every 6 (six) hours as needed for mild pain or headache.  [name] amLODipine (NORVASC) 10 MG tablet Take 1 tablet (10 mg total) by mouth daily.  [name] aspirin 81 MG tablet Take 1 tablet (81 mg total) by mouth daily.  .  diphenhydrAMINE (BENADRYL) 25 mg capsule Take 25 mg by mouth every 6 (six) hours as needed for allergies.  . DULoxetine (CYMBALTA) 60 MG capsule Take 1 capsule (60 mg total) by mouth daily.  . famotidine (PEPCID AC) 10 MG tablet Take 1 tablet (10 mg total) by mouth 2 (two) times daily.  . fexofenadine-pseudoephedrine (ALLEGRA-D 24) 180-240 MG 24 hr tablet Take 1 tablet by mouth daily.  [name] gabapentin (NEURONTIN) 100 MG capsule Take 2 capsules (200 mg total) by mouth at bedtime.  [name] labetalol (NORMODYNE) 300 MG tablet Take 1 tablet (300 mg total) by mouth daily.  . rosuvastatin (CRESTOR) 10 MG tablet Take 1 tablet (10 mg total) by mouth daily.  [name] spironolactone (ALDACTONE) 25 MG tablet Take 1 tablet (25 mg total) by mouth 2 (two) times daily.  . traMADol (ULTRAM) 50 MG tablet Take 1 tablet (50 mg total) by mouth every 6 (six) hours as needed.  . triamcinolone (NASACORT) 55 MCG/ACT AERO nasal inhaler Place 2 sprays into the nose daily.  . vitamin C (ASCORBIC ACID) 500 MG tablet Take 500 mg by mouth daily.  [name] zolpidem (AMBIEN) 10 MG tablet TAKE ONE-HALF (1/2) TABLET AT BEDTIME AS NEEDED FOR SLEEP    Allergies:   Patient has no known allergies.   Social History   Socioeconomic History  . Marital status: Single    Spouse name: Not on file  . Number of children: Not on file  . Years of education: Not on file  . Highest education level: Not on file  Occupational History  . Not on file  Tobacco Use  . Smoking status: Never Smoker  . Smokeless tobacco: Never Used  Vaping Use  . Vaping Use: Never used  Substance and Sexual Activity  . Alcohol use: Yes    Alcohol/week: 2.0 standard drinks    Types: 2 Cans of beer per week    Comment: rarely  . Drug use: No  . Sexual activity: Not on file  Other Topics Concern  . Not on file  Social History Narrative  . Not on file   Social Determinants of Health   Financial Resource Strain:   . Difficulty of Paying Living Expenses:   Food Insecurity:    . Worried About Charity fundraiser in the Last Year:   . Arboriculturist in the Last Year:   Transportation Needs:   . Film/video editor (Medical):   [name] Lack of Transportation (Non-Medical):   Physical Activity:   . Days of Exercise per Week:   . Minutes of Exercise per Session:   Stress:   . Feeling of Stress :   Social Connections:   . Frequency of Communication with Friends and Family:   . Frequency of Social Gatherings with Friends and Family:   . Attends Religious Services:   . Active Member of Clubs or Organizations:   . Attends Archivist Meetings:   [name] Marital Status:  Family History:  The patient's family history includes Aneurysm in her brother; Asthma in an other family member; Breast cancer in her paternal grandmother; Coronary artery disease in her mother; Heart attack (age of onset: 46) in her father; Hyperlipidemia in an other family member; Hypertension in her mother; Sudden death in an other family member.   ROS:   Please see the history of present illness.    ROS All other systems reviewed and are negative.  No flowsheet data found.     PHYSICAL EXAM:   VS:  BP (!) 145/95   Pulse 88   Ht 5' 7.5" (1.715 m)   Wt 179 lb (81.2 kg)   SpO2 100%   BMI 27.62 kg/m     General:  WD, WN in NAD HEENT:  Benign NECK: no JVD Heart:  No audible murmurs LUNGS:  No audible wheezes ABD:  No abdominal enlargement EXT:  No edema NEURO:  A&O x 3 PSYCH:  Normal affect  Wt Readings from Last 3 Encounters:  05/23/20 179 lb (81.2 kg)  01/09/20 183 lb (83 kg)  12/07/19 180 lb 8 oz (81.9 kg)      Studies/Labs Reviewed:   EKG:  EKG is not ordered today.    Recent Labs: 01/09/2020: ALT 21; BUN 12; Creatinine, Ser 0.79; Hemoglobin 12.4; Platelets 315.0; Potassium 3.6; Sodium 140; TSH 1.93   Lipid Panel    Component Value Date/Time   CHOL 168 01/09/2020 0929   TRIG 80.0 01/09/2020 0929   HDL 55.90 01/09/2020 0929   CHOLHDL 3 01/09/2020 0929    VLDL 16.0 01/09/2020 0929   LDLCALC 96 01/09/2020 0929    Additional studies/ records that were reviewed today include:  none  ASSESSMENT:    1. Essential hypertension   2. Nonrheumatic mitral valve regurgitation   3. Hx of chest pain     PLAN:  In order of problems listed above:  1.  HTN -BP borderline controlled>>she cannot take BP meds during the day so she takes her meds at night.   -2D echo showed moderate LVH and SAM in 2018 -cardiac MRI 2018 showed normal LVF with no HOCM.  There was mild LVOT gradient caused by upper septal hypertrophy but no SAM.  No LGE.  -LVH related to HTN and therefore needs aggressive BP control -continue amlodipine 10mg  daily, spiro 25mg  daily and Labetalol to 300mg  qPM (she cannot take meds in am due to dizziness and fatigue) -encouraged her to follow a low Na diet < 2gm daily>>she has been using table salt -I have asked her to check her BP twice daily for a week and call with results  2.  MR -mild by echo 2018  3.  Hx of atypical CP -negative nuclear stress test 2008 -denies any chest pain or SOB  COVID-19 Education: The signs and symptoms of COVID-19 were discussed with the patient and how to seek care for testing (follow up with PCP or arrange E-visit).  The importance of social distancing was discussed today.  Patient Risk:   After full review of this patient's clinical status, I feel that they are at least moderate risk at this time.  Time:   Today, I have spent 20 minutes on telemediicne discussing medical problems including HTN, MR, LVH and reviewing patient's chart including 2D echo.   Medication Adjustments/Labs and Tests Ordered: Current medicines are reviewed at length with the patient today.  Concerns regarding medicines are outlined above.  Medication changes, Labs and Tests ordered today are  listed in the Patient Instructions below.  Followup with me in 1 year   Signed, Fransico Him, MD  05/23/2020 2:42 PM    [facility] [place], [place],   [address] Phone: [phone]; Fax: [phone]

## 2020-05-23 NOTE — Patient Instructions (Signed)
Please check your blood pressure twice daily for one week and send [place] a message with all of your readings.   Medication Instructions: . Your physician recommends that you continue on your current medications as directed. Please refer to the Current Medication list given to you today.  *If you need a refill on your cardiac medications before your next appointment, please call your pharmacy.   Follow-Up: At [facility], you and your health needs are our priority.  As part of our continuing mission to provide you with exceptional heart care, we have created designated Provider Care Teams.  These Care Teams include your primary Cardiologist (physician) and Advanced Practice Providers (APPs -  Physician Assistants and Nurse Practitioners) who all work together to provide you with the care you need, when you need it.  Your next appointment:   1 year(s)  The format for your next appointment:   In Person  Provider:   You may see Fransico Him, MD or one of the following Advanced Practice Providers on your designated Care Team:    Melina Copa, PA-C  Ermalinda Barrios, PA-C

## 2020-05-26 ENCOUNTER — Other Ambulatory Visit: Payer: Self-pay

## 2020-05-26 ENCOUNTER — Ambulatory Visit (INDEPENDENT_AMBULATORY_CARE_PROVIDER_SITE_OTHER): Payer: Medicare Other | Admitting: *Deleted

## 2020-05-26 DIAGNOSIS — E538 Deficiency of other specified B group vitamins: Secondary | ICD-10-CM

## 2020-05-26 MED ORDER — CYANOCOBALAMIN 1000 MCG/ML IJ SOLN
1000.0000 ug | Freq: Once | INTRAMUSCULAR | Status: AC
  Administered 2020-05-26: 1000 ug via INTRAMUSCULAR

## 2020-05-26 NOTE — Progress Notes (Signed)
Pls cosign for B12 inj in absence of PCP../lmb   

## 2020-06-13 ENCOUNTER — Telehealth: Payer: Self-pay | Admitting: Internal Medicine

## 2020-06-13 ENCOUNTER — Other Ambulatory Visit: Payer: Self-pay | Admitting: Internal Medicine

## 2020-06-13 DIAGNOSIS — G4700 Insomnia, unspecified: Secondary | ICD-10-CM

## 2020-06-13 MED ORDER — ZOLPIDEM TARTRATE 10 MG PO TABS: ORAL_TABLET | ORAL | 1 refills | Status: DC

## 2020-06-13 NOTE — Telephone Encounter (Signed)
Lorrin Mais too soon - given 6 mo supply injune 2021

## 2020-06-13 NOTE — Telephone Encounter (Signed)
zolpidem (AMBIEN) 10 MG tablet  Patient would like to increase does to full pill, instead of half    [id number] - [place], [place] - [address] Phone:  [phone]  Fax:  [phone]     Last MD appt: 3.31.21 Next MD appt: not scheduled

## 2020-06-13 NOTE — Telephone Encounter (Signed)
Done erx 

## 2020-06-30 ENCOUNTER — Other Ambulatory Visit: Payer: Self-pay

## 2020-06-30 ENCOUNTER — Ambulatory Visit (INDEPENDENT_AMBULATORY_CARE_PROVIDER_SITE_OTHER): Payer: Medicare Other

## 2020-06-30 ENCOUNTER — Telehealth: Payer: Self-pay

## 2020-06-30 DIAGNOSIS — E538 Deficiency of other specified B group vitamins: Secondary | ICD-10-CM | POA: Diagnosis not present

## 2020-06-30 MED ORDER — CYANOCOBALAMIN 1000 MCG/ML IJ SOLN
1000.0000 ug | Freq: Once | INTRAMUSCULAR | Status: AC
  Administered 2020-06-30: 1000 ug via INTRAMUSCULAR

## 2020-06-30 NOTE — Telephone Encounter (Signed)
Pt was in office for a B12 shot. She stated that she is scheduled or a COVID booster this Thursday 07/03/20. She would like to know if it would be ok for her to get her high dose flu shot the same day? Please advise. She has been informed that PCP is out of the office this week and message will be forwarded to another provider.

## 2020-06-30 NOTE — Telephone Encounter (Signed)
I am still recommending that patient's separate their COVID and flu shots by 2 weeks.

## 2020-06-30 NOTE — Telephone Encounter (Signed)
Attempted to call pt multiple times. Every time pt would answer the call would drop.

## 2020-07-01 ENCOUNTER — Encounter: Payer: Self-pay | Admitting: Internal Medicine

## 2020-07-01 NOTE — Progress Notes (Addendum)
b12 given. Please cosign.  Medical screening examination/treatment/procedure(s) were performed by non-physician practitioner and as supervising physician I was immediately available for consultation/collaboration.  I agree with above. Lew Dawes, MD

## 2020-07-26 ENCOUNTER — Other Ambulatory Visit: Payer: Self-pay | Admitting: Internal Medicine

## 2020-07-26 DIAGNOSIS — I10 Essential (primary) hypertension: Secondary | ICD-10-CM

## 2020-07-27 NOTE — Telephone Encounter (Signed)
Please refill as per office routine med refill policy (all routine meds refilled for 3 mo or monthly per pt preference up to one year from last visit, then month to month grace period for 3 mo, then further med refills will have to be denied)  

## 2020-08-04 ENCOUNTER — Ambulatory Visit (INDEPENDENT_AMBULATORY_CARE_PROVIDER_SITE_OTHER): Payer: Medicare Other | Admitting: *Deleted

## 2020-08-04 ENCOUNTER — Other Ambulatory Visit: Payer: Self-pay

## 2020-08-04 DIAGNOSIS — Z23 Encounter for immunization: Secondary | ICD-10-CM

## 2020-08-04 DIAGNOSIS — E538 Deficiency of other specified B group vitamins: Secondary | ICD-10-CM | POA: Diagnosis not present

## 2020-08-04 MED ORDER — CYANOCOBALAMIN 1000 MCG/ML IJ SOLN
1000.0000 ug | Freq: Once | INTRAMUSCULAR | Status: AC
  Administered 2020-08-04: 1000 ug via INTRAMUSCULAR

## 2020-08-04 NOTE — Progress Notes (Signed)
Pls cosign for B12 inj../lmb  

## 2020-08-07 ENCOUNTER — Ambulatory Visit: Payer: Medicare Other | Attending: Internal Medicine

## 2020-08-07 DIAGNOSIS — Z23 Encounter for immunization: Secondary | ICD-10-CM

## 2020-08-07 NOTE — Progress Notes (Signed)
   Covid-19 Vaccination Clinic  Name:  Laura Poole    MRN: [id number] DOB: 10-Jun-1951  08/07/2020  [name] was observed post Covid-19 immunization for 15 minutes without incident. She was provided with Vaccine Information Sheet and instruction to access the V-Safe system.   [name] was instructed to call [phone] with any severe reactions post vaccine: [name] Difficulty breathing  . Swelling of face and throat  . A fast heartbeat  . A bad rash all over body  . Dizziness and weakness

## 2020-08-09 ENCOUNTER — Ambulatory Visit: Payer: Medicare Other

## 2020-08-15 ENCOUNTER — Other Ambulatory Visit: Payer: Self-pay | Admitting: Obstetrics and Gynecology

## 2020-08-15 DIAGNOSIS — Z1231 Encounter for screening mammogram for malignant neoplasm of breast: Secondary | ICD-10-CM

## 2020-08-16 ENCOUNTER — Other Ambulatory Visit: Payer: Self-pay | Admitting: Internal Medicine

## 2020-09-08 ENCOUNTER — Other Ambulatory Visit: Payer: Self-pay | Admitting: Internal Medicine

## 2020-09-08 ENCOUNTER — Other Ambulatory Visit: Payer: Self-pay

## 2020-09-08 ENCOUNTER — Ambulatory Visit (INDEPENDENT_AMBULATORY_CARE_PROVIDER_SITE_OTHER): Payer: Medicare Other

## 2020-09-08 DIAGNOSIS — I10 Essential (primary) hypertension: Secondary | ICD-10-CM

## 2020-09-08 DIAGNOSIS — E538 Deficiency of other specified B group vitamins: Secondary | ICD-10-CM | POA: Diagnosis not present

## 2020-09-08 MED ORDER — CYANOCOBALAMIN 1000 MCG/ML IJ SOLN
1000.0000 ug | Freq: Once | INTRAMUSCULAR | Status: AC
  Administered 2020-09-08: 1000 ug via INTRAMUSCULAR

## 2020-09-08 NOTE — Progress Notes (Signed)
B12 given and tolerated well °

## 2020-09-10 ENCOUNTER — Telehealth: Payer: Self-pay | Admitting: Internal Medicine

## 2020-09-10 NOTE — Telephone Encounter (Signed)
Patient wondering if she can get a urine sample done before her appointment on 12.03.21 because she is still seeing blood in her urine.  [phone]

## 2020-09-10 NOTE — Telephone Encounter (Signed)
Sent to Dr. John. 

## 2020-09-10 NOTE — Telephone Encounter (Signed)
Sorry no bc medicare does not pay for labs done prior to visit

## 2020-09-12 ENCOUNTER — Ambulatory Visit: Payer: Medicare Other | Admitting: Internal Medicine

## 2020-09-15 ENCOUNTER — Other Ambulatory Visit: Payer: Self-pay | Admitting: Internal Medicine

## 2020-09-15 ENCOUNTER — Other Ambulatory Visit: Payer: Self-pay

## 2020-09-15 ENCOUNTER — Other Ambulatory Visit (INDEPENDENT_AMBULATORY_CARE_PROVIDER_SITE_OTHER): Payer: Medicare Other

## 2020-09-15 ENCOUNTER — Ambulatory Visit (INDEPENDENT_AMBULATORY_CARE_PROVIDER_SITE_OTHER): Payer: Medicare Other | Admitting: Internal Medicine

## 2020-09-15 ENCOUNTER — Encounter: Payer: Self-pay | Admitting: Internal Medicine

## 2020-09-15 VITALS — BP 160/100 | HR 85 | Temp 98.5°F | Ht 67.5 in | Wt 183.0 lb

## 2020-09-15 DIAGNOSIS — M545 Low back pain, unspecified: Secondary | ICD-10-CM

## 2020-09-15 DIAGNOSIS — R350 Frequency of micturition: Secondary | ICD-10-CM | POA: Diagnosis not present

## 2020-09-15 DIAGNOSIS — I6523 Occlusion and stenosis of bilateral carotid arteries: Secondary | ICD-10-CM

## 2020-09-15 DIAGNOSIS — I10 Essential (primary) hypertension: Secondary | ICD-10-CM

## 2020-09-15 DIAGNOSIS — R399 Unspecified symptoms and signs involving the genitourinary system: Secondary | ICD-10-CM

## 2020-09-15 DIAGNOSIS — G8929 Other chronic pain: Secondary | ICD-10-CM

## 2020-09-15 MED ORDER — DULOXETINE HCL 60 MG PO CPEP: 60.0000 mg | ORAL_CAPSULE | Freq: Every day | ORAL | 3 refills | Status: DC

## 2020-09-15 MED ORDER — MELOXICAM 7.5 MG PO TABS
7.5000 mg | ORAL_TABLET | Freq: Every day | ORAL | 3 refills | Status: DC
Start: 2020-09-15 — End: 2020-12-01

## 2020-09-15 MED ORDER — CIPROFLOXACIN HCL 500 MG PO TABS: 500.0000 mg | ORAL_TABLET | Freq: Two times a day (BID) | ORAL | 0 refills | Status: DC

## 2020-09-15 MED ORDER — LOSARTAN POTASSIUM 50 MG PO TABS: 50.0000 mg | ORAL_TABLET | Freq: Every day | ORAL | 3 refills | Status: DC

## 2020-09-15 NOTE — Assessment & Plan Note (Signed)
Uncontrolled, not taking the labetolol due to dizziness, to add losartan 50 qd

## 2020-09-15 NOTE — Patient Instructions (Signed)
Please take all new medication as prescribed - the antibiotic  Please take all new medication as prescribed - the losartan for blood pressure  Please check your BP once per day for 10 days AFTER about 5 days of starting the losartan, and call with the average (add the top numbers and divide by 10)  Please continue all other medications as before, and refills have been done if requested - the cymbalta and mobic  Please have the pharmacy call with any other refills you may need.  Please continue your efforts at being more active, low cholesterol diet, and weight control.  Please keep your appointments with your specialists as you may have planned  Please go to the LAB at the blood drawing area for the tests to be done - just the urine testing at [place] will be contacted by phone if any changes need to be made immediately.  Otherwise, you will receive a letter about your results with an explanation, but please check with MyChart first.  Please remember to sign up for MyChart if you have not done so, as this will be important to you in the future with finding out test results, communicating by private email, and scheduling acute appointments online when needed.

## 2020-09-15 NOTE — Progress Notes (Signed)
Subjective:    Patient ID: Laura Poole, female    DOB: 11/27/1950, 69 y.o.   MRN: [id number]  HPI  Here to f/u; overall doing ok,  Pt denies chest pain, increasing sob or doe, wheezing, orthopnea, PND, increased LE swelling, palpitations, dizziness or syncope.  Pt denies new neurological symptoms such as new headache, or facial or extremity weakness or numbness.  Pt denies polydipsia, polyuria, or low sugar episode.  Pt states overall good compliance with meds, Also with 5 days onset urinary freq and discomfort with initial hematuria, low abd and back pain without n/v or flank pain or fever.  Also ran out of cymbalta and mobic, needs refill for pain Past Medical History:  Diagnosis Date  . Bilateral leg cramps 09/01/2015  . Carotid stenosis 06/23/2014  . Chest pain   . Essential hypertension 01/07/2010   Qualifier: Diagnosis of  By: Linna Darner MD, Gwyndolyn Saxon    . GERD (gastroesophageal reflux disease)   . H/O: hysterectomy   . Heart murmur 03/30/2017  . History of breast cancer 2006  . History of colonic polyps   . HLD (hyperlipidemia)   . HTN (hypertension)   . Mitral regurgitation 08/05/2015  . Personal history of chemotherapy 2006   Left Breast Cancer  . Personal history of radiation therapy 2006   Left Breast Cancer  . Snoring 03/30/2017   Past Surgical History:  Procedure Laterality Date  . ABDOMINAL HYSTERECTOMY    . BREAST LUMPECTOMY Left 2006  . COLONOSCOPY W/ POLYPECTOMY  2004, 2009   Dr Watt Climes  . TUBAL LIGATION      reports that she has never smoked. She has never used smokeless tobacco. She reports current alcohol use of about 2.0 standard drinks of alcohol per week. She reports that she does not use drugs. family history includes Aneurysm in her brother; Asthma in an other family member; Breast cancer in her paternal grandmother; Coronary artery disease in her mother; Heart attack (age of onset: 81) in her father; Hyperlipidemia in an other family member; Hypertension in her  mother; Sudden death in an other family member. No Known Allergies Current Outpatient Medications on File Prior to Visit  Medication Sig Dispense Refill  . acetaminophen (TYLENOL) 500 MG tablet Take 500 mg by mouth every 6 (six) hours as needed for mild pain or headache.    [name] amLODipine (NORVASC) 10 MG tablet Take 1 tablet (10 mg total) by mouth daily. 90 tablet 3  . aspirin 81 MG tablet Take 1 tablet (81 mg total) by mouth daily. 30 tablet 6  . diphenhydrAMINE (BENADRYL) 25 mg capsule Take 25 mg by mouth every 6 (six) hours as needed for allergies.    . famotidine (PEPCID AC) 10 MG tablet Take 1 tablet (10 mg total) by mouth 2 (two) times daily. 60 tablet 1  . fexofenadine-pseudoephedrine (ALLEGRA-D 24) 180-240 MG 24 hr tablet Take 1 tablet by mouth daily.    [name] gabapentin (NEURONTIN) 100 MG capsule Take 2 capsules (200 mg total) by mouth at bedtime. 180 capsule 1  . rosuvastatin (CRESTOR) 10 MG tablet Take 1 tablet (10 mg total) by mouth daily. 90 tablet 1  . spironolactone (ALDACTONE) 25 MG tablet TAKE 1 TABLET BY MOUTH TWICE A DAY 90 tablet 1  . tiZANidine (ZANAFLEX) 2 MG tablet TAKE 1 TABLET EVERY 6 HOURS AS NEEDED FOR MUSCLE SPASMS 360 tablet 3  . traMADol (ULTRAM) 50 MG tablet Take 1 tablet (50 mg total) by mouth every 6 (six) hours as  needed. 120 tablet 2  . triamcinolone (NASACORT) 55 MCG/ACT AERO nasal inhaler Place 2 sprays into the nose daily. 1 Inhaler 12  . vitamin C (ASCORBIC ACID) 500 MG tablet Take 500 mg by mouth daily.    [name] zolpidem (AMBIEN) 10 MG tablet TAKE ONE TABLET AT BEDTIME AS NEEDED FOR SLEEP 90 tablet 1   No current facility-administered medications on file prior to visit.   Review of Systems All otherwise neg per pt    Objective:   Physical Exam BP (!) 160/100 (BP Location: Left Arm, Patient Position: Sitting, Cuff Size: Large)   Pulse 85   Temp 98.5 F (36.9 C) (Oral)   Ht 5' 7.5" (1.715 m)   Wt 183 lb (83 kg)   SpO2 98%   BMI 28.24 kg/m  VS noted,    Constitutional: Pt appears in NAD HENT: Head: NCAT.  Right Ear: External ear normal.  Left Ear: External ear normal.  Eyes: . Pupils are equal, round, and reactive to light. Conjunctivae and EOM are normal Nose: without d/c or deformity Neck: Neck supple. Gross normal ROM Cardiovascular: Normal rate and regular rhythm.   Pulmonary/Chest: Effort normal and breath sounds without rales or wheezing.  Abd:  Soft, tender low mid abd, ND, + BS, no organomegaly Neurological: Pt is alert. At baseline orientation, motor grossly intact Skin: Skin is warm. No rashes, other new lesions, no LE edema Psychiatric: Pt behavior is normal without agitation  All otherwise neg per pt Lab Results  Component Value Date   WBC 6.1 01/09/2020   HGB 12.4 01/09/2020   HCT 38.5 01/09/2020   PLT 315.0 01/09/2020   GLUCOSE 100 (H) 01/09/2020   CHOL 168 01/09/2020   TRIG 80.0 01/09/2020   HDL 55.90 01/09/2020   LDLCALC 96 01/09/2020   ALT 21 01/09/2020   AST 18 01/09/2020   NA 140 01/09/2020   K 3.6 01/09/2020   CL 102 01/09/2020   CREATININE 0.79 01/09/2020   BUN 12 01/09/2020   CO2 31 01/09/2020   TSH 1.93 01/09/2020   INR 1.1 (H) 01/25/2012   HGBA1C 6.5 03/19/2019      Assessment & Plan:

## 2020-09-15 NOTE — Assessment & Plan Note (Signed)
And other myofascial pain as before, for reestart cymbalta and mobic

## 2020-09-15 NOTE — Assessment & Plan Note (Addendum)
Likely uti, for urine studies, empriic abx  to f/u any worsening symptoms or concerns  I spent 31 minutes in preparing to see the patient by review of recent labs, imaging and procedures, obtaining and reviewing separately obtained history, communicating with the patient and family or caregiver, ordering medications, tests or procedures, and documenting clinical information in the EHR including the differential Dx, treatment, and any further evaluation and other management of urinary frequency, htn, lbp

## 2020-09-16 ENCOUNTER — Encounter: Payer: Self-pay | Admitting: Internal Medicine

## 2020-09-16 LAB — URINALYSIS, ROUTINE W REFLEX MICROSCOPIC
Bilirubin Urine: NEGATIVE
Ketones, ur: NEGATIVE
Leukocytes,Ua: NEGATIVE
Nitrite: NEGATIVE
Specific Gravity, Urine: 1.02 (ref 1.000–1.030)
Total Protein, Urine: NEGATIVE
Urine Glucose: NEGATIVE
Urobilinogen, UA: 0.2 (ref 0.0–1.0)
pH: 7.5 (ref 5.0–8.0)

## 2020-09-16 LAB — URINE CULTURE
MICRO NUMBER:: 11280933
Result:: NO GROWTH
SPECIMEN QUALITY:: ADEQUATE

## 2020-09-17 ENCOUNTER — Encounter: Payer: Self-pay | Admitting: Internal Medicine

## 2020-09-22 NOTE — Telephone Encounter (Addendum)
   Patient calling to report DULoxetine (CYMBALTA) 60 MG capsule or   losartan (COZAAR) 50 MG tabletis causing dizziness and nausea  Please advise

## 2020-09-23 NOTE — Telephone Encounter (Signed)
I am not sure how to answer except to possibly suggest another visit, as there are many causes and sometime we cannot even figure it out for some patients

## 2020-09-24 NOTE — Telephone Encounter (Signed)
Need OV as I am not sure how to proceed in this case

## 2020-09-29 ENCOUNTER — Other Ambulatory Visit: Payer: Self-pay

## 2020-09-29 ENCOUNTER — Ambulatory Visit: Payer: Medicare Other | Admitting: Internal Medicine

## 2020-09-29 ENCOUNTER — Ambulatory Visit
Admission: RE | Admit: 2020-09-29 | Discharge: 2020-09-29 | Disposition: A | Discharge status: Home / Self Care | Payer: Medicare Other | Source: Ambulatory Visit | Attending: Obstetrics and Gynecology | Admitting: Obstetrics and Gynecology

## 2020-09-29 DIAGNOSIS — Z1231 Encounter for screening mammogram for malignant neoplasm of breast: Secondary | ICD-10-CM

## 2020-10-06 ENCOUNTER — Ambulatory Visit (INDEPENDENT_AMBULATORY_CARE_PROVIDER_SITE_OTHER): Payer: Medicare Other

## 2020-10-06 ENCOUNTER — Other Ambulatory Visit: Payer: Self-pay

## 2020-10-06 DIAGNOSIS — E538 Deficiency of other specified B group vitamins: Secondary | ICD-10-CM

## 2020-10-06 MED ORDER — CYANOCOBALAMIN 1000 MCG/ML IJ SOLN
1000.0000 ug | Freq: Once | INTRAMUSCULAR | Status: AC
  Administered 2020-10-06: 1000 ug via INTRAMUSCULAR

## 2020-10-06 NOTE — Progress Notes (Signed)
Pt given B12 injection w/o any complications. 

## 2020-10-14 DIAGNOSIS — I499 Cardiac arrhythmia, unspecified: Secondary | ICD-10-CM | POA: Insufficient documentation

## 2020-10-14 DIAGNOSIS — C50919 Malignant neoplasm of unspecified site of unspecified female breast: Secondary | ICD-10-CM | POA: Insufficient documentation

## 2020-11-10 ENCOUNTER — Other Ambulatory Visit: Payer: Self-pay

## 2020-11-10 ENCOUNTER — Ambulatory Visit: Payer: Medicare Other

## 2020-11-10 ENCOUNTER — Telehealth: Payer: Self-pay

## 2020-11-10 ENCOUNTER — Ambulatory Visit (INDEPENDENT_AMBULATORY_CARE_PROVIDER_SITE_OTHER): Payer: Medicare Other

## 2020-11-10 DIAGNOSIS — E538 Deficiency of other specified B group vitamins: Secondary | ICD-10-CM | POA: Diagnosis not present

## 2020-11-10 MED ORDER — CYANOCOBALAMIN 1000 MCG/ML IJ SOLN
1000.0000 ug | Freq: Once | INTRAMUSCULAR | Status: AC
  Administered 2020-11-10: 1000 ug via INTRAMUSCULAR

## 2020-11-10 NOTE — Telephone Encounter (Signed)
Last OV on 01/09/20, stated "for replacement"; last b12 lab 01/09/20 Order needs clarification. Pt to see PCP on 12/01/20.

## 2020-11-10 NOTE — Telephone Encounter (Signed)
Ok to hold b12 injection until next visit

## 2020-11-10 NOTE — Progress Notes (Signed)
Pt here for B12 injection per Dr Jenny Reichmann.  B12 10101mcg given IM right deltoid and pt tolerated injection well.  Next B12 injection scheduled for 12/08/20.

## 2020-11-28 ENCOUNTER — Other Ambulatory Visit: Payer: Self-pay

## 2020-12-01 ENCOUNTER — Encounter: Payer: Self-pay | Admitting: Internal Medicine

## 2020-12-01 ENCOUNTER — Ambulatory Visit (INDEPENDENT_AMBULATORY_CARE_PROVIDER_SITE_OTHER): Payer: Medicare Other | Admitting: Internal Medicine

## 2020-12-01 ENCOUNTER — Other Ambulatory Visit: Payer: Self-pay

## 2020-12-01 VITALS — BP 144/80 | HR 81 | Temp 98.5°F | Ht 67.5 in | Wt 183.0 lb

## 2020-12-01 DIAGNOSIS — I10 Essential (primary) hypertension: Secondary | ICD-10-CM

## 2020-12-01 DIAGNOSIS — R350 Frequency of micturition: Secondary | ICD-10-CM | POA: Diagnosis not present

## 2020-12-01 DIAGNOSIS — Z0001 Encounter for general adult medical examination with abnormal findings: Secondary | ICD-10-CM

## 2020-12-01 DIAGNOSIS — E559 Vitamin D deficiency, unspecified: Secondary | ICD-10-CM

## 2020-12-01 DIAGNOSIS — K219 Gastro-esophageal reflux disease without esophagitis: Secondary | ICD-10-CM

## 2020-12-01 DIAGNOSIS — G4700 Insomnia, unspecified: Secondary | ICD-10-CM

## 2020-12-01 DIAGNOSIS — I6523 Occlusion and stenosis of bilateral carotid arteries: Secondary | ICD-10-CM

## 2020-12-01 DIAGNOSIS — E538 Deficiency of other specified B group vitamins: Secondary | ICD-10-CM

## 2020-12-01 DIAGNOSIS — F32A Depression, unspecified: Secondary | ICD-10-CM

## 2020-12-01 DIAGNOSIS — E7800 Pure hypercholesterolemia, unspecified: Secondary | ICD-10-CM | POA: Diagnosis not present

## 2020-12-01 DIAGNOSIS — R310 Gross hematuria: Secondary | ICD-10-CM | POA: Diagnosis not present

## 2020-12-01 DIAGNOSIS — E785 Hyperlipidemia, unspecified: Secondary | ICD-10-CM

## 2020-12-01 DIAGNOSIS — F419 Anxiety disorder, unspecified: Secondary | ICD-10-CM

## 2020-12-01 LAB — URINALYSIS, ROUTINE W REFLEX MICROSCOPIC
Bilirubin Urine: NEGATIVE
Ketones, ur: NEGATIVE
Leukocytes,Ua: NEGATIVE
Nitrite: NEGATIVE
RBC / HPF: NONE SEEN (ref 0–?)
Specific Gravity, Urine: 1.015 (ref 1.000–1.030)
Total Protein, Urine: NEGATIVE
Urine Glucose: NEGATIVE
Urobilinogen, UA: 0.2 (ref 0.0–1.0)
pH: 7.5 (ref 5.0–8.0)

## 2020-12-01 LAB — HEPATIC FUNCTION PANEL
ALT: 20 U/L (ref 0–35)
AST: 16 U/L (ref 0–37)
Albumin: 4.3 g/dL (ref 3.5–5.2)
Alkaline Phosphatase: 67 U/L (ref 39–117)
Bilirubin, Direct: 0.1 mg/dL (ref 0.0–0.3)
Total Bilirubin: 0.4 mg/dL (ref 0.2–1.2)
Total Protein: 7.3 g/dL (ref 6.0–8.3)

## 2020-12-01 LAB — CBC WITH DIFFERENTIAL/PLATELET
Basophils Absolute: 0 10*3/uL (ref 0.0–0.1)
Basophils Relative: 0.7 % (ref 0.0–3.0)
Eosinophils Absolute: 0.2 10*3/uL (ref 0.0–0.7)
Eosinophils Relative: 2.1 % (ref 0.0–5.0)
HCT: 39.9 % (ref 36.0–46.0)
Hemoglobin: 13 g/dL (ref 12.0–15.0)
Lymphocytes Relative: 26.8 % (ref 12.0–46.0)
Lymphs Abs: 1.9 10*3/uL (ref 0.7–4.0)
MCHC: 32.5 g/dL (ref 30.0–36.0)
MCV: 89.6 fl (ref 78.0–100.0)
Monocytes Absolute: 0.5 10*3/uL (ref 0.1–1.0)
Monocytes Relative: 7.2 % (ref 3.0–12.0)
Neutro Abs: 4.6 10*3/uL (ref 1.4–7.7)
Neutrophils Relative %: 63.2 % (ref 43.0–77.0)
Platelets: 314 10*3/uL (ref 150.0–400.0)
RBC: 4.45 Mil/uL (ref 3.87–5.11)
RDW: 15.8 % — ABNORMAL HIGH (ref 11.5–15.5)
WBC: 7.2 10*3/uL (ref 4.0–10.5)

## 2020-12-01 LAB — BASIC METABOLIC PANEL
BUN: 13 mg/dL (ref 6–23)
CO2: 31 mEq/L (ref 19–32)
Calcium: 9.5 mg/dL (ref 8.4–10.5)
Chloride: 102 mEq/L (ref 96–112)
Creatinine, Ser: 0.84 mg/dL (ref 0.40–1.20)
GFR: 71.8 mL/min (ref >60.00)
Glucose, Bld: 95 mg/dL (ref 70–99)
Potassium: 4.6 mEq/L (ref 3.5–5.1)
Sodium: 141 mEq/L (ref 135–145)

## 2020-12-01 LAB — TSH: TSH: 1.6 u[IU]/mL (ref 0.35–4.50)

## 2020-12-01 LAB — LIPID PANEL
Cholesterol: 175 mg/dL (ref 0–200)
HDL: 66.1 mg/dL (ref >39.00)
LDL Cholesterol: 85 mg/dL (ref 0–99)
NonHDL: 108.96
Total CHOL/HDL Ratio: 3
Triglycerides: 120 mg/dL (ref 0.0–149.0)
VLDL: 24 mg/dL (ref 0.0–40.0)

## 2020-12-01 LAB — VITAMIN D 25 HYDROXY (VIT D DEFICIENCY, FRACTURES): VITD: 38.96 ng/mL (ref 30.00–100.00)

## 2020-12-01 MED ORDER — GABAPENTIN 100 MG PO CAPS: 200.0000 mg | ORAL_CAPSULE | Freq: Every day | ORAL | 1 refills | Status: DC

## 2020-12-01 MED ORDER — SPIRONOLACTONE 25 MG PO TABS: 25.0000 mg | ORAL_TABLET | Freq: Two times a day (BID) | ORAL | 3 refills | Status: DC

## 2020-12-01 MED ORDER — SOLIFENACIN SUCCINATE 5 MG PO TABS: 5.0000 mg | ORAL_TABLET | Freq: Every day | ORAL | 3 refills | Status: DC

## 2020-12-01 MED ORDER — ZOLPIDEM TARTRATE 10 MG PO TABS: ORAL_TABLET | ORAL | 1 refills | Status: DC

## 2020-12-01 MED ORDER — PANTOPRAZOLE SODIUM 40 MG PO TBEC: 40.0000 mg | DELAYED_RELEASE_TABLET | Freq: Every day | ORAL | 3 refills | Status: DC

## 2020-12-01 MED ORDER — DULOXETINE HCL 60 MG PO CPEP: 60.0000 mg | ORAL_CAPSULE | Freq: Every day | ORAL | 3 refills | Status: DC

## 2020-12-01 MED ORDER — ROSUVASTATIN CALCIUM 10 MG PO TABS: 10.0000 mg | ORAL_TABLET | Freq: Every day | ORAL | 3 refills | Status: DC

## 2020-12-01 MED ORDER — LOSARTAN POTASSIUM 50 MG PO TABS: 50.0000 mg | ORAL_TABLET | Freq: Every day | ORAL | 3 refills | Status: DC

## 2020-12-01 MED ORDER — AMLODIPINE BESYLATE 10 MG PO TABS: 10.0000 mg | ORAL_TABLET | Freq: Every day | ORAL | 3 refills | Status: DC

## 2020-12-01 MED ORDER — MELOXICAM 7.5 MG PO TABS: 7.5000 mg | ORAL_TABLET | Freq: Every day | ORAL | 3 refills | Status: DC

## 2020-12-01 MED ORDER — TIZANIDINE HCL 2 MG PO TABS
ORAL_TABLET | ORAL | 3 refills | Status: DC
Start: 2020-12-01 — End: 2023-06-30

## 2020-12-01 NOTE — Assessment & Plan Note (Signed)
Lab Results  Component Value Date   VITAMINB12 1,266 (H) 01/09/2020   Stable, cont oral replacement - b12 1000 mcg qd

## 2020-12-01 NOTE — Assessment & Plan Note (Signed)
Etiology unclear, pt not concerned, declines urology referral

## 2020-12-01 NOTE — Progress Notes (Addendum)
Patient ID: Laura Poole, female   DOB: 1950-11-24, 70 y.o.   MRN: [id number]         Chief Complaint:: wellness exam and gerd, urinary frequency, gross hematuria, and elevated BP       HPI:  Laura Poole is a 70 y.o. female here for wellness exam, up to date with preventive referrals and immunizations                        Also c/o marked incresaed reflux now not well controled with pepcid and anti reflux precautions; Denies worsening other abd pain, dysphagia, n/v, bowel change or blood.  Also has > 6 mo of urinary frequency and urgency random, frequent, has to know where all the BRs are, but Denies urinary symptoms such as dysuria, flank pain, or n/v, fever, chills.  Did have an episode x 1 of gross hematuria a few months ago.  Pt denies chest pain, increased sob or doe, wheezing, orthopnea, PND, increased LE swelling, palpitations, dizziness or syncope.  Denies focal neuro s/s.   Pt denies polydipsia, polyuria,  Bp check at home about qod with ave sbp 120s.  Good med compliance.     Wt Readings from Last 3 Encounters:  12/01/20 183 lb (83 kg)  09/15/20 183 lb (83 kg)  05/23/20 179 lb (81.2 kg)   BP Readings from Last 3 Encounters:  12/01/20 (!) 144/80  09/15/20 (!) 160/100  05/23/20 (!) 145/95   Immunization History  Administered Date(s) Administered  . Fluad Quad(high Dose 65+) 06/15/2019, 08/04/2020  . Influenza Split 08/09/2012  . Influenza, High Dose Seasonal PF 07/29/2017, 06/27/2018  . Influenza-Unspecified 07/11/2013  . Moderna SARS-COV2 Booster Vaccination 08/07/2020  . Moderna Sars-Covid-2 Vaccination 10/26/2019, 11/23/2019  . Pneumococcal Conjugate-13 06/27/2018  . Pneumococcal Polysaccharide-23 01/09/2020  . Tetanus 12/04/2012  . Zoster 05/20/2016  There are no preventive care reminders to display for this patient.    Past Medical History:  Diagnosis Date  . Bilateral leg cramps 09/01/2015  . Carotid stenosis 06/23/2014  . Chest pain   . Essential  hypertension 01/07/2010   Qualifier: Diagnosis of  By: Linna Darner MD, Gwyndolyn Saxon    . GERD (gastroesophageal reflux disease)   . H/O: hysterectomy   . Heart murmur 03/30/2017  . History of breast cancer 2006  . History of colonic polyps   . HLD (hyperlipidemia)   . HTN (hypertension)   . Mitral regurgitation 08/05/2015  . Personal history of chemotherapy 2006   Left Breast Cancer  . Personal history of radiation therapy 2006   Left Breast Cancer  . Snoring 03/30/2017   Past Surgical History:  Procedure Laterality Date  . ABDOMINAL HYSTERECTOMY    . BREAST LUMPECTOMY Left 2006  . COLONOSCOPY W/ POLYPECTOMY  2004, 2009   Dr Watt Climes  . TUBAL LIGATION      reports that she has never smoked. She has never used smokeless tobacco. She reports current alcohol use of about 2.0 standard drinks of alcohol per week. She reports that she does not use drugs. family history includes Aneurysm in her brother; Asthma in an other family member; Breast cancer in her paternal grandmother; Coronary artery disease in her mother; Heart attack (age of onset: 31) in her father; Hyperlipidemia in an other family member; Hypertension in her mother; Sudden death in an other family member. No Known Allergies Current Outpatient Medications on File Prior to Visit  Medication Sig Dispense Refill  . acetaminophen (TYLENOL) 500 MG tablet Take  500 mg by mouth every 6 (six) hours as needed for mild pain or headache.    [name] aspirin 81 MG tablet Take 1 tablet (81 mg total) by mouth daily. 30 tablet 6  . diphenhydrAMINE (BENADRYL) 25 mg capsule Take 25 mg by mouth every 6 (six) hours as needed for allergies.    . fexofenadine (ALLEGRA) 180 MG tablet fexofenadine 180 mg tablet    . traMADol (ULTRAM) 50 MG tablet Take 1 tablet (50 mg total) by mouth every 6 (six) hours as needed. 120 tablet 2  . triamcinolone (NASACORT) 55 MCG/ACT AERO nasal inhaler Place 2 sprays into the nose daily. 1 Inhaler 12  . vitamin C (ASCORBIC ACID) 500 MG  tablet Take 500 mg by mouth daily.     No current facility-administered medications on file prior to visit.        ROS:  All others reviewed and negative.  Objective        PE:  BP (!) 144/80   Pulse 81   Temp 98.5 F (36.9 C) (Oral)   Ht 5' 7.5" (1.715 m)   Wt 183 lb (83 kg)   SpO2 98%   BMI 28.24 kg/m                 Constitutional: Pt appears in NAD               HENT: Head: NCAT.                Right Ear: External ear normal.                 Left Ear: External ear normal.                Eyes: . Pupils are equal, round, and reactive to light. Conjunctivae and EOM are normal               Nose: without d/c or deformity               Neck: Neck supple. Gross normal ROM               Cardiovascular: Normal rate and regular rhythm.                 Pulmonary/Chest: Effort normal and breath sounds without rales or wheezing.                Abd:  Soft, NT, ND, + BS, no organomegaly               Neurological: Pt is alert. At baseline orientation, motor grossly intact               Skin: Skin is warm. No rashes, no other new lesions, LE edema - none               Psychiatric: Pt behavior is normal without agitation   Micro: none  Cardiac tracings I have personally interpreted today:  ECG - NSR - non specific changes  Pertinent Radiological findings (summarize): none   Lab Results  Component Value Date   WBC 7.2 12/01/2020   HGB 13.0 12/01/2020   HCT 39.9 12/01/2020   PLT 314.0 12/01/2020   GLUCOSE 95 12/01/2020   CHOL 175 12/01/2020   TRIG 120.0 12/01/2020   HDL 66.10 12/01/2020   LDLCALC 85 12/01/2020   ALT 20 12/01/2020   AST 16 12/01/2020   NA 141 12/01/2020   K 4.6 12/01/2020   CL 102 12/01/2020  CREATININE 0.84 12/01/2020   BUN 13 12/01/2020   CO2 31 12/01/2020   TSH 1.60 12/01/2020   INR 1.1 (H) 01/25/2012   HGBA1C 6.5 03/19/2019   Assessment/Plan:  Laura Poole is a 70 y.o. Black or African American [2] female with  has a past medical history of  Bilateral leg cramps (09/01/2015), Carotid stenosis (06/23/2014), Chest pain, Essential hypertension (01/07/2010), GERD (gastroesophageal reflux disease), H/O: hysterectomy, Heart murmur (03/30/2017), History of breast cancer (2006), History of colonic polyps, HLD (hyperlipidemia), HTN (hypertension), Mitral regurgitation (08/05/2015), Personal history of chemotherapy (2006), Personal history of radiation therapy (2006), and Snoring (03/30/2017).  Encounter for well adult exam with abnormal findings Age and sex appropriate education and counseling updated with regular exercise and diet Referrals for preventative services - none needed Immunizations addressed - none needed Smoking counseling  - none needed Evidence for depression or other mood disorder - none significant Most recent labs reviewed. I have personally reviewed and have noted: 1) the patient's medical and social history 2) The patient's current medications and supplements 3) The patient's height, weight, and BMI have been recorded in the chart   Anxiety and depression Stable, cont current med tx - cymbalta '  B12 deficiency Lab Results  Component Value Date   VITAMINB12 1,266 (H) 01/09/2020   Stable, cont oral replacement - b12 1000 mcg qd   GERD Uncontrolled, to change the pepcid to protonix 40 qd  Gross hematuria Etiology unclear, pt not concerned, declines urology referral  Hyperlipidemia Lab Results  Component Value Date   [place] 85 12/01/2020   Stable, pt to continue current statin  - crestor 10   Uncontrolled hypertension Pt states BP well controlled at home, declines any med change  Urinary frequency C/w prob OAB -  For urine cx, vesicare 5 qd, declines urology for now  Vitamin D deficiency Last vitamin D Lab Results  Component Value Date   VD25OH 38.96 12/01/2020   Stable, cont oral replacement  Followup: Return in about 6 months (around 05/31/2021).  Cathlean Cower, MD 12/01/2020 9:40 PM [facility]

## 2020-12-01 NOTE — Assessment & Plan Note (Signed)
Pt states BP well controlled at home, declines any med change

## 2020-12-01 NOTE — Assessment & Plan Note (Signed)
Stable, cont current med tx - cymbalta '

## 2020-12-01 NOTE — Patient Instructions (Signed)
Ok to change the pepcid to protonix 40 mg per day  Please take all new medication as prescribed - the vesicare 5 mg per day  Please call if you change your mind about seeing the urology  Please continue all other medications as before, and refills have been done if requested.  Please have the pharmacy call with any other refills you may need.  Please continue your efforts at being more active, low cholesterol diet, and weight control.  You are otherwise up to date with prevention measures today.  Please keep your appointments with your specialists as you may have planned  Please go to the LAB at the blood drawing area for the tests to be done  You will be contacted by phone if any changes need to be made immediately.  Otherwise, you will receive a letter about your results with an explanation, but please check with MyChart first.  Please remember to sign up for MyChart if you have not done so, as this will be important to you in the future with finding out test results, communicating by private email, and scheduling acute appointments online when needed.  Please make an Appointment to return in 6 months, or sooner if needed

## 2020-12-01 NOTE — Assessment & Plan Note (Signed)
Last vitamin D Lab Results  Component Value Date   VD25OH 38.96 12/01/2020   Stable, cont oral replacement

## 2020-12-01 NOTE — Assessment & Plan Note (Signed)
Lab Results  Component Value Date   LDLCALC 85 12/01/2020   Stable, pt to continue current statin  - crestor 10

## 2020-12-01 NOTE — Assessment & Plan Note (Signed)

## 2020-12-01 NOTE — Assessment & Plan Note (Signed)
C/w prob OAB -  For urine cx, vesicare 5 qd, declines urology for now

## 2020-12-01 NOTE — Assessment & Plan Note (Signed)
Uncontrolled, to change the pepcid to protonix 40 qd

## 2020-12-02 ENCOUNTER — Telehealth: Payer: Self-pay | Admitting: Internal Medicine

## 2020-12-02 ENCOUNTER — Encounter: Payer: Self-pay | Admitting: Internal Medicine

## 2020-12-02 DIAGNOSIS — I6523 Occlusion and stenosis of bilateral carotid arteries: Secondary | ICD-10-CM

## 2020-12-02 DIAGNOSIS — I10 Essential (primary) hypertension: Secondary | ICD-10-CM

## 2020-12-02 DIAGNOSIS — E785 Hyperlipidemia, unspecified: Secondary | ICD-10-CM

## 2020-12-02 DIAGNOSIS — G4700 Insomnia, unspecified: Secondary | ICD-10-CM

## 2020-12-02 LAB — URINE CULTURE: Result:: NO GROWTH

## 2020-12-02 NOTE — Telephone Encounter (Signed)
Patient requesting ALL medications that were sent to local pharmacy 12/01/20 be sent to [facility], [place] instead.  zolpidem (AMBIEN) 10 MG tablet  solifenacin (VESICARE) 5 MG tablet  rosuvastatin (CRESTOR) 10 MG tablet pantoprazole (PROTONIX) 40 MG tablet meloxicam (MOBIC) 7.5 MG tablet losartan (COZAAR) 50 MG tablet gabapentin (NEURONTIN) 100 MG capsule DULoxetine (CYMBALTA) 60 MG capsule amLODipine (NORVASC) 10 MG tablet

## 2020-12-05 MED ORDER — AMLODIPINE BESYLATE 10 MG PO TABS: 10.0000 mg | ORAL_TABLET | Freq: Every day | ORAL | 3 refills | Status: DC

## 2020-12-05 MED ORDER — DULOXETINE HCL 60 MG PO CPEP: 60.0000 mg | ORAL_CAPSULE | Freq: Every day | ORAL | 3 refills | Status: DC

## 2020-12-05 MED ORDER — LOSARTAN POTASSIUM 50 MG PO TABS: 50.0000 mg | ORAL_TABLET | Freq: Every day | ORAL | 3 refills | Status: DC

## 2020-12-05 MED ORDER — GABAPENTIN 100 MG PO CAPS: 200.0000 mg | ORAL_CAPSULE | Freq: Every day | ORAL | 1 refills | Status: DC

## 2020-12-05 MED ORDER — ZOLPIDEM TARTRATE 10 MG PO TABS: ORAL_TABLET | ORAL | 1 refills | Status: DC

## 2020-12-05 MED ORDER — SOLIFENACIN SUCCINATE 5 MG PO TABS: 5.0000 mg | ORAL_TABLET | Freq: Every day | ORAL | 3 refills | Status: DC

## 2020-12-05 MED ORDER — SPIRONOLACTONE 25 MG PO TABS: 25.0000 mg | ORAL_TABLET | Freq: Two times a day (BID) | ORAL | 3 refills | Status: DC

## 2020-12-05 MED ORDER — PANTOPRAZOLE SODIUM 40 MG PO TBEC: 40.0000 mg | DELAYED_RELEASE_TABLET | Freq: Every day | ORAL | 3 refills | Status: DC

## 2020-12-05 MED ORDER — ROSUVASTATIN CALCIUM 10 MG PO TABS: 10.0000 mg | ORAL_TABLET | Freq: Every day | ORAL | 3 refills | Status: DC

## 2020-12-05 MED ORDER — MELOXICAM 7.5 MG PO TABS: 7.5000 mg | ORAL_TABLET | Freq: Every day | ORAL | 3 refills | Status: DC

## 2020-12-05 NOTE — Telephone Encounter (Signed)
Ok done

## 2020-12-08 ENCOUNTER — Ambulatory Visit (INDEPENDENT_AMBULATORY_CARE_PROVIDER_SITE_OTHER): Payer: Medicare Other

## 2020-12-08 ENCOUNTER — Other Ambulatory Visit: Payer: Self-pay

## 2020-12-08 DIAGNOSIS — E538 Deficiency of other specified B group vitamins: Secondary | ICD-10-CM

## 2020-12-08 MED ORDER — CYANOCOBALAMIN 1000 MCG/ML IJ SOLN
1000.0000 ug | Freq: Once | INTRAMUSCULAR | Status: AC
  Administered 2020-12-08: 1000 ug via INTRAMUSCULAR

## 2020-12-08 NOTE — Progress Notes (Signed)
B12 given cosign required

## 2020-12-15 DIAGNOSIS — Z20828 Contact with and (suspected) exposure to other viral communicable diseases: Secondary | ICD-10-CM | POA: Diagnosis not present

## 2020-12-15 DIAGNOSIS — Z1159 Encounter for screening for other viral diseases: Secondary | ICD-10-CM | POA: Diagnosis not present

## 2020-12-18 ENCOUNTER — Ambulatory Visit: Payer: Medicare Other | Admitting: Cardiology

## 2020-12-22 DIAGNOSIS — Z20828 Contact with and (suspected) exposure to other viral communicable diseases: Secondary | ICD-10-CM | POA: Diagnosis not present

## 2020-12-22 DIAGNOSIS — Z1159 Encounter for screening for other viral diseases: Secondary | ICD-10-CM | POA: Diagnosis not present

## 2021-01-01 DIAGNOSIS — Z1159 Encounter for screening for other viral diseases: Secondary | ICD-10-CM | POA: Diagnosis not present

## 2021-01-01 DIAGNOSIS — Z20828 Contact with and (suspected) exposure to other viral communicable diseases: Secondary | ICD-10-CM | POA: Diagnosis not present

## 2021-01-05 ENCOUNTER — Other Ambulatory Visit: Payer: Self-pay

## 2021-01-05 ENCOUNTER — Ambulatory Visit (INDEPENDENT_AMBULATORY_CARE_PROVIDER_SITE_OTHER): Payer: Medicare Other

## 2021-01-05 DIAGNOSIS — Z1159 Encounter for screening for other viral diseases: Secondary | ICD-10-CM | POA: Diagnosis not present

## 2021-01-05 DIAGNOSIS — E538 Deficiency of other specified B group vitamins: Secondary | ICD-10-CM | POA: Diagnosis not present

## 2021-01-05 DIAGNOSIS — Z20828 Contact with and (suspected) exposure to other viral communicable diseases: Secondary | ICD-10-CM | POA: Diagnosis not present

## 2021-01-05 MED ORDER — CYANOCOBALAMIN 1000 MCG/ML IJ SOLN
1000.0000 ug | Freq: Once | INTRAMUSCULAR | Status: AC
  Administered 2021-01-05: 1000 ug via INTRAMUSCULAR

## 2021-01-05 NOTE — Progress Notes (Signed)
B12 Given and Tolerated well

## 2021-01-12 DIAGNOSIS — Z1159 Encounter for screening for other viral diseases: Secondary | ICD-10-CM | POA: Diagnosis not present

## 2021-01-12 DIAGNOSIS — Z20828 Contact with and (suspected) exposure to other viral communicable diseases: Secondary | ICD-10-CM | POA: Diagnosis not present

## 2021-01-19 DIAGNOSIS — Z20828 Contact with and (suspected) exposure to other viral communicable diseases: Secondary | ICD-10-CM | POA: Diagnosis not present

## 2021-01-19 DIAGNOSIS — Z1159 Encounter for screening for other viral diseases: Secondary | ICD-10-CM | POA: Diagnosis not present

## 2021-01-26 ENCOUNTER — Encounter: Payer: Self-pay | Admitting: Cardiology

## 2021-01-26 ENCOUNTER — Ambulatory Visit: Payer: Medicare Other | Admitting: Cardiology

## 2021-01-26 ENCOUNTER — Other Ambulatory Visit: Payer: Self-pay

## 2021-01-26 VITALS — BP 130/84 | HR 83 | Ht 67.5 in | Wt 184.0 lb

## 2021-01-26 DIAGNOSIS — I10 Essential (primary) hypertension: Secondary | ICD-10-CM

## 2021-01-26 DIAGNOSIS — I340 Nonrheumatic mitral (valve) insufficiency: Secondary | ICD-10-CM

## 2021-01-26 NOTE — Patient Instructions (Signed)
Medication Instructions:  Your physician recommends that you continue on your current medications as directed. Please refer to the Current Medication list given to you today.  *If you need a refill on your cardiac medications before your next appointment, please call your pharmacy*   Testing/Procedures: Your physician has requested that you have an echocardiogram. Echocardiography is a painless test that uses sound waves to create images of your heart. It provides your doctor with information about the size and shape of your heart and how well your heart's chambers and valves are working. This procedure takes approximately one hour. There are no restrictions for this procedure.   Follow-Up: At [facility], you and your health needs are our priority.  As part of our continuing mission to provide you with exceptional heart care, we have created designated Provider Care Teams.  These Care Teams include your primary Cardiologist (physician) and Advanced Practice Providers (APPs -  Physician Assistants and Nurse Practitioners) who all work together to provide you with the care you need, when you need it.  Follow up with Dr. Radford Pax as needed.

## 2021-01-26 NOTE — Addendum Note (Signed)
Addended by: Antonieta Iba on: 01/26/2021 08:51 AM   Modules accepted: Orders

## 2021-01-26 NOTE — Progress Notes (Signed)
Date:  05/23/2020   ID:  Laura Poole DOB Mar 13, 1951 MRN [id number]  Cardiology Office Note    Date:  01/26/2021   ID:  Laura Poole, DOB [birth year], MRN [id number]  PCP:  Biagio Borg, MD  Cardiologist:  Fransico Him, MD   Chief Complaint  Patient presents with  . Hypertension  . Mitral Regurgitation    History of Present Illness:  Laura Poole is a 70 y.o. female with history of breast cancerand atypical chest pain as well as exertional dyspnea.  Patient has had a history of nonexertional chest pain. Myoview obtained in April 2011 showed no evidence for ischemia. She had a cardiac catheterization in April 2013 which showed no coronary disease. She had a carotid artery ultrasound in 2015, which showed elevated left ICA is velocity likely d3ue to tortuosity. She has chronic dyspnea on exertion.  Outpatient arterial Doppler for leg cramps was ordered which showed normal blood flow without obvious lower extremity stenosis.  2D echo showed moderate LVH and SAM in 2018.  Cardiac MRI 2018 showed normal LVF with no HOCM.  There was mild LVOT gradient caused by upper septal hypertrophy but no SAM.  No LGE.   She is here today for followup and is doing well.  She had some very bad GERD 2 months ago and her PPI was changed and her sx improved.  When she drinks a soda and belches the chest discomfort resolves.  She denies any exertional chest pain or pressure, SOB, DOE, PND, orthopnea, palpitations or syncope.  Rarely she will have some dizzy spells. She has some LE edema in her right foot related to prior foot surgery and is her toe mainly.  She is compliant with her meds and is tolerating meds with no SE.    Past Medical History:  Diagnosis Date  . Bilateral leg cramps 09/01/2015  . Carotid stenosis 06/23/2014  . Chest pain   . Essential hypertension 01/07/2010   Qualifier: Diagnosis of  By: Linna Darner MD, Gwyndolyn Saxon    . GERD (gastroesophageal reflux disease)   . H/O: hysterectomy    . Heart murmur 03/30/2017  . History of breast cancer 2006  . History of colonic polyps   . HLD (hyperlipidemia)   . HTN (hypertension)   . Mitral regurgitation 08/05/2015  . Personal history of chemotherapy 2006   Left Breast Cancer  . Personal history of radiation therapy 2006   Left Breast Cancer  . Snoring 03/30/2017    Past Surgical History:  Procedure Laterality Date  . ABDOMINAL HYSTERECTOMY    . BREAST LUMPECTOMY Left 2006  . COLONOSCOPY W/ POLYPECTOMY  2004, 2009   Dr Watt Climes  . TUBAL LIGATION      Current Medications: Current Meds  Medication Sig  . acetaminophen (TYLENOL) 500 MG tablet Take 500 mg by mouth every 6 (six) hours as needed for mild pain or headache.  [name] amLODipine (NORVASC) 10 MG tablet Take 1 tablet (10 mg total) by mouth daily.  [name] aspirin 81 MG tablet Take 1 tablet (81 mg total) by mouth daily.  . diphenhydrAMINE (BENADRYL) 25 mg capsule Take 25 mg by mouth every 6 (six) hours as needed for allergies.  . DULoxetine (CYMBALTA) 60 MG capsule Take 1 capsule (60 mg total) by mouth daily.  [name] gabapentin (NEURONTIN) 100 MG capsule Take 2 capsules (200 mg total) by mouth at bedtime.  [name] losartan (COZAAR) 50 MG tablet Take 1 tablet (50 mg total) by mouth daily.  [name]  meloxicam (MOBIC) 7.5 MG tablet Take 1 tablet (7.5 mg total) by mouth daily.  . pantoprazole (PROTONIX) 40 MG tablet Take 1 tablet (40 mg total) by mouth daily.  . rosuvastatin (CRESTOR) 10 MG tablet Take 1 tablet (10 mg total) by mouth daily.  . solifenacin (VESICARE) 5 MG tablet Take 1 tablet (5 mg total) by mouth daily.  [name] spironolactone (ALDACTONE) 25 MG tablet Take 1 tablet (25 mg total) by mouth 2 (two) times daily.  [name] tiZANidine (ZANAFLEX) 2 MG tablet TAKE 1 TABLET EVERY 6 HOURS AS NEEDED FOR MUSCLE SPASMS  . traMADol (ULTRAM) 50 MG tablet Take 1 tablet (50 mg total) by mouth every 6 (six) hours as needed.  . triamcinolone (NASACORT) 55 MCG/ACT AERO nasal inhaler Place 2 sprays into the nose daily.   . vitamin C (ASCORBIC ACID) 500 MG tablet Take 500 mg by mouth daily.  [name] zolpidem (AMBIEN) 10 MG tablet TAKE ONE TABLET AT BEDTIME AS NEEDED FOR SLEEP    Allergies:   Patient has no known allergies.   Social History   Socioeconomic History  . Marital status: Single    Spouse name: Not on file  . Number of children: Not on file  . Years of education: Not on file  . Highest education level: Not on file  Occupational History  . Not on file  Tobacco Use  . Smoking status: Never Smoker  . Smokeless tobacco: Never Used  Vaping Use  . Vaping Use: Never used  Substance and Sexual Activity  . Alcohol use: Yes    Alcohol/week: 2.0 standard drinks    Types: 2 Cans of beer per week    Comment: rarely  . Drug use: No  . Sexual activity: Not on file  Other Topics Concern  . Not on file  Social History Narrative  . Not on file   Social Determinants of Health   Financial Resource Strain: Not on file  Food Insecurity: Not on file  Transportation Needs: Not on file  Physical Activity: Not on file  Stress: Not on file  Social Connections: Not on file     Family History:  The patient's family history includes Aneurysm in her brother; Asthma in an other family member; Breast cancer in her paternal grandmother; Coronary artery disease in her mother; Heart attack (age of onset: 21) in her father; Hyperlipidemia in an other family member; Hypertension in her mother; Sudden death in an other family member.   ROS:   Please see the history of present illness.    ROS All other systems reviewed and are negative.  No flowsheet data found.   PHYSICAL EXAM:   VS:  BP 130/84   Pulse 83   Ht 5' 7.5" (1.715 m)   Wt 184 lb (83.5 kg)   SpO2 97%   BMI 28.39 kg/m     GEN: Well nourished, well developed in no acute distress HEENT: Normal NECK: No JVD; No carotid bruits LYMPHATICS: No lymphadenopathy CARDIAC:RRR, no murmurs, rubs, gallops RESPIRATORY:  Clear to auscultation without rales,  wheezing or rhonchi  ABDOMEN: Soft, non-tender, non-distended MUSCULOSKELETAL:  No edema; No deformity  SKIN: Warm and dry NEUROLOGIC:  Alert and oriented x 3 PSYCHIATRIC:  Normal affect    Wt Readings from Last 3 Encounters:  01/26/21 184 lb (83.5 kg)  12/01/20 183 lb (83 kg)  09/15/20 183 lb (83 kg)      Studies/Labs Reviewed:   EKG:  EKG is not ordered today.    Recent  Labs: 12/01/2020: ALT 20; BUN 13; Creatinine, Ser 0.84; Hemoglobin 13.0; Platelets 314.0; Potassium 4.6; Sodium 141; TSH 1.60   Lipid Panel    Component Value Date/Time   CHOL 175 12/01/2020 0926   TRIG 120.0 12/01/2020 0926   HDL 66.10 12/01/2020 0926   CHOLHDL 3 12/01/2020 0926   VLDL 24.0 12/01/2020 0926   LDLCALC 85 12/01/2020 0926    Additional studies/ records that were reviewed today include:  none  ASSESSMENT:    1. Essential hypertension   2. Nonrheumatic mitral valve regurgitation     PLAN:  In order of problems listed above:  1.  HTN -BP controlled on exam today  -2D echo showed moderate LVH and SAM in 2018 -cardiac MRI 2018 showed normal LVF with no HOCM.  There was mild LVOT gradient caused by upper septal hypertrophy but no SAM.  No LGE.  -LVH related to HTN and therefore needs aggressive BP control -continue amlodipine 10mg  daily, spiro 25mg  BID and Losartan 50mg  daily -encouraged her to follow a low Na diet < 2gm daily>>she has been using table salt  2.  MR -mild by echo 2018 -repeat echo to reassess   Medication Adjustments/Labs and Tests Ordered: Current medicines are reviewed at length with the patient today.  Concerns regarding medicines are outlined above.  Medication changes, Labs and Tests ordered today are listed in the Patient Instructions below.  Followup with me in 1 year  Signed, Fransico Him, MD  01/26/2021 8:47 AM    [address], [place], [place]  [address] Phone: [phone]; Fax: [phone]

## 2021-01-29 DIAGNOSIS — Z20828 Contact with and (suspected) exposure to other viral communicable diseases: Secondary | ICD-10-CM | POA: Diagnosis not present

## 2021-01-29 DIAGNOSIS — Z1159 Encounter for screening for other viral diseases: Secondary | ICD-10-CM | POA: Diagnosis not present

## 2021-02-05 ENCOUNTER — Telehealth: Payer: Self-pay

## 2021-02-05 DIAGNOSIS — Z1159 Encounter for screening for other viral diseases: Secondary | ICD-10-CM | POA: Diagnosis not present

## 2021-02-05 DIAGNOSIS — Z20828 Contact with and (suspected) exposure to other viral communicable diseases: Secondary | ICD-10-CM | POA: Diagnosis not present

## 2021-02-05 NOTE — Telephone Encounter (Signed)
Pt had OV on 12/01/20.  Stated in chief complain/HPI is "here for wellness exam".  Please confirm that this can be coded as annual AWV CPT Code [phone] or if this patient needs to return for AWV.

## 2021-02-05 NOTE — Telephone Encounter (Signed)
Not sure about the G code  But we would want to see her back for HTN at least in 6 mo  And OK for Health Coach wellness exam as well if she likes, thanks

## 2021-02-05 NOTE — Telephone Encounter (Signed)
LVM requesting pt to call back to schedule appt with PCP & NHA.  Will send mychart message as well.

## 2021-02-06 ENCOUNTER — Other Ambulatory Visit: Payer: Self-pay

## 2021-02-09 ENCOUNTER — Ambulatory Visit: Payer: Medicare (Managed Care)

## 2021-02-09 ENCOUNTER — Telehealth: Payer: Self-pay

## 2021-02-09 ENCOUNTER — Other Ambulatory Visit: Payer: Self-pay

## 2021-02-09 NOTE — Telephone Encounter (Signed)
Pt in office today for B12 injection.   Appts made; pt aware.

## 2021-02-09 NOTE — Telephone Encounter (Signed)
Verbal orders rec'd for pt to orally replace b12.  B12 1047mcg daily discussed with pt.  Pt verb understanding.  Appt canceled.

## 2021-02-09 NOTE — Telephone Encounter (Signed)
Last OV 12/01/20, notes indicate B12 deficiency is "stable, cont oral replacement - b12 1040mcg qd".  Pt is scheduled for b12 injection today.  Order clarification needed.

## 2021-02-12 DIAGNOSIS — Z20828 Contact with and (suspected) exposure to other viral communicable diseases: Secondary | ICD-10-CM | POA: Diagnosis not present

## 2021-02-12 DIAGNOSIS — Z1159 Encounter for screening for other viral diseases: Secondary | ICD-10-CM | POA: Diagnosis not present

## 2021-03-02 ENCOUNTER — Other Ambulatory Visit: Payer: Self-pay

## 2021-03-02 ENCOUNTER — Ambulatory Visit (HOSPITAL_COMMUNITY): Payer: Medicare (Managed Care) | Attending: Cardiovascular Disease

## 2021-03-02 DIAGNOSIS — I340 Nonrheumatic mitral (valve) insufficiency: Secondary | ICD-10-CM | POA: Diagnosis not present

## 2021-03-02 LAB — ECHOCARDIOGRAM COMPLETE
Area-P 1/2: 3.33 cm2
MV M vel: 5.91 m/s
MV Peak grad: 139.5 mmHg
S' Lateral: 1.7 cm

## 2021-03-03 ENCOUNTER — Encounter: Payer: Self-pay | Admitting: Cardiology

## 2021-03-03 ENCOUNTER — Telehealth: Payer: Self-pay

## 2021-03-03 DIAGNOSIS — I350 Nonrheumatic aortic (valve) stenosis: Secondary | ICD-10-CM

## 2021-03-03 DIAGNOSIS — I340 Nonrheumatic mitral (valve) insufficiency: Secondary | ICD-10-CM

## 2021-03-03 NOTE — Telephone Encounter (Signed)
-----   Message from Sueanne Margarita, MD sent at 03/03/2021  9:00 AM EDT ----- Echo showed low normal LVF with EF 50-55% with thickened heart muscle, mildly enlarged atria and mildly leaky MV.  Mild to moderate AS - repeat echo in 1 year

## 2021-03-03 NOTE — Telephone Encounter (Signed)
The patient has been notified of the result and verbalized understanding.  All questions (if any) were answered. Antonieta Iba, RN 03/03/2021 11:59 AM

## 2021-03-05 ENCOUNTER — Other Ambulatory Visit: Payer: Self-pay | Admitting: Internal Medicine

## 2021-03-05 DIAGNOSIS — Z1159 Encounter for screening for other viral diseases: Secondary | ICD-10-CM | POA: Diagnosis not present

## 2021-03-05 DIAGNOSIS — Z20828 Contact with and (suspected) exposure to other viral communicable diseases: Secondary | ICD-10-CM | POA: Diagnosis not present

## 2021-03-05 DIAGNOSIS — I6523 Occlusion and stenosis of bilateral carotid arteries: Secondary | ICD-10-CM

## 2021-03-16 ENCOUNTER — Ambulatory Visit: Payer: Medicare (Managed Care) | Admitting: Internal Medicine

## 2021-03-16 ENCOUNTER — Ambulatory Visit: Payer: Medicare (Managed Care)

## 2021-03-18 ENCOUNTER — Ambulatory Visit: Payer: Medicare (Managed Care)

## 2021-03-19 ENCOUNTER — Other Ambulatory Visit: Payer: Self-pay

## 2021-03-19 ENCOUNTER — Ambulatory Visit (INDEPENDENT_AMBULATORY_CARE_PROVIDER_SITE_OTHER): Payer: Medicare (Managed Care)

## 2021-03-19 VITALS — BP 124/80 | HR 89 | Temp 98.1°F | Ht 68.0 in | Wt 179.2 lb

## 2021-03-19 DIAGNOSIS — Z20828 Contact with and (suspected) exposure to other viral communicable diseases: Secondary | ICD-10-CM | POA: Diagnosis not present

## 2021-03-19 DIAGNOSIS — Z0000 Encounter for general adult medical examination without abnormal findings: Secondary | ICD-10-CM

## 2021-03-19 DIAGNOSIS — Z1159 Encounter for screening for other viral diseases: Secondary | ICD-10-CM | POA: Diagnosis not present

## 2021-03-19 NOTE — Patient Instructions (Addendum)
Laura Poole , Thank you for taking time to come for your Medicare Wellness Visit. I appreciate your ongoing commitment to your health goals. Please review the following plan we discussed and let me know if I can assist you in the future.   Screening recommendations/referrals: Colonoscopy: 08/20/2019; due every 10 years Mammogram: 09/29/2020; due every 2 years Bone Density: 09/14/2012; due every 3-5 years Recommended yearly ophthalmology/optometry visit for glaucoma screening and checkup Recommended yearly dental visit for hygiene and checkup  Vaccinations: Influenza vaccine: 08/04/2020 Pneumococcal vaccine: 06/27/2018, 01/09/2020 Tdap vaccine: 12/04/2012; due every 10 years Shingles vaccine: never done; insurance does not cover   Covid-19: 10/26/2019, 11/23/2019, 08/07/2020, 03/19/2021  Advanced directives: Advance directive discussed with you today. Even though you declined this today please call our office should you change your mind and we can give you the proper paperwork for you to fill out.  Conditions/risks identified: Goals:  Recommend to drink at least 6-8 8oz glasses of water per day.   Recommend to exercise for at least 150 minutes per week.   Recommend to remove any items from the home that may cause slips or trips.   Recommend to decrease portion sizes by eating 3 small healthy meals and at least 2 healthy snacks per day.   Recommend to begin DASH diet as directed below   Recommend to continue efforts to reduce smoking habits until no longer smoking. Smoking Cessation literature is attached below  Next appointment: 03/22/2022 at 3:30 pm with [facility] 65 Years and Older, Female Preventive care refers to lifestyle choices and visits with your health care provider that can promote health and wellness. What does preventive care include? A yearly physical exam. This is also called an annual well check. Dental exams once or twice a year. Routine eye exams. Ask  your health care provider how often you should have your eyes checked. Personal lifestyle choices, including: Daily care of your teeth and gums. Regular physical activity. Eating a healthy diet. Avoiding tobacco and drug use. Limiting alcohol use. Practicing safe sex. Taking low-dose aspirin every day. Taking vitamin and mineral supplements as recommended by your health care provider. What happens during an annual well check? The services and screenings done by your health care provider during your annual well check will depend on your age, overall health, lifestyle risk factors, and family history of disease. Counseling  Your health care provider may ask you questions about your: Alcohol use. Tobacco use. Drug use. Emotional well-being. Home and relationship well-being. Sexual activity. Eating habits. History of falls. Memory and ability to understand (cognition). Work and work Statistician. Reproductive health. Screening  You may have the following tests or measurements: Height, weight, and BMI. Blood pressure. Lipid and cholesterol levels. These may be checked every 5 years, or more frequently if you are over 50 years old. Skin check. Lung cancer screening. You may have this screening every year starting at age 18 if you have a 30-pack-year history of smoking and currently smoke or have quit within the past 15 years. Fecal occult blood test (FOBT) of the stool. You may have this test every year starting at age 42. Flexible sigmoidoscopy or colonoscopy. You may have a sigmoidoscopy every 5 years or a colonoscopy every 10 years starting at age 93. Hepatitis C blood test. Hepatitis B blood test. Sexually transmitted disease (STD) testing. Diabetes screening. This is done by checking your blood sugar (glucose) after you have not eaten for a while (fasting). You may  have this done every 1-3 years. Bone density scan. This is done to screen for osteoporosis. You may have this done  starting at age 25. Mammogram. This may be done every 1-2 years. Talk to your health care provider about how often you should have regular mammograms. Talk with your health care provider about your test results, treatment options, and if necessary, the need for more tests. Vaccines  Your health care provider may recommend certain vaccines, such as: Influenza vaccine. This is recommended every year. Tetanus, diphtheria, and acellular pertussis (Tdap, Td) vaccine. You may need a Td booster every 10 years. Zoster vaccine. You may need this after age 72. Pneumococcal 13-valent conjugate (PCV13) vaccine. One dose is recommended after age 51. Pneumococcal polysaccharide (PPSV23) vaccine. One dose is recommended after age 69. Talk to your health care provider about which screenings and vaccines you need and how often you need them. This information is not intended to replace advice given to you by your health care provider. Make sure you discuss any questions you have with your health care provider. Document Released: 10/24/2015 Document Revised: 06/16/2016 Document Reviewed: 07/29/2015 Elsevier Interactive Patient Education  2017 [place] Prevention in the Home Falls can cause injuries. They can happen to people of all ages. There are many things you can do to make your home safe and to help prevent falls. What can I do on the outside of my home? Regularly fix the edges of walkways and driveways and fix any cracks. Remove anything that might make you trip as you walk through a door, such as a raised step or threshold. Trim any bushes or trees on the path to your home. Use bright outdoor lighting. Clear any walking paths of anything that might make someone trip, such as rocks or tools. Regularly check to see if handrails are loose or broken. Make sure that both sides of any steps have handrails. Any raised decks and porches should have guardrails on the edges. Have any leaves, snow, or  ice cleared regularly. Use sand or salt on walking paths during winter. Clean up any spills in your garage right away. This includes oil or grease spills. What can I do in the bathroom? Use night lights. Install grab bars by the toilet and in the tub and shower. Do not use towel bars as grab bars. Use non-skid mats or decals in the tub or shower. If you need to sit down in the shower, use a plastic, non-slip stool. Keep the floor dry. Clean up any water that spills on the floor as soon as it happens. Remove soap buildup in the tub or shower regularly. Attach bath mats securely with double-sided non-slip rug tape. Do not have throw rugs and other things on the floor that can make you trip. What can I do in the bedroom? Use night lights. Make sure that you have a light by your bed that is easy to reach. Do not use any sheets or blankets that are too big for your bed. They should not hang down onto the floor. Have a firm chair that has side arms. You can use this for support while you get dressed. Do not have throw rugs and other things on the floor that can make you trip. What can I do in the kitchen? Clean up any spills right away. Avoid walking on wet floors. Keep items that you use a lot in easy-to-reach places. If you need to reach something above you, use a strong step  stool that has a grab bar. Keep electrical cords out of the way. Do not use floor polish or wax that makes floors slippery. If you must use wax, use non-skid floor wax. Do not have throw rugs and other things on the floor that can make you trip. What can I do with my stairs? Do not leave any items on the stairs. Make sure that there are handrails on both sides of the stairs and use them. Fix handrails that are broken or loose. Make sure that handrails are as long as the stairways. Check any carpeting to make sure that it is firmly attached to the stairs. Fix any carpet that is loose or worn. Avoid having throw rugs at  the top or bottom of the stairs. If you do have throw rugs, attach them to the floor with carpet tape. Make sure that you have a light switch at the top of the stairs and the bottom of the stairs. If you do not have them, ask someone to add them for you. What else can I do to help prevent falls? Wear shoes that: Do not have high heels. Have rubber bottoms. Are comfortable and fit you well. Are closed at the toe. Do not wear sandals. If you use a stepladder: Make sure that it is fully opened. Do not climb a closed stepladder. Make sure that both sides of the stepladder are locked into place. Ask someone to hold it for you, if possible. Clearly mark and make sure that you can see: Any grab bars or handrails. First and last steps. Where the edge of each step is. Use tools that help you move around (mobility aids) if they are needed. These include: Canes. Walkers. Scooters. Crutches. Turn on the lights when you go into a dark area. Replace any light bulbs as soon as they burn out. Set up your furniture so you have a clear path. Avoid moving your furniture around. If any of your floors are uneven, fix them. If there are any pets around you, be aware of where they are. Review your medicines with your doctor. Some medicines can make you feel dizzy. This can increase your chance of falling. Ask your doctor what other things that you can do to help prevent falls. This information is not intended to replace advice given to you by your health care provider. Make sure you discuss any questions you have with your health care provider. Document Released: 07/24/2009 Document Revised: 03/04/2016 Document Reviewed: 11/01/2014 Elsevier Interactive Patient Education  2017 [employer].

## 2021-03-19 NOTE — Progress Notes (Addendum)
Subjective:   Laura Poole is a 70 y.o. female who presents for Medicare Annual (Subsequent) preventive examination.  Review of Systems    Cardiac Risk Factors include: advanced age (>62men, >13 women);dyslipidemia;family history of premature cardiovascular disease;hypertension     Objective:    Today's Vitals   03/19/21 1527  BP: 124/80  Pulse: 89  Temp: 98.1 F (36.7 C)  SpO2: 99%  Weight: 179 lb 3.2 oz (81.3 kg)  Height: 5\' 8"  (1.727 m)  PainSc: 0-No pain   Body mass index is 27.25 kg/m.  Advanced Directives 03/19/2021 01/28/2012  Does Patient Have a Medical Advance Directive? No Patient does not have advance directive  Pre-existing out of facility DNR order (yellow form or pink MOST form) - No    Current Medications (verified) Outpatient Encounter Medications as of 03/19/2021  Medication Sig   acetaminophen (TYLENOL) 500 MG tablet Take 500 mg by mouth every 6 (six) hours as needed for mild pain or headache.   amLODipine (NORVASC) 10 MG tablet TAKE 1 TABLET DAILY   aspirin 81 MG tablet Take 1 tablet (81 mg total) by mouth daily.   diphenhydrAMINE (BENADRYL) 25 mg capsule Take 25 mg by mouth every 6 (six) hours as needed for allergies.   DULoxetine (CYMBALTA) 60 MG capsule Take 1 capsule (60 mg total) by mouth daily.   gabapentin (NEURONTIN) 100 MG capsule Take 2 capsules (200 mg total) by mouth at bedtime.   losartan (COZAAR) 50 MG tablet Take 1 tablet (50 mg total) by mouth daily.   meloxicam (MOBIC) 7.5 MG tablet Take 1 tablet (7.5 mg total) by mouth daily.   pantoprazole (PROTONIX) 40 MG tablet Take 1 tablet (40 mg total) by mouth daily.   rosuvastatin (CRESTOR) 10 MG tablet Take 1 tablet (10 mg total) by mouth daily.   solifenacin (VESICARE) 5 MG tablet Take 1 tablet (5 mg total) by mouth daily.   spironolactone (ALDACTONE) 25 MG tablet Take 1 tablet (25 mg total) by mouth 2 (two) times daily.   tiZANidine (ZANAFLEX) 2 MG tablet TAKE 1 TABLET EVERY 6 HOURS AS  NEEDED FOR MUSCLE SPASMS   traMADol (ULTRAM) 50 MG tablet Take 1 tablet (50 mg total) by mouth every 6 (six) hours as needed.   triamcinolone (NASACORT) 55 MCG/ACT AERO nasal inhaler Place 2 sprays into the nose daily.   vitamin C (ASCORBIC ACID) 500 MG tablet Take 500 mg by mouth daily.   zolpidem (AMBIEN) 10 MG tablet TAKE ONE TABLET AT BEDTIME AS NEEDED FOR SLEEP   No facility-administered encounter medications on file as of 03/19/2021.    Allergies (verified) Patient has no known allergies.   History: Past Medical History:  Diagnosis Date   Aortic stenosis    mild to moderate on echo 02/2021   Bilateral leg cramps 09/01/2015   Essential hypertension 01/07/2010   Qualifier: Diagnosis of  By: Linna Darner MD, Gwyndolyn Saxon     GERD (gastroesophageal reflux disease)    H/O: hysterectomy    History of breast cancer 2006   History of colonic polyps    HLD (hyperlipidemia)    Mitral regurgitation 08/05/2015   Personal history of chemotherapy 2006   Left Breast Cancer   Personal history of radiation therapy 2006   Left Breast Cancer   Snoring 03/30/2017   Past Surgical History:  Procedure Laterality Date   ABDOMINAL HYSTERECTOMY     BREAST LUMPECTOMY Left 2006   COLONOSCOPY W/ POLYPECTOMY  2004, 2009   Dr Watt Climes   TUBAL  LIGATION     Family History  Problem Relation Age of Onset   Heart attack Father 65   Hypertension Mother    Coronary artery disease Mother    Aneurysm Brother    Asthma Other        nephew   Hyperlipidemia Other        nephew   Sudden death Other        nephew   Breast cancer Paternal Grandmother    Colon cancer Neg Hx    Social History   Socioeconomic History   Marital status: Single    Spouse name: Not on file   Number of children: Not on file   Years of education: 12   Highest education level: High school graduate  Occupational History   Not on file  Tobacco Use   Smoking status: Never   Smokeless tobacco: Never  Vaping Use   Vaping Use: Never used   Substance and Sexual Activity   Alcohol use: Yes    Alcohol/week: 2.0 standard drinks    Types: 2 Cans of beer per week    Comment: rarely   Drug use: No   Sexual activity: Not on file  Other Topics Concern   Not on file  Social History Narrative   Not on file   Social Determinants of Health   Financial Resource Strain: Low Risk    Difficulty of Paying Living Expenses: Not hard at all  Food Insecurity: No Food Insecurity   Worried About Charity fundraiser in the Last Year: Never true   [place] in the Last Year: Never true  Transportation Needs: No Transportation Needs   Lack of Transportation (Medical): No   Lack of Transportation (Non-Medical): No  Physical Activity: Inactive   Days of Exercise per Week: 0 days   Minutes of Exercise per Session: 0 min  Stress: No Stress Concern Present   Feeling of Stress : Not at all  Social Connections: Moderately Integrated   Frequency of Communication with Friends and Family: More than three times a week   Frequency of Social Gatherings with Friends and Family: More than three times a week   Attends Religious Services: More than 4 times per year   Active Member of [employer] or Organizations: No   Attends Music therapist: More than 4 times per year   Marital Status: Never married    Tobacco Counseling Counseling given: Not Answered   Clinical Intake:  Pre-visit preparation completed: Yes  Pain : No/denies pain Pain Score: 0-No pain     BMI - recorded: 27.25 Nutritional Status: BMI 25 -29 Overweight Nutritional Risks: None Diabetes: No  How often do you need to have someone help you when you read instructions, pamphlets, or other written materials from your doctor or pharmacy?: 1 - Never What is the last grade level you completed in school?: High School Grduate  Diabetic? no  Interpreter Needed?: No  Information entered by :: Parthenia Ames, LPN   Activities of Daily Living In your present  state of health, do you have any difficulty performing the following activities: 03/19/2021 01/05/2021  Hearing? N N  Vision? N N  Difficulty concentrating or making decisions? N N  Walking or climbing stairs? N N  Dressing or bathing? N N  Doing errands, shopping? N N  Preparing Food and eating ? N -  Using the Toilet? N -  In the past six months, have you accidently leaked urine? N -  Do you have problems with loss of bowel control? N -  Managing your Medications? N -  Managing your Finances? N -  Housekeeping or managing your Housekeeping? N -  Some recent data might be hidden    Patient Care Team: Biagio Borg, MD as PCP - General (Internal Medicine) Sueanne Margarita, MD as PCP - Cardiology (Cardiology)  Indicate any recent Medical Services you may have received from other than [facility] providers in the past year (date may be approximate).     Assessment:   This is a routine wellness examination for [place].  Hearing/Vision screen No results found.  Dietary issues and exercise activities discussed: Current Exercise Habits: The patient does not participate in regular exercise at present, Exercise limited by: None identified   Goals Addressed             This Visit's Progress    Patient Stated       My goal is to eat healthy, get plenty of rest and enjoy my seven grandchildren.       Depression Screen PHQ 2/9 Scores 03/19/2021 01/09/2020 03/16/2019 08/28/2018 07/29/2017 05/17/2016  PHQ - 2 Score 0 0 0 0 0 0  PHQ- 9 Score - - - 2 - -    Fall Risk Fall Risk  03/19/2021 01/09/2020 12/07/2019 03/16/2019 08/28/2018  Falls in the past year? 0 0 0 0 1  Number falls in past yr: 0 - 0 - 0  Injury with Fall? 0 - 0 - 0  Risk for fall due to : No Fall Risks - - - -  Follow up Falls evaluation completed - - - -    FALL RISK PREVENTION PERTAINING TO THE HOME:  Any stairs in or around the home? No  If so, are there any without handrails? No  Home free of loose throw rugs in  walkways, pet beds, electrical cords, etc? Yes  Adequate lighting in your home to reduce risk of falls? Yes   ASSISTIVE DEVICES UTILIZED TO PREVENT FALLS:  Life alert? No  Use of a cane, walker or w/c? No  Grab bars in the bathroom? No  Shower chair or bench in shower? No  Elevated toilet seat or a handicapped toilet? Yes   TIMED UP AND GO:  Was the test performed? Yes .  Length of time to ambulate 10 feet: 5 sec.   Gait steady and fast without use of assistive device  Cognitive Function: Normal cognitive status assessed by direct observation by this Nurse Health Advisor. No abnormalities found.          Immunizations Immunization History  Administered Date(s) Administered   Fluad Quad(high Dose 65+) 06/15/2019, 08/04/2020   Influenza Split 08/09/2012   Influenza, High Dose Seasonal PF 07/29/2017, 06/27/2018   Influenza-Unspecified 07/11/2013   Moderna SARS-COV2 Booster Vaccination 08/07/2020, 03/19/2021   Moderna Sars-Covid-2 Vaccination 10/26/2019, 11/23/2019   Pneumococcal Conjugate-13 06/27/2018   Pneumococcal Polysaccharide-23 01/09/2020   Tetanus 12/04/2012   Zoster, Live 05/20/2016    TDAP status: Up to date  Flu Vaccine status: Up to date  Pneumococcal vaccine status: Up to date  Covid-19 vaccine status: Completed vaccines  Qualifies for Shingles Vaccine? Yes   Zostavax completed No   Shingrix Completed?: No.    Education has been provided regarding the importance of this vaccine. Patient has been advised to call insurance company to determine out of pocket expense if they have not yet received this vaccine. Advised may also receive vaccine at local pharmacy or  Health Dept. Verbalized acceptance and understanding.  Screening Tests Health Maintenance  Topic Date Due   Zoster Vaccines- Shingrix (1 of 2) Never done   INFLUENZA VACCINE  05/11/2021   MAMMOGRAM  09/29/2022   TETANUS/TDAP  12/04/2022   COLONOSCOPY (Pts 45-26yrs Insurance coverage will need  to be confirmed)  08/19/2029   DEXA SCAN  Completed   COVID-19 Vaccine  Completed   Hepatitis C Screening  Completed   PNA vac Low Risk Adult  Completed   HPV VACCINES  Aged Out    Health Maintenance  Health Maintenance Due  Topic Date Due   Zoster Vaccines- Shingrix (1 of 2) Never done    Colorectal cancer screening: Type of screening: Colonoscopy. Completed 08/20/2019. Repeat every 10 years  Mammogram status: Completed 09/29/2020. Repeat every year  Bone Density status: Completed 09/14/2012. Results reflect: Bone density results: NORMAL. Repeat every 3-5 years.  Lung Cancer Screening: (Low Dose CT Chest recommended if Age 22-80 years, 30 pack-year currently smoking OR have quit w/in 15years.) does not qualify.   Lung Cancer Screening Referral: no  Additional Screening:  Hepatitis C Screening: does qualify; Completed yes  Vision Screening: Recommended annual ophthalmology exams for early detection of glaucoma and other disorders of the eye. Is the patient up to date with their annual eye exam?  Yes  Who is the provider or what is the name of the office in which the patient attends annual eye exams? [facility] If pt is not established with a provider, would they like to be referred to a provider to establish care? No .   Dental Screening: Recommended annual dental exams for proper oral hygiene  Community Resource Referral / Chronic Care Management: CRR required this visit?  No   CCM required this visit?  No      Plan:     I have personally reviewed and noted the following in the patient's chart:   Medical and social history Use of alcohol, tobacco or illicit drugs  Current medications and supplements including opioid prescriptions.  Functional ability and status Nutritional status Physical activity Advanced directives List of other physicians Hospitalizations, surgeries, and ER visits in previous 12 months Vitals Screenings to include cognitive,  depression, and falls Referrals and appointments  In addition, I have reviewed and discussed with patient certain preventive protocols, quality metrics, and best practice recommendations. A written personalized care plan for preventive services as well as general preventive health recommendations were provided to patient.     Sheral Flow, LPN   01/12/3153   Nurse Notes: n/a

## 2021-03-23 DIAGNOSIS — M79672 Pain in left foot: Secondary | ICD-10-CM | POA: Diagnosis not present

## 2021-03-23 DIAGNOSIS — Z20828 Contact with and (suspected) exposure to other viral communicable diseases: Secondary | ICD-10-CM | POA: Diagnosis not present

## 2021-03-23 DIAGNOSIS — Z1159 Encounter for screening for other viral diseases: Secondary | ICD-10-CM | POA: Diagnosis not present

## 2021-03-23 DIAGNOSIS — M79671 Pain in right foot: Secondary | ICD-10-CM | POA: Diagnosis not present

## 2021-04-06 DIAGNOSIS — Z1159 Encounter for screening for other viral diseases: Secondary | ICD-10-CM | POA: Diagnosis not present

## 2021-04-06 DIAGNOSIS — Z20828 Contact with and (suspected) exposure to other viral communicable diseases: Secondary | ICD-10-CM | POA: Diagnosis not present

## 2021-04-16 DIAGNOSIS — Z20828 Contact with and (suspected) exposure to other viral communicable diseases: Secondary | ICD-10-CM | POA: Diagnosis not present

## 2021-04-16 DIAGNOSIS — Z1159 Encounter for screening for other viral diseases: Secondary | ICD-10-CM | POA: Diagnosis not present

## 2021-04-20 ENCOUNTER — Other Ambulatory Visit (HOSPITAL_BASED_OUTPATIENT_CLINIC_OR_DEPARTMENT_OTHER): Payer: Self-pay

## 2021-04-20 ENCOUNTER — Other Ambulatory Visit: Payer: Self-pay

## 2021-04-20 ENCOUNTER — Ambulatory Visit: Payer: Medicare (Managed Care) | Attending: Internal Medicine

## 2021-04-20 DIAGNOSIS — Z23 Encounter for immunization: Secondary | ICD-10-CM

## 2021-04-20 MED ORDER — COVID-19 AT HOME ANTIGEN TEST VI KIT
PACK | 0 refills | Status: DC
  Filled 2021-04-20: qty 4, 8d supply, fill #0

## 2021-04-20 MED ORDER — COVID-19 MRNA VACC (MODERNA) 100 MCG/0.5ML IM SUSP
INTRAMUSCULAR | 0 refills | Status: DC
  Filled 2021-04-20: qty 0.25, 1d supply, fill #0

## 2021-04-20 NOTE — Progress Notes (Signed)
   Covid-19 Vaccination Clinic  Name:  Laura Poole    MRN: [id number] DOB: 01/18/1951  04/20/2021  Laura Poole was observed post Covid-19 immunization for 15 minutes without incident. She was provided with Vaccine Information Sheet and instruction to access the V-Safe system.   Laura Poole was instructed to call 911 with any severe reactions post vaccine: Difficulty breathing  Swelling of face and throat  A fast heartbeat  A bad rash all over body  Dizziness and weakness   Immunizations Administered     Name Date Dose VIS Date Route   Moderna Covid-19 Booster Vaccine 04/20/2021  9:46 AM 0.25 mL 07/30/2020 Intramuscular   Manufacturer: Moderna   Lot: [id number]   [place]: [id number]

## 2021-04-23 ENCOUNTER — Encounter: Payer: Self-pay | Admitting: Internal Medicine

## 2021-04-23 DIAGNOSIS — Z1159 Encounter for screening for other viral diseases: Secondary | ICD-10-CM | POA: Diagnosis not present

## 2021-04-23 DIAGNOSIS — Z20828 Contact with and (suspected) exposure to other viral communicable diseases: Secondary | ICD-10-CM | POA: Diagnosis not present

## 2021-04-24 ENCOUNTER — Ambulatory Visit
Admission: EM | Admit: 2021-04-24 | Discharge: 2021-04-24 | Disposition: A | Discharge status: Home / Self Care | Payer: Medicare (Managed Care) | Attending: Urgent Care | Admitting: Urgent Care

## 2021-04-24 ENCOUNTER — Other Ambulatory Visit: Payer: Self-pay

## 2021-04-24 DIAGNOSIS — I10 Essential (primary) hypertension: Secondary | ICD-10-CM

## 2021-04-24 DIAGNOSIS — R070 Pain in throat: Secondary | ICD-10-CM

## 2021-04-24 DIAGNOSIS — U071 COVID-19: Secondary | ICD-10-CM

## 2021-04-24 DIAGNOSIS — Z20822 Contact with and (suspected) exposure to covid-19: Secondary | ICD-10-CM | POA: Diagnosis not present

## 2021-04-24 DIAGNOSIS — R059 Cough, unspecified: Secondary | ICD-10-CM

## 2021-04-24 MED ORDER — NIRMATRELVIR/RITONAVIR (PAXLOVID)TABLET: 3.0000 | ORAL_TABLET | Freq: Two times a day (BID) | ORAL | 0 refills | Status: AC

## 2021-04-24 MED ORDER — PROMETHAZINE-DM 6.25-15 MG/5ML PO SYRP: 5.0000 mL | ORAL_SOLUTION | Freq: Every evening | ORAL | 0 refills | Status: DC | PRN

## 2021-04-24 MED ORDER — MOLNUPIRAVIR EUA 200MG CAPSULE: 4.0000 | ORAL_CAPSULE | Freq: Two times a day (BID) | ORAL | 0 refills | Status: DC

## 2021-04-24 MED ORDER — HYDROCODONE BIT-HOMATROP MBR 5-1.5 MG/5ML PO SOLN: 5.0000 mL | Freq: Four times a day (QID) | ORAL | 0 refills | Status: AC | PRN

## 2021-04-24 MED ORDER — BENZONATATE 100 MG PO CAPS: 100.0000 mg | ORAL_CAPSULE | Freq: Three times a day (TID) | ORAL | 0 refills | Status: DC | PRN

## 2021-04-24 MED ORDER — MOLNUPIRAVIR EUA 200MG CAPSULE: 4.0000 | ORAL_CAPSULE | Freq: Two times a day (BID) | ORAL | 0 refills | Status: AC

## 2021-04-24 NOTE — ED Provider Notes (Signed)
[place]   MRN: [id number] DOB: 1950-10-30  Subjective:   Laura Poole is a 70 y.o. female presenting for 3-day history of acute onset malaise, fatigue, cough, throat pain, sinus congestion, sinus headaches.  Patient received a COVID booster on Monday and then developed the symptoms that she has now 2 days later.  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  Her daughter also tested positive.  Denies chest pain, shortness of breath, wheezing, history of respiratory disorders.  She does have a history of hypertension, did not take her blood pressure medications this morning.  Denies weakness, numbness or tingling, confusion.  No current facility-administered medications for this encounter.  Current Outpatient Medications:    acetaminophen (TYLENOL) 500 MG tablet, Take 500 mg by mouth every 6 (six) hours as needed for mild pain or headache., Disp: , Rfl:    amLODipine (NORVASC) 10 MG tablet, TAKE 1 TABLET DAILY, Disp: 90 tablet, Rfl: 2   aspirin 81 MG tablet, Take 1 tablet (81 mg total) by mouth daily., Disp: 30 tablet, Rfl: 6   COVID-19 At Home Antigen Test KIT, Use as directed, Disp: 4 kit, Rfl: 0   COVID-19 mRNA vaccine, Moderna, 100 MCG/0.5ML injection, Inject into the muscle., Disp: 0.25 mL, Rfl: 0   diphenhydrAMINE (BENADRYL) 25 mg capsule, Take 25 mg by mouth every 6 (six) hours as needed for allergies., Disp: , Rfl:    DULoxetine (CYMBALTA) 60 MG capsule, Take 1 capsule (60 mg total) by mouth daily., Disp: 90 capsule, Rfl: 3   gabapentin (NEURONTIN) 100 MG capsule, Take 2 capsules (200 mg total) by mouth at bedtime., Disp: 180 capsule, Rfl: 1   losartan (COZAAR) 50 MG tablet, Take 1 tablet (50 mg total) by mouth daily., Disp: 90 tablet, Rfl: 3   meloxicam (MOBIC) 7.5 MG tablet, Take 1 tablet (7.5 mg total) by mouth daily., Disp: 90 tablet, Rfl: 3   pantoprazole (PROTONIX) 40 MG tablet, Take 1 tablet (40 mg total)  by mouth daily., Disp: 90 tablet, Rfl: 3   rosuvastatin (CRESTOR) 10 MG tablet, Take 1 tablet (10 mg total) by mouth daily., Disp: 90 tablet, Rfl: 3   solifenacin (VESICARE) 5 MG tablet, Take 1 tablet (5 mg total) by mouth daily., Disp: 90 tablet, Rfl: 3   spironolactone (ALDACTONE) 25 MG tablet, Take 1 tablet (25 mg total) by mouth 2 (two) times daily., Disp: 180 tablet, Rfl: 3   tiZANidine (ZANAFLEX) 2 MG tablet, TAKE 1 TABLET EVERY 6 HOURS AS NEEDED FOR MUSCLE SPASMS, Disp: 360 tablet, Rfl: 3   traMADol (ULTRAM) 50 MG tablet, Take 1 tablet (50 mg total) by mouth every 6 (six) hours as needed., Disp: 120 tablet, Rfl: 2   triamcinolone (NASACORT) 55 MCG/ACT AERO nasal inhaler, Place 2 sprays into the nose daily., Disp: 1 Inhaler, Rfl: 12   vitamin C (ASCORBIC ACID) 500 MG tablet, Take 500 mg by mouth daily., Disp: , Rfl:    zolpidem (AMBIEN) 10 MG tablet, TAKE ONE TABLET AT BEDTIME AS NEEDED FOR SLEEP, Disp: 90 tablet, Rfl: 1   No Known Allergies  Past Medical History:  Diagnosis Date   Aortic stenosis    mild to moderate on echo 02/2021   Bilateral leg cramps 09/01/2015   Essential hypertension 01/07/2010   Qualifier: Diagnosis of  By: Linna Darner MD, Gwyndolyn Saxon     GERD (gastroesophageal reflux disease)    H/O: hysterectomy    History of breast cancer 2006  History of colonic polyps    HLD (hyperlipidemia)    Mitral regurgitation 08/05/2015   Personal history of chemotherapy 2006   Left Breast Cancer   Personal history of radiation therapy 2006   Left Breast Cancer   Snoring 03/30/2017     Past Surgical History:  Procedure Laterality Date   ABDOMINAL HYSTERECTOMY     BREAST LUMPECTOMY Left 2006   COLONOSCOPY W/ POLYPECTOMY  2004, 2009   Dr Watt Climes   TUBAL LIGATION      Family History  Problem Relation Age of Onset   Heart attack Father 8   Hypertension Mother    Coronary artery disease Mother    Aneurysm Brother    Asthma Other        nephew   Hyperlipidemia Other         nephew   Sudden death Other        nephew   Breast cancer Paternal Grandmother    Colon cancer Neg Hx     Social History   Tobacco Use   Smoking status: Never   Smokeless tobacco: Never  Vaping Use   Vaping Use: Never used  Substance Use Topics   Alcohol use: Yes    Alcohol/week: 2.0 standard drinks    Types: 2 Cans of beer per week    Comment: rarely   Drug use: No    ROS   Objective:   Vitals: BP (!) 167/87 (BP Location: Left Arm)   Pulse (!) 105   Temp 98.8 F (37.1 C) (Oral)   Resp 18   SpO2 96%   Physical Exam Constitutional:      General: She is not in acute distress.    Appearance: Normal appearance. She is well-developed. She is not ill-appearing, toxic-appearing or diaphoretic.  HENT:     Head: Normocephalic and atraumatic.     Right Ear: External ear normal.     Left Ear: External ear normal.     Nose: Congestion and rhinorrhea present.     Mouth/Throat:     Mouth: Mucous membranes are moist.  Eyes:     Extraocular Movements: Extraocular movements intact.     Pupils: Pupils are equal, round, and reactive to light.  Cardiovascular:     Rate and Rhythm: Normal rate and regular rhythm.     Pulses: Normal pulses.     Heart sounds: Normal heart sounds. No murmur heard.   No friction rub. No gallop.  Pulmonary:     Effort: Pulmonary effort is normal. No respiratory distress.     Breath sounds: Normal breath sounds. No stridor. No wheezing, rhonchi or rales.  Skin:    General: Skin is warm and dry.     Findings: No rash.  Neurological:     Mental Status: She is alert and oriented to person, place, and time.     Cranial Nerves: No cranial nerve deficit.     Motor: No weakness.     Coordination: Coordination normal.     Gait: Gait normal.  Psychiatric:        Mood and Affect: Mood normal.        Behavior: Behavior normal.        Thought Content: Thought content normal.        Judgment: Judgment normal.      Assessment and Plan :   PDMP not  reviewed this encounter.  1. Clinical diagnosis of COVID-19   2. Cough   3. Throat pain   4. Essential  hypertension     Will manage for clinical diagnosis of COVID-19.  As she got tested at the residential home and was positive, recommended starting molnupiravir.  Use supportive care otherwise.  Emphasized need for medication compliance with her hypertension.  No signs of an acute encephalopathy.  Suspect of the borderline tachycardia is related to her COVID-19 infection.  Counseled patient on potential for adverse effects with medications prescribed/recommended today, ER and return-to-clinic precautions discussed, patient verbalized understanding.    Jaynee Eagles, PA-C 04/24/21 1216

## 2021-04-24 NOTE — ED Triage Notes (Signed)
Pt states received her 2nd booster on Monday. Developed headache, cough, sore throat, and congestion on Wednesday. States had a positive home covid test.

## 2021-04-24 NOTE — Discharge Instructions (Addendum)

## 2021-04-25 DIAGNOSIS — Z20822 Contact with and (suspected) exposure to covid-19: Secondary | ICD-10-CM | POA: Diagnosis not present

## 2021-04-25 LAB — NOVEL CORONAVIRUS, NAA: SARS-CoV-2, NAA: DETECTED — AB

## 2021-04-25 LAB — SARS-COV-2, NAA 2 DAY TAT

## 2021-05-06 ENCOUNTER — Encounter: Payer: Self-pay | Admitting: Internal Medicine

## 2021-05-07 ENCOUNTER — Other Ambulatory Visit: Payer: Self-pay

## 2021-05-07 MED ORDER — ALBUTEROL SULFATE HFA 108 (90 BASE) MCG/ACT IN AERS: 2.0000 | INHALATION_SPRAY | Freq: Four times a day (QID) | RESPIRATORY_TRACT | 2 refills | Status: DC | PRN

## 2021-05-11 ENCOUNTER — Telehealth: Payer: Self-pay | Admitting: Emergency Medicine

## 2021-05-11 NOTE — Telephone Encounter (Signed)
   Patient calling to report she is SOB daily  Call transferred to [facility]

## 2021-05-11 NOTE — Telephone Encounter (Signed)
Pt called access nurse 05/11/2021 at 2:24 pm. Transferred from office, no appt today. Pt is negative Covid but was positive for Covid about a month ago. Pt is still having some SOB, no other symptoms. Recommended to go to ED now. Pt understood but disagreed. Pt has not gone to ER yet according to chart. Thanks.

## 2021-05-11 NOTE — Telephone Encounter (Signed)
Ok for rov if pt wants, thanks

## 2021-05-12 NOTE — Telephone Encounter (Signed)
noted 

## 2021-05-14 ENCOUNTER — Encounter: Payer: Self-pay | Admitting: Internal Medicine

## 2021-05-14 ENCOUNTER — Ambulatory Visit (INDEPENDENT_AMBULATORY_CARE_PROVIDER_SITE_OTHER): Payer: Medicare (Managed Care) | Admitting: Internal Medicine

## 2021-05-14 ENCOUNTER — Other Ambulatory Visit: Payer: Self-pay

## 2021-05-14 ENCOUNTER — Ambulatory Visit (INDEPENDENT_AMBULATORY_CARE_PROVIDER_SITE_OTHER): Payer: Medicare (Managed Care)

## 2021-05-14 VITALS — BP 140/78 | HR 86 | Temp 98.3°F | Ht 68.0 in | Wt 178.0 lb

## 2021-05-14 DIAGNOSIS — R0609 Other forms of dyspnea: Secondary | ICD-10-CM | POA: Insufficient documentation

## 2021-05-14 DIAGNOSIS — I10 Essential (primary) hypertension: Secondary | ICD-10-CM

## 2021-05-14 DIAGNOSIS — R0600 Dyspnea, unspecified: Secondary | ICD-10-CM

## 2021-05-14 DIAGNOSIS — Z1159 Encounter for screening for other viral diseases: Secondary | ICD-10-CM | POA: Diagnosis not present

## 2021-05-14 DIAGNOSIS — E559 Vitamin D deficiency, unspecified: Secondary | ICD-10-CM | POA: Diagnosis not present

## 2021-05-14 DIAGNOSIS — Z20828 Contact with and (suspected) exposure to other viral communicable diseases: Secondary | ICD-10-CM | POA: Diagnosis not present

## 2021-05-14 LAB — HEPATIC FUNCTION PANEL
ALT: 19 U/L (ref 0–35)
AST: 16 U/L (ref 0–37)
Albumin: 4.2 g/dL (ref 3.5–5.2)
Alkaline Phosphatase: 60 U/L (ref 39–117)
Bilirubin, Direct: 0.1 mg/dL (ref 0.0–0.3)
Total Bilirubin: 0.4 mg/dL (ref 0.2–1.2)
Total Protein: 7.7 g/dL (ref 6.0–8.3)

## 2021-05-14 LAB — BASIC METABOLIC PANEL
BUN: 16 mg/dL (ref 6–23)
CO2: 29 mEq/L (ref 19–32)
Calcium: 9.5 mg/dL (ref 8.4–10.5)
Chloride: 104 mEq/L (ref 96–112)
Creatinine, Ser: 0.99 mg/dL (ref 0.40–1.20)
GFR: 58.76 mL/min — ABNORMAL LOW (ref >60.00)
Glucose, Bld: 93 mg/dL (ref 70–99)
Potassium: 4.1 mEq/L (ref 3.5–5.1)
Sodium: 141 mEq/L (ref 135–145)

## 2021-05-14 LAB — CBC WITH DIFFERENTIAL/PLATELET
Basophils Absolute: 0 10*3/uL (ref 0.0–0.1)
Basophils Relative: 0.6 % (ref 0.0–3.0)
Eosinophils Absolute: 0.1 10*3/uL (ref 0.0–0.7)
Eosinophils Relative: 2.1 % (ref 0.0–5.0)
HCT: 38.3 % (ref 36.0–46.0)
Hemoglobin: 12.7 g/dL (ref 12.0–15.0)
Lymphocytes Relative: 19.9 % (ref 12.0–46.0)
Lymphs Abs: 1.3 10*3/uL (ref 0.7–4.0)
MCHC: 33.1 g/dL (ref 30.0–36.0)
MCV: 89.3 fl (ref 78.0–100.0)
Monocytes Absolute: 0.6 10*3/uL (ref 0.1–1.0)
Monocytes Relative: 9.5 % (ref 3.0–12.0)
Neutro Abs: 4.6 10*3/uL (ref 1.4–7.7)
Neutrophils Relative %: 67.9 % (ref 43.0–77.0)
Platelets: 342 10*3/uL (ref 150.0–400.0)
RBC: 4.29 Mil/uL (ref 3.87–5.11)
RDW: 15.8 % — ABNORMAL HIGH (ref 11.5–15.5)
WBC: 6.7 10*3/uL (ref 4.0–10.5)

## 2021-05-14 LAB — BRAIN NATRIURETIC PEPTIDE: Pro B Natriuretic peptide (BNP): 10 pg/mL (ref 0.0–100.0)

## 2021-05-14 LAB — D-DIMER, QUANTITATIVE: D-Dimer, Quant: 0.62 mcg/mL FEU — ABNORMAL HIGH (ref <0.50)

## 2021-05-14 NOTE — Progress Notes (Signed)
Patient ID: Laura Poole, female   DOB: Mar 23, 1951, 70 y.o.   MRN: [id number]        Chief Complaint: follow up recent covid infection       HPI:  Laura Poole is a 70 y.o. female here with recent covid infection diagnosed July 15; granddaughter works at [employer] facility apparently from a resident there;  incidentally had her booster shot #2 just prior to onset symptoms.  Now s/p paxlovid and symptoms essentially resolved now except for some fatigue but also sob/doe.  Lowest o2 sat was 89% and fortunately did not get any worse, in fact now improved today.  Tightness improved.  But still at lesat mild to mod doe.  Pt denies chest pain, orthopnea, PND, increased LE swelling, palpitations, dizziness or syncope.  No prior hx of DVT/PT Not on anticoagulant, but is taking asa.  Inhaler just not helping much as at all.  Taking Vit D  Also cymbalta makes her feel zombie like, though does help the feet symptoms it was intended, so only takes off and on.    Wt Readings from Last 3 Encounters:  05/14/21 178 lb (80.7 kg)  03/19/21 179 lb 3.2 oz (81.3 kg)  01/26/21 184 lb (83.5 kg)   BP Readings from Last 3 Encounters:  05/14/21 140/78  04/24/21 (!) 167/87  03/19/21 124/80         Past Medical History:  Diagnosis Date   Aortic stenosis    mild to moderate on echo 02/2021   Bilateral leg cramps 09/01/2015   Essential hypertension 01/07/2010   Qualifier: Diagnosis of  By: Linna Darner MD, Gwyndolyn Saxon     GERD (gastroesophageal reflux disease)    H/O: hysterectomy    History of breast cancer 2006   History of colonic polyps    HLD (hyperlipidemia)    Mitral regurgitation 08/05/2015   Personal history of chemotherapy 2006   Left Breast Cancer   Personal history of radiation therapy 2006   Left Breast Cancer   Snoring 03/30/2017   Past Surgical History:  Procedure Laterality Date   ABDOMINAL HYSTERECTOMY     BREAST LUMPECTOMY Left 2006   COLONOSCOPY W/ POLYPECTOMY  2004, 2009   Dr Watt Climes   TUBAL  LIGATION      reports that she has never smoked. She has never used smokeless tobacco. She reports current alcohol use of about 2.0 standard drinks of alcohol per week. She reports that she does not use drugs. family history includes Aneurysm in her brother; Asthma in an other family member; Breast cancer in her paternal grandmother; Coronary artery disease in her mother; Heart attack (age of onset: 30) in her father; Hyperlipidemia in an other family member; Hypertension in her mother; Sudden death in an other family member. No Known Allergies Current Outpatient Medications on File Prior to Visit  Medication Sig Dispense Refill   acetaminophen (TYLENOL) 500 MG tablet Take 500 mg by mouth every 6 (six) hours as needed for mild pain or headache.     albuterol (VENTOLIN HFA) 108 (90 Base) MCG/ACT inhaler Inhale 2 puffs into the lungs every 6 (six) hours as needed for wheezing or shortness of breath. 8 g 2   amLODipine (NORVASC) 10 MG tablet TAKE 1 TABLET DAILY 90 tablet 2   aspirin 81 MG tablet Take 1 tablet (81 mg total) by mouth daily. 30 tablet 6   COVID-19 At Home Antigen Test KIT Use as directed 4 kit 0   COVID-19 mRNA vaccine, Moderna, 100 MCG/0.5ML  injection Inject into the muscle. 0.25 mL 0   diphenhydrAMINE (BENADRYL) 25 mg capsule Take 25 mg by mouth every 6 (six) hours as needed for allergies.     DULoxetine (CYMBALTA) 60 MG capsule Take 1 capsule (60 mg total) by mouth daily. 90 capsule 3   gabapentin (NEURONTIN) 100 MG capsule Take 2 capsules (200 mg total) by mouth at bedtime. 180 capsule 1   losartan (COZAAR) 50 MG tablet Take 1 tablet (50 mg total) by mouth daily. 90 tablet 3   meloxicam (MOBIC) 7.5 MG tablet Take 1 tablet (7.5 mg total) by mouth daily. 90 tablet 3   pantoprazole (PROTONIX) 40 MG tablet Take 1 tablet (40 mg total) by mouth daily. 90 tablet 3   promethazine-dextromethorphan (PROMETHAZINE-DM) 6.25-15 MG/5ML syrup Take 5 mLs by mouth at bedtime as needed for cough. 100  mL 0   rosuvastatin (CRESTOR) 10 MG tablet Take 1 tablet (10 mg total) by mouth daily. 90 tablet 3   solifenacin (VESICARE) 5 MG tablet Take 1 tablet (5 mg total) by mouth daily. 90 tablet 3   spironolactone (ALDACTONE) 25 MG tablet Take 1 tablet (25 mg total) by mouth 2 (two) times daily. 180 tablet 3   tiZANidine (ZANAFLEX) 2 MG tablet TAKE 1 TABLET EVERY 6 HOURS AS NEEDED FOR MUSCLE SPASMS 360 tablet 3   traMADol (ULTRAM) 50 MG tablet Take 1 tablet (50 mg total) by mouth every 6 (six) hours as needed. 120 tablet 2   triamcinolone (NASACORT) 55 MCG/ACT AERO nasal inhaler Place 2 sprays into the nose daily. 1 Inhaler 12   vitamin C (ASCORBIC ACID) 500 MG tablet Take 500 mg by mouth daily.     zolpidem (AMBIEN) 10 MG tablet TAKE ONE TABLET AT BEDTIME AS NEEDED FOR SLEEP 90 tablet 1   benzonatate (TESSALON) 100 MG capsule Take 1-2 capsules (100-200 mg total) by mouth 3 (three) times daily as needed for cough. (Patient not taking: Reported on 05/14/2021) 60 capsule 0   No current facility-administered medications on file prior to visit.        ROS:  All others reviewed and negative.  Objective        PE:  BP 140/78 (BP Location: Left Arm, Patient Position: Sitting, Cuff Size: Normal)   Pulse 86   Temp 98.3 F (36.8 C) (Oral)   Ht '5\' 8"'  (1.727 m)   Wt 178 lb (80.7 kg)   SpO2 98%   BMI 27.06 kg/m                 Constitutional: Pt appears in NAD               HENT: Head: NCAT.                Right Ear: External ear normal.                 Left Ear: External ear normal.                Eyes: . Pupils are equal, round, and reactive to light. Conjunctivae and EOM are normal               Nose: without d/c or deformity               Neck: Neck supple. Gross normal ROM               Cardiovascular: Normal rate and regular rhythm.  Pulmonary/Chest: Effort normal and breath sounds without rales or wheezing.                Abd:  Soft, NT, ND, + BS, no organomegaly                Neurological: Pt is alert. At baseline orientation, motor grossly intact               Skin: Skin is warm. No rashes, no other new lesions, LE edema - none               Psychiatric: Pt behavior is normal without agitation   Micro: none  Cardiac tracings I have personally interpreted today:  none  Pertinent Radiological findings (summarize): none   Lab Results  Component Value Date   WBC 6.7 05/14/2021   HGB 12.7 05/14/2021   HCT 38.3 05/14/2021   PLT 342.0 05/14/2021   GLUCOSE 93 05/14/2021   CHOL 175 12/01/2020   TRIG 120.0 12/01/2020   HDL 66.10 12/01/2020   LDLCALC 85 12/01/2020   ALT 19 05/14/2021   AST 16 05/14/2021   NA 141 05/14/2021   K 4.1 05/14/2021   CL 104 05/14/2021   CREATININE 0.99 05/14/2021   BUN 16 05/14/2021   CO2 29 05/14/2021   TSH 1.60 12/01/2020   INR 1.1 (H) 01/25/2012   HGBA1C 6.5 03/19/2019   Assessment/Plan:  Laura Poole is a 70 y.o. Black or African American [2] female with  has a past medical history of Aortic stenosis, Bilateral leg cramps (09/01/2015), Essential hypertension (01/07/2010), GERD (gastroesophageal reflux disease), H/O: hysterectomy, History of breast cancer (2006), History of colonic polyps, HLD (hyperlipidemia), Mitral regurgitation (08/05/2015), Personal history of chemotherapy (2006), Personal history of radiation therapy (2006), and Snoring (03/30/2017).  DOE (dyspnea on exertion) Etiology unclear, now for cxr, d diemr and consdier cta chest, to f/u any worsening symptoms or concerns  Vitamin D deficiency Last vitamin D Lab Results  Component Value Date   VD25OH 38.96 12/01/2020   Stable, cont oral replacement  Uncontrolled hypertension BP Readings from Last 3 Encounters:  05/14/21 140/78  04/24/21 (!) 167/87  03/19/21 124/80   Stable, pt to continue medical treatment losartan, norvasc, aldactone  Followup: Return if symptoms worsen or fail to improve.  Cathlean Cower, MD 05/16/2021 11:13 PM [facility]

## 2021-05-14 NOTE — Patient Instructions (Signed)
Please continue all other medications as before, and refills have been done if requested.  Please have the pharmacy call with any other refills you may need.  Please continue your efforts at being more active, low cholesterol diet, and weight control.  Please keep your appointments with your specialists as you may have planned  Please go to the XRAY Department in the first floor for the x-ray testing  Please go to the LAB at the blood drawing area for the tests to be done  If the D dimer is increased, we may need to do a CTA chest (CT scan of the chest)

## 2021-05-16 ENCOUNTER — Encounter: Payer: Self-pay | Admitting: Internal Medicine

## 2021-05-16 NOTE — Assessment & Plan Note (Signed)
Last vitamin D Lab Results  Component Value Date   VD25OH 38.96 12/01/2020   Stable, cont oral replacement

## 2021-05-16 NOTE — Assessment & Plan Note (Signed)
BP Readings from Last 3 Encounters:  05/14/21 140/78  04/24/21 (!) 167/87  03/19/21 124/80   Stable, pt to continue medical treatment losartan, norvasc, aldactone

## 2021-05-16 NOTE — Assessment & Plan Note (Signed)
Etiology unclear, now for cxr, d diemr and consdier cta chest, to f/u any worsening symptoms or concerns

## 2021-05-20 ENCOUNTER — Telehealth: Payer: Self-pay | Admitting: Internal Medicine

## 2021-05-20 DIAGNOSIS — G4700 Insomnia, unspecified: Secondary | ICD-10-CM

## 2021-05-20 MED ORDER — ZOLPIDEM TARTRATE 10 MG PO TABS: ORAL_TABLET | ORAL | 1 refills | Status: DC

## 2021-05-20 NOTE — Telephone Encounter (Signed)
1.Medication Requested: zolpidem (AMBIEN) 10 MG tablet   2. Pharmacy (Name, Street, [place]): [facility], [place]  Phone:  [phone] Fax:  [phone]   3. On Med List: yes  4. Last Visit with PCP: 08.04.22  5. Next visit date with PCP: 08.22.22   Agent: Please be advised that RX refills may take up to 3 business days. We ask that you follow-up with your pharmacy.

## 2021-06-01 ENCOUNTER — Ambulatory Visit: Payer: Medicare (Managed Care) | Admitting: Internal Medicine

## 2021-06-04 ENCOUNTER — Ambulatory Visit: Payer: Medicare (Managed Care) | Admitting: Internal Medicine

## 2021-06-18 ENCOUNTER — Institutional Professional Consult (permissible substitution): Payer: Medicare (Managed Care) | Admitting: Pulmonary Disease

## 2021-07-17 ENCOUNTER — Ambulatory Visit: Payer: Medicare (Managed Care)

## 2021-07-27 ENCOUNTER — Ambulatory Visit (INDEPENDENT_AMBULATORY_CARE_PROVIDER_SITE_OTHER): Payer: Medicare (Managed Care)

## 2021-07-27 ENCOUNTER — Other Ambulatory Visit: Payer: Self-pay

## 2021-07-27 DIAGNOSIS — Z23 Encounter for immunization: Secondary | ICD-10-CM

## 2021-07-28 ENCOUNTER — Ambulatory Visit: Payer: Medicare (Managed Care) | Admitting: Podiatry

## 2021-07-28 ENCOUNTER — Ambulatory Visit (INDEPENDENT_AMBULATORY_CARE_PROVIDER_SITE_OTHER): Payer: Medicare (Managed Care)

## 2021-07-28 ENCOUNTER — Encounter: Payer: Self-pay | Admitting: Podiatry

## 2021-07-28 ENCOUNTER — Telehealth: Payer: Self-pay

## 2021-07-28 DIAGNOSIS — M2041 Other hammer toe(s) (acquired), right foot: Secondary | ICD-10-CM | POA: Diagnosis not present

## 2021-07-28 DIAGNOSIS — G629 Polyneuropathy, unspecified: Secondary | ICD-10-CM

## 2021-07-28 DIAGNOSIS — M2042 Other hammer toe(s) (acquired), left foot: Secondary | ICD-10-CM

## 2021-07-28 MED ORDER — GABAPENTIN 300 MG PO CAPS: 300.0000 mg | ORAL_CAPSULE | Freq: Every day | ORAL | 1 refills | Status: DC

## 2021-07-28 NOTE — Telephone Encounter (Signed)
Patient notified

## 2021-07-28 NOTE — Telephone Encounter (Signed)
Ok this is done 

## 2021-07-28 NOTE — Telephone Encounter (Signed)
Please advise as the pt has stated she was advised by her foot specialist to contact her PCP for a dosage change to her Gabapentin as it needs to be increased.  **Pt had her appointment today with Dr. Blenda Mounts at [facility].

## 2021-07-28 NOTE — Progress Notes (Signed)
  Subjective:  Patient ID: Laura Poole, female    DOB: 06/08/1951,   MRN: [id number]  Chief Complaint  Patient presents with   Foot Pain    Bilateral foot pain at the top and bottom of both feet. Cold, with tingling and numbness.      70 y.o. female presents for bilateral foot pain. Relates burning and tingling as well as numbness in both feet. Currently on a low dose of gabapentin. Denies she is diabetic but her last A1c was 6.5. Relates occasional back pain. Had previous hammertoe and bunion surgerys in 2018.  [name] Denies any other pedal complaints. Denies n/v/f/c.   PCP: Cathlean Cower MD   Past Medical History:  Diagnosis Date   Aortic stenosis    mild to moderate on echo 02/2021   Bilateral leg cramps 09/01/2015   Essential hypertension 01/07/2010   Qualifier: Diagnosis of  By: Linna Darner MD, Gwyndolyn Saxon     GERD (gastroesophageal reflux disease)    H/O: hysterectomy    History of breast cancer 2006   History of colonic polyps    HLD (hyperlipidemia)    Mitral regurgitation 08/05/2015   Personal history of chemotherapy 2006   Left Breast Cancer   Personal history of radiation therapy 2006   Left Breast Cancer   Snoring 03/30/2017    Objective:  Physical Exam: Vascular: DP/PT pulses 2/4 bilateral. CFT <3 seconds. Normal hair growth on digits. No edema.  Skin. No lacerations or abrasions bilateral feet. Mild edema noted right dorsal foot.  Musculoskeletal: MMT 5/5 bilateral lower extremities in DF, PF, Inversion and Eversion. Deceased ROM in DF of ankle joint. No particular areas of tenderness noted.  Neurological: Sensation intact to light touch.   Assessment:   1. Neuropathy   2. Hammertoe, bilateral      Plan:  Patient was evaluated and treated and all questions answered. Discussed neuropathy and etiology as well as treatment with patient.  Radiographs reviewed and discussed with patient. No acute fractures or dislocations. Retained hardware bilateral for bunion and  hammertoe surgery's. Hammered distal IPJ 2 and 3 on right.  -Discussed and educated patient on foot care, especially with  regards to the vascular, neurological and musculoskeletal systems.  -Discussed supportive shoes at all times and checking feet regularly.  -Follow-up with PCP for prescription management of gabapentin.  -Compression ankle provided for swelling.   -Patient to return as needed.    Lorenda Peck, DPM

## 2021-08-04 ENCOUNTER — Telehealth: Payer: Self-pay | Admitting: Internal Medicine

## 2021-08-04 NOTE — Telephone Encounter (Signed)
Patient does not need any refills at this time, however she received medications from the [facility] today and was instructed to contact her MD and have her refills prepared for next time  Nothing to do at this time  Patient also wanted to tell Dr. Jenny Reichmann that she has been taking her BP meds and her BP is doing well.

## 2021-09-09 DIAGNOSIS — Z0000 Encounter for general adult medical examination without abnormal findings: Secondary | ICD-10-CM | POA: Diagnosis not present

## 2021-09-25 ENCOUNTER — Other Ambulatory Visit: Payer: Self-pay | Admitting: Obstetrics and Gynecology

## 2021-09-25 DIAGNOSIS — Z1231 Encounter for screening mammogram for malignant neoplasm of breast: Secondary | ICD-10-CM

## 2021-10-15 ENCOUNTER — Other Ambulatory Visit: Payer: Self-pay | Admitting: Internal Medicine

## 2021-10-15 ENCOUNTER — Encounter: Payer: Self-pay | Admitting: Internal Medicine

## 2021-10-15 DIAGNOSIS — M810 Age-related osteoporosis without current pathological fracture: Secondary | ICD-10-CM

## 2021-10-15 NOTE — Telephone Encounter (Signed)
Ok I have ordered DXA  Please assist pt with scheduling at [facility]

## 2021-10-15 NOTE — Telephone Encounter (Signed)
Please refill as per office routine med refill policy (all routine meds to be refilled for 3 mo or monthly (per pt preference) up to one year from last visit, then month to month grace period for 3 mo, then further med refills will have to be denied) ? ?

## 2021-10-26 ENCOUNTER — Other Ambulatory Visit: Payer: Self-pay | Admitting: Internal Medicine

## 2021-10-26 DIAGNOSIS — E785 Hyperlipidemia, unspecified: Secondary | ICD-10-CM

## 2021-10-26 DIAGNOSIS — I10 Essential (primary) hypertension: Secondary | ICD-10-CM

## 2021-10-29 ENCOUNTER — Ambulatory Visit: Payer: Medicare (Managed Care)

## 2021-10-29 ENCOUNTER — Ambulatory Visit
Admission: RE | Admit: 2021-10-29 | Discharge: 2021-10-29 | Disposition: A | Discharge status: Home / Self Care | Payer: Medicare (Managed Care) | Source: Ambulatory Visit | Attending: Obstetrics and Gynecology | Admitting: Obstetrics and Gynecology

## 2021-10-29 DIAGNOSIS — Z1231 Encounter for screening mammogram for malignant neoplasm of breast: Secondary | ICD-10-CM

## 2021-10-29 HISTORY — DX: Malignant neoplasm of unspecified site of unspecified female breast: C50.919

## 2021-11-02 ENCOUNTER — Encounter: Payer: Self-pay | Admitting: Internal Medicine

## 2021-11-02 ENCOUNTER — Ambulatory Visit: Payer: Medicare (Managed Care) | Admitting: Internal Medicine

## 2021-11-02 ENCOUNTER — Other Ambulatory Visit: Payer: Self-pay

## 2021-11-02 VITALS — BP 150/82 | HR 80 | Temp 98.2°F | Ht 68.0 in | Wt 189.0 lb

## 2021-11-02 DIAGNOSIS — N6320 Unspecified lump in the left breast, unspecified quadrant: Secondary | ICD-10-CM | POA: Diagnosis not present

## 2021-11-02 DIAGNOSIS — E7800 Pure hypercholesterolemia, unspecified: Secondary | ICD-10-CM | POA: Diagnosis not present

## 2021-11-02 DIAGNOSIS — G4700 Insomnia, unspecified: Secondary | ICD-10-CM

## 2021-11-02 DIAGNOSIS — Z0001 Encounter for general adult medical examination with abnormal findings: Secondary | ICD-10-CM | POA: Diagnosis not present

## 2021-11-02 DIAGNOSIS — E538 Deficiency of other specified B group vitamins: Secondary | ICD-10-CM | POA: Diagnosis not present

## 2021-11-02 DIAGNOSIS — I6523 Occlusion and stenosis of bilateral carotid arteries: Secondary | ICD-10-CM

## 2021-11-02 DIAGNOSIS — R739 Hyperglycemia, unspecified: Secondary | ICD-10-CM | POA: Diagnosis not present

## 2021-11-02 DIAGNOSIS — E785 Hyperlipidemia, unspecified: Secondary | ICD-10-CM

## 2021-11-02 DIAGNOSIS — I10 Essential (primary) hypertension: Secondary | ICD-10-CM

## 2021-11-02 DIAGNOSIS — I350 Nonrheumatic aortic (valve) stenosis: Secondary | ICD-10-CM

## 2021-11-02 DIAGNOSIS — E559 Vitamin D deficiency, unspecified: Secondary | ICD-10-CM | POA: Diagnosis not present

## 2021-11-02 LAB — LIPID PANEL
Cholesterol: 197 mg/dL (ref 0–200)
HDL: 67.2 mg/dL (ref >39.00)
LDL Cholesterol: 106 mg/dL — ABNORMAL HIGH (ref 0–99)
NonHDL: 129.76
Total CHOL/HDL Ratio: 3
Triglycerides: 119 mg/dL (ref 0.0–149.0)
VLDL: 23.8 mg/dL (ref 0.0–40.0)

## 2021-11-02 LAB — URINALYSIS, ROUTINE W REFLEX MICROSCOPIC
Bilirubin Urine: NEGATIVE
Hgb urine dipstick: NEGATIVE
Ketones, ur: NEGATIVE
Leukocytes,Ua: NEGATIVE
Nitrite: NEGATIVE
RBC / HPF: NONE SEEN (ref 0–?)
Specific Gravity, Urine: 1.015 (ref 1.000–1.030)
Total Protein, Urine: NEGATIVE
Urine Glucose: NEGATIVE
Urobilinogen, UA: 0.2 (ref 0.0–1.0)
pH: 7.5 (ref 5.0–8.0)

## 2021-11-02 LAB — HEPATIC FUNCTION PANEL
ALT: 19 U/L (ref 0–35)
AST: 16 U/L (ref 0–37)
Albumin: 4.6 g/dL (ref 3.5–5.2)
Alkaline Phosphatase: 61 U/L (ref 39–117)
Bilirubin, Direct: 0.1 mg/dL (ref 0.0–0.3)
Total Bilirubin: 0.4 mg/dL (ref 0.2–1.2)
Total Protein: 8 g/dL (ref 6.0–8.3)

## 2021-11-02 LAB — CBC WITH DIFFERENTIAL/PLATELET
Basophils Absolute: 0 10*3/uL (ref 0.0–0.1)
Basophils Relative: 0.5 % (ref 0.0–3.0)
Eosinophils Absolute: 0.2 10*3/uL (ref 0.0–0.7)
Eosinophils Relative: 2 % (ref 0.0–5.0)
HCT: 39.9 % (ref 36.0–46.0)
Hemoglobin: 12.8 g/dL (ref 12.0–15.0)
Lymphocytes Relative: 27.4 % (ref 12.0–46.0)
Lymphs Abs: 2.2 10*3/uL (ref 0.7–4.0)
MCHC: 32.2 g/dL (ref 30.0–36.0)
MCV: 90.1 fl (ref 78.0–100.0)
Monocytes Absolute: 0.7 10*3/uL (ref 0.1–1.0)
Monocytes Relative: 8 % (ref 3.0–12.0)
Neutro Abs: 5 10*3/uL (ref 1.4–7.7)
Neutrophils Relative %: 62.1 % (ref 43.0–77.0)
Platelets: 311 10*3/uL (ref 150.0–400.0)
RBC: 4.43 Mil/uL (ref 3.87–5.11)
RDW: 15.2 % (ref 11.5–15.5)
WBC: 8.1 10*3/uL (ref 4.0–10.5)

## 2021-11-02 LAB — BASIC METABOLIC PANEL
BUN: 17 mg/dL (ref 6–23)
CO2: 31 mEq/L (ref 19–32)
Calcium: 10.1 mg/dL (ref 8.4–10.5)
Chloride: 101 mEq/L (ref 96–112)
Creatinine, Ser: 0.91 mg/dL (ref 0.40–1.20)
GFR: 64.8 mL/min (ref >60.00)
Glucose, Bld: 96 mg/dL (ref 70–99)
Potassium: 4.1 mEq/L (ref 3.5–5.1)
Sodium: 141 mEq/L (ref 135–145)

## 2021-11-02 LAB — HEMOGLOBIN A1C: Hgb A1c MFr Bld: 6.2 % (ref 4.6–6.5)

## 2021-11-02 LAB — TSH: TSH: 2.65 u[IU]/mL (ref 0.35–5.50)

## 2021-11-02 LAB — VITAMIN D 25 HYDROXY (VIT D DEFICIENCY, FRACTURES): VITD: 41.45 ng/mL (ref 30.00–100.00)

## 2021-11-02 LAB — VITAMIN B12: Vitamin B-12: >1504 pg/mL — ABNORMAL HIGH (ref 211–911)

## 2021-11-02 MED ORDER — DULOXETINE HCL 60 MG PO CPEP: 60.0000 mg | ORAL_CAPSULE | Freq: Every day | ORAL | 3 refills | Status: DC

## 2021-11-02 MED ORDER — AMLODIPINE BESYLATE 10 MG PO TABS: 10.0000 mg | ORAL_TABLET | Freq: Every day | ORAL | 3 refills | Status: DC

## 2021-11-02 MED ORDER — PANTOPRAZOLE SODIUM 40 MG PO TBEC: 40.0000 mg | DELAYED_RELEASE_TABLET | Freq: Every day | ORAL | 3 refills | Status: DC

## 2021-11-02 MED ORDER — MELOXICAM 7.5 MG PO TABS: 7.5000 mg | ORAL_TABLET | Freq: Every day | ORAL | 3 refills | Status: DC

## 2021-11-02 MED ORDER — GABAPENTIN 300 MG PO CAPS
300.0000 mg | ORAL_CAPSULE | Freq: Every day | ORAL | 1 refills | Status: DC
Start: 2021-11-02 — End: 2022-08-24

## 2021-11-02 MED ORDER — SPIRONOLACTONE 25 MG PO TABS: 25.0000 mg | ORAL_TABLET | Freq: Two times a day (BID) | ORAL | 3 refills | Status: DC

## 2021-11-02 MED ORDER — ALBUTEROL SULFATE HFA 108 (90 BASE) MCG/ACT IN AERS: 2.0000 | INHALATION_SPRAY | Freq: Four times a day (QID) | RESPIRATORY_TRACT | 1 refills | Status: DC | PRN

## 2021-11-02 MED ORDER — ZOLPIDEM TARTRATE 10 MG PO TABS: ORAL_TABLET | ORAL | 1 refills | Status: DC

## 2021-11-02 MED ORDER — LOSARTAN POTASSIUM 50 MG PO TABS: 50.0000 mg | ORAL_TABLET | Freq: Every day | ORAL | 3 refills | Status: DC

## 2021-11-02 MED ORDER — SOLIFENACIN SUCCINATE 5 MG PO TABS: 5.0000 mg | ORAL_TABLET | Freq: Every day | ORAL | 3 refills | Status: DC

## 2021-11-02 MED ORDER — ROSUVASTATIN CALCIUM 10 MG PO TABS: 10.0000 mg | ORAL_TABLET | Freq: Every day | ORAL | 3 refills | Status: DC

## 2021-11-02 NOTE — Progress Notes (Signed)
Patient ID: Laura Poole, female   DOB: [birth year], 71 y.o.   MRN: [id number]         Chief Complaint:: wellness exam and Office Visit (Patient states that she felt a lump in her left breast)         HPI:  Laura Poole is a 71 y.o. female here for wellness exam; declines covid booster, shingrix, now for DXA,  o/w up to date                        Also BP at home has been < 140/90 per pt, Pt denies chest pain, increased sob or doe, wheezing, orthopnea, PND, increased LE swelling, palpitations, dizziness or syncope.   Pt denies polydipsia, polyuria, or new focal neuro s/s.   Pt denies fever, wt loss, night sweats, loss of appetite, or other constitutional symptoms  Did notice 1 day hx of lump nontender to left breast upper medial quadrant, just had screening mammogram yesterday negative.  Also due for cardiology f/u related to AS but needs new cardiologist   Wt Readings from Last 3 Encounters:  11/02/21 189 lb (85.7 kg)  05/14/21 178 lb (80.7 kg)  03/19/21 179 lb 3.2 oz (81.3 kg)   BP Readings from Last 3 Encounters:  11/02/21 (!) 150/82  05/14/21 140/78  04/24/21 (!) 167/87   Immunization History  Administered Date(s) Administered   Fluad Quad(high Dose 65+) 06/15/2019, 08/04/2020, 07/27/2021   Influenza Split 08/09/2012   Influenza, High Dose Seasonal PF 07/29/2017, 06/27/2018   Influenza-Unspecified 07/11/2013   Moderna SARS-COV2 Booster Vaccination 08/07/2020, 03/19/2021, 04/20/2021   Moderna Sars-Covid-2 Vaccination 10/26/2019, 11/23/2019   Pneumococcal Conjugate-13 06/27/2018   Pneumococcal Polysaccharide-23 01/09/2020   Tetanus 12/04/2012   Zoster, Live 05/20/2016   There are no preventive care reminders to display for this patient.     Past Medical History:  Diagnosis Date   Aortic stenosis    mild to moderate on echo 02/2021   Bilateral leg cramps 09/01/2015   Breast cancer ([place])    left   Essential hypertension 01/07/2010   Qualifier: Diagnosis of  By: Linna Darner  MD, Gwyndolyn Saxon     GERD (gastroesophageal reflux disease)    H/O: hysterectomy    History of breast cancer 2006   History of colonic polyps    HLD (hyperlipidemia)    Mitral regurgitation 08/05/2015   Personal history of chemotherapy 2006   Left Breast Cancer   Personal history of radiation therapy 2006   Left Breast Cancer   Snoring 03/30/2017   Past Surgical History:  Procedure Laterality Date   ABDOMINAL HYSTERECTOMY     BREAST LUMPECTOMY Left 2006   COLONOSCOPY W/ POLYPECTOMY  2004, 2009   Dr Watt Climes   TUBAL LIGATION      reports that she has never smoked. She has never used smokeless tobacco. She reports current alcohol use of about 2.0 standard drinks per week. She reports that she does not use drugs. family history includes Aneurysm in her brother; Asthma in an other family member; Breast cancer in her paternal grandmother; Coronary artery disease in her mother; Heart attack (age of onset: 47) in her father; Hyperlipidemia in an other family member; Hypertension in her mother; Sudden death in an other family member. No Known Allergies Current Outpatient Medications on File Prior to Visit  Medication Sig Dispense Refill   acetaminophen (TYLENOL) 500 MG tablet Take 500 mg by mouth every 6 (six) hours as needed for mild pain or  headache.     aspirin 81 MG tablet Take 1 tablet (81 mg total) by mouth daily. 30 tablet 6   diphenhydrAMINE (BENADRYL) 25 mg capsule Take 25 mg by mouth every 6 (six) hours as needed for allergies.     promethazine-dextromethorphan (PROMETHAZINE-DM) 6.25-15 MG/5ML syrup Take 5 mLs by mouth at bedtime as needed for cough. 100 mL 0   tiZANidine (ZANAFLEX) 2 MG tablet TAKE 1 TABLET EVERY 6 HOURS AS NEEDED FOR MUSCLE SPASMS 360 tablet 3   traMADol (ULTRAM) 50 MG tablet Take 1 tablet (50 mg total) by mouth every 6 (six) hours as needed. 120 tablet 2   triamcinolone (NASACORT) 55 MCG/ACT AERO nasal inhaler Place 2 sprays into the nose daily. 1 Inhaler 12   vitamin C  (ASCORBIC ACID) 500 MG tablet Take 500 mg by mouth daily.     benzonatate (TESSALON) 100 MG capsule Take 1-2 capsules (100-200 mg total) by mouth 3 (three) times daily as needed for cough. (Patient not taking: Reported on 05/14/2021) 60 capsule 0   No current facility-administered medications on file prior to visit.        ROS:  All others reviewed and negative.  Objective        PE:  BP (!) 150/82 (BP Location: Right Arm, Patient Position: Sitting, Cuff Size: Normal)    Pulse 80    Temp 98.2 F (36.8 C) (Oral)    Ht 5\' 8"  (1.727 m)    Wt 189 lb (85.7 kg)    SpO2 100%    BMI 28.74 kg/m                 Constitutional: Pt appears in NAD               HENT: Head: NCAT.                Right Ear: External ear normal.                 Left Ear: External ear normal.                Eyes: . Pupils are equal, round, and reactive to light. Conjunctivae and EOM are normal               Nose: without d/c or deformity               Neck: Neck supple. Gross normal ROM               Cardiovascular: Normal rate and regular rhythm.                 Pulmonary/Chest: Effort normal and breath sounds without rales or wheezing.   Left breast with upper medial quadrant sub1 nodule approx 1 cm nontender, no axillary LA, no overlyilng skin change               Abd:  Soft, NT, ND, + BS, no organomegaly               Neurological: Pt is alert. At baseline orientation, motor grossly intact               Skin: Skin is warm. No rashes, no other new lesions, LE edema - none               Psychiatric: Pt behavior is normal without agitation   Micro: none  Cardiac tracings I have personally interpreted today:  none  Pertinent Radiological findings (summarize): none  Lab Results  Component Value Date   WBC 8.1 11/02/2021   HGB 12.8 11/02/2021   HCT 39.9 11/02/2021   PLT 311.0 11/02/2021   GLUCOSE 96 11/02/2021   CHOL 197 11/02/2021   TRIG 119.0 11/02/2021   HDL 67.20 11/02/2021   LDLCALC 106 (H) 11/02/2021    ALT 19 11/02/2021   AST 16 11/02/2021   NA 141 11/02/2021   K 4.1 11/02/2021   CL 101 11/02/2021   CREATININE 0.91 11/02/2021   BUN 17 11/02/2021   CO2 31 11/02/2021   TSH 2.65 11/02/2021   INR 1.1 (H) 01/25/2012   HGBA1C 6.2 11/02/2021   Assessment/Plan:  Laura Poole is a 71 y.o. Black or African American [2] female with  has a past medical history of Aortic stenosis, Bilateral leg cramps (09/01/2015), Breast cancer ([place]), Essential hypertension (01/07/2010), GERD (gastroesophageal reflux disease), H/O: hysterectomy, History of breast cancer (2006), History of colonic polyps, HLD (hyperlipidemia), Mitral regurgitation (08/05/2015), Personal history of chemotherapy (2006), Personal history of radiation therapy (2006), and Snoring (03/30/2017).  Aortic stenosis Pt due for cardiology f/u and echo - will refer  Encounter for well adult exam with abnormal findings Age and sex appropriate education and counseling updated with regular exercise and diet Referrals for preventative services - for DXA Immunizations addressed - declines covid booster, shingrix Smoking counseling  - none needed Evidence for depression or other mood disorder - none significant Most recent labs reviewed. I have personally reviewed and have noted: 1) the patient's medical and social history 2) The patient's current medications and supplements 3) The patient's height, weight, and BMI have been recorded in the chart   B12 deficiency Lab Results  Component Value Date   VITAMINB12 >1504 (H) 11/02/2021   Stable, cont oral replacement - b12 1000 mcg qd   Hyperlipidemia Lab Results  Component Value Date   LDLCALC 106 (H) 11/02/2021   Uncontrolled, , pt to restart statin crestor 10 mg   Left breast lump Also for diagnostic mammogram  Uncontrolled hypertension BP Readings from Last 3 Encounters:  11/02/21 (!) 150/82  05/14/21 140/78  04/24/21 (!) 167/87   uncontrolled, pt to continue medical  treatment norvasc, losartan, aldactone as pt declines change   Vitamin D deficiency Last vitamin D Lab Results  Component Value Date   VD25OH 41.45 11/02/2021   Stable, cont oral replacement   Insomnia Stable, continue current ambien  Followup: Return in about 1 year (around 11/02/2022).  Cathlean Cower, MD 11/08/2021 4:11 PM [facility]

## 2021-11-02 NOTE — Patient Instructions (Addendum)
You will be contacted regarding the referral for: diagnostic mammogram (with ultrasound) at [facility] will be contacted regarding the referral for: Dr Turner/cardiology  I have re-orderded the Echo as well  Please schedule the bone density test before leaving today at the scheduling desk (where you check out)  Please continue all other medications as before, and refills have been done if requested.  Please have the pharmacy call with any other refills you may need.  Please continue your efforts at being more active, low cholesterol diet, and weight control.  You are otherwise up to date with prevention measures today.  Please keep your appointments with your specialists as you may have planned  Please go to the LAB at the blood drawing area for the tests to be done  You will be contacted by phone if any changes need to be made immediately.  Otherwise, you will receive a letter about your results with an explanation, but please check with MyChart first.  Please remember to sign up for MyChart if you have not done so, as this will be important to you in the future with finding out test results, communicating by private email, and scheduling acute appointments online when needed.  Please make an Appointment to return for your 1 year visit, or sooner if needed

## 2021-11-08 ENCOUNTER — Encounter: Payer: Self-pay | Admitting: Internal Medicine

## 2021-11-08 NOTE — Assessment & Plan Note (Signed)
Lab Results  Component Value Date   VITAMINB12 >1504 (H) 11/02/2021   Stable, cont oral replacement - b12 1000 mcg qd

## 2021-11-08 NOTE — Assessment & Plan Note (Signed)
Pt due for cardiology f/u and echo - will refer

## 2021-11-08 NOTE — Assessment & Plan Note (Addendum)
Age and sex appropriate education and counseling updated with regular exercise and diet Referrals for preventative services - for DXA Immunizations addressed - declines covid booster, shingrix Smoking counseling  - none needed Evidence for depression or other mood disorder - none significant Most recent labs reviewed. I have personally reviewed and have noted: 1) the patient's medical and social history 2) The patient's current medications and supplements 3) The patient's height, weight, and BMI have been recorded in the chart

## 2021-11-08 NOTE — Assessment & Plan Note (Addendum)
Lab Results  Component Value Date   LDLCALC 106 (H) 11/02/2021   Uncontrolled, , pt to restart statin crestor 10 mg

## 2021-11-08 NOTE — Assessment & Plan Note (Signed)
Last vitamin D Lab Results  Component Value Date   VD25OH 41.45 11/02/2021   Stable, cont oral replacement

## 2021-11-08 NOTE — Assessment & Plan Note (Signed)
Also for diagnostic mammogram

## 2021-11-08 NOTE — Assessment & Plan Note (Signed)
Stable, continue current [place]

## 2021-11-08 NOTE — Assessment & Plan Note (Signed)
BP Readings from Last 3 Encounters:  11/02/21 (!) 150/82  05/14/21 140/78  04/24/21 (!) 167/87   uncontrolled, pt to continue medical treatment norvasc, losartan, aldactone as pt declines change

## 2021-11-17 ENCOUNTER — Ambulatory Visit (INDEPENDENT_AMBULATORY_CARE_PROVIDER_SITE_OTHER)
Admission: RE | Admit: 2021-11-17 | Discharge: 2021-11-17 | Disposition: A | Discharge status: Home / Self Care | Payer: Medicare (Managed Care) | Source: Ambulatory Visit | Attending: Internal Medicine | Admitting: Internal Medicine

## 2021-11-17 ENCOUNTER — Other Ambulatory Visit: Payer: Self-pay

## 2021-11-17 DIAGNOSIS — M810 Age-related osteoporosis without current pathological fracture: Secondary | ICD-10-CM

## 2021-11-24 ENCOUNTER — Other Ambulatory Visit: Payer: Self-pay

## 2021-11-24 ENCOUNTER — Ambulatory Visit
Admission: RE | Admit: 2021-11-24 | Discharge: 2021-11-24 | Disposition: A | Discharge status: Home / Self Care | Payer: Medicare (Managed Care) | Source: Ambulatory Visit | Attending: Internal Medicine | Admitting: Internal Medicine

## 2021-11-24 ENCOUNTER — Other Ambulatory Visit: Payer: Self-pay | Admitting: Internal Medicine

## 2021-11-24 DIAGNOSIS — N6320 Unspecified lump in the left breast, unspecified quadrant: Secondary | ICD-10-CM

## 2021-11-24 DIAGNOSIS — Z853 Personal history of malignant neoplasm of breast: Secondary | ICD-10-CM | POA: Diagnosis not present

## 2021-11-24 DIAGNOSIS — R922 Inconclusive mammogram: Secondary | ICD-10-CM | POA: Diagnosis not present

## 2021-12-09 ENCOUNTER — Ambulatory Visit
Admission: RE | Admit: 2021-12-09 | Discharge: 2021-12-09 | Disposition: A | Discharge status: Home / Self Care | Payer: Medicare (Managed Care) | Source: Ambulatory Visit | Attending: Internal Medicine | Admitting: Internal Medicine

## 2021-12-09 DIAGNOSIS — R928 Other abnormal and inconclusive findings on diagnostic imaging of breast: Secondary | ICD-10-CM | POA: Diagnosis not present

## 2021-12-09 DIAGNOSIS — N6320 Unspecified lump in the left breast, unspecified quadrant: Secondary | ICD-10-CM

## 2021-12-22 ENCOUNTER — Ambulatory Visit: Payer: Medicare (Managed Care)

## 2021-12-25 ENCOUNTER — Other Ambulatory Visit: Payer: Self-pay

## 2021-12-25 ENCOUNTER — Other Ambulatory Visit (HOSPITAL_BASED_OUTPATIENT_CLINIC_OR_DEPARTMENT_OTHER): Payer: Self-pay

## 2021-12-25 ENCOUNTER — Ambulatory Visit: Payer: Medicare (Managed Care) | Attending: Internal Medicine

## 2021-12-25 DIAGNOSIS — Z23 Encounter for immunization: Secondary | ICD-10-CM

## 2021-12-25 MED ORDER — MODERNA COVID-19 BIVAL BOOSTER 50 MCG/0.5ML IM SUSP
INTRAMUSCULAR | 0 refills | Status: DC
Start: 2021-12-25 — End: 2023-08-18
  Filled 2021-12-25: qty 0.5, 1d supply, fill #0

## 2021-12-25 NOTE — Progress Notes (Signed)
? ?  Covid-19 Vaccination Clinic ? ?Name:  Laura Poole    ?MRN: [id number] ?DOB: [birth year] ? ?12/25/2021 ? ?[name] was observed post Covid-19 immunization for 15 minutes without incident. She was provided with Vaccine Information Sheet and instruction to access the V-Safe system.  ? ?[name] was instructed to call 911 with any severe reactions post vaccine: ?Difficulty breathing  ?Swelling of face and throat  ?A fast heartbeat  ?A bad rash all over body  ?Dizziness and weakness  ? ?Immunizations Administered   ? ? Name Date Dose VIS Date Route  ? Moderna Covid-19 vaccine Bivalent Booster 12/25/2021  9:40 AM 0.5 mL 05/23/2021 Intramuscular  ? Manufacturer: Moderna  ? Lot: [id number]  ? [place]man: [id number]  ? ?  ? ? ?

## 2022-02-23 ENCOUNTER — Encounter: Payer: Self-pay | Admitting: Cardiology

## 2022-02-23 ENCOUNTER — Telehealth: Payer: Self-pay

## 2022-02-23 ENCOUNTER — Ambulatory Visit (INDEPENDENT_AMBULATORY_CARE_PROVIDER_SITE_OTHER): Payer: Medicare (Managed Care) | Admitting: Cardiology

## 2022-02-23 ENCOUNTER — Ambulatory Visit (HOSPITAL_COMMUNITY): Payer: Medicare (Managed Care) | Attending: Internal Medicine

## 2022-02-23 VITALS — BP 148/90 | HR 86 | Ht 68.0 in | Wt 195.2 lb

## 2022-02-23 DIAGNOSIS — I350 Nonrheumatic aortic (valve) stenosis: Secondary | ICD-10-CM

## 2022-02-23 DIAGNOSIS — R600 Localized edema: Secondary | ICD-10-CM

## 2022-02-23 DIAGNOSIS — I10 Essential (primary) hypertension: Secondary | ICD-10-CM | POA: Diagnosis not present

## 2022-02-23 DIAGNOSIS — I340 Nonrheumatic mitral (valve) insufficiency: Secondary | ICD-10-CM | POA: Diagnosis not present

## 2022-02-23 LAB — ECHOCARDIOGRAM COMPLETE
AR max vel: 1.38 cm2
AV Area VTI: 1.41 cm2
AV Area mean vel: 1.42 cm2
AV Mean grad: 7 mmHg
AV Peak grad: 14 mmHg
Ao pk vel: 1.87 m/s
Area-P 1/2: 3.05 cm2
Height: 68 in
P 1/2 time: 506 msec
S' Lateral: 2.3 cm
Weight: 3123.2 oz

## 2022-02-23 MED ORDER — METOPROLOL SUCCINATE ER 50 MG PO TB24: 50.0000 mg | ORAL_TABLET | Freq: Every day | ORAL | 3 refills | Status: DC

## 2022-02-23 NOTE — Telephone Encounter (Signed)
-----   Message from Sueanne Margarita, MD sent at 02/23/2022  2:02 PM EDT ----- ?Echo showed normal heart function with moderate thickening of the heart muscle moderate leakiness of the mitral valve which is increased from mild a year ago.  Please repeat 2D echo in 1 year for follow-up MR ?

## 2022-02-23 NOTE — Progress Notes (Signed)
? ?    ? ?Date:  05/23/2020  ? ?ID:  [name] ?DOB 12-Dec-1950 ?MRN [id number] ? ?Cardiology Office Note   ? ?Date:  02/23/2022  ? ?ID:  Laura Poole, DOB 1951/03/12, MRN [id number] ? ?PCP:  Biagio Borg, MD  ?Cardiologist:  Fransico Him, MD  ? ?Chief Complaint  ?Patient presents with  ? Follow-up  ?  Hypertension, aortic stenosis, MR  ? ? ?History of Present Illness:  ?Laura Poole is a 71 y.o. female with history of breast cancer and atypical chest pain as well as exertional dyspnea.  Myoview obtained in April 2011 showed no evidence for ischemia. She had a cardiac catheterization in April 2013 which showed no coronary disease. She had a carotid artery ultrasound in 2015, which showed elevated left ICA is velocity likely due to tortuosity. She has chronic dyspnea on exertion.  Outpatient arterial Doppler for leg cramps was ordered which showed normal blood flow without obvious lower extremity stenosis.  2D echo showed moderate LVH and SAM in 2018.  Cardiac MRI 2018 showed normal LVF with no HOCM.  There was mild LVOT gradient caused by upper septal hypertrophy but no SAM.  No LGE.  ? ?She is here today for followup and is doing well.  She has developed some SOB after having COVID 19 which has improved on inhalers. She has some ankle edema at times.  She takes Mobic and amlodipine which can exacerbate LE edema and also sits down at work without elevating legs.  She denies any chest pain or pressure,  PND, orthopnea, dizziness, palpitations or syncope. She is compliant with her meds and is tolerating meds with no SE.    ? ?Past Medical History:  ?Diagnosis Date  ? Aortic stenosis   ? mild to moderate on echo 02/2021  ? Bilateral leg cramps 09/01/2015  ? Breast cancer ([place])   ? left  ? Essential hypertension 01/07/2010  ? Qualifier: Diagnosis of  By: Linna Darner MD, Gwyndolyn Saxon    ? GERD (gastroesophageal reflux disease)   ? H/O: hysterectomy   ? History of breast cancer 2006  ? History of colonic polyps   ? HLD  (hyperlipidemia)   ? Mitral regurgitation 08/05/2015  ? Personal history of chemotherapy 2006  ? Left Breast Cancer  ? Personal history of radiation therapy 2006  ? Left Breast Cancer  ? Snoring 03/30/2017  ? ? ?Past Surgical History:  ?Procedure Laterality Date  ? ABDOMINAL HYSTERECTOMY    ? BREAST LUMPECTOMY Left 2006  ? COLONOSCOPY W/ POLYPECTOMY  2004, 2009  ? Dr Watt Climes  ? TUBAL LIGATION    ? ? ?Current Medications: ?Current Meds  ?Medication Sig  ? acetaminophen (TYLENOL) 500 MG tablet Take 500 mg by mouth every 6 (six) hours as needed for mild pain or headache.  ? albuterol (VENTOLIN HFA) 108 (90 Base) MCG/ACT inhaler Inhale 2 puffs into the lungs every 6 (six) hours as needed for wheezing or shortness of breath.  ? amLODipine (NORVASC) 10 MG tablet Take 1 tablet (10 mg total) by mouth daily.  ? aspirin 81 MG tablet Take 1 tablet (81 mg total) by mouth daily.  ? COVID-19 mRNA bivalent vaccine, Moderna, (MODERNA COVID-19 BIVAL BOOSTER) 50 MCG/0.5ML injection Inject into the muscle.  ? diphenhydrAMINE (BENADRYL) 25 mg capsule Take 25 mg by mouth every 6 (six) hours as needed for allergies.  ? gabapentin (NEURONTIN) 300 MG capsule Take 1 capsule (300 mg total) by mouth at bedtime.  ? losartan (COZAAR) 50 MG  tablet Take 1 tablet (50 mg total) by mouth daily.  ? meloxicam (MOBIC) 7.5 MG tablet Take 1 tablet (7.5 mg total) by mouth daily.  ? pantoprazole (PROTONIX) 40 MG tablet Take 1 tablet (40 mg total) by mouth daily.  ? rosuvastatin (CRESTOR) 10 MG tablet Take 1 tablet (10 mg total) by mouth daily.  ? solifenacin (VESICARE) 5 MG tablet Take 1 tablet (5 mg total) by mouth daily.  ? spironolactone (ALDACTONE) 25 MG tablet Take 1 tablet (25 mg total) by mouth 2 (two) times daily.  ? tiZANidine (ZANAFLEX) 2 MG tablet TAKE 1 TABLET EVERY 6 HOURS AS NEEDED FOR MUSCLE SPASMS  ? triamcinolone (NASACORT) 55 MCG/ACT AERO nasal inhaler Place 2 sprays into the nose daily.  ? vitamin C (ASCORBIC ACID) 500 MG tablet Take 500  mg by mouth daily.  ? zolpidem (AMBIEN) 10 MG tablet TAKE ONE TABLET AT BEDTIME AS NEEDED FOR SLEEP  ? ? ?Allergies:   Patient has no known allergies.  ? ?Social History  ? ?Socioeconomic History  ? Marital status: Single  ?  Spouse name: Not on file  ? Number of children: Not on file  ? Years of education: 37  ? Highest education level: High school graduate  ?Occupational History  ? Not on file  ?Tobacco Use  ? Smoking status: Never  ? Smokeless tobacco: Never  ?Vaping Use  ? Vaping Use: Never used  ?Substance and Sexual Activity  ? Alcohol use: Yes  ?  Alcohol/week: 2.0 standard drinks  ?  Types: 2 Cans of beer per week  ?  Comment: rarely  ? Drug use: No  ? Sexual activity: Not on file  ?Other Topics Concern  ? Not on file  ?Social History Narrative  ? Not on file  ? ?Social Determinants of Health  ? ?Financial Resource Strain: Low Risk   ? Difficulty of Paying Living Expenses: Not hard at all  ?Food Insecurity: No Food Insecurity  ? Worried About Charity fundraiser in the Last Year: Never true  ? Ran Out of Food in the Last Year: Never true  ?Transportation Needs: No Transportation Needs  ? Lack of Transportation (Medical): No  ? Lack of Transportation (Non-Medical): No  ?Physical Activity: Inactive  ? Days of Exercise per Week: 0 days  ? Minutes of Exercise per Session: 0 min  ?Stress: No Stress Concern Present  ? Feeling of Stress : Not at all  ?Social Connections: Moderately Integrated  ? Frequency of Communication with Friends and Family: More than three times a week  ? Frequency of Social Gatherings with Friends and Family: More than three times a week  ? Attends Religious Services: More than 4 times per year  ? Active Member of Clubs or Organizations: No  ? Attends Archivist Meetings: More than 4 times per year  ? Marital Status: Never married  ?  ? ?Family History:  The patient's family history includes Aneurysm in her brother; Asthma in an other family member; Breast cancer in her paternal  grandmother; Coronary artery disease in her mother; Heart attack (age of onset: 64) in her father; Hyperlipidemia in an other family member; Hypertension in her mother; Sudden death in an other family member.  ? ?ROS:   ?Please see the history of present illness.    ?ROS All other systems reviewed and are negative. ? ?   ? View : No data to display.  ?  ?  ?  ? ? ? ?PHYSICAL  EXAM:   ?VS:  BP (!) 148/90   Pulse 86   Ht '5\' 8"'$  (1.727 m)   Wt 195 lb 3.2 oz (88.5 kg)   SpO2 98%   BMI 29.68 kg/m?    ? ?GEN: Well nourished, well developed in no acute distress ?HEENT: Normal ?NECK: No JVD; No carotid bruits ?LYMPHATICS: No lymphadenopathy ?CARDIAC:RRR, no rubs, gallops.  2/6 SM at RUSB ?RESPIRATORY:  Clear to auscultation without rales, wheezing or rhonchi  ?ABDOMEN: Soft, non-tender, non-distended ?MUSCULOSKELETAL:  No edema; No deformity  ?SKIN: Warm and dry ?NEUROLOGIC:  Alert and oriented x 3 ?PSYCHIATRIC:  Normal affect   ? ?Wt Readings from Last 3 Encounters:  ?02/23/22 195 lb 3.2 oz (88.5 kg)  ?11/02/21 189 lb (85.7 kg)  ?05/14/21 178 lb (80.7 kg)  ?  ? ? ?Studies/Labs Reviewed:  ? ?EKG:  EKG is ordered today and demonstrates NSR with anterior infarct age undetermined ? ?Recent Labs: ?05/14/2021: Pro B Natriuretic peptide (BNP) 10.0 ?11/02/2021: ALT 19; BUN 17; Creatinine, Ser 0.91; Hemoglobin 12.8; Platelets 311.0; Potassium 4.1; Sodium 141; TSH 2.65  ? ?Lipid Panel ?   ?Component Value Date/Time  ? CHOL 197 11/02/2021 1111  ? TRIG 119.0 11/02/2021 1111  ? HDL 67.20 11/02/2021 1111  ? CHOLHDL 3 11/02/2021 1111  ? VLDL 23.8 11/02/2021 1111  ? LDLCALC 106 (H) 11/02/2021 1111  ? ? ?Additional studies/ records that were reviewed today include:  ?none ? ?ASSESSMENT:   ? ?1. Essential hypertension   ?2. Nonrheumatic mitral valve regurgitation   ?3. Nonrheumatic aortic valve stenosis   ?4. Bilateral leg edema   ? ? ?PLAN:  ?In order of problems listed above: ? ?1.  HTN ?-BP is adequately controlled on exam today ?-2D  echo showed severe asymmetric septal hypertrophy and low normal LV function with EF 50 to 55% with grade 2 diastolic dysfunction but no LVOT gradient or Sam ?-cardiac MRI 2018 showed normal LVF with no HOCM.  There w

## 2022-02-23 NOTE — Patient Instructions (Signed)
Medication Instructions:  ?Your physician has recommended you make the following change in your medication:  ?1) STOP taking amlodipine  ?2) START taking Toprol XL 50 mg daily  ? ?*If you need a refill on your cardiac medications before your next appointment, please call your pharmacy* ? ?Lab Work: ?BMET in one week ?If you have labs (blood work) drawn today and your tests are completely normal, you will receive your results only by: ?MyChart Message (if you have MyChart) OR ?A paper copy in the mail ?If you have any lab test that is abnormal or we need to change your treatment, we will call you to review the results. ? ?Testing/Procedures: ?Your physician has requested that you have an echocardiogram. Echocardiography is a painless test that uses sound waves to create images of your heart. It provides your doctor with information about the size and shape of your heart and how well your heart?s chambers and valves are working. This procedure takes approximately one hour. There are no restrictions for this procedure. ? ?Follow-Up: ?At [facility], you and your health needs are our priority.  As part of our continuing mission to provide you with exceptional heart care, we have created designated Provider Care Teams.  These Care Teams include your primary Cardiologist (physician) and Advanced Practice Providers (APPs -  Physician Assistants and Nurse Practitioners) who all work together to provide you with the care you need, when you need it. ? ?Your next appointment:   ?2 week(s) ? ?The format for your next appointment:   ?In Person ? ?Provider:   ?Fransico Him, MD   ? ?Important Information About Sugar ? ? ? ? ?  ?

## 2022-02-23 NOTE — Addendum Note (Signed)
Addended by: Antonieta Iba on: 02/23/2022 08:48 AM ? ? Modules accepted: Orders ? ?

## 2022-02-23 NOTE — Telephone Encounter (Signed)
The patient has been notified of the result and verbalized understanding.  All questions (if any) were answered. ?Antonieta Iba, RN 02/23/2022 3:22 PM  ? ?

## 2022-03-11 ENCOUNTER — Ambulatory Visit (INDEPENDENT_AMBULATORY_CARE_PROVIDER_SITE_OTHER): Payer: Medicare (Managed Care) | Admitting: Cardiology

## 2022-03-11 ENCOUNTER — Other Ambulatory Visit: Payer: Medicare (Managed Care)

## 2022-03-11 ENCOUNTER — Encounter: Payer: Self-pay | Admitting: Cardiology

## 2022-03-11 VITALS — BP 164/88 | HR 78 | Ht 68.0 in | Wt 197.8 lb

## 2022-03-11 DIAGNOSIS — I340 Nonrheumatic mitral (valve) insufficiency: Secondary | ICD-10-CM

## 2022-03-11 DIAGNOSIS — R600 Localized edema: Secondary | ICD-10-CM | POA: Diagnosis not present

## 2022-03-11 DIAGNOSIS — I10 Essential (primary) hypertension: Secondary | ICD-10-CM

## 2022-03-11 DIAGNOSIS — I350 Nonrheumatic aortic (valve) stenosis: Secondary | ICD-10-CM | POA: Diagnosis not present

## 2022-03-11 MED ORDER — LOSARTAN POTASSIUM 100 MG PO TABS: 100.0000 mg | ORAL_TABLET | Freq: Every day | ORAL | 3 refills | Status: DC

## 2022-03-11 NOTE — Progress Notes (Signed)
Date:  05/23/2020   ID:  Laura Poole DOB May 06, 1951 MRN [id number]  Cardiology Office Note    Date:  03/11/2022   ID:  Laura Poole, DOB 10-27-1950, MRN [id number]  PCP:  Biagio Borg, MD  Cardiologist:  Fransico Him, MD   Chief Complaint  Patient presents with   Hypertension   Mitral Regurgitation    History of Present Illness:  Laura Poole is a 71 y.o. female with history of breast cancer and atypical chest pain as well as exertional dyspnea.  Myoview obtained in April 2011 showed no evidence for ischemia. She had a cardiac catheterization in April 2013 which showed no coronary disease. She had a carotid artery ultrasound in 2015, which showed elevated left ICA is velocity likely due to tortuosity. She has chronic dyspnea on exertion.  Outpatient arterial Doppler for leg cramps was ordered which showed normal blood flow without obvious lower extremity stenosis.  2D echo showed moderate LVH and SAM in 2018.  Cardiac MRI 2018 showed normal LVF with no HOCM.  There was mild LVOT gradient caused by upper septal hypertrophy but no SAM.  No LGE.   Laura Poole is here today for followup and is doing well.  She tells me that she has had a lot of stress with graduations in her family right now and BP has been high. She has had an increase in her GERD and belching recently and takes Protonix. She has occasional LE edema but this has significantly improved after going off Amlodipine. She denies any chest pain or pressure, SOB, DOE, PND, orthopnea, dizziness, palpitations or syncope. She is compliant with her meds and is tolerating meds with no SE.     Past Medical History:  Diagnosis Date   Aortic stenosis    mild to moderate on echo 02/2021 with no AS on echo 02/2022   Bilateral leg cramps 09/01/2015   Breast cancer ([place])    left   Essential hypertension 01/07/2010   Qualifier: Diagnosis of  By: Linna Darner MD, Gwyndolyn Saxon     GERD (gastroesophageal reflux disease)    H/O: hysterectomy     History of breast cancer 2006   History of colonic polyps    HLD (hyperlipidemia)    Mitral regurgitation 08/05/2015   Mild to moderate by echo 02/2022   Personal history of chemotherapy 2006   Left Breast Cancer   Personal history of radiation therapy 2006   Left Breast Cancer   Snoring 03/30/2017    Past Surgical History:  Procedure Laterality Date   ABDOMINAL HYSTERECTOMY     BREAST LUMPECTOMY Left 2006   COLONOSCOPY W/ POLYPECTOMY  2004, 2009   Dr Watt Climes   TUBAL LIGATION      Current Medications: Current Meds  Medication Sig   acetaminophen (TYLENOL) 500 MG tablet Take 500 mg by mouth every 6 (six) hours as needed for mild pain or headache.   albuterol (VENTOLIN HFA) 108 (90 Base) MCG/ACT inhaler Inhale 2 puffs into the lungs every 6 (six) hours as needed for wheezing or shortness of breath.   aspirin 81 MG tablet Take 1 tablet (81 mg total) by mouth daily.   COVID-19 mRNA bivalent vaccine, Moderna, (MODERNA COVID-19 BIVAL BOOSTER) 50 MCG/0.5ML injection Inject into the muscle.   diphenhydrAMINE (BENADRYL) 25 mg capsule Take 25 mg by mouth every 6 (six) hours as needed for allergies.   gabapentin (NEURONTIN) 300 MG capsule Take 1 capsule (300 mg total) by mouth at bedtime.  losartan (COZAAR) 50 MG tablet Take 1 tablet (50 mg total) by mouth daily.   meloxicam (MOBIC) 7.5 MG tablet Take 1 tablet (7.5 mg total) by mouth daily.   metoprolol succinate (TOPROL-XL) 50 MG 24 hr tablet Take 1 tablet (50 mg total) by mouth daily. Take with or immediately following a meal.   pantoprazole (PROTONIX) 40 MG tablet Take 1 tablet (40 mg total) by mouth daily.   rosuvastatin (CRESTOR) 10 MG tablet Take 1 tablet (10 mg total) by mouth daily.   solifenacin (VESICARE) 5 MG tablet Take 1 tablet (5 mg total) by mouth daily.   spironolactone (ALDACTONE) 25 MG tablet Take 1 tablet (25 mg total) by mouth 2 (two) times daily.   tiZANidine (ZANAFLEX) 2 MG tablet TAKE 1 TABLET EVERY 6 HOURS AS NEEDED  FOR MUSCLE SPASMS   triamcinolone (NASACORT) 55 MCG/ACT AERO nasal inhaler Place 2 sprays into the nose daily.   vitamin C (ASCORBIC ACID) 500 MG tablet Take 500 mg by mouth daily.   zolpidem (AMBIEN) 10 MG tablet TAKE ONE TABLET AT BEDTIME AS NEEDED FOR SLEEP    Allergies:   Patient has no known allergies.   Social History   Socioeconomic History   Marital status: Single    Spouse name: Not on file   Number of children: Not on file   Years of education: 12   Highest education level: High school graduate  Occupational History   Not on file  Tobacco Use   Smoking status: Never   Smokeless tobacco: Never  Vaping Use   Vaping Use: Never used  Substance and Sexual Activity   Alcohol use: Yes    Alcohol/week: 2.0 standard drinks    Types: 2 Cans of beer per week    Comment: rarely   Drug use: No   Sexual activity: Not on file  Other Topics Concern   Not on file  Social History Narrative   Not on file   Social Determinants of Health   Financial Resource Strain: Low Risk    Difficulty of Paying Living Expenses: Not hard at all  Food Insecurity: No Food Insecurity   Worried About Charity fundraiser in the Last Year: Never true   [place] in the Last Year: Never true  Transportation Needs: No Transportation Needs   Lack of Transportation (Medical): No   Lack of Transportation (Non-Medical): No  Physical Activity: Inactive   Days of Exercise per Week: 0 days   Minutes of Exercise per Session: 0 min  Stress: No Stress Concern Present   Feeling of Stress : Not at all  Social Connections: Moderately Integrated   Frequency of Communication with Friends and Family: More than three times a week   Frequency of Social Gatherings with Friends and Family: More than three times a week   Attends Religious Services: More than 4 times per year   Active Member of [employer] or Organizations: No   Attends Music therapist: More than 4 times per year   Marital Status:  Never married     Family History:  The patient's family history includes Aneurysm in her brother; Asthma in an other family member; Breast cancer in her paternal grandmother; Coronary artery disease in her mother; Heart attack (age of onset: 21) in her father; Hyperlipidemia in an other family member; Hypertension in her mother; Sudden death in an other family member.   ROS:   Please see the history of present illness.  ROS All other systems reviewed and are negative.      View : No data to display.           PHYSICAL EXAM:   VS:  BP (!) 164/88   Pulse 78   Ht '5\' 8"'$  (1.727 m)   Wt 197 lb 12.8 oz (89.7 kg)   SpO2 99%   BMI 30.08 kg/m     GEN: Well nourished, well developed in no acute distress HEENT: Normal NECK: No JVD; No carotid bruits LYMPHATICS: No lymphadenopathy CARDIAC:RRR, no rubs, gallops.  2/6 SM at RUSB RESPIRATORY:  Clear to auscultation without rales, wheezing or rhonchi  ABDOMEN: Soft, non-tender, non-distended MUSCULOSKELETAL:  No edema; No deformity  SKIN: Warm and dry NEUROLOGIC:  Alert and oriented x 3 PSYCHIATRIC:  Normal affect    Wt Readings from Last 3 Encounters:  03/11/22 197 lb 12.8 oz (89.7 kg)  02/23/22 195 lb 3.2 oz (88.5 kg)  11/02/21 189 lb (85.7 kg)      Studies/Labs Reviewed:   EKG:  EKG is not ordered today   Recent Labs: 05/14/2021: Pro B Natriuretic peptide (BNP) 10.0 11/02/2021: ALT 19; BUN 17; Creatinine, Ser 0.91; Hemoglobin 12.8; Platelets 311.0; Potassium 4.1; Sodium 141; TSH 2.65   Lipid Panel    Component Value Date/Time   CHOL 197 11/02/2021 1111   TRIG 119.0 11/02/2021 1111   HDL 67.20 11/02/2021 1111   CHOLHDL 3 11/02/2021 1111   VLDL 23.8 11/02/2021 1111   LDLCALC 106 (H) 11/02/2021 1111    Additional studies/ records that were reviewed today include:  none  ASSESSMENT:    1. Essential hypertension   2. Nonrheumatic mitral valve regurgitation   3. Nonrheumatic aortic valve stenosis   4. Bilateral leg  edema     PLAN:  In order of problems listed above:  1.  HTN -BP is elevated on exam today -2D echo showed severe asymmetric septal hypertrophy and low normal LV function with EF 50 to 55% with grade 2 diastolic dysfunction but no LVOT gradient or Sam -cardiac MRI 2018 showed normal LVF with no HOCM.  There was mild LVOT gradient caused by upper septal hypertrophy but no SAM.  No LGE.  -LVH related to HTN and therefore needs aggressive BP control -Amlodipine was stopped due to lower extremity edema -Increase losartan to 100 mg daily  -Continue prescription drug management with Toprol-XL 50 mg daily, spironolactone 25 mg twice daily with as needed refills -Check bmet in 1 week -Follow-up with Pharm.D. in hypertension clinic in 2 weeks -I have personally reviewed and interpreted outside labs performed by patient's PCP which showed serum creatinine 0.91 and potassium 4.1 on 11/02/2021   2.  MR -mild by echo 2022 -Moderate by echo 02-2022  -repeat echo 02/2023 to make sure this is stable  3.  Aortic stenosis -Mild to moderate by 2D echo 03/02/2021 -No AS by echo 02/2022  4.  LE edema -likely multifactorial from mobic, amlodipine and sedentary state at work -Stopped amlodipine due to LE edema  -Stable on chlorthalidone   Medication Adjustments/Labs and Tests Ordered: Current medicines are reviewed at length with the patient today.  Concerns regarding medicines are outlined above.  Medication changes, Labs and Tests ordered today are listed in the Patient Instructions below.  Followup with me in 1 year and PharmD in 1 week  Signed, Fransico Him, MD  03/11/2022 9:29 AM    [address], [place], [place]  [address] Phone: [phone]; Fax: [phone]

## 2022-03-11 NOTE — Addendum Note (Signed)
Addended by: Antonieta Iba on: 03/11/2022 09:37 AM   Modules accepted: Orders

## 2022-03-11 NOTE — Patient Instructions (Signed)
Medication Instructions:  Your physician has recommended you make the following change in your medication: 1) INCREASE Losartan to 100 mg daily *If you need a refill on your cardiac medications before your next appointment, please call your pharmacy*  Lab Work: IN ONE WEEK: BMET  If you have labs (blood work) drawn today and your tests are completely normal, you will receive your results only by: [place] [place] (if you have MyChart) OR A paper copy in the mail If you have any lab test that is abnormal or we need to change your treatment, we will call you to review the results.  Testing/Procedures: Your physician has requested that you have an echocardiogram in May 2024. Echocardiography is a painless test that uses sound waves to create images of your heart. It provides your doctor with information about the size and shape of your heart and how well your heart's chambers and valves are working. This procedure takes approximately one hour. There are no restrictions for this procedure.  Follow-Up: At [facility], you and your health needs are our priority.  As part of our continuing mission to provide you with exceptional heart care, we have created designated Provider Care Teams.  These Care Teams include your primary Cardiologist (physician) and Advanced Practice Providers (APPs -  Physician Assistants and Nurse Practitioners) who all work together to provide you with the care you need, when you need it.  Your next appointment:   1 year(s)  The format for your next appointment:   In Person  Provider:   Fransico Him, MD   Other Instructions You have been referred to see PharmD in the Hypertension [place]

## 2022-03-18 ENCOUNTER — Other Ambulatory Visit: Payer: Medicare (Managed Care)

## 2022-03-18 DIAGNOSIS — R600 Localized edema: Secondary | ICD-10-CM

## 2022-03-18 DIAGNOSIS — I350 Nonrheumatic aortic (valve) stenosis: Secondary | ICD-10-CM | POA: Diagnosis not present

## 2022-03-18 DIAGNOSIS — I340 Nonrheumatic mitral (valve) insufficiency: Secondary | ICD-10-CM | POA: Diagnosis not present

## 2022-03-18 DIAGNOSIS — I10 Essential (primary) hypertension: Secondary | ICD-10-CM | POA: Diagnosis not present

## 2022-03-18 LAB — BASIC METABOLIC PANEL
BUN/Creatinine Ratio: 17 (ref 12–28)
BUN: 17 mg/dL (ref 8–27)
CO2: 26 mmol/L (ref 20–29)
Calcium: 9.2 mg/dL (ref 8.7–10.3)
Chloride: 100 mmol/L (ref 96–106)
Creatinine, Ser: 1 mg/dL (ref 0.57–1.00)
Glucose: 101 mg/dL — ABNORMAL HIGH (ref 70–99)
Potassium: 4.5 mmol/L (ref 3.5–5.2)
Sodium: 139 mmol/L (ref 134–144)
eGFR: 61 mL/min/{1.73_m2} (ref >59)

## 2022-03-22 ENCOUNTER — Ambulatory Visit (INDEPENDENT_AMBULATORY_CARE_PROVIDER_SITE_OTHER): Payer: Medicare (Managed Care)

## 2022-03-22 VITALS — BP 160/80 | Temp 97.8°F | Ht 68.0 in | Wt 191.6 lb

## 2022-03-22 DIAGNOSIS — Z0000 Encounter for general adult medical examination without abnormal findings: Secondary | ICD-10-CM

## 2022-03-22 NOTE — Progress Notes (Signed)
Subjective:   Laura Poole is a 71 y.o. female who presents for Medicare Annual (Subsequent) preventive examination.  Review of Systems     Cardiac Risk Factors include: advanced age (>62mn, >>72women);dyslipidemia;family history of premature cardiovascular disease;hypertension;sedentary lifestyle     Objective:    Today's Vitals   03/22/22 1530  BP: (!) 160/80  Temp: 97.8 F (36.6 C)  Weight: 191 lb 9.6 oz (86.9 kg)  Height: '5\' 8"'$  (1.727 m)  PainSc: 0-No pain   Body mass index is 29.13 kg/m.     03/19/2021    3:35 PM 01/28/2012    9:13 AM  Advanced Directives  Does Patient Have a Medical Advance Directive? No Patient does not have advance directive  Pre-existing out of facility DNR order (yellow form or pink MOST form)  No    Current Medications (verified) Outpatient Encounter Medications as of 03/22/2022  Medication Sig   acetaminophen (TYLENOL) 500 MG tablet Take 500 mg by mouth every 6 (six) hours as needed for mild pain or headache.   albuterol (VENTOLIN HFA) 108 (90 Base) MCG/ACT inhaler Inhale 2 puffs into the lungs every 6 (six) hours as needed for wheezing or shortness of breath.   aspirin 81 MG tablet Take 1 tablet (81 mg total) by mouth daily.   COVID-19 mRNA bivalent vaccine, Moderna, (MODERNA COVID-19 BIVAL BOOSTER) 50 MCG/0.5ML injection Inject into the muscle.   gabapentin (NEURONTIN) 300 MG capsule Take 1 capsule (300 mg total) by mouth at bedtime.   losartan (COZAAR) 100 MG tablet Take 1 tablet (100 mg total) by mouth daily.   meloxicam (MOBIC) 7.5 MG tablet Take 1 tablet (7.5 mg total) by mouth daily.   metoprolol succinate (TOPROL-XL) 50 MG 24 hr tablet Take 1 tablet (50 mg total) by mouth daily. Take with or immediately following a meal. (Patient taking differently: Take 50 mg by mouth in the morning and at bedtime. Take with or immediately following a meal.)   pantoprazole (PROTONIX) 40 MG tablet Take 1 tablet (40 mg total) by mouth daily.    rosuvastatin (CRESTOR) 10 MG tablet Take 1 tablet (10 mg total) by mouth daily.   solifenacin (VESICARE) 5 MG tablet Take 1 tablet (5 mg total) by mouth daily.   spironolactone (ALDACTONE) 25 MG tablet Take 1 tablet (25 mg total) by mouth 2 (two) times daily.   tiZANidine (ZANAFLEX) 2 MG tablet TAKE 1 TABLET EVERY 6 HOURS AS NEEDED FOR MUSCLE SPASMS   triamcinolone (NASACORT) 55 MCG/ACT AERO nasal inhaler Place 2 sprays into the nose daily.   vitamin C (ASCORBIC ACID) 500 MG tablet Take 500 mg by mouth daily.   zolpidem (AMBIEN) 10 MG tablet TAKE ONE TABLET AT BEDTIME AS NEEDED FOR SLEEP   diphenhydrAMINE (BENADRYL) 25 mg capsule Take 25 mg by mouth every 6 (six) hours as needed for allergies. (Patient not taking: Reported on 03/22/2022)   [DISCONTINUED] benzonatate (TESSALON) 100 MG capsule Take 1-2 capsules (100-200 mg total) by mouth 3 (three) times daily as needed for cough. (Patient not taking: Reported on 02/23/2022)   [DISCONTINUED] DULoxetine (CYMBALTA) 60 MG capsule Take 1 capsule (60 mg total) by mouth daily. (Patient not taking: Reported on 02/23/2022)   [DISCONTINUED] promethazine-dextromethorphan (PROMETHAZINE-DM) 6.25-15 MG/5ML syrup Take 5 mLs by mouth at bedtime as needed for cough. (Patient not taking: Reported on 02/23/2022)   [DISCONTINUED] traMADol (ULTRAM) 50 MG tablet Take 1 tablet (50 mg total) by mouth every 6 (six) hours as needed. (Patient not taking: Reported on  02/23/2022)   No facility-administered encounter medications on file as of 03/22/2022.    Allergies (verified) Patient has no known allergies.   History: Past Medical History:  Diagnosis Date   Aortic stenosis    mild to moderate on echo 02/2021 with no AS on echo 02/2022   Bilateral leg cramps 09/01/2015   Breast cancer ([place])    left   Essential hypertension 01/07/2010   Qualifier: Diagnosis of  By: Linna Darner MD, Gwyndolyn Saxon     GERD (gastroesophageal reflux disease)    H/O: hysterectomy    History of breast cancer  2006   History of colonic polyps    HLD (hyperlipidemia)    Mitral regurgitation 08/05/2015   Mild to moderate by echo 02/2022   Personal history of chemotherapy 2006   Left Breast Cancer   Personal history of radiation therapy 2006   Left Breast Cancer   Snoring 03/30/2017   Past Surgical History:  Procedure Laterality Date   ABDOMINAL HYSTERECTOMY     BREAST LUMPECTOMY Left 2006   COLONOSCOPY W/ POLYPECTOMY  2004, 2009   Dr Watt Climes   TUBAL LIGATION     Family History  Problem Relation Age of Onset   Heart attack Father 9   Hypertension Mother    Coronary artery disease Mother    Aneurysm Brother    Asthma Other        nephew   Hyperlipidemia Other        nephew   Sudden death Other        nephew   Breast cancer Paternal Grandmother    Colon cancer Neg Hx    Social History   Socioeconomic History   Marital status: Single    Spouse name: Not on file   Number of children: Not on file   Years of education: 12   Highest education level: High school graduate  Occupational History   Not on file  Tobacco Use   Smoking status: Never   Smokeless tobacco: Never  Vaping Use   Vaping Use: Never used  Substance and Sexual Activity   Alcohol use: Yes    Alcohol/week: 2.0 standard drinks of alcohol    Types: 2 Cans of beer per week    Comment: rarely   Drug use: No   Sexual activity: Not on file  Other Topics Concern   Not on file  Social History Narrative   Not on file   Social Determinants of Health   Financial Resource Strain: Low Risk  (03/22/2022)   Overall Financial Resource Strain (CARDIA)    Difficulty of Paying Living Expenses: Not hard at all  Food Insecurity: No Food Insecurity (03/22/2022)   Hunger Vital Sign    Worried About Running Out of Food in the Last Year: Never true    Ran Out of Food in the Last Year: Never true  Transportation Needs: No Transportation Needs (03/22/2022)   PRAPARE - Hydrologist (Medical): No     Lack of Transportation (Non-Medical): No  Physical Activity: Inactive (03/22/2022)   Exercise Vital Sign    Days of Exercise per Week: 0 days    Minutes of Exercise per Session: 0 min  Stress: No Stress Concern Present (03/22/2022)   [place]    Feeling of Stress : Not at all  Social Connections: Moderately Integrated (03/22/2022)   Social Connection and Isolation Panel [NHANES]    Frequency of Communication with Friends and  Family: More than three times a week    Frequency of Social Gatherings with Friends and Family: More than three times a week    Attends Religious Services: More than 4 times per year    Active Member of Clubs or Organizations: No    Attends Music therapist: More than 4 times per year    Marital Status: Never married    Tobacco Counseling Counseling given: Not Answered   Clinical Intake:  Pre-visit preparation completed: Yes  Pain : No/denies pain Pain Score: 0-No pain     BMI - recorded: 29.13 Nutritional Status: BMI 25 -29 Overweight Nutritional Risks: None Diabetes: No  How often do you need to have someone help you when you read instructions, pamphlets, or other written materials from your doctor or pharmacy?: 1 - Never 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 no  Interpreter Needed?: No  Information entered by :: Lisette Abu, LPN.   Activities of Daily Living    03/22/2022    4:53 PM  In your present state of health, do you have any difficulty performing the following activities:  Hearing? 0  Vision? 0  Difficulty concentrating or making decisions? 0  Walking or climbing stairs? 0  Dressing or bathing? 0  Doing errands, shopping? 0  Preparing Food and eating ? N  Using the Toilet? N  In the past six months, have you accidently leaked urine? N  Do you have problems with loss of bowel control? N  Managing  your Medications? N  Managing your Finances? N  Housekeeping or managing your Housekeeping? N    Patient Care Team: Biagio Borg, MD as PCP - General (Internal Medicine) Sueanne Margarita, MD as PCP - Cardiology (Cardiology)  Indicate any recent Medical Services you may have received from other than [facility] providers in the past year (date may be approximate).     Assessment:   This is a routine wellness examination for [place].  Hearing/Vision screen Hearing Screening - Comments:: Patient denied any hearing difficulty. No hearing aids. Vision Screening - Comments:: Patient wears corrective lenses/contacts. Eye exam done by: [facility]   Dietary issues and exercise activities discussed: Current Exercise Habits: The patient does not participate in regular exercise at present, Exercise limited by: None identified   Goals Addressed             This Visit's Progress    Client will verbalize knowledge of self management of Hypertension as evidences by BP reading of 140/90 or less; or as defined by provider        Depression Screen    03/22/2022    4:53 PM 03/19/2021    3:56 PM 01/09/2020    9:12 AM 03/16/2019   11:36 AM 08/28/2018   10:41 AM 07/29/2017   10:46 AM 05/17/2016    3:51 PM  PHQ 2/9 Scores  PHQ - 2 Score 0 0 0 0 0 0 0  PHQ- 9 Score     2      Fall Risk    03/22/2022    4:53 PM 03/19/2021    3:59 PM 01/09/2020    9:12 AM 12/07/2019    3:15 PM 03/16/2019   11:36 AM  [place] in the past year? 0 0 0 0 0  Number falls in past yr: 0 0  0   Injury with Fall? 0 0  0   Risk for fall due to :  No Fall Risks No Fall Risks     Follow up Falls evaluation completed Falls evaluation completed       [place]:  Any stairs in or around the home? No  If so, are there any without handrails? No  Home free of loose throw rugs in walkways, pet beds, electrical cords, etc? Yes  Adequate lighting in your home to reduce risk of falls?  Yes   ASSISTIVE DEVICES UTILIZED TO PREVENT FALLS:  Life alert? No  Use of a cane, walker or w/c? No  Grab bars in the bathroom? No  Shower chair or bench in shower? No  Elevated toilet seat or a handicapped toilet? Yes   TIMED UP AND GO:  Was the test performed? Yes .  Length of time to ambulate 10 feet: 6 sec.   Gait steady and fast without use of assistive device  Cognitive Function:        03/22/2022    4:54 PM  6CIT Screen  What Year? 0 points  What month? 0 points  What time? 0 points  Count back from 20 0 points  Months in reverse 0 points  Repeat phrase 0 points  Total Score 0 points    Immunizations Immunization History  Administered Date(s) Administered   Fluad Quad(high Dose 65+) 06/15/2019, 08/04/2020, 07/27/2021   Influenza Split 08/09/2012   Influenza, High Dose Seasonal PF 07/29/2017, 06/27/2018   Influenza-Unspecified 07/11/2013   Moderna Covid-19 Vaccine Bivalent Booster 21yr & up 12/25/2021   Moderna SARS-COV2 Booster Vaccination 08/07/2020, 03/19/2021, 04/20/2021   Moderna Sars-Covid-2 Vaccination 10/26/2019, 11/23/2019   Pneumococcal Conjugate-13 06/27/2018   Pneumococcal Polysaccharide-23 01/09/2020   Tetanus 12/04/2012   Zoster, Live 05/20/2016    TDAP status: Up to date  Flu Vaccine status: Up to date  Pneumococcal vaccine status: Up to date  Covid-19 vaccine status: Completed vaccines  Qualifies for Shingles Vaccine? Yes   Zostavax completed Yes   Shingrix Completed?: No.    Education has been provided regarding the importance of this vaccine. Patient has been advised to call insurance company to determine out of pocket expense if they have not yet received this vaccine. Advised may also receive vaccine at local pharmacy or Health Dept. Verbalized acceptance and understanding.  Screening Tests Health Maintenance  Topic Date Due   Zoster Vaccines- Shingrix (1 of 2) Never done   COVID-19 Vaccine (4 - Moderna series) 04/26/2022    INFLUENZA VACCINE  05/11/2022   TETANUS/TDAP  12/04/2022   MAMMOGRAM  10/30/2023   COLONOSCOPY (Pts 45-449yrInsurance coverage will need to be confirmed)  08/19/2029   Pneumonia Vaccine 71Years old  Completed   DEXA SCAN  Completed   Hepatitis C Screening  Completed   HPV VACCINES  Aged Out    Health Maintenance  Health Maintenance Due  Topic Date Due   Zoster Vaccines- Shingrix (1 of 2) Never done    Colorectal cancer screening: Type of screening: Colonoscopy. Completed 08/20/2019. Repeat every 10 years  Mammogram status: Completed 11/24/2021. Repeat every year  Bone Density status: Completed 11/17/2021. Results reflect: Bone density results: NORMAL. Repeat every 5 years.  Lung Cancer Screening: (Low Dose CT Chest recommended if Age 69-80ears, 30 pack-year currently smoking OR have quit w/in 15years.) does not qualify.   Lung Cancer Screening Referral: no  Additional Screening:  Hepatitis C Screening: does qualify; Completed 10/30/2018  Vision Screening: Recommended annual ophthalmology exams for early detection of glaucoma and other disorders of  the eye. Is the patient up to date with their annual eye exam?  Yes  Who is the provider or what is the name of the office in which the patient attends annual eye exams? [facility] If pt is not established with a provider, would they like to be referred to a provider to establish care? No .   Dental Screening: Recommended annual dental exams for proper oral hygiene  Community Resource Referral / Chronic Care Management: CRR required this visit?  No   CCM required this visit?  No      Plan:     I have personally reviewed and noted the following in the patient's chart:   Medical and social history Use of alcohol, tobacco or illicit drugs  Current medications and supplements including opioid prescriptions.  Functional ability and status Nutritional status Physical activity Advanced directives List of other  physicians Hospitalizations, surgeries, and ER visits in previous 12 months Vitals Screenings to include cognitive, depression, and falls Referrals and appointments  In addition, I have reviewed and discussed with patient certain preventive protocols, quality metrics, and best practice recommendations. A written personalized care plan for preventive services as well as general preventive health recommendations were provided to patient.     Sheral Flow, LPN   [id number]   Nurse Notes:  Hearing Screening - Comments:: Patient denied any hearing difficulty. No hearing aids. Vision Screening - Comments:: Patient wears corrective lenses/contacts. Eye exam done by: [facility]

## 2022-03-22 NOTE — Patient Instructions (Signed)
Ms. Belote , Thank you for taking time to come for your Medicare Wellness Visit. I appreciate your ongoing commitment to your health goals. Please review the following plan we discussed and let me know if I can assist you in the future.   Screening recommendations/referrals: Colonoscopy: 08/20/2019; due every 10 years Mammogram: 11/24/2021; due every year Bone Density: 11/17/2021; due every 5 years Recommended yearly ophthalmology/optometry visit for glaucoma screening and checkup Recommended yearly dental visit for hygiene and checkup  Vaccinations: Influenza vaccine: 07/27/2021 Pneumococcal vaccine: 06/27/2018, 01/09/2020 Tdap vaccine: 12/04/2012; due every 10 years Shingles vaccine: never done   Covid-19: 10/26/2019, 11/23/2019, 08/07/2020, 04/20/2021, 12/25/2021  Advanced directives: Yes; daughters aware.  Conditions/risks identified: Yes; Essential Hypertension  Next appointment: Please schedule your next Medicare Wellness Visit with your Nurse Health Advisor in 1 year by calling [phone].   Preventive Care 4 Years and Older, Female Preventive care refers to lifestyle choices and visits with your health care provider that can promote health and wellness. What does preventive care include? A yearly physical exam. This is also called an annual well check. Dental exams once or twice a year. Routine eye exams. Ask your health care provider how often you should have your eyes checked. Personal lifestyle choices, including: Daily care of your teeth and gums. Regular physical activity. Eating a healthy diet. Avoiding tobacco and drug use. Limiting alcohol use. Practicing safe sex. Taking low-dose aspirin every day. Taking vitamin and mineral supplements as recommended by your health care provider. What happens during an annual well check? The services and screenings done by your health care provider during your annual well check will depend on your age, overall health, lifestyle risk  factors, and family history of disease. Counseling  Your health care provider may ask you questions about your: Alcohol use. Tobacco use. Drug use. Emotional well-being. Home and relationship well-being. Sexual activity. Eating habits. History of falls. Memory and ability to understand (cognition). Work and work Statistician. Reproductive health. Screening  You may have the following tests or measurements: Height, weight, and BMI. Blood pressure. Lipid and cholesterol levels. These may be checked every 5 years, or more frequently if you are over 34 years old. Skin check. Lung cancer screening. You may have this screening every year starting at age 30 if you have a 30-pack-year history of smoking and currently smoke or have quit within the past 15 years. Fecal occult blood test (FOBT) of the stool. You may have this test every year starting at age 87. Flexible sigmoidoscopy or colonoscopy. You may have a sigmoidoscopy every 5 years or a colonoscopy every 10 years starting at age 43. Hepatitis C blood test. Hepatitis B blood test. Sexually transmitted disease (STD) testing. Diabetes screening. This is done by checking your blood sugar (glucose) after you have not eaten for a while (fasting). You may have this done every 1-3 years. Bone density scan. This is done to screen for osteoporosis. You may have this done starting at age 77. Mammogram. This may be done every 1-2 years. Talk to your health care provider about how often you should have regular mammograms. Talk with your health care provider about your test results, treatment options, and if necessary, the need for more tests. Vaccines  Your health care provider may recommend certain vaccines, such as: Influenza vaccine. This is recommended every year. Tetanus, diphtheria, and acellular pertussis (Tdap, Td) vaccine. You may need a Td booster every 10 years. Zoster vaccine. You may need this after age 15. Pneumococcal 13-valent  conjugate (PCV13) vaccine. One dose is recommended after age 40. Pneumococcal polysaccharide (PPSV23) vaccine. One dose is recommended after age 37. Talk to your health care provider about which screenings and vaccines you need and how often you need them. This information is not intended to replace advice given to you by your health care provider. Make sure you discuss any questions you have with your health care provider. Document Released: 10/24/2015 Document Revised: 06/16/2016 Document Reviewed: 07/29/2015 Elsevier Interactive Patient Education  2017 [place] Prevention in the Home Falls can cause injuries. They can happen to people of all ages. There are many things you can do to make your home safe and to help prevent falls. What can I do on the outside of my home? Regularly fix the edges of walkways and driveways and fix any cracks. Remove anything that might make you trip as you walk through a door, such as a raised step or threshold. Trim any bushes or trees on the path to your home. Use bright outdoor lighting. Clear any walking paths of anything that might make someone trip, such as rocks or tools. Regularly check to see if handrails are loose or broken. Make sure that both sides of any steps have handrails. Any raised decks and porches should have guardrails on the edges. Have any leaves, snow, or ice cleared regularly. Use sand or salt on walking paths during winter. Clean up any spills in your garage right away. This includes oil or grease spills. What can I do in the bathroom? Use night lights. Install grab bars by the toilet and in the tub and shower. Do not use towel bars as grab bars. Use non-skid mats or decals in the tub or shower. If you need to sit down in the shower, use a plastic, non-slip stool. Keep the floor dry. Clean up any water that spills on the floor as soon as it happens. Remove soap buildup in the tub or shower regularly. Attach bath mats  securely with double-sided non-slip rug tape. Do not have throw rugs and other things on the floor that can make you trip. What can I do in the bedroom? Use night lights. Make sure that you have a light by your bed that is easy to reach. Do not use any sheets or blankets that are too big for your bed. They should not hang down onto the floor. Have a firm chair that has side arms. You can use this for support while you get dressed. Do not have throw rugs and other things on the floor that can make you trip. What can I do in the kitchen? Clean up any spills right away. Avoid walking on wet floors. Keep items that you use a lot in easy-to-reach places. If you need to reach something above you, use a strong step stool that has a grab bar. Keep electrical cords out of the way. Do not use floor polish or wax that makes floors slippery. If you must use wax, use non-skid floor wax. Do not have throw rugs and other things on the floor that can make you trip. What can I do with my stairs? Do not leave any items on the stairs. Make sure that there are handrails on both sides of the stairs and use them. Fix handrails that are broken or loose. Make sure that handrails are as long as the stairways. Check any carpeting to make sure that it is firmly attached to the stairs. Fix any carpet that is  loose or worn. Avoid having throw rugs at the top or bottom of the stairs. If you do have throw rugs, attach them to the floor with carpet tape. Make sure that you have a light switch at the top of the stairs and the bottom of the stairs. If you do not have them, ask someone to add them for you. What else can I do to help prevent falls? Wear shoes that: Do not have high heels. Have rubber bottoms. Are comfortable and fit you well. Are closed at the toe. Do not wear sandals. If you use a stepladder: Make sure that it is fully opened. Do not climb a closed stepladder. Make sure that both sides of the stepladder  are locked into place. Ask someone to hold it for you, if possible. Clearly mark and make sure that you can see: Any grab bars or handrails. First and last steps. Where the edge of each step is. Use tools that help you move around (mobility aids) if they are needed. These include: Canes. Walkers. Scooters. Crutches. Turn on the lights when you go into a dark area. Replace any light bulbs as soon as they burn out. Set up your furniture so you have a clear path. Avoid moving your furniture around. If any of your floors are uneven, fix them. If there are any pets around you, be aware of where they are. Review your medicines with your doctor. Some medicines can make you feel dizzy. This can increase your chance of falling. Ask your doctor what other things that you can do to help prevent falls. This information is not intended to replace advice given to you by your health care provider. Make sure you discuss any questions you have with your health care provider. Document Released: 07/24/2009 Document Revised: 03/04/2016 Document Reviewed: 11/01/2014 Elsevier Interactive Patient Education  2017 [employer].

## 2022-04-06 ENCOUNTER — Ambulatory Visit: Payer: Medicare (Managed Care)

## 2022-04-16 ENCOUNTER — Ambulatory Visit (INDEPENDENT_AMBULATORY_CARE_PROVIDER_SITE_OTHER): Payer: Medicare (Managed Care) | Admitting: Pharmacist

## 2022-04-16 VITALS — HR 65

## 2022-04-16 DIAGNOSIS — I10 Essential (primary) hypertension: Secondary | ICD-10-CM

## 2022-04-16 MED ORDER — CARVEDILOL 12.5 MG PO TABS: 12.5000 mg | ORAL_TABLET | Freq: Two times a day (BID) | ORAL | 3 refills | Status: DC

## 2022-04-16 MED ORDER — AMLODIPINE BESYLATE 2.5 MG PO TABS: 2.5000 mg | ORAL_TABLET | Freq: Every day | ORAL | 3 refills | Status: DC

## 2022-04-16 NOTE — Patient Instructions (Signed)
Try to increase physical activity with walking or biking STOP metoprolol START Carvedilol 12.'5mg'$  twice a day START amlodipine 2.'5mg'$  daily Continue losartan '100mg'$  daily and spironolactone '25mg'$  daily  Please start checking blood pressure at home 1-2 times a day

## 2022-04-16 NOTE — Progress Notes (Signed)
Patient ID: Laura Poole                 DOB: 09-02-1951                      MRN: [id number]     HPI: Laura Poole is a 71 y.o. female referred by Dr. Radford Pax to HTN clinic. PMH is significant for breast cancer, atypical chest pain, exertional dyspnea and GERD. 2D echo showed severe asymmetric septal hypertrophy and low normal LV function with EF 50 to 55% with grade 2 diastolic dysfunction but no LVOT gradient or Sam. Amlodipine was stopped recently due to swelling. Swelling improved. She saw Dr. Radford Pax 6/1. Blood pressure has been elevated. Losartan was increased to '100mg'$  daily. Follow up BMP was stable.  Patient presents today to HTN clinic. She has not been checking blood pressure at home. She does have a blood pressure cuff at home. 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  Causes stress. Takes care of her grandchildren. She works 3rd shift but states her job is not stressful. Is a receptionist at [employer]. Sleeps about 6-7 hr on the days that she works. Even more on the days she does not. Sometimes takes a 1/2 Ambien to sleep during the day. Sometimes drinks 1 glass of wine before going to bed after work. I have asked her not to combine wine and Ambien. Denies any swelling.   Does not get much exercise. Does her own yard work every 2 weeks, but it sounds like her neighbor helps her. Does not feel safe walking around her neighborhood bc of dogs. Is afraid of snakes outside. Is afraid if she walks too much her ankles will swell. Can use gym at work or get a free membership with insurance but does not.   Current HTN meds: losartan '100mg'$  daily, spironolactone '25mg'$  daily, metoprolol succinate '50mg'$  daily Previously tried: amlodipine (swelling), diazide (nervous and dizzy) BP goal: <130/80  Family History: The patient's family history includes Aneurysm in her brother; Asthma in an other family member; Breast cancer in her paternal grandmother; Coronary artery  disease in her mother; Heart attack (age of onset: 39) in her father; Hyperlipidemia in an other family member; Hypertension in her mother; Sudden death in an other family member.   Social History:  wine sometimes Social History   Socioeconomic History   Marital status: Single    Spouse name: Not on file   Number of children: Not on file   Years of education: 12   Highest education level: High school graduate  Occupational History   Not on file  Tobacco Use   Smoking status: Never   Smokeless tobacco: Never  Vaping Use   Vaping Use: Never used  Substance and Sexual Activity   Alcohol use: Yes    Alcohol/week: 2.0 standard drinks of alcohol    Types: 2 Cans of beer per week    Comment: rarely   Drug use: No   Sexual activity: Not on file  Other Topics Concern   Not on file  Social History Narrative   Not on file   Social Determinants of Health   Financial Resource Strain: Low Risk  (03/22/2022)   Overall Financial Resource Strain (CARDIA)    Difficulty of Paying Living Expenses: Not hard at all  Food Insecurity: No Food Insecurity (03/22/2022)   Hunger Vital Sign    Worried About Running Out of Food in the Last Year: Never true  Ran Out of Food in the Last Year: Never true  Transportation Needs: No Transportation Needs (03/22/2022)   PRAPARE - Hydrologist (Medical): No    Lack of Transportation (Non-Medical): No  Physical Activity: Inactive (03/22/2022)   Exercise Vital Sign    Days of Exercise per Week: 0 days    Minutes of Exercise per Session: 0 min  Stress: No Stress Concern Present (03/22/2022)   [place]    Feeling of Stress : Not at all  Social Connections: Moderately Integrated (03/22/2022)   Social Connection and Isolation Panel [NHANES]    Frequency of Communication with Friends and Family: More than three times a week    Frequency of Social Gatherings with  Friends and Family: More than three times a week    Attends Religious Services: More than 4 times per year    Active Member of Clubs or Organizations: No    Attends Music therapist: More than 4 times per year    Marital Status: Never married  Intimate Partner Violence: Not At Risk (03/22/2022)   Humiliation, Afraid, Rape, and Kick questionnaire    Fear of Current or Ex-Partner: No    Emotionally Abused: No    Physically Abused: No    Sexually Abused: No     Diet: lots of vegetables, rinse canned foods, limit salt, does like french fries, chef's salad with chicken, 1-2 cup of coffee, 1 coke per night, deli chicken  Exercise: yard work  Home BP readings: doesn't check  Wt Readings from Last 3 Encounters:  03/22/22 191 lb 9.6 oz (86.9 kg)  03/11/22 197 lb 12.8 oz (89.7 kg)  02/23/22 195 lb 3.2 oz (88.5 kg)   BP Readings from Last 3 Encounters:  03/22/22 (!) 160/80  03/11/22 (!) 164/88  02/23/22 (!) 148/90   Pulse Readings from Last 3 Encounters:  03/11/22 78  02/23/22 86  11/02/21 80    Renal function: CrCl cannot be calculated (Patient's most recent lab result is older than the maximum 21 days allowed.).  Past Medical History:  Diagnosis Date   Aortic stenosis    mild to moderate on echo 02/2021 with no AS on echo 02/2022   Bilateral leg cramps 09/01/2015   Breast cancer ([place])    left   Essential hypertension 01/07/2010   Qualifier: Diagnosis of  By: Linna Darner MD, Gwyndolyn Saxon     GERD (gastroesophageal reflux disease)    H/O: hysterectomy    History of breast cancer 2006   History of colonic polyps    HLD (hyperlipidemia)    Mitral regurgitation 08/05/2015   Mild to moderate by echo 02/2022   Personal history of chemotherapy 2006   Left Breast Cancer   Personal history of radiation therapy 2006   Left Breast Cancer   Snoring 03/30/2017    Current Outpatient Medications on File Prior to Visit  Medication Sig Dispense Refill   acetaminophen (TYLENOL) 500  MG tablet Take 500 mg by mouth every 6 (six) hours as needed for mild pain or headache.     albuterol (VENTOLIN HFA) 108 (90 Base) MCG/ACT inhaler Inhale 2 puffs into the lungs every 6 (six) hours as needed for wheezing or shortness of breath. 244 g 1   aspirin 81 MG tablet Take 1 tablet (81 mg total) by mouth daily. 30 tablet 6   COVID-19 mRNA bivalent vaccine, Moderna, (MODERNA COVID-19 BIVAL BOOSTER) 50 MCG/0.5ML injection Inject into the  muscle. 0.5 mL 0   diphenhydrAMINE (BENADRYL) 25 mg capsule Take 25 mg by mouth every 6 (six) hours as needed for allergies. (Patient not taking: Reported on 03/22/2022)     gabapentin (NEURONTIN) 300 MG capsule Take 1 capsule (300 mg total) by mouth at bedtime. 90 capsule 1   losartan (COZAAR) 100 MG tablet Take 1 tablet (100 mg total) by mouth daily. 90 tablet 3   meloxicam (MOBIC) 7.5 MG tablet Take 1 tablet (7.5 mg total) by mouth daily. 90 tablet 3   metoprolol succinate (TOPROL-XL) 50 MG 24 hr tablet Take 1 tablet (50 mg total) by mouth daily. Take with or immediately following a meal. (Patient taking differently: Take 50 mg by mouth in the morning and at bedtime. Take with or immediately following a meal.) 90 tablet 3   pantoprazole (PROTONIX) 40 MG tablet Take 1 tablet (40 mg total) by mouth daily. 90 tablet 3   rosuvastatin (CRESTOR) 10 MG tablet Take 1 tablet (10 mg total) by mouth daily. 90 tablet 3   solifenacin (VESICARE) 5 MG tablet Take 1 tablet (5 mg total) by mouth daily. 90 tablet 3   spironolactone (ALDACTONE) 25 MG tablet Take 1 tablet (25 mg total) by mouth 2 (two) times daily. 180 tablet 3   tiZANidine (ZANAFLEX) 2 MG tablet TAKE 1 TABLET EVERY 6 HOURS AS NEEDED FOR MUSCLE SPASMS 360 tablet 3   triamcinolone (NASACORT) 55 MCG/ACT AERO nasal inhaler Place 2 sprays into the nose daily. 1 Inhaler 12   vitamin C (ASCORBIC ACID) 500 MG tablet Take 500 mg by mouth daily.     zolpidem (AMBIEN) 10 MG tablet TAKE ONE TABLET AT BEDTIME AS NEEDED FOR  SLEEP 90 tablet 1   No current facility-administered medications on file prior to visit.    No Known Allergies   Assessment/Plan:  1. Hypertension - Blood pressure is elevated in clinic today, above goal of <130/80. Patient is not checking at home so no home reading available. We discussed several options. Will try a retrial of a lower dose of amlodipine. Start amlodipine 2.'5mg'$  daily. STOP metoprolol and start carvedilol 12.'5mg'$  BID for better BP lowering with alpha blockade. Continue losartan '100mg'$  daily and spironolactone. Check blood pressure 1-2 times per day. Bring in BP cuff and readings to next visit. I have asked patient to increase her activity, suggested biking. We discussed avoiding processed meats and soda.  Follow up in 3 weeks.  Thank you,  Ramond Dial, Pharm.D, BCPS, CPP [place]  [address], [place], [place] [address]  Phone: [phone]; Fax: [phone]

## 2022-05-04 ENCOUNTER — Ambulatory Visit (INDEPENDENT_AMBULATORY_CARE_PROVIDER_SITE_OTHER): Payer: Medicare (Managed Care) | Admitting: Pharmacist

## 2022-05-04 VITALS — BP 110/70 | HR 68

## 2022-05-04 DIAGNOSIS — I10 Essential (primary) hypertension: Secondary | ICD-10-CM | POA: Diagnosis not present

## 2022-05-04 NOTE — Patient Instructions (Addendum)
Please pick up an upper arm blood pressure cuff Continue checking blood pressure at home- make sure to rest 5 min first Continue losartan '100mg'$  daily, spironolactone '25mg'$  daily, carvedilol 12.'5mg'$  twice a day, amlodipine 2.'5mg'$  daily  Your blood pressure goal is <130/80  To check your pressure at home you will need to:  1. Sit up in a chair, with feet flat on the floor and back supported. Do not cross your ankles or legs. 2. Rest your left arm so that the cuff is about heart level. If the cuff goes on your upper arm,  then just relax the arm on the table, arm of the chair or your lap. If you have a wrist cuff, we  suggest relaxing your wrist against your chest (think of it as Pledging the Flag with the  wrong arm).  3. Place the cuff snugly around your arm, about 1 inch above the crook of your elbow. The  cords should be inside the groove of your elbow.  4. Sit quietly, with the cuff in place, for about 5 minutes. After that 5 minutes press the power  button to start a reading. 5. Do not talk or move while the reading is taking place.  6. Record your readings on a sheet of paper. Although most cuffs have a memory, it is often  easier to see a pattern developing when the numbers are all in front of you.  7. You can repeat the reading after 1-3 minutes if it is recommended  Make sure your bladder is empty and you have not had caffeine or tobacco within the last 30 min  Always bring your blood pressure log with you to your appointments. If you have not brought your monitor in to be double checked for accuracy, please bring it to your next appointment.  You can find a list of validated (accurate) blood pressure cuffs at validatebp.org

## 2022-05-04 NOTE — Progress Notes (Signed)
Patient ID: Laura Poole                 DOB: 1951/10/04                      MRN: [id number]     HPI: Carli Lefevers is a 71 y.o. female referred by Dr. Radford Pax to HTN clinic. PMH is significant for breast cancer, atypical chest pain, exertional dyspnea and GERD. 2D echo showed severe asymmetric septal hypertrophy and low normal LV function with EF 50 to 55% with grade 2 diastolic dysfunction but no LVOT gradient or Sam. Amlodipine was stopped recently due to swelling. Swelling improved. She saw Dr. Radford Pax 6/1. Blood pressure has been elevated. Losartan was increased to '100mg'$  daily. Follow up BMP was stable.  At last HTN visit metoprolol was switched to carvedilol 12.'5mg'$  BID and low dose amlodipine 2.'5mg'$  daily was started. Patient has hx of swelling with amlodipine. No home BP readings. She reported stress at home because everyone depends on her. 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 I asked her to increase her exercise.  Patient presents today for follow up. She has been checking her blood pressure some at home. Forgot her entire log book, but had some readings. She has a wrist cuff. Denies dizziness/lightheadedness or swelling. Has started exercising. Walking on treadmill for 30 min 3 times a week. Has cut way back on soda. Only 1 per week (instead of 2 per day). Has been watching her salt. Reports she is sleeping well.  Blood pressure in clinic was 130/80 initially and then 110/70 on recheck.    Current HTN meds: losartan '100mg'$  daily, spironolactone '25mg'$  daily, carvedilol 12.'5mg'$  twice a day, amlodipine 2.'5mg'$  daily Previously tried: amlodipine (swelling), diazide (nervous and dizzy) BP goal: <130/80  Family History: The patient's family history includes Aneurysm in her brother; Asthma in an other family member; Breast cancer in her paternal grandmother; Coronary artery disease in her mother; Heart attack (age of onset: 9) in her father; Hyperlipidemia in an other family member;  Hypertension in her mother; Sudden death in an other family member.   Social History:  wine sometimes Social History   Socioeconomic History   Marital status: Single    Spouse name: Not on file   Number of children: Not on file   Years of education: 12   Highest education level: High school graduate  Occupational History   Not on file  Tobacco Use   Smoking status: Never   Smokeless tobacco: Never  Vaping Use   Vaping Use: Never used  Substance and Sexual Activity   Alcohol use: Yes    Alcohol/week: 2.0 standard drinks of alcohol    Types: 2 Cans of beer per week    Comment: rarely   Drug use: No   Sexual activity: Not on file  Other Topics Concern   Not on file  Social History Narrative   Not on file   Social Determinants of Health   Financial Resource Strain: Low Risk  (03/22/2022)   Overall Financial Resource Strain (CARDIA)    Difficulty of Paying Living Expenses: Not hard at all  Food Insecurity: No Food Insecurity (03/22/2022)   Hunger Vital Sign    Worried About Running Out of Food in the Last Year: Never true    Ran Out of Food in the Last Year: Never true  Transportation Needs: No Transportation Needs (03/22/2022)   PRAPARE - Transportation    Lack of Transportation (  Medical): No    Lack of Transportation (Non-Medical): No  Physical Activity: Inactive (03/22/2022)   Exercise Vital Sign    Days of Exercise per Week: 0 days    Minutes of Exercise per Session: 0 min  Stress: No Stress Concern Present (03/22/2022)   [place]    Feeling of Stress : Not at all  Social Connections: Moderately Integrated (03/22/2022)   Social Connection and Isolation Panel [NHANES]    Frequency of Communication with Friends and Family: More than three times a week    Frequency of Social Gatherings with Friends and Family: More than three times a week    Attends Religious Services: More than 4 times per year    Active  Member of Clubs or Organizations: No    Attends Music therapist: More than 4 times per year    Marital Status: Never married  Intimate Partner Violence: Not At Risk (03/22/2022)   Humiliation, Afraid, Rape, and Kick questionnaire    Fear of Current or Ex-Partner: No    Emotionally Abused: No    Physically Abused: No    Sexually Abused: No     Diet: lots of vegetables, rinse canned foods, limit salt, does like french fries, chef's salad with chicken, 1-2 cup of coffee, 1 coke per night, deli chicken  Exercise: yard work, walking 30 min 3 days a week  Home BP readings: 131/75, 136/86, 145/99, 98/58  Wt Readings from Last 3 Encounters:  03/22/22 191 lb 9.6 oz (86.9 kg)  03/11/22 197 lb 12.8 oz (89.7 kg)  02/23/22 195 lb 3.2 oz (88.5 kg)   BP Readings from Last 3 Encounters:  03/22/22 (!) 160/80  03/11/22 (!) 164/88  02/23/22 (!) 148/90   Pulse Readings from Last 3 Encounters:  04/16/22 65  03/11/22 78  02/23/22 86    Renal function: CrCl cannot be calculated (Patient's most recent lab result is older than the maximum 21 days allowed.).  Past Medical History:  Diagnosis Date   Aortic stenosis    mild to moderate on echo 02/2021 with no AS on echo 02/2022   Bilateral leg cramps 09/01/2015   Breast cancer ([place])    left   Essential hypertension 01/07/2010   Qualifier: Diagnosis of  By: Linna Darner MD, Gwyndolyn Saxon     GERD (gastroesophageal reflux disease)    H/O: hysterectomy    History of breast cancer 2006   History of colonic polyps    HLD (hyperlipidemia)    Mitral regurgitation 08/05/2015   Mild to moderate by echo 02/2022   Personal history of chemotherapy 2006   Left Breast Cancer   Personal history of radiation therapy 2006   Left Breast Cancer   Snoring 03/30/2017    Current Outpatient Medications on File Prior to Visit  Medication Sig Dispense Refill   acetaminophen (TYLENOL) 500 MG tablet Take 500 mg by mouth every 6 (six) hours as needed for mild  pain or headache.     albuterol (VENTOLIN HFA) 108 (90 Base) MCG/ACT inhaler Inhale 2 puffs into the lungs every 6 (six) hours as needed for wheezing or shortness of breath. 244 g 1   amLODipine (NORVASC) 2.5 MG tablet Take 1 tablet (2.5 mg total) by mouth daily. 180 tablet 3   aspirin 81 MG tablet Take 1 tablet (81 mg total) by mouth daily. 30 tablet 6   carvedilol (COREG) 12.5 MG tablet Take 1 tablet (12.5 mg total) by mouth 2 (two)  times daily. 180 tablet 3   COVID-19 mRNA bivalent vaccine, Moderna, (MODERNA COVID-19 BIVAL BOOSTER) 50 MCG/0.5ML injection Inject into the muscle. 0.5 mL 0   diphenhydrAMINE (BENADRYL) 25 mg capsule Take 25 mg by mouth every 6 (six) hours as needed for allergies. (Patient not taking: Reported on 03/22/2022)     gabapentin (NEURONTIN) 300 MG capsule Take 1 capsule (300 mg total) by mouth at bedtime. 90 capsule 1   losartan (COZAAR) 100 MG tablet Take 1 tablet (100 mg total) by mouth daily. 90 tablet 3   meloxicam (MOBIC) 7.5 MG tablet Take 1 tablet (7.5 mg total) by mouth daily. 90 tablet 3   pantoprazole (PROTONIX) 40 MG tablet Take 1 tablet (40 mg total) by mouth daily. 90 tablet 3   rosuvastatin (CRESTOR) 10 MG tablet Take 1 tablet (10 mg total) by mouth daily. 90 tablet 3   solifenacin (VESICARE) 5 MG tablet Take 1 tablet (5 mg total) by mouth daily. 90 tablet 3   spironolactone (ALDACTONE) 25 MG tablet Take 1 tablet (25 mg total) by mouth 2 (two) times daily. 180 tablet 3   tiZANidine (ZANAFLEX) 2 MG tablet TAKE 1 TABLET EVERY 6 HOURS AS NEEDED FOR MUSCLE SPASMS 360 tablet 3   triamcinolone (NASACORT) 55 MCG/ACT AERO nasal inhaler Place 2 sprays into the nose daily. 1 Inhaler 12   vitamin C (ASCORBIC ACID) 500 MG tablet Take 500 mg by mouth daily.     zolpidem (AMBIEN) 10 MG tablet TAKE ONE TABLET AT BEDTIME AS NEEDED FOR SLEEP 90 tablet 1   No current facility-administered medications on file prior to visit.    No Known Allergies   Assessment/Plan:  1.  Hypertension - Blood pressure is at goal in clinic today. I have congratulated patient on her diet changes and her increase in exercise. She is not sure her cuff is accurate so she said she is going to buy a new upper arm cuff. Continue losartan '100mg'$  daily, spironolactone '25mg'$  daily, carvedilol 12.'5mg'$  twice a day and amlodipine 2.'5mg'$  daily. Check blood pressure at home with new cuff. Instructions reviewed. Follow up in clinic in 4 weeks.  Thank you,  Ramond Dial, Pharm.D, BCPS, CPP [place]  [address] [place], [place] [address]  Phone: [phone]; Fax: [phone]

## 2022-05-11 ENCOUNTER — Telehealth: Payer: Self-pay | Admitting: Internal Medicine

## 2022-05-11 DIAGNOSIS — G4700 Insomnia, unspecified: Secondary | ICD-10-CM

## 2022-05-11 MED ORDER — ZOLPIDEM TARTRATE 10 MG PO TABS: ORAL_TABLET | ORAL | 1 refills | Status: DC

## 2022-05-11 NOTE — Telephone Encounter (Signed)
Patient notified and received a email from [facility] as well.

## 2022-05-11 NOTE — Telephone Encounter (Signed)
Patient needs a refill on her Zolpiem - generic of ambien - Please call into [facility].  Next visit :  11/03/2022  Last Visit:  11/02/2021

## 2022-05-11 NOTE — Telephone Encounter (Signed)
Ok done erx [facility]

## 2022-05-31 NOTE — Progress Notes (Unsigned)
Patient ID: Laura Poole                 DOB: June 18, 1951                      MRN: [id number]     HPI: Laura Poole is a 71 y.o. female referred by Dr. Radford Pax to HTN clinic. PMH is significant for breast cancer, atypical chest pain, exertional dyspnea and GERD. 2D echo showed severe asymmetric septal hypertrophy and low normal LV function with EF 50 to 55% with grade 2 diastolic dysfunction but no LVOT gradient or Sam. Amlodipine was stopped recently due to swelling. Swelling improved. She saw Dr. Radford Pax 6/1. Blood pressure has been elevated. Losartan was increased to '100mg'$  daily. Follow up BMP was stable.  At HTN clinic 7/7 visit metoprolol was switched to carvedilol 12.'5mg'$  BID and low dose amlodipine 2.'5mg'$  daily was started. Patient has hx of swelling with amlodipine. She reported stress at home because everyone depends on her. 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   At HTN clinic 7/25, blood pressure in clinic was 130/80 initially and then 110/70 on recheck. Pt reported cutting back on soda and walking on treadmill for 30 min 3 times a week and watching salt intake. Pt was continued on losartan 100 mg daily, spironolactone 25 mg daily, carvedilol 12.5 mg twice a day, amlodipine 2.5 mg daily. Pt was instructed to check BP at home with new upper arm cuff since pt was not sure current wrist cuff was accurate.  Ask about home bps Ask about tolerating medications  Any swelling, dizziness, headache, changes in vision  Consider 5 mg amlodipine if needed     Current HTN meds: losartan '100mg'$  daily, spironolactone '25mg'$  daily, carvedilol 12.'5mg'$  twice a day, amlodipine 2.'5mg'$  daily Previously tried: amlodipine (swelling), diazide (nervous and dizzy) BP goal: <130/80  Family History: The patient's family history includes Aneurysm in her brother; Asthma in an other family member; Breast cancer in her paternal grandmother; Coronary artery disease in her mother; Heart attack (age of onset:  64) in her father; Hyperlipidemia in an other family member; Hypertension in her mother; Sudden death in an other family member.   Social History:  wine sometimes Social History   Socioeconomic History   Marital status: Single    Spouse name: Not on file   Number of children: Not on file   Years of education: 12   Highest education level: High school graduate  Occupational History   Not on file  Tobacco Use   Smoking status: Never   Smokeless tobacco: Never  Vaping Use   Vaping Use: Never used  Substance and Sexual Activity   Alcohol use: Yes    Alcohol/week: 2.0 standard drinks of alcohol    Types: 2 Cans of beer per week    Comment: rarely   Drug use: No   Sexual activity: Not on file  Other Topics Concern   Not on file  Social History Narrative   Not on file   Social Determinants of Health   Financial Resource Strain: Low Risk  (03/22/2022)   Overall Financial Resource Strain (CARDIA)    Difficulty of Paying Living Expenses: Not hard at all  Food Insecurity: No Food Insecurity (03/22/2022)   Hunger Vital Sign    Worried About Running Out of Food in the Last Year: Never true    Ran Out of Food in the Last Year: Never true  Transportation Needs: No Transportation  Needs (03/22/2022)   PRAPARE - Hydrologist (Medical): No    Lack of Transportation (Non-Medical): No  Physical Activity: Inactive (03/22/2022)   Exercise Vital Sign    Days of Exercise per Week: 0 days    Minutes of Exercise per Session: 0 min  Stress: No Stress Concern Present (03/22/2022)   [place]    Feeling of Stress : Not at all  Social Connections: Moderately Integrated (03/22/2022)   Social Connection and Isolation Panel [NHANES]    Frequency of Communication with Friends and Family: More than three times a week    Frequency of Social Gatherings with Friends and Family: More than three times a week     Attends Religious Services: More than 4 times per year    Active Member of [employer] or Organizations: No    Attends Music therapist: More than 4 times per year    Marital Status: Never married  Intimate Partner Violence: Not At Risk (03/22/2022)   Humiliation, Afraid, Rape, and Kick questionnaire    Fear of Current or Ex-Partner: No    Emotionally Abused: No    Physically Abused: No    Sexually Abused: No     Diet: lots of vegetables, rinse canned foods, limit salt, does like french fries, chef's salad with chicken, 1-2 cup of coffee, 1 coke per night, deli chicken  Exercise: yard work, walking 30 min 3 days a week  Home BP readings:   Wt Readings from Last 3 Encounters:  03/22/22 191 lb 9.6 oz (86.9 kg)  03/11/22 197 lb 12.8 oz (89.7 kg)  02/23/22 195 lb 3.2 oz (88.5 kg)   BP Readings from Last 3 Encounters:  05/04/22 110/70  03/22/22 (!) 160/80  03/11/22 (!) 164/88   Pulse Readings from Last 3 Encounters:  05/04/22 68  04/16/22 65  03/11/22 78    Renal function: CrCl cannot be calculated (Patient's most recent lab result is older than the maximum 21 days allowed.).  Past Medical History:  Diagnosis Date   Aortic stenosis    mild to moderate on echo 02/2021 with no AS on echo 02/2022   Bilateral leg cramps 09/01/2015   Breast cancer ([place])    left   Essential hypertension 01/07/2010   Qualifier: Diagnosis of  By: Linna Darner MD, Gwyndolyn Saxon     GERD (gastroesophageal reflux disease)    H/O: hysterectomy    History of breast cancer 2006   History of colonic polyps    HLD (hyperlipidemia)    Mitral regurgitation 08/05/2015   Mild to moderate by echo 02/2022   Personal history of chemotherapy 2006   Left Breast Cancer   Personal history of radiation therapy 2006   Left Breast Cancer   Snoring 03/30/2017    Current Outpatient Medications on File Prior to Visit  Medication Sig Dispense Refill   acetaminophen (TYLENOL) 500 MG tablet Take 500 mg by mouth every 6  (six) hours as needed for mild pain or headache.     albuterol (VENTOLIN HFA) 108 (90 Base) MCG/ACT inhaler Inhale 2 puffs into the lungs every 6 (six) hours as needed for wheezing or shortness of breath. 244 g 1   amLODipine (NORVASC) 2.5 MG tablet Take 1 tablet (2.5 mg total) by mouth daily. 180 tablet 3   aspirin 81 MG tablet Take 1 tablet (81 mg total) by mouth daily. 30 tablet 6   carvedilol (COREG) 12.5 MG tablet Take  1 tablet (12.5 mg total) by mouth 2 (two) times daily. 180 tablet 3   COVID-19 mRNA bivalent vaccine, Moderna, (MODERNA COVID-19 BIVAL BOOSTER) 50 MCG/0.5ML injection Inject into the muscle. 0.5 mL 0   diphenhydrAMINE (BENADRYL) 25 mg capsule Take 25 mg by mouth every 6 (six) hours as needed for allergies. (Patient not taking: Reported on 03/22/2022)     gabapentin (NEURONTIN) 300 MG capsule Take 1 capsule (300 mg total) by mouth at bedtime. 90 capsule 1   losartan (COZAAR) 100 MG tablet Take 1 tablet (100 mg total) by mouth daily. 90 tablet 3   meloxicam (MOBIC) 7.5 MG tablet Take 1 tablet (7.5 mg total) by mouth daily. 90 tablet 3   pantoprazole (PROTONIX) 40 MG tablet Take 1 tablet (40 mg total) by mouth daily. 90 tablet 3   rosuvastatin (CRESTOR) 10 MG tablet Take 1 tablet (10 mg total) by mouth daily. 90 tablet 3   solifenacin (VESICARE) 5 MG tablet Take 1 tablet (5 mg total) by mouth daily. 90 tablet 3   spironolactone (ALDACTONE) 25 MG tablet Take 1 tablet (25 mg total) by mouth 2 (two) times daily. 180 tablet 3   tiZANidine (ZANAFLEX) 2 MG tablet TAKE 1 TABLET EVERY 6 HOURS AS NEEDED FOR MUSCLE SPASMS 360 tablet 3   triamcinolone (NASACORT) 55 MCG/ACT AERO nasal inhaler Place 2 sprays into the nose daily. 1 Inhaler 12   vitamin C (ASCORBIC ACID) 500 MG tablet Take 500 mg by mouth daily.     zolpidem (AMBIEN) 10 MG tablet TAKE ONE TABLET AT BEDTIME AS NEEDED FOR SLEEP 90 tablet 1   No current facility-administered medications on file prior to visit.    No Known  Allergies   Assessment/Plan:  1. Hypertension -   Thank you,  Eliseo Gum, PharmD PGY1 Pharmacy Resident   05/31/2022  9:05 PM   Ramond Dial, Pharm.D, BCPS, CPP [place]  [address], [place], [place] [address]  Phone: [phone]; Fax: [phone]

## 2022-06-01 ENCOUNTER — Ambulatory Visit: Payer: Medicare (Managed Care)

## 2022-06-21 DIAGNOSIS — Z0000 Encounter for general adult medical examination without abnormal findings: Secondary | ICD-10-CM | POA: Diagnosis not present

## 2022-06-30 ENCOUNTER — Ambulatory Visit: Payer: Medicare (Managed Care) | Admitting: Internal Medicine

## 2022-07-06 ENCOUNTER — Ambulatory Visit (INDEPENDENT_AMBULATORY_CARE_PROVIDER_SITE_OTHER): Payer: Medicare (Managed Care)

## 2022-07-06 DIAGNOSIS — Z23 Encounter for immunization: Secondary | ICD-10-CM | POA: Diagnosis not present

## 2022-07-29 NOTE — Progress Notes (Unsigned)
Patient ID: Laura Poole                 DOB: [birth year]                      MRN: [id number]     HPI: Laura Poole is a 71 y.o. female referred by Dr. Radford Pax to HTN clinic. PMH is significant for breast cancer, atypical chest pain, exertional dyspnea and GERD. 2D echo showed severe asymmetric septal hypertrophy and low normal LV function with EF 50 to 55% with grade 2 diastolic dysfunction but no LVOT gradient or Sam. Amlodipine was stopped recently due to swelling. Swelling improved. She saw Dr. Radford Pax 6/1. Blood pressure has been elevated. Losartan was increased to '100mg'$  daily. Follow up BMP was stable.  At last HTN visit metoprolol was switched to carvedilol 12.'5mg'$  BID and low dose amlodipine 2.'5mg'$  daily was started. Patient has hx of swelling with amlodipine. No home BP readings. She reported stress at home because everyone depends on her. 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 I asked her to increase her exercise.  Patient presents today for follow up. She has been checking her blood pressure some at home. Forgot her entire log book, but had some readings. She has a wrist cuff. Denies dizziness/lightheadedness or swelling. Has started exercising. Walking on treadmill for 30 min 3 times a week. Has cut way back on soda. Only 1 per week (instead of 2 per day). Has been watching her salt. Reports she is sleeping well.  Blood pressure in clinic was 130/80 initially and then 110/70 on recheck.    Current HTN meds: losartan '100mg'$  daily, spironolactone '25mg'$  daily, carvedilol 12.'5mg'$  twice a day, amlodipine 2.'5mg'$  daily Previously tried: amlodipine (swelling), diazide (nervous and dizzy) BP goal: <130/80  Family History: The patient's family history includes Aneurysm in her brother; Asthma in an other family member; Breast cancer in her paternal grandmother; Coronary artery disease in her mother; Heart attack (age of onset: 21) in her father; Hyperlipidemia in an other family member;  Hypertension in her mother; Sudden death in an other family member.   Social History:  wine sometimes Social History   Socioeconomic History   Marital status: Single    Spouse name: Not on file   Number of children: Not on file   Years of education: 12   Highest education level: High school graduate  Occupational History   Not on file  Tobacco Use   Smoking status: Never   Smokeless tobacco: Never  Vaping Use   Vaping Use: Never used  Substance and Sexual Activity   Alcohol use: Yes    Alcohol/week: 2.0 standard drinks of alcohol    Types: 2 Cans of beer per week    Comment: rarely   Drug use: No   Sexual activity: Not on file  Other Topics Concern   Not on file  Social History Narrative   Not on file   Social Determinants of Health   Financial Resource Strain: Low Risk  (03/22/2022)   Overall Financial Resource Strain (CARDIA)    Difficulty of Paying Living Expenses: Not hard at all  Food Insecurity: No Food Insecurity (03/22/2022)   Hunger Vital Sign    Worried About Running Out of Food in the Last Year: Never true    Ran Out of Food in the Last Year: Never true  Transportation Needs: No Transportation Needs (03/22/2022)   PRAPARE - Transportation    Lack of Transportation (  Medical): No    Lack of Transportation (Non-Medical): No  Physical Activity: Inactive (03/22/2022)   Exercise Vital Sign    Days of Exercise per Week: 0 days    Minutes of Exercise per Session: 0 min  Stress: No Stress Concern Present (03/22/2022)   [place]    Feeling of Stress : Not at all  Social Connections: Moderately Integrated (03/22/2022)   Social Connection and Isolation Panel [NHANES]    Frequency of Communication with Friends and Family: More than three times a week    Frequency of Social Gatherings with Friends and Family: More than three times a week    Attends Religious Services: More than 4 times per year    Active  Member of [employer] or Organizations: No    Attends Music therapist: More than 4 times per year    Marital Status: Never married  Intimate Partner Violence: Not At Risk (03/22/2022)   Humiliation, Afraid, Rape, and Kick questionnaire    Fear of Current or Ex-Partner: No    Emotionally Abused: No    Physically Abused: No    Sexually Abused: No     Diet: lots of vegetables, rinse canned foods, limit salt, does like french fries, chef's salad with chicken, 1-2 cup of coffee, 1 coke per night, deli chicken  Exercise: yard work, walking 30 min 3 days a week  Home BP readings: 131/75, 136/86, 145/99, 98/58  Wt Readings from Last 3 Encounters:  03/22/22 191 lb 9.6 oz (86.9 kg)  03/11/22 197 lb 12.8 oz (89.7 kg)  02/23/22 195 lb 3.2 oz (88.5 kg)   BP Readings from Last 3 Encounters:  05/04/22 110/70  03/22/22 (!) 160/80  03/11/22 (!) 164/88   Pulse Readings from Last 3 Encounters:  05/04/22 68  04/16/22 65  03/11/22 78    Renal function: CrCl cannot be calculated (Patient's most recent lab result is older than the maximum 21 days allowed.).  Past Medical History:  Diagnosis Date   Aortic stenosis    mild to moderate on echo 02/2021 with no AS on echo 02/2022   Bilateral leg cramps 09/01/2015   Breast cancer ([place])    left   Essential hypertension 01/07/2010   Qualifier: Diagnosis of  By: Linna Darner MD, Gwyndolyn Saxon     GERD (gastroesophageal reflux disease)    H/O: hysterectomy    History of breast cancer 2006   History of colonic polyps    HLD (hyperlipidemia)    Mitral regurgitation 08/05/2015   Mild to moderate by echo 02/2022   Personal history of chemotherapy 2006   Left Breast Cancer   Personal history of radiation therapy 2006   Left Breast Cancer   Snoring 03/30/2017    Current Outpatient Medications on File Prior to Visit  Medication Sig Dispense Refill   acetaminophen (TYLENOL) 500 MG tablet Take 500 mg by mouth every 6 (six) hours as needed for mild pain  or headache.     albuterol (VENTOLIN HFA) 108 (90 Base) MCG/ACT inhaler Inhale 2 puffs into the lungs every 6 (six) hours as needed for wheezing or shortness of breath. 244 g 1   amLODipine (NORVASC) 2.5 MG tablet Take 1 tablet (2.5 mg total) by mouth daily. 180 tablet 3   aspirin 81 MG tablet Take 1 tablet (81 mg total) by mouth daily. 30 tablet 6   carvedilol (COREG) 12.5 MG tablet Take 1 tablet (12.5 mg total) by mouth 2 (two) times  daily. 180 tablet 3   COVID-19 mRNA bivalent vaccine, Moderna, (MODERNA COVID-19 BIVAL BOOSTER) 50 MCG/0.5ML injection Inject into the muscle. 0.5 mL 0   diphenhydrAMINE (BENADRYL) 25 mg capsule Take 25 mg by mouth every 6 (six) hours as needed for allergies. (Patient not taking: Reported on 03/22/2022)     gabapentin (NEURONTIN) 300 MG capsule Take 1 capsule (300 mg total) by mouth at bedtime. 90 capsule 1   losartan (COZAAR) 100 MG tablet Take 1 tablet (100 mg total) by mouth daily. 90 tablet 3   meloxicam (MOBIC) 7.5 MG tablet Take 1 tablet (7.5 mg total) by mouth daily. 90 tablet 3   pantoprazole (PROTONIX) 40 MG tablet Take 1 tablet (40 mg total) by mouth daily. 90 tablet 3   rosuvastatin (CRESTOR) 10 MG tablet Take 1 tablet (10 mg total) by mouth daily. 90 tablet 3   solifenacin (VESICARE) 5 MG tablet Take 1 tablet (5 mg total) by mouth daily. 90 tablet 3   spironolactone (ALDACTONE) 25 MG tablet Take 1 tablet (25 mg total) by mouth 2 (two) times daily. 180 tablet 3   tiZANidine (ZANAFLEX) 2 MG tablet TAKE 1 TABLET EVERY 6 HOURS AS NEEDED FOR MUSCLE SPASMS 360 tablet 3   triamcinolone (NASACORT) 55 MCG/ACT AERO nasal inhaler Place 2 sprays into the nose daily. 1 Inhaler 12   vitamin C (ASCORBIC ACID) 500 MG tablet Take 500 mg by mouth daily.     zolpidem (AMBIEN) 10 MG tablet TAKE ONE TABLET AT BEDTIME AS NEEDED FOR SLEEP 90 tablet 1   No current facility-administered medications on file prior to visit.    No Known Allergies   Assessment/Plan:  1.  Hypertension - Blood pressure is at goal in clinic today. I have congratulated patient on her diet changes and her increase in exercise. She is not sure her cuff is accurate so she said she is going to buy a new upper arm cuff. Continue losartan '100mg'$  daily, spironolactone '25mg'$  daily, carvedilol 12.'5mg'$  twice a day and amlodipine 2.'5mg'$  daily. Check blood pressure at home with new cuff. Instructions reviewed. Follow up in clinic in 4 weeks.  Thank you,  Ramond Dial, Pharm.D, BCPS, CPP [facility]  [address] [place], [place] [address]  Phone: [phone]; Fax: [phone]

## 2022-07-30 ENCOUNTER — Ambulatory Visit: Payer: Medicare (Managed Care) | Attending: Internal Medicine | Admitting: Pharmacist

## 2022-07-30 VITALS — BP 153/97 | HR 87 | Wt 189.8 lb

## 2022-07-30 DIAGNOSIS — I10 Essential (primary) hypertension: Secondary | ICD-10-CM | POA: Diagnosis not present

## 2022-07-30 NOTE — Patient Instructions (Addendum)
It was nic3e meeting you today  We would like your blood pressure to be less than 130/80  Please continue your: Losartan '100mg'$  daily Carvedilol 12.'5mg'$  twice a day Spironolactone '25mg'$  twice a day  I would like to change your amlodipine to '5mg'$ .  You can take 1/2 of your '10mg'$  tablets  Consider switching Coke to Zevia, which is a sugar free soda  We would like to see you back in a month  Karren Cobble, PharmD, BCACP, [place], [place] [address], [place],  [address] Phone: [phone]; Fax: [phone] 07/30/2022 9:19 AM

## 2022-08-03 ENCOUNTER — Encounter: Payer: Self-pay | Admitting: Pharmacist

## 2022-08-24 ENCOUNTER — Other Ambulatory Visit: Payer: Self-pay | Admitting: Internal Medicine

## 2022-08-24 NOTE — Telephone Encounter (Signed)
Patient also called to let you know that she needed this rx refilled.

## 2022-09-01 ENCOUNTER — Ambulatory Visit: Payer: Medicare (Managed Care) | Attending: Cardiology | Admitting: Pharmacist

## 2022-09-01 ENCOUNTER — Encounter: Payer: Self-pay | Admitting: Pharmacist

## 2022-09-01 VITALS — BP 113/74 | HR 75

## 2022-09-01 DIAGNOSIS — Z853 Personal history of malignant neoplasm of breast: Secondary | ICD-10-CM

## 2022-09-01 DIAGNOSIS — I10 Essential (primary) hypertension: Secondary | ICD-10-CM

## 2022-09-01 NOTE — Patient Instructions (Addendum)
It was good seeing you again  Your blood pressure is perfect today.  Keep up the good work!  Please continue your:  Amlodipine '5mg'$  daily Carvedilol 12.'5mg'$  twice a day Losartan '100mg'$  daily Spironolactone '25mg'$  daily  Continue to watch your salt intake and reduce how many sodas you are drinking  We can update your complete blood count today  Please give [place] a call or send a message with any questions.  I will see you back in 3 months  Karren Cobble, PharmD, [place], [place], [place], [place], [place], [address] Phone: [phone], Fax: [phone]

## 2022-09-01 NOTE — Progress Notes (Unsigned)
Patient ID: Laura Poole                 DOB: 10-30-1950                      MRN: [id number]      HPI: Laura Poole is a 71 y.o. female referred by Dr. Radford Pax to HTN clinic. PMH is significant for breast cancer, atypical chest pain, exertional dyspnea and GERD.   Patient presents today for fourth visit with HTN clinic.  Works third shift and sleeping pattern is irregular. Has not gone to sleep yet today.    Has been working on diet changes since last visit. Has reduced how many sodas she is drinking and is not drinking as much wine.   Her friend was recently diagnosed with cancer so she is very nervous. Has not been checking her BP regularly however but reports she feels well. Does not remember any home readings. Amlodipine was increased at last visit. No adverse effects.   Current HTN meds: Losartan '100mg'$  daily Spironolactone '25mg'$  daily Carvedilol 12.'5mg'$  BID Amlodipine '5mg'$  daily  BP goal: <130/80  Social History   Socioeconomic History   Marital status: Single    Spouse name: Not on file   Number of children: Not on file   Years of education: 12   Highest education level: High school graduate  Occupational History   Not on file  Tobacco Use   Smoking status: Never   Smokeless tobacco: Never  Vaping Use   Vaping Use: Never used  Substance and Sexual Activity   Alcohol use: Yes    Alcohol/week: 2.0 standard drinks of alcohol    Types: 2 Cans of beer per week    Comment: rarely   Drug use: No   Sexual activity: Not on file  Other Topics Concern   Not on file  Social History Narrative   Not on file   Social Determinants of Health   Financial Resource Strain: Low Risk  (03/22/2022)   Overall Financial Resource Strain (CARDIA)    Difficulty of Paying Living Expenses: Not hard at all  Food Insecurity: No Food Insecurity (03/22/2022)   Hunger Vital Sign    Worried About Running Out of Food in the Last Year: Never true    Ran Out of Food in the Last Year: Never  true  Transportation Needs: No Transportation Needs (03/22/2022)   PRAPARE - Hydrologist (Medical): No    Lack of Transportation (Non-Medical): No  Physical Activity: Inactive (03/22/2022)   Exercise Vital Sign    Days of Exercise per Week: 0 days    Minutes of Exercise per Session: 0 min  Stress: No Stress Concern Present (03/22/2022)   [place]    Feeling of Stress : Not at all  Social Connections: Moderately Integrated (03/22/2022)   Social Connection and Isolation Panel [NHANES]    Frequency of Communication with Friends and Family: More than three times a week    Frequency of Social Gatherings with Friends and Family: More than three times a week    Attends Religious Services: More than 4 times per year    Active Member of [employer] or Organizations: No    Attends Music therapist: More than 4 times per year    Marital Status: Never married  Intimate Partner Violence: Not At Risk (03/22/2022)   Humiliation, Afraid, Rape, and Kick questionnaire  Fear of Current or Ex-Partner: No    Emotionally Abused: No    Physically Abused: No    Sexually Abused: No     Wt Readings from Last 3 Encounters:  07/30/22 189 lb 12.8 oz (86.1 kg)  03/22/22 191 lb 9.6 oz (86.9 kg)  03/11/22 197 lb 12.8 oz (89.7 kg)   BP Readings from Last 3 Encounters:  07/30/22 (!) 153/97  05/04/22 110/70  03/22/22 (!) 160/80   Pulse Readings from Last 3 Encounters:  07/30/22 87  05/04/22 68  04/16/22 65    Renal function: CrCl cannot be calculated (Patient's most recent lab result is older than the maximum 21 days allowed.).  Past Medical History:  Diagnosis Date   Aortic stenosis    mild to moderate on echo 02/2021 with no AS on echo 02/2022   Bilateral leg cramps 09/01/2015   Breast cancer ([place])    left   Essential hypertension 01/07/2010   Qualifier: Diagnosis of  By: Linna Darner MD, Gwyndolyn Saxon      GERD (gastroesophageal reflux disease)    H/O: hysterectomy    History of breast cancer 2006   History of colonic polyps    HLD (hyperlipidemia)    Mitral regurgitation 08/05/2015   Mild to moderate by echo 02/2022   Personal history of chemotherapy 2006   Left Breast Cancer   Personal history of radiation therapy 2006   Left Breast Cancer   Snoring 03/30/2017    Current Outpatient Medications on File Prior to Visit  Medication Sig Dispense Refill   acetaminophen (TYLENOL) 500 MG tablet Take 500 mg by mouth every 6 (six) hours as needed for mild pain or headache.     albuterol (VENTOLIN HFA) 108 (90 Base) MCG/ACT inhaler Inhale 2 puffs into the lungs every 6 (six) hours as needed for wheezing or shortness of breath. 244 g 1   amLODipine (NORVASC) 2.5 MG tablet Take 1 tablet (2.5 mg total) by mouth daily. 180 tablet 3   aspirin 81 MG tablet Take 1 tablet (81 mg total) by mouth daily. 30 tablet 6   carvedilol (COREG) 12.5 MG tablet Take 1 tablet (12.5 mg total) by mouth 2 (two) times daily. 180 tablet 3   COVID-19 mRNA bivalent vaccine, Moderna, (MODERNA COVID-19 BIVAL BOOSTER) 50 MCG/0.5ML injection Inject into the muscle. 0.5 mL 0   gabapentin (NEURONTIN) 300 MG capsule TAKE 1 CAPSULE AT BEDTIME 270 capsule 1   losartan (COZAAR) 100 MG tablet Take 1 tablet (100 mg total) by mouth daily. 90 tablet 3   meloxicam (MOBIC) 7.5 MG tablet Take 1 tablet (7.5 mg total) by mouth daily. 90 tablet 3   pantoprazole (PROTONIX) 40 MG tablet Take 1 tablet (40 mg total) by mouth daily. 90 tablet 3   rosuvastatin (CRESTOR) 10 MG tablet Take 1 tablet (10 mg total) by mouth daily. 90 tablet 3   solifenacin (VESICARE) 5 MG tablet Take 1 tablet (5 mg total) by mouth daily. 90 tablet 3   spironolactone (ALDACTONE) 25 MG tablet Take 1 tablet (25 mg total) by mouth 2 (two) times daily. 180 tablet 3   tiZANidine (ZANAFLEX) 2 MG tablet TAKE 1 TABLET EVERY 6 HOURS AS NEEDED FOR MUSCLE SPASMS 360 tablet 3    triamcinolone (NASACORT) 55 MCG/ACT AERO nasal inhaler Place 2 sprays into the nose daily. 1 Inhaler 12   vitamin C (ASCORBIC ACID) 500 MG tablet Take 500 mg by mouth daily.     zolpidem (AMBIEN) 10 MG tablet TAKE ONE TABLET  AT BEDTIME AS NEEDED FOR SLEEP 90 tablet 1   No current facility-administered medications on file prior to visit.    No Known Allergies   Assessment/Plan:  1. Hypertension - Patient BP in room 113/74 which is at goal of <130/80. Congratulated on diet changes and med compliance. No medication changes needed at this time.   Patient concerned regarding cancer risk since her friend was diagnosed and she previously had breast cancer. Requests CBC drawn so Dr Jenny Reichmann can see before her visit in January. Order placed.  Will follow up in 3 months.  Continue: Losartan '100mg'$  daily Cavedilol 12.'5mg'$  BID Spironolactone '25mg'$  daily Amlodipine '5mg'$  daily Recheck in 3 months  Karren Cobble, PharmD, [place], [address], [place], [place], [place], [address] Phone: [phone], Fax: [phone]

## 2022-09-02 LAB — CBC WITH DIFFERENTIAL/PLATELET
Basophils Absolute: 0.1 10*3/uL (ref 0.0–0.2)
Basos: 1 %
EOS (ABSOLUTE): 0.2 10*3/uL (ref 0.0–0.4)
Eos: 2 %
Hematocrit: 38 % (ref 34.0–46.6)
Hemoglobin: 11.8 g/dL (ref 11.1–15.9)
Immature Grans (Abs): 0 10*3/uL (ref 0.0–0.1)
Immature Granulocytes: 0 %
Lymphocytes Absolute: 2.8 10*3/uL (ref 0.7–3.1)
Lymphs: 32 %
MCH: 28.4 pg (ref 26.6–33.0)
MCHC: 31.1 g/dL — ABNORMAL LOW (ref 31.5–35.7)
MCV: 92 fL (ref 79–97)
Monocytes Absolute: 0.8 10*3/uL (ref 0.1–0.9)
Monocytes: 9 %
Neutrophils Absolute: 5 10*3/uL (ref 1.4–7.0)
Neutrophils: 56 %
Platelets: 290 10*3/uL (ref 150–450)
RBC: 4.15 x10E6/uL (ref 3.77–5.28)
RDW: 14.1 % (ref 11.7–15.4)
WBC: 8.8 10*3/uL (ref 3.4–10.8)

## 2022-10-08 ENCOUNTER — Other Ambulatory Visit: Payer: Self-pay | Admitting: Obstetrics and Gynecology

## 2022-10-08 DIAGNOSIS — Z1231 Encounter for screening mammogram for malignant neoplasm of breast: Secondary | ICD-10-CM

## 2022-10-12 ENCOUNTER — Ambulatory Visit
Admission: RE | Admit: 2022-10-12 | Discharge: 2022-10-12 | Disposition: A | Discharge status: Home / Self Care | Payer: Medicare (Managed Care) | Source: Ambulatory Visit | Attending: Obstetrics and Gynecology | Admitting: Obstetrics and Gynecology

## 2022-10-12 DIAGNOSIS — Z1231 Encounter for screening mammogram for malignant neoplasm of breast: Secondary | ICD-10-CM

## 2022-10-19 ENCOUNTER — Other Ambulatory Visit: Payer: Self-pay | Admitting: Internal Medicine

## 2022-10-19 DIAGNOSIS — G4700 Insomnia, unspecified: Secondary | ICD-10-CM

## 2022-10-27 ENCOUNTER — Telehealth: Payer: Self-pay | Admitting: Internal Medicine

## 2022-10-27 NOTE — Telephone Encounter (Signed)
That would be fine, but not be necessary if you do not have exposure to young people under 16 or so

## 2022-10-27 NOTE — Telephone Encounter (Signed)
Patient called and wanted to know if Dr Jenny Reichmann would recommend her getting the RSV shot? Please advise. Call back [phone]

## 2022-10-27 NOTE — Telephone Encounter (Signed)
Notified pt w/MD response.../lmb 

## 2022-10-29 ENCOUNTER — Telehealth: Payer: Self-pay | Admitting: Internal Medicine

## 2022-10-29 NOTE — Telephone Encounter (Signed)
Notified pt w/MD response.../lmb 

## 2022-10-29 NOTE — Telephone Encounter (Signed)
Patient called stating that she thinks that she caught the flu from her daughter due to her symptoms of congestion and a coughing. Patient is wanting to know if Dr. Jenny Reichmann could prescribe her some Tamiflu. If he could she would like the prescription to be called into the [facility] pharmacy on [address]. Best callback number for patient is [phone]. Patient stated that she is currently at [facility] and can't receive calls until after 1:00 today.

## 2022-10-29 NOTE — Telephone Encounter (Signed)
Sorry, no, we normally only treat if pt has a positive flu test, or she is sure that her daughter had a positive test

## 2022-10-30 DIAGNOSIS — U071 COVID-19: Secondary | ICD-10-CM | POA: Diagnosis not present

## 2022-11-03 ENCOUNTER — Ambulatory Visit: Payer: Medicare (Managed Care) | Admitting: Internal Medicine

## 2022-11-09 ENCOUNTER — Ambulatory Visit: Payer: Medicare (Managed Care) | Admitting: Internal Medicine

## 2022-11-17 ENCOUNTER — Ambulatory Visit (INDEPENDENT_AMBULATORY_CARE_PROVIDER_SITE_OTHER): Payer: Medicare (Managed Care) | Admitting: Internal Medicine

## 2022-11-17 VITALS — BP 138/100 | HR 79 | Temp 98.2°F | Ht 68.0 in | Wt 187.2 lb

## 2022-11-17 DIAGNOSIS — E559 Vitamin D deficiency, unspecified: Secondary | ICD-10-CM | POA: Diagnosis not present

## 2022-11-17 DIAGNOSIS — E538 Deficiency of other specified B group vitamins: Secondary | ICD-10-CM

## 2022-11-17 DIAGNOSIS — Z0001 Encounter for general adult medical examination with abnormal findings: Secondary | ICD-10-CM | POA: Diagnosis not present

## 2022-11-17 DIAGNOSIS — E7800 Pure hypercholesterolemia, unspecified: Secondary | ICD-10-CM | POA: Diagnosis not present

## 2022-11-17 DIAGNOSIS — F32A Depression, unspecified: Secondary | ICD-10-CM | POA: Diagnosis not present

## 2022-11-17 DIAGNOSIS — R739 Hyperglycemia, unspecified: Secondary | ICD-10-CM

## 2022-11-17 DIAGNOSIS — F419 Anxiety disorder, unspecified: Secondary | ICD-10-CM

## 2022-11-17 DIAGNOSIS — I10 Essential (primary) hypertension: Secondary | ICD-10-CM | POA: Diagnosis not present

## 2022-11-17 LAB — TSH: TSH: 1.39 u[IU]/mL (ref 0.35–5.50)

## 2022-11-17 LAB — BASIC METABOLIC PANEL
BUN: 16 mg/dL (ref 6–23)
CO2: 28 mEq/L (ref 19–32)
Calcium: 9.2 mg/dL (ref 8.4–10.5)
Chloride: 102 mEq/L (ref 96–112)
Creatinine, Ser: 0.91 mg/dL (ref 0.40–1.20)
GFR: 64.33 mL/min (ref >60.00)
Glucose, Bld: 94 mg/dL (ref 70–99)
Potassium: 4 mEq/L (ref 3.5–5.1)
Sodium: 140 mEq/L (ref 135–145)

## 2022-11-17 LAB — LIPID PANEL
Cholesterol: 157 mg/dL (ref 0–200)
HDL: 45.9 mg/dL (ref >39.00)
LDL Cholesterol: 82 mg/dL (ref 0–99)
NonHDL: 111.25
Total CHOL/HDL Ratio: 3
Triglycerides: 144 mg/dL (ref 0.0–149.0)
VLDL: 28.8 mg/dL (ref 0.0–40.0)

## 2022-11-17 LAB — HEMOGLOBIN A1C: Hgb A1c MFr Bld: 6.5 % (ref 4.6–6.5)

## 2022-11-17 LAB — CBC WITH DIFFERENTIAL/PLATELET
Basophils Absolute: 0.1 10*3/uL (ref 0.0–0.1)
Basophils Relative: 0.9 % (ref 0.0–3.0)
Eosinophils Absolute: 0.2 10*3/uL (ref 0.0–0.7)
Eosinophils Relative: 2.4 % (ref 0.0–5.0)
HCT: 35.9 % — ABNORMAL LOW (ref 36.0–46.0)
Hemoglobin: 11.8 g/dL — ABNORMAL LOW (ref 12.0–15.0)
Lymphocytes Relative: 26.4 % (ref 12.0–46.0)
Lymphs Abs: 2.2 10*3/uL (ref 0.7–4.0)
MCHC: 33 g/dL (ref 30.0–36.0)
MCV: 88.2 fl (ref 78.0–100.0)
Monocytes Absolute: 0.8 10*3/uL (ref 0.1–1.0)
Monocytes Relative: 9.5 % (ref 3.0–12.0)
Neutro Abs: 5.2 10*3/uL (ref 1.4–7.7)
Neutrophils Relative %: 60.8 % (ref 43.0–77.0)
Platelets: 352 10*3/uL (ref 150.0–400.0)
RBC: 4.07 Mil/uL (ref 3.87–5.11)
RDW: 15.8 % — ABNORMAL HIGH (ref 11.5–15.5)
WBC: 8.5 10*3/uL (ref 4.0–10.5)

## 2022-11-17 LAB — MICROALBUMIN / CREATININE URINE RATIO
Creatinine,U: 105.6 mg/dL
Microalb Creat Ratio: 0.7 mg/g (ref 0.0–30.0)
Microalb, Ur: <0.7 mg/dL (ref 0.0–1.9)

## 2022-11-17 LAB — HEPATIC FUNCTION PANEL
ALT: 15 U/L (ref 0–35)
AST: 16 U/L (ref 0–37)
Albumin: 4.3 g/dL (ref 3.5–5.2)
Alkaline Phosphatase: 55 U/L (ref 39–117)
Bilirubin, Direct: 0.1 mg/dL (ref 0.0–0.3)
Total Bilirubin: 0.3 mg/dL (ref 0.2–1.2)
Total Protein: 7.5 g/dL (ref 6.0–8.3)

## 2022-11-17 LAB — VITAMIN D 25 HYDROXY (VIT D DEFICIENCY, FRACTURES): VITD: 28.96 ng/mL — ABNORMAL LOW (ref 30.00–100.00)

## 2022-11-17 LAB — URINALYSIS, ROUTINE W REFLEX MICROSCOPIC
Bilirubin Urine: NEGATIVE
Ketones, ur: NEGATIVE
Leukocytes,Ua: NEGATIVE
Nitrite: NEGATIVE
Specific Gravity, Urine: 1.02 (ref 1.000–1.030)
Total Protein, Urine: NEGATIVE
Urine Glucose: NEGATIVE
Urobilinogen, UA: 0.2 (ref 0.0–1.0)
pH: 6 (ref 5.0–8.0)

## 2022-11-17 LAB — VITAMIN B12: Vitamin B-12: >1500 pg/mL — ABNORMAL HIGH (ref 211–911)

## 2022-11-17 NOTE — Progress Notes (Signed)
Patient ID: Laura Poole, female   DOB: 1951/10/03, 72 y.o.   MRN: [id number]         Chief Complaint:: wellness exam and low vit d, htn, hld, low B12, anxiety depression       HPI:  Laura Poole is a 72 y.o. female here for wellness exam; for shingrx, Rsv and tdap at pharmacy, declines covid booster, mammogram is scheduled, o/w up to date                 Also s/p recent covid infection (#2) last wk from Art therapist.  Then several in her family caught from her.  Her experience this time not as severe but still some sob.  Seen at [facility] with 102 temp, tx with paxlovid and cough med.  Has inhaler prn at home prior.  Pt denies chest pain, increased sob or doe, wheezing, orthopnea, PND, increased LE swelling, palpitations, dizziness or syncope.   Pt denies polydipsia, polyuria, or new focal neuro s/s.    Pt denies fever, wt loss, night sweats, loss of appetite, or other constitutional symptoms  BP has been controlled at home.  Denies worsening depressive symptoms, suicidal ideation, or panic   Wt Readings from Last 3 Encounters:  11/17/22 187 lb 4 oz (84.9 kg)  07/30/22 189 lb 12.8 oz (86.1 kg)  03/22/22 191 lb 9.6 oz (86.9 kg)   BP Readings from Last 3 Encounters:  11/17/22 (!) 138/100  09/01/22 113/74  07/30/22 (!) 153/97   Immunization History  Administered Date(s) Administered   Fluad Quad(high Dose 65+) 06/15/2019, 08/04/2020, 07/27/2021, 07/06/2022   Influenza Split 08/09/2012   Influenza, High Dose Seasonal PF 07/29/2017, 06/27/2018   Influenza-Unspecified 07/11/2013   Moderna Covid-19 Vaccine Bivalent Booster 25yr & up 12/25/2021   Moderna SARS-COV2 Booster Vaccination 08/07/2020, 03/19/2021, 04/20/2021   Moderna Sars-Covid-2 Vaccination 10/26/2019, 11/23/2019   Pneumococcal Conjugate-13 06/27/2018   Pneumococcal Polysaccharide-23 01/09/2020   Tetanus 12/04/2012   Zoster, Live 05/20/2016   Health Maintenance Due  Topic Date Due   Zoster Vaccines- Shingrix (1 of 2) Never done    DTaP/Tdap/Td (1 - Tdap) 12/05/2012   COVID-19 Vaccine (7 - 2023-24 season) 06/11/2022      Past Medical History:  Diagnosis Date   Aortic stenosis    mild to moderate on echo 02/2021 with no AS on echo 02/2022   Bilateral leg cramps 09/01/2015   Breast cancer ([place]    left   Essential hypertension 01/07/2010   Qualifier: Diagnosis of  By: HLinna DarnerMD, WGwyndolyn Saxon    GERD (gastroesophageal reflux disease)    H/O: hysterectomy    History of breast cancer 2006   History of colonic polyps    HLD (hyperlipidemia)    Mitral regurgitation 08/05/2015   Mild to moderate by echo 02/2022   Personal history of chemotherapy 2006   Left Breast Cancer   Personal history of radiation therapy 2006   Left Breast Cancer   Snoring 03/30/2017   Past Surgical History:  Procedure Laterality Date   ABDOMINAL HYSTERECTOMY     BREAST LUMPECTOMY Left 2006   COLONOSCOPY W/ POLYPECTOMY  2004, 2009   Dr MWatt Climes  TUBAL LIGATION      reports that she has never smoked. She has never used smokeless tobacco. She reports current alcohol use of about 2.0 standard drinks of alcohol per week. She reports that she does not use drugs. family history includes Aneurysm in her brother; Asthma in an other family member; Breast cancer in  her paternal grandmother; Coronary artery disease in her mother; Heart attack (age of onset: 69) in her father; Hyperlipidemia in an other family member; Hypertension in her mother; Sudden death in an other family member. No Known Allergies Current Outpatient Medications on File Prior to Visit  Medication Sig Dispense Refill   acetaminophen (TYLENOL) 500 MG tablet Take 500 mg by mouth every 6 (six) hours as needed for mild pain or headache.     albuterol (VENTOLIN HFA) 108 (90 Base) MCG/ACT inhaler Inhale 2 puffs into the lungs every 6 (six) hours as needed for wheezing or shortness of breath. 244 g 1   amLODipine (NORVASC) 2.5 MG tablet Take 1 tablet (2.5 mg total) by mouth daily. 180 tablet 3    aspirin 81 MG tablet Take 1 tablet (81 mg total) by mouth daily. 30 tablet 6   carvedilol (COREG) 12.5 MG tablet Take 1 tablet (12.5 mg total) by mouth 2 (two) times daily. 180 tablet 3   COVID-19 mRNA bivalent vaccine, Moderna, (MODERNA COVID-19 BIVAL BOOSTER) 50 MCG/0.5ML injection Inject into the muscle. 0.5 mL 0   gabapentin (NEURONTIN) 300 MG capsule TAKE 1 CAPSULE AT BEDTIME 270 capsule 1   losartan (COZAAR) 100 MG tablet Take 1 tablet (100 mg total) by mouth daily. 90 tablet 3   meloxicam (MOBIC) 7.5 MG tablet Take 1 tablet (7.5 mg total) by mouth daily. 90 tablet 3   pantoprazole (PROTONIX) 40 MG tablet Take 1 tablet (40 mg total) by mouth daily. 90 tablet 3   rosuvastatin (CRESTOR) 10 MG tablet Take 1 tablet (10 mg total) by mouth daily. 90 tablet 3   solifenacin (VESICARE) 5 MG tablet Take 1 tablet (5 mg total) by mouth daily. 90 tablet 3   spironolactone (ALDACTONE) 25 MG tablet Take 1 tablet (25 mg total) by mouth 2 (two) times daily. 180 tablet 3   tiZANidine (ZANAFLEX) 2 MG tablet TAKE 1 TABLET EVERY 6 HOURS AS NEEDED FOR MUSCLE SPASMS 360 tablet 3   triamcinolone (NASACORT) 55 MCG/ACT AERO nasal inhaler Place 2 sprays into the nose daily. 1 Inhaler 12   vitamin C (ASCORBIC ACID) 500 MG tablet Take 500 mg by mouth daily.     zolpidem (AMBIEN) 10 MG tablet TAKE 1 TABLET AT BEDTIME AS NEEDED FOR SLEEP 90 tablet 1   No current facility-administered medications on file prior to visit.        ROS:  All others reviewed and negative.  Objective        PE:  BP (!) 138/100   Pulse 79   Temp 98.2 F (36.8 C) (Temporal)   Ht 5' 8"$  (1.727 m)   Wt 187 lb 4 oz (84.9 kg)   SpO2 98%   BMI 28.47 kg/m                 Constitutional: Pt appears in NAD               HENT: Head: NCAT.                Right Ear: External ear normal.                 Left Ear: External ear normal.                Eyes: . Pupils are equal, round, and reactive to light. Conjunctivae and EOM are normal                Nose: without d/c or  deformity               Neck: Neck supple. Gross normal ROM               Cardiovascular: Normal rate and regular rhythm.                 Pulmonary/Chest: Effort normal and breath sounds without rales or wheezing.                Abd:  Soft, NT, ND, + BS, no organomegaly               Neurological: Pt is alert. At baseline orientation, motor grossly intact               Skin: Skin is warm. No rashes, no other new lesions, LE edema - none               Psychiatric: Pt behavior is normal without agitation   Micro: none  Cardiac tracings I have personally interpreted today:  none  Pertinent Radiological findings (summarize): none   Lab Results  Component Value Date   WBC 8.5 11/17/2022   HGB 11.8 (L) 11/17/2022   HCT 35.9 (L) 11/17/2022   PLT 352.0 11/17/2022   GLUCOSE 94 11/17/2022   CHOL 157 11/17/2022   TRIG 144.0 11/17/2022   HDL 45.90 11/17/2022   LDLCALC 82 11/17/2022   ALT 15 11/17/2022   AST 16 11/17/2022   NA 140 11/17/2022   K 4.0 11/17/2022   CL 102 11/17/2022   CREATININE 0.91 11/17/2022   BUN 16 11/17/2022   CO2 28 11/17/2022   TSH 1.39 11/17/2022   INR 1.1 (H) 01/25/2012   HGBA1C 6.5 11/17/2022   MICROALBUR <0.7 11/17/2022   Assessment/Plan:  Laura Poole is a 72 y.o. Black or African American [2] female with  has a past medical history of Aortic stenosis, Bilateral leg cramps (09/01/2015), Breast cancer ([place]), Essential hypertension (01/07/2010), GERD (gastroesophageal reflux disease), H/O: hysterectomy, History of breast cancer (2006), History of colonic polyps, HLD (hyperlipidemia), Mitral regurgitation (08/05/2015), Personal history of chemotherapy (2006), Personal history of radiation therapy (2006), and Snoring (03/30/2017).  Encounter for well adult exam with abnormal findings Age and sex appropriate education and counseling updated with regular exercise and diet Referrals for preventative services - mammogram schduled  soon Immunizations addressed - for tdap, shingrix, RSV at pharmacy, declines covid booster Smoking counseling  - none needed Evidence for depression or other mood disorder - stable anxiety depression - declines need for change in tx or counseling Most recent labs reviewed. I have personally reviewed and have noted: 1) the patient's medical and social history 2) The patient's current medications and supplements 3) The patient's height, weight, and BMI have been recorded in the chart   Vitamin D deficiency Last vitamin D Lab Results  Component Value Date   VD25OH 28.96 (L) 11/17/2022   Low, to start oral replacement   Uncontrolled hypertension BP Readings from Last 3 Encounters:  11/17/22 (!) 138/100  09/01/22 113/74  07/30/22 (!) 153/97   Uncontrolled today, pt states controlled at home, pt to continue medical treatment norvasc 2.5 qd, coreg 12.5 bid, losartan 100 qd   Hyperlipidemia Lab Results  Component Value Date   LDLCALC 82 11/17/2022   Uncontrolled,, pt to continue current statin crestor 10 mg qd and lower chol diet, declines other change   B12 deficiency Lab Results  Component Value Date   VITAMINB12 >1500 (H) 11/17/2022   Stable,  cont oral replacement - b12 1000 mcg qd   Anxiety and depression Stable overall, declines need for change in tx or referral counseling  Followup: Return in about 1 year (around 11/18/2023).  Cathlean Cower, MD 11/20/2022 6:19 PM [facility]

## 2022-11-17 NOTE — Patient Instructions (Signed)
Please remember to follow up with your mammogram as you have planned  Please have your Shingrix (shingles) shots done at your local pharmacy, as well as the RSV, and the Tdap tetanus shot as you can  Please continue to follow your BP as you do on a regular basis  Please continue all other medications as before, and refills have been done if requested.  Please have the pharmacy call with any other refills you may need.  Please continue your efforts at being more active, low cholesterol diet, and weight control.  You are otherwise up to date with prevention measures today.  Please keep your appointments with your specialists as you may have planned  Please go to the LAB at the blood drawing area for the tests to be done  You will be contacted by phone if any changes need to be made immediately.  Otherwise, you will receive a letter about your results with an explanation, but please check with MyChart first.  Please remember to sign up for MyChart if you have not done so, as this will be important to you in the future with finding out test results, communicating by private email, and scheduling acute appointments online when needed.  Please make an Appointment to return for your 1 year visit, or sooner if needed

## 2022-11-20 ENCOUNTER — Encounter: Payer: Self-pay | Admitting: Internal Medicine

## 2022-11-20 NOTE — Assessment & Plan Note (Signed)
Stable overall, declines need for change in tx or referral counseling

## 2022-11-20 NOTE — Assessment & Plan Note (Signed)
Last vitamin D Lab Results  Component Value Date   VD25OH 28.96 (L) 11/17/2022   Low, to start oral replacement

## 2022-11-20 NOTE — Assessment & Plan Note (Signed)
BP Readings from Last 3 Encounters:  11/17/22 (!) 138/100  09/01/22 113/74  07/30/22 (!) 153/97   Uncontrolled today, pt states controlled at home, pt to continue medical treatment norvasc 2.5 qd, coreg 12.5 bid, losartan 100 qd

## 2022-11-20 NOTE — Assessment & Plan Note (Signed)
Age and sex appropriate education and counseling updated with regular exercise and diet Referrals for preventative services - mammogram schduled soon Immunizations addressed - for tdap, shingrix, RSV at pharmacy, declines covid booster Smoking counseling  - none needed Evidence for depression or other mood disorder - stable anxiety depression - declines need for change in tx or counseling Most recent labs reviewed. I have personally reviewed and have noted: 1) the patient's medical and social history 2) The patient's current medications and supplements 3) The patient's height, weight, and BMI have been recorded in the chart

## 2022-11-20 NOTE — Assessment & Plan Note (Signed)
Lab Results  Component Value Date   LDLCALC 82 11/17/2022   Uncontrolled,, pt to continue current statin crestor 10 mg qd and lower chol diet, declines other change

## 2022-11-20 NOTE — Assessment & Plan Note (Signed)
Lab Results  Component Value Date   VITAMINB12 >1500 (H) 11/17/2022   Stable, cont oral replacement - b12 1000 mcg qd

## 2022-11-22 ENCOUNTER — Other Ambulatory Visit: Payer: Self-pay | Admitting: Internal Medicine

## 2022-11-22 NOTE — Telephone Encounter (Signed)
Please refill as per office routine med refill policy (all routine meds to be refilled for 3 mo or monthly (per pt preference) up to one year from last visit, then month to month grace period for 3 mo, then further med refills will have to be denied) ? ?

## 2022-11-30 ENCOUNTER — Ambulatory Visit
Admission: RE | Admit: 2022-11-30 | Discharge: 2022-11-30 | Disposition: A | Discharge status: Home / Self Care | Payer: Medicare (Managed Care) | Source: Ambulatory Visit | Attending: Obstetrics and Gynecology | Admitting: Obstetrics and Gynecology

## 2022-11-30 DIAGNOSIS — Z1231 Encounter for screening mammogram for malignant neoplasm of breast: Secondary | ICD-10-CM | POA: Diagnosis not present

## 2022-12-02 ENCOUNTER — Ambulatory Visit: Payer: Medicare (Managed Care) | Attending: Cardiology | Admitting: Student

## 2022-12-02 VITALS — BP 149/91 | HR 80

## 2022-12-02 DIAGNOSIS — I10 Essential (primary) hypertension: Secondary | ICD-10-CM | POA: Diagnosis not present

## 2022-12-02 NOTE — Progress Notes (Signed)
Patient ID: Laura Poole                 DOB: 24-Jan-1951                      MRN: [id number]      HPI: Laura Poole is a 72 y.o. female referred by Dr. Radford Pax to HTN clinic. PMH is significant for breast cancer, atypical chest pain, exertional dyspnea and GERD.   Patient presents today for BP follow up. She has been following low salt diet and eating healthy home cooked food. She has 63 months old grandson who she looks after half day everyday. Reports her work place can be stressful some days. States she gets nervous at the office visits thinking about her BP. She does not check her BP at home. She has arm cuff which is not validated  Current HTN meds:  Losartan 100 mg daily Spironolactone 25 mg daily Carvedilol 12.5 mg TWICE DAILY Amlodipine 5 mg daily  BP goal: <130/80  Exercise: walking 15 min every day  Diet: low salt diet    Social History   Socioeconomic History   Marital status: Single    Spouse name: Not on file   Number of children: Not on file   Years of education: 12   Highest education level: High school graduate  Occupational History   Not on file  Tobacco Use   Smoking status: Never   Smokeless tobacco: Never  Vaping Use   Vaping Use: Never used  Substance and Sexual Activity   Alcohol use: Yes    Alcohol/week: 2.0 standard drinks of alcohol    Types: 2 Cans of beer per week    Comment: rarely   Drug use: No   Sexual activity: Not on file  Other Topics Concern   Not on file  Social History Narrative   Not on file   Social Determinants of Health   Financial Resource Strain: Low Risk  (03/22/2022)   Overall Financial Resource Strain (CARDIA)    Difficulty of Paying Living Expenses: Not hard at all  Food Insecurity: No Food Insecurity (03/22/2022)   Hunger Vital Sign    Worried About Running Out of Food in the Last Year: Never true    Ran Out of Food in the Last Year: Never true  Transportation Needs: No Transportation Needs (03/22/2022)    PRAPARE - Hydrologist (Medical): No    Lack of Transportation (Non-Medical): No  Physical Activity: Inactive (03/22/2022)   Exercise Vital Sign    Days of Exercise per Week: 0 days    Minutes of Exercise per Session: 0 min  Stress: No Stress Concern Present (03/22/2022)   [place]    Feeling of Stress : Not at all  Social Connections: Moderately Integrated (03/22/2022)   Social Connection and Isolation Panel [NHANES]    Frequency of Communication with Friends and Family: More than three times a week    Frequency of Social Gatherings with Friends and Family: More than three times a week    Attends Religious Services: More than 4 times per year    Active Member of [employer] or Organizations: No    Attends Music therapist: More than 4 times per year    Marital Status: Never married  Intimate Partner Violence: Not At Risk (03/22/2022)   Humiliation, Afraid, Rape, and Kick questionnaire    Fear of Current or  Ex-Partner: No    Emotionally Abused: No    Physically Abused: No    Sexually Abused: No     Wt Readings from Last 3 Encounters:  11/17/22 187 lb 4 oz (84.9 kg)  07/30/22 189 lb 12.8 oz (86.1 kg)  03/22/22 191 lb 9.6 oz (86.9 kg)   BP Readings from Last 3 Encounters:  11/17/22 (!) 138/100  09/01/22 113/74  07/30/22 (!) 153/97   Pulse Readings from Last 3 Encounters:  11/17/22 79  09/01/22 75  07/30/22 87    Renal function: CrCl cannot be calculated (Unknown ideal weight.).  Past Medical History:  Diagnosis Date   Aortic stenosis    mild to moderate on echo 02/2021 with no AS on echo 02/2022   Bilateral leg cramps 09/01/2015   Breast cancer ([place])    left   Essential hypertension 01/07/2010   Qualifier: Diagnosis of  By: Linna Darner MD, Gwyndolyn Saxon     GERD (gastroesophageal reflux disease)    H/O: hysterectomy    History of breast cancer 2006   History of colonic polyps     HLD (hyperlipidemia)    Mitral regurgitation 08/05/2015   Mild to moderate by echo 02/2022   Personal history of chemotherapy 2006   Left Breast Cancer   Personal history of radiation therapy 2006   Left Breast Cancer   Snoring 03/30/2017    Current Outpatient Medications on File Prior to Visit  Medication Sig Dispense Refill   acetaminophen (TYLENOL) 500 MG tablet Take 500 mg by mouth every 6 (six) hours as needed for mild pain or headache.     albuterol (VENTOLIN HFA) 108 (90 Base) MCG/ACT inhaler Inhale 2 puffs into the lungs every 6 (six) hours as needed for wheezing or shortness of breath. 244 g 1   amLODipine (NORVASC) 2.5 MG tablet Take 1 tablet (2.5 mg total) by mouth daily. 180 tablet 3   aspirin 81 MG tablet Take 1 tablet (81 mg total) by mouth daily. 30 tablet 6   carvedilol (COREG) 12.5 MG tablet Take 1 tablet (12.5 mg total) by mouth 2 (two) times daily. 180 tablet 3   COVID-19 mRNA bivalent vaccine, Moderna, (MODERNA COVID-19 BIVAL BOOSTER) 50 MCG/0.5ML injection Inject into the muscle. 0.5 mL 0   gabapentin (NEURONTIN) 300 MG capsule TAKE 1 CAPSULE AT BEDTIME 270 capsule 1   losartan (COZAAR) 100 MG tablet Take 1 tablet (100 mg total) by mouth daily. 90 tablet 3   meloxicam (MOBIC) 7.5 MG tablet Take 1 tablet (7.5 mg total) by mouth daily. 90 tablet 3   pantoprazole (PROTONIX) 40 MG tablet TAKE 1 TABLET DAILY 90 tablet 3   rosuvastatin (CRESTOR) 10 MG tablet Take 1 tablet (10 mg total) by mouth daily. 90 tablet 3   solifenacin (VESICARE) 5 MG tablet Take 1 tablet (5 mg total) by mouth daily. 90 tablet 3   spironolactone (ALDACTONE) 25 MG tablet Take 1 tablet (25 mg total) by mouth 2 (two) times daily. 180 tablet 3   tiZANidine (ZANAFLEX) 2 MG tablet TAKE 1 TABLET EVERY 6 HOURS AS NEEDED FOR MUSCLE SPASMS 360 tablet 3   triamcinolone (NASACORT) 55 MCG/ACT AERO nasal inhaler Place 2 sprays into the nose daily. 1 Inhaler 12   vitamin C (ASCORBIC ACID) 500 MG tablet Take 500  mg by mouth daily.     zolpidem (AMBIEN) 10 MG tablet TAKE 1 TABLET AT BEDTIME AS NEEDED FOR SLEEP 90 tablet 1   No current facility-administered medications on file prior to  visit.    No Known Allergies   Assessment/Plan:  1. Hypertension -  Assessment/Plan: BP uncontrolled ( goal <130/80) in office BP was 160/90 which came down to 149/91 upon repeat measurement Reports she get stressed at [place] and she had stressful night at work today ( works 3rd shift)  Takes all BP medications regularly and tolerates them well without side effects  Follows healthy low salt diet and exercise (15 min walks) everyday; motivated to increase walking min to 20-25 min In setting of white coat syndrome and not having home BP reading will not make any changes to her medications  Continue taking current medications  Losartan 100 mg daily Cavedilol 12.5 mg TWICE DAILY Spironolactone 25 mg daily Amlodipine 5 mg daily Patient to check BP at home at least once a day and bring readings at the next OV Patient to bring home arm cuff for validation at the next OV In future if BP remain elevated may optimize carvedilol dose to 25 mg twice daily or switch losartan to long acting ARB (olmesartan, irbesartan)  Follow up with PharmD scheduled in 4 weeks    Cammy Copa, Pharm.D [facility] A Division of [facility] [place] [address], [place], [place] [address]  Phone: [phone]; Fax: [phone]

## 2022-12-02 NOTE — Assessment & Plan Note (Signed)
BP uncontrolled ( goal <130/80) in office BP was 160/90 which came down to 149/91 upon repeat measurement Reports she get stressed at [place] and she had stressful night at work today ( works 3rd shift)  Takes all BP medications regularly and tolerates them well without side effects  Follows healthy low salt diet and exercise (15 min walks) everyday; motivated to increase walking min to 20-25 min In setting of white coat syndrome and not having home BP reading will not make any changes to her medications  Continue taking current medications  Losartan 160m daily Cavedilol 12.540mBID Spironolactone 2519maily Amlodipine 5mg36mily Patient to check BP at home at least once a day and bring readings at the next OV Patient to bring home arm cuff for validation at the next OV In future if BP remain elevated may optimize carvedilol dose to 25 mg twice daily or switch losartan to long acting ARB (olmesartan, irbesartan)  Follow up with PharmD scheduled in 4 weeks

## 2022-12-02 NOTE — Patient Instructions (Addendum)
No changes made by your pharmacist Cammy Copa, PharmD at today's visit:     Bring all of your meds, your BP cuff and your record of home blood pressures to your next appointment.    HOW TO TAKE YOUR BLOOD PRESSURE AT HOME  Rest 5 minutes before taking your blood pressure.  Don't smoke or drink caffeinated beverages for at least 30 minutes before. Take your blood pressure before (not after) you eat. Sit comfortably with your back supported and both feet on the floor (don't cross your legs). Elevate your arm to heart level on a table or a desk. Use the proper sized cuff. It should fit smoothly and snugly around your bare upper arm. There should be enough room to slip a fingertip under the cuff. The bottom edge of the cuff should be 1 inch above the crease of the elbow. Ideally, take 3 measurements at one sitting and record the average.  Important lifestyle changes to control high blood pressure  Intervention  Effect on the BP  Lose extra pounds and watch your waistline Weight loss is one of the most effective lifestyle changes for controlling blood pressure. If you're overweight or obese, losing even a small amount of weight can help reduce blood pressure. Blood pressure might go down by about 1 millimeter of mercury (mm Hg) with each kilogram (about 2.2 pounds) of weight lost.  Exercise regularly As a general goal, aim for at least 30 minutes of moderate physical activity every day. Regular physical activity can lower high blood pressure by about 5 to 8 mm Hg.  Eat a healthy diet Eating a diet rich in whole grains, fruits, vegetables, and low-fat dairy products and low in saturated fat and cholesterol. A healthy diet can lower high blood pressure by up to 11 mm Hg.  Reduce salt (sodium) in your diet Even a small reduction of sodium in the diet can improve heart health and reduce high blood pressure by about 5 to 6 mm Hg.  Limit alcohol One drink equals 12 ounces of beer, 5 ounces of  wine, or 1.5 ounces of 80-proof liquor.  Limiting alcohol to less than one drink a day for women or two drinks a day for men can help lower blood pressure by about 4 mm Hg.   If you have any questions or concerns please use My Chart to send questions or call the office at [phone] DASH Eating Plan DASH stands for Dietary Approaches to Stop Hypertension. The DASH eating plan is a healthy eating plan that has been shown to: Reduce high blood pressure (hypertension). Reduce your risk for type 2 diabetes, heart disease, and stroke. Help with weight loss. What are tips for following this plan? Reading food labels Check food labels for the amount of salt (sodium) per serving. Choose foods with less than 5 percent of the Daily Value of sodium. Generally, foods with less than 300 milligrams (mg) of sodium per serving fit into this eating plan. To find whole grains, look for the word "whole" as the first word in the ingredient list. Shopping Buy products labeled as "low-sodium" or "no salt added." Buy fresh foods. Avoid canned foods and pre-made or frozen meals. Cooking Avoid adding salt when cooking. Use salt-free seasonings or herbs instead of table salt or sea salt. Check with your health care provider or pharmacist before using salt substitutes. Do not fry foods. Cook foods using healthy methods such as baking, boiling, grilling, roasting, and broiling instead. Cook with heart-healthy oils,  such as olive, canola, avocado, soybean, or sunflower oil. Meal planning  Eat a balanced diet that includes: 4 or more servings of fruits and 4 or more servings of vegetables each day. Try to fill one-half of your plate with fruits and vegetables. 6-8 servings of whole grains each day. Less than 6 oz (170 g) of lean meat, poultry, or fish each day. A 3-oz (85-g) serving of meat is about the same size as a deck of cards. One egg equals 1 oz (28 g). 2-3 servings of low-fat dairy each day. One serving is 1  cup (237 mL). 1 serving of nuts, seeds, or beans 5 times each week. 2-3 servings of heart-healthy fats. Healthy fats called omega-3 fatty acids are found in foods such as walnuts, flaxseeds, fortified milks, and eggs. These fats are also found in cold-water fish, such as sardines, salmon, and mackerel. Limit how much you eat of: Canned or prepackaged foods. Food that is high in trans fat, such as some fried foods. Food that is high in saturated fat, such as fatty meat. Desserts and other sweets, sugary drinks, and other foods with added sugar. Full-fat dairy products. Do not salt foods before eating. Do not eat more than 4 egg yolks a week. Try to eat at least 2 vegetarian meals a week. Eat more home-cooked food and less restaurant, buffet, and fast food. Lifestyle When eating at a restaurant, ask that your food be prepared with less salt or no salt, if possible. If you drink alcohol: Limit how much you use to: 0-1 drink a day for women who are not pregnant. 0-2 drinks a day for men. Be aware of how much alcohol is in your drink. In the [place] one drink equals one 12 oz bottle of beer (355 mL), one 5 oz glass of wine (148 mL), or one 1 oz glass of hard liquor (44 mL). General information Avoid eating more than 2,300 mg of salt a day. If you have hypertension, you may need to reduce your sodium intake to 1,500 mg a day. Work with your health care provider to maintain a healthy body weight or to lose weight. Ask what an ideal weight is for you. Get at least 30 minutes of exercise that causes your heart to beat faster (aerobic exercise) most days of the week. Activities may include walking, swimming, or biking. Work with your health care provider or dietitian to adjust your eating plan to your individual calorie needs. What foods should I eat? Fruits All fresh, dried, or frozen fruit. Canned fruit in natural juice (without added sugar). Vegetables Fresh or frozen vegetables (raw, steamed,  roasted, or grilled). Low-sodium or reduced-sodium tomato and vegetable juice. Low-sodium or reduced-sodium tomato sauce and tomato paste. Low-sodium or reduced-sodium canned vegetables. Grains Whole-grain or whole-wheat bread. Whole-grain or whole-wheat pasta. Brown rice. [place]. Bulgur. Whole-grain and low-sodium cereals. Pita bread. Low-fat, low-sodium crackers. Whole-wheat flour tortillas. Meats and other proteins Skinless chicken or [place]. Ground chicken or [place]. Pork with fat trimmed off. Fish and seafood. Egg whites. Dried beans, peas, or lentils. Unsalted nuts, nut butters, and seeds. Unsalted canned beans. Lean cuts of beef with fat trimmed off. Low-sodium, lean precooked or cured meat, such as sausages or meat loaves. Dairy Low-fat (1%) or fat-free (skim) milk. Reduced-fat, low-fat, or fat-free cheeses. Nonfat, low-sodium ricotta or cottage cheese. Low-fat or nonfat yogurt. Low-fat, low-sodium cheese. Fats and oils Soft margarine without trans fats. Vegetable oil. Reduced-fat, low-fat, or light mayonnaise and salad dressings (reduced-sodium).  Canola, safflower, olive, avocado, soybean, and sunflower oils. Avocado. Seasonings and condiments Herbs. Spices. Seasoning mixes without salt. Other foods Unsalted popcorn and pretzels. Fat-free sweets. The items listed above may not be a complete list of foods and beverages you can eat. Contact a dietitian for more information. What foods should I avoid? Fruits Canned fruit in a light or heavy syrup. Fried fruit. Fruit in cream or butter sauce. Vegetables Creamed or fried vegetables. Vegetables in a cheese sauce. Regular canned vegetables (not low-sodium or reduced-sodium). Regular canned tomato sauce and paste (not low-sodium or reduced-sodium). Regular tomato and vegetable juice (not low-sodium or reduced-sodium). Angie Fava. Olives. Grains Baked goods made with fat, such as croissants, muffins, or some breads. Dry pasta or rice meal  packs. Meats and other proteins Fatty cuts of meat. Ribs. Fried meat. Berniece Salines. Bologna, salami, and other precooked or cured meats, such as sausages or meat loaves. Fat from the back of a pig (fatback). Bratwurst. Salted nuts and seeds. Canned beans with added salt. Canned or smoked fish. Whole eggs or egg yolks. Chicken or [place] with skin. Dairy Whole or 2% milk, cream, and half-and-half. Whole or full-fat cream cheese. Whole-fat or sweetened yogurt. Full-fat cheese. Nondairy creamers. Whipped toppings. Processed cheese and cheese spreads. Fats and oils Butter. Stick margarine. Lard. Shortening. Ghee. Bacon fat. Tropical oils, such as coconut, palm kernel, or palm oil. Seasonings and condiments Onion salt, garlic salt, seasoned salt, table salt, and sea salt. Worcestershire sauce. Tartar sauce. Barbecue sauce. Teriyaki sauce. Soy sauce, including reduced-sodium. Steak sauce. Canned and packaged gravies. Fish sauce. Oyster sauce. Cocktail sauce. Store-bought horseradish. Ketchup. Mustard. Meat flavorings and tenderizers. Bouillon cubes. Hot sauces. Pre-made or packaged marinades. Pre-made or packaged taco seasonings. Relishes. Regular salad dressings. Other foods Salted popcorn and pretzels. The items listed above may not be a complete list of foods and beverages you should avoid. Contact a dietitian for more information. Where to find more information National Heart, Lung, and Blood Institute: https://wilson-eaton.com/ American Heart Association: www.heart.org Academy of Nutrition and Dietetics: www.eatright.[place]: www.kidney.org Summary The DASH eating plan is a healthy eating plan that has been shown to reduce high blood pressure (hypertension). It may also reduce your risk for type 2 diabetes, heart disease, and stroke. When on the DASH eating plan, aim to eat more fresh fruits and vegetables, whole grains, lean proteins, low-fat dairy, and heart-healthy fats. With the DASH  eating plan, you should limit salt (sodium) intake to 2,300 mg a day. If you have hypertension, you may need to reduce your sodium intake to 1,500 mg a day. Work with your health care provider or dietitian to adjust your eating plan to your individual calorie needs. This information is not intended to replace advice given to you by your health care provider. Make sure you discuss any questions you have with your health care provider. Document Revised: 08/31/2019 Document Reviewed: 08/31/2019 Elsevier Patient Education  [place].

## 2022-12-27 ENCOUNTER — Other Ambulatory Visit: Payer: Self-pay | Admitting: Internal Medicine

## 2022-12-28 ENCOUNTER — Ambulatory Visit: Payer: Medicare (Managed Care) | Admitting: Internal Medicine

## 2022-12-31 ENCOUNTER — Ambulatory Visit: Payer: Medicare (Managed Care) | Attending: Cardiology | Admitting: Student

## 2022-12-31 NOTE — Progress Notes (Deleted)
Patient ID: Laura Poole                 DOB: 24-Jan-1951                      MRN: [id number]      HPI: Laura Poole is a 72 y.o. female referred by Dr. Radford Pax to HTN clinic. PMH is significant for breast cancer, atypical chest pain, exertional dyspnea and GERD.   Patient presents today for BP follow up. She has been following low salt diet and eating healthy home cooked food. She has 63 months old grandson who she looks after half day everyday. Reports her work place can be stressful some days. States she gets nervous at the office visits thinking about her BP. She does not check her BP at home. She has arm cuff which is not validated  Current HTN meds:  Losartan 100 mg daily Spironolactone 25 mg daily Carvedilol 12.5 mg TWICE DAILY Amlodipine 5 mg daily  BP goal: <130/80  Exercise: walking 15 min every day  Diet: low salt diet    Social History   Socioeconomic History   Marital status: Single    Spouse name: Not on file   Number of children: Not on file   Years of education: 12   Highest education level: High school graduate  Occupational History   Not on file  Tobacco Use   Smoking status: Never   Smokeless tobacco: Never  Vaping Use   Vaping Use: Never used  Substance and Sexual Activity   Alcohol use: Yes    Alcohol/week: 2.0 standard drinks of alcohol    Types: 2 Cans of beer per week    Comment: rarely   Drug use: No   Sexual activity: Not on file  Other Topics Concern   Not on file  Social History Narrative   Not on file   Social Determinants of Health   Financial Resource Strain: Low Risk  (03/22/2022)   Overall Financial Resource Strain (CARDIA)    Difficulty of Paying Living Expenses: Not hard at all  Food Insecurity: No Food Insecurity (03/22/2022)   Hunger Vital Sign    Worried About Running Out of Food in the Last Year: Never true    Ran Out of Food in the Last Year: Never true  Transportation Needs: No Transportation Needs (03/22/2022)    PRAPARE - Hydrologist (Medical): No    Lack of Transportation (Non-Medical): No  Physical Activity: Inactive (03/22/2022)   Exercise Vital Sign    Days of Exercise per Week: 0 days    Minutes of Exercise per Session: 0 min  Stress: No Stress Concern Present (03/22/2022)   [place]    Feeling of Stress : Not at all  Social Connections: Moderately Integrated (03/22/2022)   Social Connection and Isolation Panel [NHANES]    Frequency of Communication with Friends and Family: More than three times a week    Frequency of Social Gatherings with Friends and Family: More than three times a week    Attends Religious Services: More than 4 times per year    Active Member of [employer] or Organizations: No    Attends Music therapist: More than 4 times per year    Marital Status: Never married  Intimate Partner Violence: Not At Risk (03/22/2022)   Humiliation, Afraid, Rape, and Kick questionnaire    Fear of Current or  Ex-Partner: No    Emotionally Abused: No    Physically Abused: No    Sexually Abused: No     Wt Readings from Last 3 Encounters:  11/17/22 187 lb 4 oz (84.9 kg)  07/30/22 189 lb 12.8 oz (86.1 kg)  03/22/22 191 lb 9.6 oz (86.9 kg)   BP Readings from Last 3 Encounters:  12/02/22 (!) 149/91  11/17/22 (!) 138/100  09/01/22 113/74   Pulse Readings from Last 3 Encounters:  12/02/22 80  11/17/22 79  09/01/22 75    Renal function: CrCl cannot be calculated (Patient's most recent lab result is older than the maximum 21 days allowed.).  Past Medical History:  Diagnosis Date   Aortic stenosis    mild to moderate on echo 02/2021 with no AS on echo 02/2022   Bilateral leg cramps 09/01/2015   Breast cancer ([place])    left   Essential hypertension 01/07/2010   Qualifier: Diagnosis of  By: Linna Darner MD, Gwyndolyn Saxon     GERD (gastroesophageal reflux disease)    H/O: hysterectomy     History of breast cancer 2006   History of colonic polyps    HLD (hyperlipidemia)    Mitral regurgitation 08/05/2015   Mild to moderate by echo 02/2022   Personal history of chemotherapy 2006   Left Breast Cancer   Personal history of radiation therapy 2006   Left Breast Cancer   Snoring 03/30/2017    Current Outpatient Medications on File Prior to Visit  Medication Sig Dispense Refill   acetaminophen (TYLENOL) 500 MG tablet Take 500 mg by mouth every 6 (six) hours as needed for mild pain or headache.     albuterol (VENTOLIN HFA) 108 (90 Base) MCG/ACT inhaler Inhale 2 puffs into the lungs every 6 (six) hours as needed for wheezing or shortness of breath. 244 g 1   amLODipine (NORVASC) 2.5 MG tablet Take 1 tablet (2.5 mg total) by mouth daily. 180 tablet 3   aspirin 81 MG tablet Take 1 tablet (81 mg total) by mouth daily. 30 tablet 6   carvedilol (COREG) 12.5 MG tablet Take 1 tablet (12.5 mg total) by mouth 2 (two) times daily. 180 tablet 3   COVID-19 mRNA bivalent vaccine, Moderna, (MODERNA COVID-19 BIVAL BOOSTER) 50 MCG/0.5ML injection Inject into the muscle. 0.5 mL 0   gabapentin (NEURONTIN) 300 MG capsule TAKE 1 CAPSULE AT BEDTIME 270 capsule 1   losartan (COZAAR) 100 MG tablet Take 1 tablet (100 mg total) by mouth daily. 90 tablet 3   meloxicam (MOBIC) 7.5 MG tablet Take 1 tablet (7.5 mg total) by mouth daily. 90 tablet 3   pantoprazole (PROTONIX) 40 MG tablet TAKE 1 TABLET DAILY 90 tablet 3   rosuvastatin (CRESTOR) 10 MG tablet Take 1 tablet (10 mg total) by mouth daily. 90 tablet 3   solifenacin (VESICARE) 5 MG tablet TAKE 1 TABLET DAILY 90 tablet 1   spironolactone (ALDACTONE) 25 MG tablet Take 1 tablet (25 mg total) by mouth 2 (two) times daily. 180 tablet 3   tiZANidine (ZANAFLEX) 2 MG tablet TAKE 1 TABLET EVERY 6 HOURS AS NEEDED FOR MUSCLE SPASMS 360 tablet 3   triamcinolone (NASACORT) 55 MCG/ACT AERO nasal inhaler Place 2 sprays into the nose daily. 1 Inhaler 12   vitamin C  (ASCORBIC ACID) 500 MG tablet Take 500 mg by mouth daily.     zolpidem (AMBIEN) 10 MG tablet TAKE 1 TABLET AT BEDTIME AS NEEDED FOR SLEEP 90 tablet 1   No current facility-administered  medications on file prior to visit.    No Known Allergies   Assessment/Plan:  1. Hypertension -  Assessment/Plan: BP uncontrolled ( goal <130/80) in office BP was 160/90 which came down to 149/91 upon repeat measurement Reports she get stressed at [employer] and she had stressful night at work today ( works 3rd shift)  Takes all BP medications regularly and tolerates them well without side effects  Follows healthy low salt diet and exercise (15 min walks) everyday; motivated to increase walking min to 20-25 min In setting of white coat syndrome and not having home BP reading will not make any changes to her medications  Continue taking current medications  Losartan 100 mg daily Cavedilol 12.5 mg TWICE DAILY Spironolactone 25 mg daily Amlodipine 5 mg daily Patient to check BP at home at least once a day and bring readings at the next OV Patient to bring home arm cuff for validation at the next OV In future if BP remain elevated may optimize carvedilol dose to 25 mg twice daily or switch losartan to long acting ARB (olmesartan, irbesartan)  Follow up with PharmD scheduled in 4 weeks    Cammy Copa, Pharm.D [facility] of [facility] [address] [place], [place] [address]  Phone: [phone]; Fax: [phone]

## 2023-01-07 ENCOUNTER — Other Ambulatory Visit: Payer: Self-pay | Admitting: Internal Medicine

## 2023-01-18 ENCOUNTER — Other Ambulatory Visit: Payer: Self-pay | Admitting: Internal Medicine

## 2023-01-18 DIAGNOSIS — E785 Hyperlipidemia, unspecified: Secondary | ICD-10-CM

## 2023-01-18 DIAGNOSIS — I10 Essential (primary) hypertension: Secondary | ICD-10-CM

## 2023-02-14 ENCOUNTER — Telehealth: Payer: Self-pay

## 2023-02-14 ENCOUNTER — Encounter: Payer: Self-pay | Admitting: Cardiology

## 2023-02-14 ENCOUNTER — Ambulatory Visit (HOSPITAL_COMMUNITY): Payer: Medicare (Managed Care) | Attending: Cardiology

## 2023-02-14 DIAGNOSIS — I340 Nonrheumatic mitral (valve) insufficiency: Secondary | ICD-10-CM

## 2023-02-14 DIAGNOSIS — I422 Other hypertrophic cardiomyopathy: Secondary | ICD-10-CM | POA: Insufficient documentation

## 2023-02-14 LAB — ECHOCARDIOGRAM COMPLETE
Area-P 1/2: 4.26 cm2
MV M vel: 5.55 m/s
MV Peak grad: 123.2 mmHg
Radius: 0.65 cm
S' Lateral: 2.1 cm

## 2023-02-14 NOTE — Telephone Encounter (Signed)
Talked with patient and reviewed Dr. Norris Cross notes that EF is normal but thickening of the interventricular septum (no HOCM by cardiac MRI 2018) and moderate leaky mitral valve is noted. Patient verbalizes understanding. Repeat echo in 1 year for mitral regurgitation ordered.

## 2023-02-14 NOTE — Telephone Encounter (Signed)
-----   Message from Loa Socks, LPN sent at 10/16/1094  1:24 PM EDT -----  ----- Message ----- From: Quintella Reichert, MD Sent: 02/14/2023   1:17 PM EDT To: Corwin Levins, MD; [address] Triage  2D echo showed normal heart function EF 60 to 65% with thickening of the interventricular septum (no HOCM by cardiac MRI 2018) and moderate leaky mitral valve.  Repeat echo in 1 year for mitral regurgitation

## 2023-02-16 ENCOUNTER — Other Ambulatory Visit: Payer: Self-pay | Admitting: Cardiology

## 2023-03-15 ENCOUNTER — Ambulatory Visit (INDEPENDENT_AMBULATORY_CARE_PROVIDER_SITE_OTHER): Payer: Medicare (Managed Care)

## 2023-03-15 VITALS — Ht 68.0 in | Wt 187.0 lb

## 2023-03-15 DIAGNOSIS — Z0000 Encounter for general adult medical examination without abnormal findings: Secondary | ICD-10-CM | POA: Diagnosis not present

## 2023-03-15 NOTE — Progress Notes (Signed)
Subjective:   Laura Poole is a 72 y.o. female who presents for Medicare Annual (Subsequent) preventive examination.  Review of Systems    Virtual Visit via Telephone Note  I connected with  Laura Poole on 03/15/23 at 10:45 AM EDT by telephone and verified that I am speaking with the correct person using two identifiers.  Location: Patient: Home Provider: Office Persons participating in the virtual visit: patient/Nurse Health Advisor   I discussed the limitations, risks, security and privacy concerns of performing an evaluation and management service by telephone and the availability of in person appointments. The patient expressed understanding and agreed to proceed.  Interactive audio and video telecommunications were attempted between this nurse and patient, however failed, due to patient having technical difficulties OR patient did not have access to video capability.  We continued and completed visit with audio only.  Some vital signs may be absent or patient reported.   Tillie Rung, LPN  Cardiac Risk Factors include: advanced age (>63men, >56 women)     Objective:    Today's Vitals   03/15/23 1047  Weight: 187 lb (84.8 kg)  Height: 5\' 8"  (1.727 m)   Body mass index is 28.43 kg/m.     03/15/2023   10:58 AM 03/19/2021    3:35 PM 01/28/2012    9:13 AM  Advanced Directives  Does Patient Have a Medical Advance Directive? No No Patient does not have advance directive  Would patient like information on creating a medical advance directive? No - Patient declined    Pre-existing out of facility DNR order (yellow form or pink MOST form)   No    Current Medications (verified) Outpatient Encounter Medications as of 03/15/2023  Medication Sig   acetaminophen (TYLENOL) 500 MG tablet Take 500 mg by mouth every 6 (six) hours as needed for mild pain or headache.   albuterol (VENTOLIN HFA) 108 (90 Base) MCG/ACT inhaler USE 2 INHALATIONS EVERY 6 HOURS AS NEEDED FOR  WHEEZING OR SHORTNESS OF BREATH   amLODipine (NORVASC) 2.5 MG tablet Take 1 tablet (2.5 mg total) by mouth daily.   aspirin 81 MG tablet Take 1 tablet (81 mg total) by mouth daily.   carvedilol (COREG) 12.5 MG tablet Take 1 tablet (12.5 mg total) by mouth 2 (two) times daily.   COVID-19 mRNA bivalent vaccine, Moderna, (MODERNA COVID-19 BIVAL BOOSTER) 50 MCG/0.5ML injection Inject into the muscle.   gabapentin (NEURONTIN) 300 MG capsule TAKE 1 CAPSULE AT BEDTIME   losartan (COZAAR) 100 MG tablet TAKE 1 TABLET DAILY   meloxicam (MOBIC) 7.5 MG tablet TAKE 1 TABLET DAILY   pantoprazole (PROTONIX) 40 MG tablet TAKE 1 TABLET DAILY   rosuvastatin (CRESTOR) 10 MG tablet TAKE 1 TABLET DAILY   solifenacin (VESICARE) 5 MG tablet TAKE 1 TABLET DAILY   spironolactone (ALDACTONE) 25 MG tablet TAKE 1 TABLET TWICE A DAY   tiZANidine (ZANAFLEX) 2 MG tablet TAKE 1 TABLET EVERY 6 HOURS AS NEEDED FOR MUSCLE SPASMS   triamcinolone (NASACORT) 55 MCG/ACT AERO nasal inhaler Place 2 sprays into the nose daily.   vitamin C (ASCORBIC ACID) 500 MG tablet Take 500 mg by mouth daily.   zolpidem (AMBIEN) 10 MG tablet TAKE 1 TABLET AT BEDTIME AS NEEDED FOR SLEEP   No facility-administered encounter medications on file as of 03/15/2023.    Allergies (verified) Patient has no known allergies.   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[facility])    Severe by echo but mild by cardiac MRI in 2018 with no evidence of HOCM   Bilateral leg cramps 09/01/2015   Breast cancer ([facility])    left   Essential hypertension 01/07/2010   Qualifier: Diagnosis of  By: Alwyn Ren MD, Chrissie Noa     GERD (gastroesophageal reflux disease)    H/O: hysterectomy    History of breast cancer 2006   History of colonic polyps    HLD (hyperlipidemia)    Mitral regurgitation 08/05/2015   Moderate by echo 02/2023    Personal history of chemotherapy 2006   Left Breast Cancer   Personal history of radiation therapy 2006   Left Breast Cancer   Snoring 03/30/2017   Past Surgical History:  Procedure Laterality Date   ABDOMINAL HYSTERECTOMY     BREAST LUMPECTOMY Left 2006   COLONOSCOPY W/ POLYPECTOMY  2004, 2009   Dr Ewing Schlein   TUBAL LIGATION     Family History  Problem Relation Age of Onset   Heart attack Father 55   Hypertension Mother    Coronary artery disease Mother    Aneurysm Brother    Asthma Other        nephew   Hyperlipidemia Other        nephew   Sudden death Other        nephew   Breast cancer Paternal Grandmother    Colon cancer Neg Hx    Social History   Socioeconomic History   Marital status: Single    Spouse name: Not on file   Number of children: Not on file   Years of education: 12   Highest education level: High school graduate  Occupational History   Not on file  Tobacco Use   Smoking status: Never   Smokeless tobacco: Never  Vaping Use   Vaping Use: Never used  Substance and Sexual Activity   Alcohol use: Yes    Alcohol/week: 2.0 standard drinks of alcohol    Types: 2 Cans of beer per week    Comment: rarely   Drug use: No   Sexual activity: Not on file  Other Topics Concern   Not on file  Social History Narrative   Not on file   Social Determinants of Health   Financial Resource Strain: Low Risk  (03/15/2023)   Overall Financial Resource Strain (CARDIA)    Difficulty of Paying Living Expenses: Not hard at all  Food Insecurity: No Food Insecurity (03/15/2023)   Hunger Vital Sign    Worried About Running Out of Food in the Last Year: Never true    Ran Out of Food in the Last Year: Never true  Transportation Needs: No Transportation Needs (03/15/2023)   PRAPARE - Administrator, Civil Service (Medical): No    Lack of Transportation (Non-Medical): No  Physical Activity: Insufficiently Active (03/15/2023)   Exercise Vital Sign    Days of Exercise  per Week: 3 days    Minutes of Exercise per Session: 30 min  Stress: No Stress Concern Present (03/15/2023)   [employer] of Occupational Health - Occupational Stress Questionnaire    Feeling of Stress : Not at all  Social Connections: Moderately Integrated (03/15/2023)   Social Connection and Isolation Panel [NHANES]    Frequency of Communication with Friends and Family: More than three times a week    Frequency of Social Gatherings with Friends  and Family: More than three times a week    Attends Religious Services: More than 4 times per year    Active Member of Clubs or Organizations: Yes    Attends Engineer, structural: More than 4 times per year    Marital Status: Never married    Tobacco Counseling Counseling given: Not Answered   Clinical Intake:  Pre-visit preparation completed: No  Pain : No/denies pain How often do you need to have someone help you when you read instructions, pamphlets, or other written materials from your doctor or pharmacy?: 1 - Never  Diabetic?  No  Interpreter Needed?: No  Information entered by :: Theresa Mulligan LPN   Activities of Daily Living    03/15/2023   10:55 AM 03/22/2022    4:53 PM  In your present state of health, do you have any difficulty performing the following activities:  Hearing? 0 0  Vision? 0 0  Difficulty concentrating or making decisions? 0 0  Walking or climbing stairs? 0 0  Dressing or bathing? 0 0  Doing errands, shopping? 0 0  Preparing Food and eating ? N N  Using the Toilet? N N  In the past six months, have you accidently leaked urine? Y N  Comment Followed by medical attention   Do you have problems with loss of bowel control? N N  Managing your Medications? N N  Managing your Finances? N N  Housekeeping or managing your Housekeeping? N N    Patient Care Team: Corwin Levins, MD as PCP - General (Internal Medicine) Quintella Reichert, MD as PCP - Cardiology (Cardiology)  Indicate any recent  Medical Services you may have received from other than [facility] providers in the past year (date may be approximate).     Assessment:   This is a routine wellness examination for [place].  Hearing/Vision screen Hearing Screening - Comments:: Denies hearing difficulties   Vision Screening - Comments:: Wears rx glasses - up to date with routine eye exams with  [facility]  Dietary issues and exercise activities discussed: Exercise limited by: None identified   Goals Addressed               This Visit's Progress     Retire (pt-stated)         Depression Screen    03/15/2023   10:52 AM 11/17/2022    9:31 AM 11/17/2022    9:00 AM 03/22/2022    4:53 PM 03/19/2021    3:56 PM 01/09/2020    9:12 AM 03/16/2019   11:36 AM  PHQ 2/9 Scores  PHQ - 2 Score 0 0 0 0 0 0 0  PHQ- 9 Score   0        Fall Risk    03/15/2023   10:57 AM 11/17/2022    9:31 AM 11/17/2022    9:00 AM 03/22/2022    4:53 PM 03/19/2021    3:59 PM  Fall Risk   Falls in the past year? 0 0 0 0 0  Number falls in past yr: 0 0 0 0 0  Injury with Fall? 0 0 0 0 0  Risk for fall due to : No Fall Risks  No Fall Risks No Fall Risks No Fall Risks  Follow up Falls prevention discussed  Falls evaluation completed Falls evaluation completed Falls evaluation completed    FALL RISK PREVENTION PERTAINING TO THE HOME:  Any stairs in or around the home? Yes  If so, are  there any without handrails? No  Home free of loose throw rugs in walkways, pet beds, electrical cords, etc? Yes  Adequate lighting in your home to reduce risk of falls? Yes   ASSISTIVE DEVICES UTILIZED TO PREVENT FALLS:  Life alert? No  Use of a cane, walker or w/c? No  Grab bars in the bathroom? No  Shower chair or bench in shower? No  Elevated toilet seat or a handicapped toilet? No   TIMED UP AND GO:  Was the test performed? No . Audio Visit   Cognitive Function:        03/15/2023   10:58 AM 03/22/2022    4:54 PM  6CIT Screen  What Year? 0 points 0  points  What month? 0 points 0 points  What time? 0 points 0 points  Count back from 20 0 points 0 points  Months in reverse 0 points 0 points  Repeat phrase 0 points 0 points  Total Score 0 points 0 points    Immunizations Immunization History  Administered Date(s) Administered   Fluad Quad(high Dose 65+) 06/15/2019, 08/04/2020, 07/27/2021, 07/06/2022   Influenza Split 08/09/2012   Influenza, High Dose Seasonal PF 07/29/2017, 06/27/2018   Influenza-Unspecified 07/11/2013   Moderna Covid-19 Vaccine Bivalent Booster 37yrs & up 12/25/2021   Moderna SARS-COV2 Booster Vaccination 08/07/2020, 03/19/2021, 04/20/2021   Moderna Sars-Covid-2 Vaccination 10/26/2019, 11/23/2019   Pneumococcal Conjugate-13 06/27/2018   Pneumococcal Polysaccharide-23 01/09/2020   Tetanus 12/04/2012   Zoster, Live 05/20/2016    TDAP status: Due, Education has been provided regarding the importance of this vaccine. Advised may receive this vaccine at local pharmacy or Health Dept. Aware to provide a copy of the vaccination record if obtained from local pharmacy or Health Dept. Verbalized acceptance and understanding.  Flu Vaccine status: Up to date  Pneumococcal vaccine status: Up to date  Covid-19 vaccine status: Completed vaccines  Qualifies for Shingles Vaccine? Yes   Zostavax completed No   Shingrix Completed?: No.    Education has been provided regarding the importance of this vaccine. Patient has been advised to call insurance company to determine out of pocket expense if they have not yet received this vaccine. Advised may also receive vaccine at local pharmacy or Health Dept. Verbalized acceptance and understanding.  Screening Tests Health Maintenance  Topic Date Due   DTaP/Tdap/Td (1 - Tdap) 12/05/2012   COVID-19 Vaccine (7 - 2023-24 season) 03/31/2023 (Originally 06/11/2022)   Zoster Vaccines- Shingrix (1 of 2) 06/15/2023 (Originally 04/12/2003)   INFLUENZA VACCINE  05/12/2023   Medicare Annual  Wellness (AWV)  03/14/2024   MAMMOGRAM  11/30/2024   Colonoscopy  08/19/2029   Pneumonia Vaccine 92+ Years old  Completed   DEXA SCAN  Completed   Hepatitis C Screening  Completed   HPV VACCINES  Aged Out    Health Maintenance  Health Maintenance Due  Topic Date Due   DTaP/Tdap/Td (1 - Tdap) 12/05/2012    Colorectal cancer screening: Type of screening: Colonoscopy. Completed 08/30/19. Repeat every 10 years  Mammogram status: Completed 11/30/22. Repeat every year  Bone Density status: Completed 11/17/21. Results reflect: Bone density results: OSTEOPOROSIS. Repeat every   years.  Lung Cancer Screening: (Low Dose CT Chest recommended if Age 19-80 years, 30 pack-year currently smoking OR have quit w/in 15years.) does not qualify.     Additional Screening:  Hepatitis C Screening: does qualify; Completed 10/30/18  Vision Screening: Recommended annual ophthalmology exams for early detection of glaucoma and other disorders of the eye. Is the  patient up to date with their annual eye exam?  Yes  Who is the provider or what is the name of the office in which the patient attends annual eye exams? [facility] If pt is not established with a provider, would they like to be referred to a provider to establish care? No .   Dental Screening: Recommended annual dental exams for proper oral hygiene  Community Resource Referral / Chronic Care Management:  CRR required this visit?  No   CCM required this visit?  No      Plan:     I have personally reviewed and noted the following in the patient's chart:   Medical and social history Use of alcohol, tobacco or illicit drugs  Current medications and supplements including opioid prescriptions. Patient is not currently taking opioid prescriptions. Functional ability and status Nutritional status Physical activity Advanced directives List of other physicians Hospitalizations, surgeries, and ER visits in previous 12  months Vitals Screenings to include cognitive, depression, and falls Referrals and appointments  In addition, I have reviewed and discussed with patient certain preventive protocols, quality metrics, and best practice recommendations. A written personalized care plan for preventive services as well as general preventive health recommendations were provided to patient.     Tillie Rung, LPN   10/16/1094   Nurse Notes: None

## 2023-03-15 NOTE — Patient Instructions (Addendum)
Laura Poole , Thank you for taking time to come for your Medicare Wellness Visit. I appreciate your ongoing commitment to your health goals. Please review the following plan we discussed and let me know if I can assist you in the future.   These are the goals we discussed:  Goals       Client will verbalize knowledge of self management of Hypertension as evidences by BP reading of 140/90 or less; or as defined by provider      Patient Stated      My goal is to eat healthy, get plenty of rest and enjoy my seven grandchildren.      Retire (pt-stated)        This is a list of the screening recommended for you and due dates:  Health Maintenance  Topic Date Due   DTaP/Tdap/Td vaccine (1 - Tdap) 12/05/2012   COVID-19 Vaccine (7 - 2023-24 season) 03/31/2023*   Zoster (Shingles) Vaccine (1 of 2) 06/15/2023*   Flu Shot  05/12/2023   Medicare Annual Wellness Visit  03/14/2024   Mammogram  11/30/2024   Colon Cancer Screening  08/19/2029   Pneumonia Vaccine  Completed   DEXA scan (bone density measurement)  Completed   Hepatitis C Screening  Completed   HPV Vaccine  Aged Out  *Topic was postponed. The date shown is not the original due date.    Advanced directives: Advance directive discussed with you today. Even though you declined this today, please call our office should you change your mind, and we can give you the proper paperwork for you to fill out.   Conditions/risks identified: None  Next appointment: Follow up in one year for your annual wellness visit    Preventive Care 65 Years and Older, Female Preventive care refers to lifestyle choices and visits with your health care provider that can promote health and wellness. What does preventive care include? A yearly physical exam. This is also called an annual well check. Dental exams once or twice a year. Routine eye exams. Ask your health care provider how often you should have your eyes checked. Personal lifestyle choices,  including: Daily care of your teeth and gums. Regular physical activity. Eating a healthy diet. Avoiding tobacco and drug use. Limiting alcohol use. Practicing safe sex. Taking low-dose aspirin every day. Taking vitamin and mineral supplements as recommended by your health care provider. What happens during an annual well check? The services and screenings done by your health care provider during your annual well check will depend on your age, overall health, lifestyle risk factors, and family history of disease. Counseling  Your health care provider may ask you questions about your: Alcohol use. Tobacco use. Drug use. Emotional well-being. Home and relationship well-being. Sexual activity. Eating habits. History of falls. Memory and ability to understand (cognition). Work and work Astronomer. Reproductive health. Screening  You may have the following tests or measurements: Height, weight, and BMI. Blood pressure. Lipid and cholesterol levels. These may be checked every 5 years, or more frequently if you are over 4 years old. Skin check. Lung cancer screening. You may have this screening every year starting at age 60 if you have a 30-pack-year history of smoking and currently smoke or have quit within the past 15 years. Fecal occult blood test (FOBT) of the stool. You may have this test every year starting at age 13. Flexible sigmoidoscopy or colonoscopy. You may have a sigmoidoscopy every 5 years or a colonoscopy every 10 years starting  at age 9. Hepatitis C blood test. Hepatitis B blood test. Sexually transmitted disease (STD) testing. Diabetes screening. This is done by checking your blood sugar (glucose) after you have not eaten for a while (fasting). You may have this done every 1-3 years. Bone density scan. This is done to screen for osteoporosis. You may have this done starting at age 53. Mammogram. This may be done every 1-2 years. Talk to your health care provider  about how often you should have regular mammograms. Talk with your health care provider about your test results, treatment options, and if necessary, the need for more tests. Vaccines  Your health care provider may recommend certain vaccines, such as: Influenza vaccine. This is recommended every year. Tetanus, diphtheria, and acellular pertussis (Tdap, Td) vaccine. You may need a Td booster every 10 years. Zoster vaccine. You may need this after age 34. Pneumococcal 13-valent conjugate (PCV13) vaccine. One dose is recommended after age 49. Pneumococcal polysaccharide (PPSV23) vaccine. One dose is recommended after age 22. Talk to your health care provider about which screenings and vaccines you need and how often you need them. This information is not intended to replace advice given to you by your health care provider. Make sure you discuss any questions you have with your health care provider. Document Released: 10/24/2015 Document Revised: 06/16/2016 Document Reviewed: 07/29/2015 Elsevier Interactive Patient Education  2017 [employer].  Fall Prevention in the Home Falls can cause injuries. They can happen to people of all ages. There are many things you can do to make your home safe and to help prevent falls. What can I do on the outside of my home? Regularly fix the edges of walkways and driveways and fix any cracks. Remove anything that might make you trip as you walk through a door, such as a raised step or threshold. Trim any bushes or trees on the path to your home. Use bright outdoor lighting. Clear any walking paths of anything that might make someone trip, such as rocks or tools. Regularly check to see if handrails are loose or broken. Make sure that both sides of any steps have handrails. Any raised decks and porches should have guardrails on the edges. Have any leaves, snow, or ice cleared regularly. Use sand or salt on walking paths during winter. Clean up any spills in  your garage right away. This includes oil or grease spills. What can I do in the bathroom? Use night lights. Install grab bars by the toilet and in the tub and shower. Do not use towel bars as grab bars. Use non-skid mats or decals in the tub or shower. If you need to sit down in the shower, use a plastic, non-slip stool. Keep the floor dry. Clean up any water that spills on the floor as soon as it happens. Remove soap buildup in the tub or shower regularly. Attach bath mats securely with double-sided non-slip rug tape. Do not have throw rugs and other things on the floor that can make you trip. What can I do in the bedroom? Use night lights. Make sure that you have a light by your bed that is easy to reach. Do not use any sheets or blankets that are too big for your bed. They should not hang down onto the floor. Have a firm chair that has side arms. You can use this for support while you get dressed. Do not have throw rugs and other things on the floor that can make you trip. What can  I do in the kitchen? Clean up any spills right away. Avoid walking on wet floors. Keep items that you use a lot in easy-to-reach places. If you need to reach something above you, use a strong step stool that has a grab bar. Keep electrical cords out of the way. Do not use floor polish or wax that makes floors slippery. If you must use wax, use non-skid floor wax. Do not have throw rugs and other things on the floor that can make you trip. What can I do with my stairs? Do not leave any items on the stairs. Make sure that there are handrails on both sides of the stairs and use them. Fix handrails that are broken or loose. Make sure that handrails are as long as the stairways. Check any carpeting to make sure that it is firmly attached to the stairs. Fix any carpet that is loose or worn. Avoid having throw rugs at the top or bottom of the stairs. If you do have throw rugs, attach them to the floor with carpet  tape. Make sure that you have a light switch at the top of the stairs and the bottom of the stairs. If you do not have them, ask someone to add them for you. What else can I do to help prevent falls? Wear shoes that: Do not have high heels. Have rubber bottoms. Are comfortable and fit you well. Are closed at the toe. Do not wear sandals. If you use a stepladder: Make sure that it is fully opened. Do not climb a closed stepladder. Make sure that both sides of the stepladder are locked into place. Ask someone to hold it for you, if possible. Clearly mark and make sure that you can see: Any grab bars or handrails. First and last steps. Where the edge of each step is. Use tools that help you move around (mobility aids) if they are needed. These include: Canes. Walkers. Scooters. Crutches. Turn on the lights when you go into a dark area. Replace any light bulbs as soon as they burn out. Set up your furniture so you have a clear path. Avoid moving your furniture around. If any of your floors are uneven, fix them. If there are any pets around you, be aware of where they are. Review your medicines with your doctor. Some medicines can make you feel dizzy. This can increase your chance of falling. Ask your doctor what other things that you can do to help prevent falls. This information is not intended to replace advice given to you by your health care provider. Make sure you discuss any questions you have with your health care provider. Document Released: 07/24/2009 Document Revised: 03/04/2016 Document Reviewed: 11/01/2014 Elsevier Interactive Patient Education  2017 [employer].

## 2023-04-07 ENCOUNTER — Encounter: Payer: Self-pay | Admitting: Internal Medicine

## 2023-04-07 ENCOUNTER — Ambulatory Visit (INDEPENDENT_AMBULATORY_CARE_PROVIDER_SITE_OTHER): Payer: Medicare (Managed Care) | Admitting: Internal Medicine

## 2023-04-07 VITALS — BP 130/76 | HR 65 | Temp 98.7°F | Ht 68.0 in | Wt 179.0 lb

## 2023-04-07 DIAGNOSIS — I10 Essential (primary) hypertension: Secondary | ICD-10-CM | POA: Diagnosis not present

## 2023-04-07 DIAGNOSIS — E538 Deficiency of other specified B group vitamins: Secondary | ICD-10-CM

## 2023-04-07 DIAGNOSIS — J4530 Mild persistent asthma, uncomplicated: Secondary | ICD-10-CM

## 2023-04-07 DIAGNOSIS — J45909 Unspecified asthma, uncomplicated: Secondary | ICD-10-CM | POA: Insufficient documentation

## 2023-04-07 DIAGNOSIS — E559 Vitamin D deficiency, unspecified: Secondary | ICD-10-CM

## 2023-04-07 DIAGNOSIS — R739 Hyperglycemia, unspecified: Secondary | ICD-10-CM

## 2023-04-07 LAB — LIPID PANEL
Cholesterol: 150 mg/dL (ref 0–200)
HDL: 52.1 mg/dL (ref >39.00)
LDL Cholesterol: 82 mg/dL (ref 0–99)
NonHDL: 98.32
Total CHOL/HDL Ratio: 3
Triglycerides: 84 mg/dL (ref 0.0–149.0)
VLDL: 16.8 mg/dL (ref 0.0–40.0)

## 2023-04-07 LAB — BASIC METABOLIC PANEL
BUN: 20 mg/dL (ref 6–23)
CO2: 31 mEq/L (ref 19–32)
Calcium: 9.7 mg/dL (ref 8.4–10.5)
Chloride: 101 mEq/L (ref 96–112)
Creatinine, Ser: 1.14 mg/dL (ref 0.40–1.20)
GFR: 48.95 mL/min — ABNORMAL LOW (ref >60.00)
Glucose, Bld: 102 mg/dL — ABNORMAL HIGH (ref 70–99)
Potassium: 4.6 mEq/L (ref 3.5–5.1)
Sodium: 139 mEq/L (ref 135–145)

## 2023-04-07 LAB — HEPATIC FUNCTION PANEL
ALT: 16 U/L (ref 0–35)
AST: 18 U/L (ref 0–37)
Albumin: 4.3 g/dL (ref 3.5–5.2)
Alkaline Phosphatase: 59 U/L (ref 39–117)
Bilirubin, Direct: 0.1 mg/dL (ref 0.0–0.3)
Total Bilirubin: 0.3 mg/dL (ref 0.2–1.2)
Total Protein: 7.5 g/dL (ref 6.0–8.3)

## 2023-04-07 LAB — HEMOGLOBIN A1C: Hgb A1c MFr Bld: 6.4 % (ref 4.6–6.5)

## 2023-04-07 MED ORDER — ALBUTEROL SULFATE HFA 108 (90 BASE) MCG/ACT IN AERS: INHALATION_SPRAY | RESPIRATORY_TRACT | 5 refills | Status: AC

## 2023-04-07 MED ORDER — FLUTICASONE-SALMETEROL 250-50 MCG/ACT IN AEPB: 1.0000 | INHALATION_SPRAY | Freq: Two times a day (BID) | RESPIRATORY_TRACT | 3 refills | Status: DC

## 2023-04-07 NOTE — Assessment & Plan Note (Signed)
Last vitamin D Lab Results  Component Value Date   VD25OH 28.96 (L) 11/17/2022   Low, to start oral replacement  

## 2023-04-07 NOTE — Progress Notes (Signed)
Patient ID: Laura Poole, female   DOB: 19-Nov-1950, 72 y.o.   MRN: [id number]        Chief Complaint: follow up asthma, hyperglycemia, low vit d, low b12, htn, hld       HPI:  Laura Poole is a 72 y.o. female here with c/o worsening sob doe wheeziness in the past month with increased use of albuterol several times per wk.  Pt denies chest pain, orthopnea, PND, increased LE swelling, palpitations, dizziness or syncope.   Pt denies polydipsia, polyuria, or new focal neuro s/s.    Pt denies fever, wt loss, night sweats, loss of appetite, or other constitutional symptoms  Did have recent mild elevated glc 120 [place] [facility] [place] and she became concerned.  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 Wt down about 8 lbs from last visit with better diet as she has been able to resist most donuts..  BP improved recently as well at home.  .   Wt Readings from Last 3 Encounters:  04/07/23 179 lb (81.2 kg)  03/15/23 187 lb (84.8 kg)  11/17/22 187 lb 4 oz (84.9 kg)   BP Readings from Last 3 Encounters:  04/07/23 130/76  12/02/22 (!) 149/91  11/17/22 (!) 138/100         Past Medical History:  Diagnosis Date   Aortic stenosis    mild to moderate on echo 02/2021 with no AS on echo 02/2022 and none by echo 02/28/2023   Asymmetric septal hypertrophy (HCC)    Severe by echo but mild by cardiac MRI in 2018 with no evidence of HOCM   Bilateral leg cramps 09/01/2015   Breast cancer (HCC)    left   Essential hypertension 01/07/2010   Qualifier: Diagnosis of  By: Alwyn Ren MD, Chrissie Noa     GERD (gastroesophageal reflux disease)    H/O: hysterectomy    History of breast cancer 2006   History of colonic polyps    HLD (hyperlipidemia)    Mitral regurgitation 08/05/2015   Moderate by echo 02/2023   Personal history of chemotherapy 2006   Left Breast Cancer   Personal history of radiation therapy 2006   Left Breast Cancer   Snoring 03/30/2017   Past Surgical History:   Procedure Laterality Date   ABDOMINAL HYSTERECTOMY     BREAST LUMPECTOMY Left 2006   COLONOSCOPY W/ POLYPECTOMY  2004, 2009   Dr Ewing Schlein   TUBAL LIGATION      reports that she has never smoked. She has never used smokeless tobacco. She reports current alcohol use of about 2.0 standard drinks of alcohol per week. She reports that she does not use drugs. family history includes Aneurysm in her brother; Asthma in an other family member; Breast cancer in her paternal grandmother; Coronary artery disease in her mother; Heart attack (age of onset: 98) in her father; Hyperlipidemia in an other family member; Hypertension in her mother; Sudden death in an other family member. No Known Allergies Current Outpatient Medications on File Prior to Visit  Medication Sig Dispense Refill   acetaminophen (TYLENOL) 500 MG tablet Take 500 mg by mouth every 6 (six) hours as needed for mild pain or headache.     amLODipine (NORVASC) 2.5 MG tablet Take 1 tablet (2.5 mg total) by mouth daily. 180 tablet 3   aspirin 81 MG tablet Take 1 tablet (81 mg total) by mouth daily. 30 tablet 6   carvedilol (COREG) 12.5 MG tablet Take 1 tablet (12.5  mg total) by mouth 2 (two) times daily. 180 tablet 3   COVID-19 mRNA bivalent vaccine, Moderna, (MODERNA COVID-19 BIVAL BOOSTER) 50 MCG/0.5ML injection Inject into the muscle. 0.5 mL 0   gabapentin (NEURONTIN) 300 MG capsule TAKE 1 CAPSULE AT BEDTIME 270 capsule 1   losartan (COZAAR) 100 MG tablet TAKE 1 TABLET DAILY 90 tablet 3   meloxicam (MOBIC) 7.5 MG tablet TAKE 1 TABLET DAILY 90 tablet 3   pantoprazole (PROTONIX) 40 MG tablet TAKE 1 TABLET DAILY 90 tablet 3   rosuvastatin (CRESTOR) 10 MG tablet TAKE 1 TABLET DAILY 90 tablet 3   solifenacin (VESICARE) 5 MG tablet TAKE 1 TABLET DAILY 90 tablet 1   spironolactone (ALDACTONE) 25 MG tablet TAKE 1 TABLET TWICE A DAY 180 tablet 3   tiZANidine (ZANAFLEX) 2 MG tablet TAKE 1 TABLET EVERY 6 HOURS AS NEEDED FOR MUSCLE SPASMS 360 tablet 3    triamcinolone (NASACORT) 55 MCG/ACT AERO nasal inhaler Place 2 sprays into the nose daily. 1 Inhaler 12   vitamin C (ASCORBIC ACID) 500 MG tablet Take 500 mg by mouth daily.     zolpidem (AMBIEN) 10 MG tablet TAKE 1 TABLET AT BEDTIME AS NEEDED FOR SLEEP 90 tablet 1   No current facility-administered medications on file prior to visit.        ROS:  All others reviewed and negative.  Objective        PE:  BP 130/76 (BP Location: Right Arm, Patient Position: Sitting, Cuff Size: Normal)   Pulse 65   Temp 98.7 F (37.1 C) (Oral)   Ht 5\' 8"  (1.727 m)   Wt 179 lb (81.2 kg)   SpO2 98%   BMI 27.22 kg/m                 Constitutional: Pt appears in NAD               HENT: Head: NCAT.                Right Ear: External ear normal.                 Left Ear: External ear normal.                Eyes: . Pupils are equal, round, and reactive to light. Conjunctivae and EOM are normal               Nose: without d/c or deformity               Neck: Neck supple. Gross normal ROM               Cardiovascular: Normal rate and regular rhythm.                 Pulmonary/Chest: Effort normal and breath sounds without rales or wheezing.                Abd:  Soft, NT, ND, + BS, no organomegaly               Neurological: Pt is alert. At baseline orientation, motor grossly intact               Skin: Skin is warm. No rashes, no other new lesions, LE edema - none               Psychiatric: Pt behavior is normal without agitation   Micro: none  Cardiac tracings I have personally interpreted today:  none  Pertinent  Radiological findings (summarize): none   Lab Results  Component Value Date   WBC 8.5 11/17/2022   HGB 11.8 (L) 11/17/2022   HCT 35.9 (L) 11/17/2022   PLT 352.0 11/17/2022   GLUCOSE 102 (H) 04/07/2023   CHOL 150 04/07/2023   TRIG 84.0 04/07/2023   HDL 52.10 04/07/2023   LDLCALC 82 04/07/2023   ALT 16 04/07/2023   AST 18 04/07/2023   NA 139 04/07/2023   K 4.6 04/07/2023   CL 101  04/07/2023   CREATININE 1.14 04/07/2023   BUN 20 04/07/2023   CO2 31 04/07/2023   TSH 1.39 11/17/2022   INR 1.1 (H) 01/25/2012   HGBA1C 6.4 04/07/2023   MICROALBUR <0.7 11/17/2022   Assessment/Plan:  Cordy Goll is a 72 y.o. Black or African American [2] female with  has a past medical history of Aortic stenosis, Asymmetric septal hypertrophy (HCC), Bilateral leg cramps (09/01/2015), Breast cancer (HCC), Essential hypertension (01/07/2010), GERD (gastroesophageal reflux disease), H/O: hysterectomy, History of breast cancer (2006), History of colonic polyps, HLD (hyperlipidemia), Mitral regurgitation (08/05/2015), Personal history of chemotherapy (2006), Personal history of radiation therapy (2006), and Snoring (03/30/2017).  Vitamin D deficiency Last vitamin D Lab Results  Component Value Date   VD25OH 28.96 (L) 11/17/2022   Low, to start oral replacement   Uncontrolled hypertension BP Readings from Last 3 Encounters:  04/07/23 130/76  12/02/22 (!) 149/91  11/17/22 (!) 138/100   Stable, pt to continue medical treatment norvasc 2.5 mg every day, coreg 12.5 bid, losartan 100 every day, aldactone 25 qd   B12 deficiency Lab Results  Component Value Date   VITAMINB12 >1500 (H) 11/17/2022   Stable, cont oral replacement - b12 1000 mcg qd   Asthma Mild uncontrolled, gave handicapped parking application signed per pt request, also continue albuterol inhaler, and add advair 250 50 bid  Hyperglycemia Lab Results  Component Value Date   HGBA1C 6.4 04/07/2023   Stable, pt to continue current medical treatment  - diet, wt control  Followup: Return in about 7 months (around 11/21/2023).  Oliver Barre, MD 04/10/2023 3:20 PM [facility]  Primary Care - [facility]

## 2023-04-07 NOTE — Progress Notes (Signed)
The test results show that your current treatment is OK, as the tests are stable.  Please continue the same plan.  There is no other need for change of treatment or further evaluation based on these results, at this time.  thanks 

## 2023-04-07 NOTE — Patient Instructions (Addendum)
Please take all new medication as prescribed - the advair inhaler  You are given the handicapped parking application signed today  Please continue all other medications as before, and refills have been done if requested.  Please have the pharmacy call with any other refills you may need.  Please continue your efforts at being more active, low cholesterol diet, and weight control.  Please keep your appointments with your specialists as you may have planned  Please go to the LAB at the blood drawing area for the tests to be done  You will be contacted by phone if any changes need to be made immediately.  Otherwise, you will receive a letter about your results with an explanation, but please check with MyChart first.  Please remember to sign up for MyChart if you have not done so, as this will be important to you in the future with finding out test results, communicating by private email, and scheduling acute appointments online when needed.  Please make an Appointment to return in Nov 21 2023, or sooner if needed

## 2023-04-10 ENCOUNTER — Encounter: Payer: Self-pay | Admitting: Internal Medicine

## 2023-04-10 NOTE — Assessment & Plan Note (Signed)
Lab Results  Component Value Date   VITAMINB12 >1500 (H) 11/17/2022   Stable, cont oral replacement - b12 1000 mcg qd  

## 2023-04-10 NOTE — Assessment & Plan Note (Signed)
Lab Results  Component Value Date   HGBA1C 6.4 04/07/2023   Stable, pt to continue current medical treatment  - diet, wt control

## 2023-04-10 NOTE — Assessment & Plan Note (Signed)
Mild uncontrolled, gave handicapped parking application signed per pt request, also continue albuterol inhaler, and add advair 250 50 bid

## 2023-04-10 NOTE — Assessment & Plan Note (Signed)
BP Readings from Last 3 Encounters:  04/07/23 130/76  12/02/22 (!) 149/91  11/17/22 (!) 138/100   Stable, pt to continue medical treatment norvasc 2.5 mg every day, coreg 12.5 bid, losartan 100 every day, aldactone 25 qd

## 2023-04-11 ENCOUNTER — Telehealth: Payer: Self-pay | Admitting: Internal Medicine

## 2023-04-11 DIAGNOSIS — G4700 Insomnia, unspecified: Secondary | ICD-10-CM

## 2023-04-11 MED ORDER — ZOLPIDEM TARTRATE 10 MG PO TABS
ORAL_TABLET | ORAL | 1 refills | Status: DC
Start: 2023-04-11 — End: 2023-10-10

## 2023-04-11 NOTE — Telephone Encounter (Signed)
Ok done erx 

## 2023-04-11 NOTE — Telephone Encounter (Signed)
Prescription Request  04/11/2023  LOV: 04/07/2023  What is the name of the medication or equipment? zolpidem (AMBIEN) 10 MG tablet   Have you contacted your pharmacy to request a refill? No   Which pharmacy would you like this sent to?  [facility] - [place], [place] - [address] [place] [address] Phone: [phone] Fax: [phone]    Patient notified that their request is being sent to the clinical staff for review and that they should receive a response within 2 business days.   Please advise at Mobile [phone] (mobile)

## 2023-04-29 ENCOUNTER — Other Ambulatory Visit: Payer: Self-pay | Admitting: Cardiology

## 2023-05-17 ENCOUNTER — Other Ambulatory Visit: Payer: Self-pay | Admitting: Cardiology

## 2023-06-02 ENCOUNTER — Other Ambulatory Visit: Payer: Self-pay | Admitting: Cardiology

## 2023-06-24 ENCOUNTER — Other Ambulatory Visit: Payer: Self-pay

## 2023-06-24 ENCOUNTER — Other Ambulatory Visit: Payer: Self-pay | Admitting: Internal Medicine

## 2023-06-24 DIAGNOSIS — Z01411 Encounter for gynecological examination (general) (routine) with abnormal findings: Secondary | ICD-10-CM | POA: Diagnosis not present

## 2023-06-24 DIAGNOSIS — Z01419 Encounter for gynecological examination (general) (routine) without abnormal findings: Secondary | ICD-10-CM | POA: Diagnosis not present

## 2023-06-24 DIAGNOSIS — Z124 Encounter for screening for malignant neoplasm of cervix: Secondary | ICD-10-CM | POA: Diagnosis not present

## 2023-06-24 DIAGNOSIS — Z1331 Encounter for screening for depression: Secondary | ICD-10-CM | POA: Diagnosis not present

## 2023-06-29 DIAGNOSIS — Z01411 Encounter for gynecological examination (general) (routine) with abnormal findings: Secondary | ICD-10-CM | POA: Diagnosis not present

## 2023-06-29 DIAGNOSIS — Z13820 Encounter for screening for osteoporosis: Secondary | ICD-10-CM | POA: Diagnosis not present

## 2023-06-30 ENCOUNTER — Telehealth: Payer: Self-pay | Admitting: Internal Medicine

## 2023-06-30 MED ORDER — TIZANIDINE HCL 2 MG PO TABS: ORAL_TABLET | ORAL | 2 refills | Status: DC

## 2023-06-30 NOTE — Telephone Encounter (Signed)
[facility]

## 2023-06-30 NOTE — Telephone Encounter (Signed)
tiZANidine (ZANAFLEX) 2 MG tablet [[id number]]   Pt is requesting a refill on this medication because she is out of refills. Is this request okay or do she need to set up an appointment?

## 2023-07-01 ENCOUNTER — Ambulatory Visit: Payer: Medicare (Managed Care) | Attending: Internal Medicine | Admitting: Student

## 2023-07-01 ENCOUNTER — Encounter: Payer: Self-pay | Admitting: Student

## 2023-07-01 VITALS — BP 120/70 | HR 78

## 2023-07-01 DIAGNOSIS — I10 Essential (primary) hypertension: Secondary | ICD-10-CM | POA: Diagnosis not present

## 2023-07-01 MED ORDER — CARVEDILOL 12.5 MG PO TABS: 12.5000 mg | ORAL_TABLET | Freq: Two times a day (BID) | ORAL | 3 refills | Status: DC

## 2023-07-01 NOTE — Progress Notes (Signed)
Patient ID: Laura Poole                 DOB: [birth year]                      MRN: [id number]      HPI: Laura Poole is a 72 y.o. female referred by Dr. Mayford Knife to HTN clinic. PMH is significant for breast cancer, atypical chest pain, exertional dyspnea and GERD.   Patient was seen last in Feb, 2024, by pharmD cancelled f/u appointment never rescheduled. Out of refill on one of the medication so scheduled appointment with PharmD Patient presented today for BP follow up, reports she does not check her BP at home as her home BP monitor got lost at work. She take her BP medications regularly and tolerates them well. States she gets tense at office visits and her BP runs high at last PCP visit it was 170/80. She has made some significant changes to her diet and lower her salt intake significantly. Avoids canned food, french fries, deli meat. She loves potato chips but only have them once a month. Patient planing to buy new home monitor and will start exercising regularly.  Have stationary bike at home will do biking 30 min per day.    Current HTN meds:  Losartan 100 mg daily  Spironolactone 25 mg daily Carvedilol 12.5 mg TWICE DAILY Amlodipine 2.5 mg daily  BP goal: <130/80  Exercise: exercise bike - does not use  Diet: low salt diet   Alcohol: 1 drink per week  Smoking: quit long time back (almost 50 years ago)   Social History   Socioeconomic History   Marital status: Single    Spouse name: Not on file   Number of children: Not on file   Years of education: 12   Highest education level: High school graduate  Occupational History   Not on file  Tobacco Use   Smoking status: Never   Smokeless tobacco: Never  Vaping Use   Vaping status: Never Used  Substance and Sexual Activity   Alcohol use: Yes    Alcohol/week: 2.0 standard drinks of alcohol    Types: 2 Cans of beer per week    Comment: rarely   Drug use: No   Sexual activity: Not on file  Other Topics Concern   Not  on file  Social History Narrative   Not on file   Social Determinants of Health   Financial Resource Strain: Low Risk  (03/15/2023)   Overall Financial Resource Strain (CARDIA)    Difficulty of Paying Living Expenses: Not hard at all  Food Insecurity: No Food Insecurity (03/15/2023)   Hunger Vital Sign    Worried About Running Out of Food in the Last Year: Never true    Ran Out of Food in the Last Year: Never true  Transportation Needs: No Transportation Needs (03/15/2023)   PRAPARE - Administrator, Civil Service (Medical): No    Lack of Transportation (Non-Medical): No  Physical Activity: Insufficiently Active (03/15/2023)   Exercise Vital Sign    Days of Exercise per Week: 3 days    Minutes of Exercise per Session: 30 min  Stress: No Stress Concern Present (03/15/2023)   [employer] of Occupational Health - Occupational Stress Questionnaire    Feeling of Stress : Not at all  Social Connections: Unknown (06/27/2023)   Received from [facility]   Social Network    Social Network: Not on file  Intimate Partner Violence: Unknown (06/27/2023)   Received from [facility]   HITS    Physically Hurt: Not on file    Insult or Talk Down To: Not on file    Threaten Physical Harm: Not on file    Scream or Curse: Not on file     Wt Readings from Last 3 Encounters:  04/07/23 179 lb (81.2 kg)  03/15/23 187 lb (84.8 kg)  11/17/22 187 lb 4 oz (84.9 kg)   BP Readings from Last 3 Encounters:  04/07/23 130/76  12/02/22 (!) 149/91  11/17/22 (!) 138/100   Pulse Readings from Last 3 Encounters:  04/07/23 65  12/02/22 80  11/17/22 79    Renal function: CrCl cannot be calculated (Patient's most recent lab result is older than the maximum 21 days allowed.).  Past Medical History:  Diagnosis Date   Aortic stenosis    mild to moderate on echo 02/2021 with no AS on echo 02/2022 and none by echo 02/28/2023   Asymmetric septal hypertrophy (HCC)    Severe by echo but mild by  cardiac MRI in 2018 with no evidence of HOCM   Bilateral leg cramps 09/01/2015   Breast cancer (HCC)    left   Essential hypertension 01/07/2010   Qualifier: Diagnosis of  By: Alwyn Ren MD, Chrissie Noa     GERD (gastroesophageal reflux disease)    H/O: hysterectomy    History of breast cancer 2006   History of colonic polyps    HLD (hyperlipidemia)    Mitral regurgitation 08/05/2015   Moderate by echo 02/2023   Personal history of chemotherapy 2006   Left Breast Cancer   Personal history of radiation therapy 2006   Left Breast Cancer   Snoring 03/30/2017    Current Outpatient Medications on File Prior to Visit  Medication Sig Dispense Refill   acetaminophen (TYLENOL) 500 MG tablet Take 500 mg by mouth every 6 (six) hours as needed for mild pain or headache.     albuterol (VENTOLIN HFA) 108 (90 Base) MCG/ACT inhaler USE 2 INHALATIONS EVERY 6 HOURS AS NEEDED FOR WHEEZING OR SHORTNESS OF BREATH 18 g 5   amLODipine (NORVASC) 2.5 MG tablet TAKE 1 TABLET DAILY 180 tablet 3   aspirin 81 MG tablet Take 1 tablet (81 mg total) by mouth daily. 30 tablet 6   carvedilol (COREG) 12.5 MG tablet TAKE 1 TABLET TWICE A DAY 60 tablet 0   COVID-19 mRNA bivalent vaccine, Moderna, (MODERNA COVID-19 BIVAL BOOSTER) 50 MCG/0.5ML injection Inject into the muscle. 0.5 mL 0   fluticasone-salmeterol (ADVAIR DISKUS) 250-50 MCG/ACT AEPB Inhale 1 puff into the lungs in the morning and at bedtime. 3 each 3   gabapentin (NEURONTIN) 300 MG capsule TAKE 1 CAPSULE AT BEDTIME 270 capsule 1   losartan (COZAAR) 100 MG tablet TAKE 1 TABLET DAILY 90 tablet 3   meloxicam (MOBIC) 7.5 MG tablet TAKE 1 TABLET DAILY 90 tablet 3   pantoprazole (PROTONIX) 40 MG tablet TAKE 1 TABLET DAILY 90 tablet 3   rosuvastatin (CRESTOR) 10 MG tablet TAKE 1 TABLET DAILY 90 tablet 3   solifenacin (VESICARE) 5 MG tablet TAKE 1 TABLET DAILY 90 tablet 3   spironolactone (ALDACTONE) 25 MG tablet TAKE 1 TABLET TWICE A DAY 180 tablet 3   tiZANidine  (ZANAFLEX) 2 MG tablet TAKE 1 TABLET EVERY 6 HOURS AS NEEDED FOR MUSCLE SPASMS 360 tablet 2   triamcinolone (NASACORT) 55 MCG/ACT AERO nasal inhaler Place 2 sprays into the nose daily. 1 Inhaler 12  vitamin C (ASCORBIC ACID) 500 MG tablet Take 500 mg by mouth daily.     zolpidem (AMBIEN) 10 MG tablet TAKE 1 TABLET AT BEDTIME AS NEEDED FOR SLEEP 90 tablet 1   No current facility-administered medications on file prior to visit.    No Known Allergies   Assessment/Plan:  1. Hypertension -     Carmela Hurt, Pharm.D [facility] A Division of [facility] [address], [place], [place] [address]  Phone: [phone]; Fax: [phone]

## 2023-07-01 NOTE — Assessment & Plan Note (Signed)
Assessment: BP is controlled in office BP 120/70  mmHg: goal <130/80 Takes BP meds regularly and tolerates them well without any side effects Denies SOB, palpitation, chest pain, headaches,or swelling Follows low salt diet, but does not exercise     Plan:  Continue taking current BP medication  Carvedilol refill sent to the pharmacy as patient was out of refills Patient to buy home BP monitor and bring in for validation at next OV Patient to keep record of BP readings with heart rate and report to [place] at the next visit Exercise at least 150 min per week  Patient to see PharmD in 4 weeks for follow up  Follow up lab(s): none

## 2023-07-01 NOTE — Patient Instructions (Addendum)
No Changes to your BP medication made by your pharmacist Carmela Hurt, PharmD at today's visit:    Bring all of your meds, your BP cuff and your record of home blood pressures to your next appointment.    HOW TO TAKE YOUR BLOOD PRESSURE AT HOME  Rest 5 minutes before taking your blood pressure.  Don't smoke or drink caffeinated beverages for at least 30 minutes before. Take your blood pressure before (not after) you eat. Sit comfortably with your back supported and both feet on the floor (don't cross your legs). Elevate your arm to heart level on a table or a desk. Use the proper sized cuff. It should fit smoothly and snugly around your bare upper arm. There should be enough room to slip a fingertip under the cuff. The bottom edge of the cuff should be 1 inch above the crease of the elbow. Ideally, take 3 measurements at one sitting and record the average.  Important lifestyle changes to control high blood pressure  Intervention  Effect on the BP  Lose extra pounds and watch your waistline Weight loss is one of the most effective lifestyle changes for controlling blood pressure. If you're overweight or obese, losing even a small amount of weight can help reduce blood pressure. Blood pressure might go down by about 1 millimeter of mercury (mm Hg) with each kilogram (about 2.2 pounds) of weight lost.  Exercise regularly As a general goal, aim for at least 30 minutes of moderate physical activity every day. Regular physical activity can lower high blood pressure by about 5 to 8 mm Hg.  Eat a healthy diet Eating a diet rich in whole grains, fruits, vegetables, and low-fat dairy products and low in saturated fat and cholesterol. A healthy diet can lower high blood pressure by up to 11 mm Hg.  Reduce salt (sodium) in your diet Even a small reduction of sodium in the diet can improve heart health and reduce high blood pressure by about 5 to 6 mm Hg.  Limit alcohol One drink equals 12 ounces of  beer, 5 ounces of wine, or 1.5 ounces of 80-proof liquor.  Limiting alcohol to less than one drink a day for women or two drinks a day for men can help lower blood pressure by about 4 mm Hg.   If you have any questions or concerns please use My Chart to send questions or call the office at [phone]  Meal Planning:  Meal planning is the key to setting you up for success. Here are some examples of healthy meal options.  Breakfast  Option 1: Omelette with vegetables (1 egg, spinach, mushrooms, or other vegetable of your choice), 2 slices whole-grain toast, tip of thumb size butter or soft margarine,  cup low-fat milk or yogurt  Option 2: steel-cut rolled oats (? cup dry), 1 tbsp peanut butter added to cooked oats,  cup low-fat milk.  Option 3: 2 slices whole-grain or rye toast with avocado spread ( small avocado mased with herbs and pepper to taste), 1 poached egg or [place] up (cooked to your liking)  Option 4:  cup plain 0% [place] yogurt topped with  cup berries and  cup walnuts or almonds, 2 slices whole-grain or rye toast, tip of thumb size soft margarine/butter  Lunch:  Option 1: 2 cups red lentil soup, green salad with 1 tbsp homemade vinaigrette (extra virgin olive oil and vinegar of choice plus spices)  Option 2: 3 oz. roasted chicken, 2 slices whole-grain bread, 2  tsp mayonnaise, mustard, lettuce, tomato if desired, 1 fruit (example: medium-sized apple or small pear)  Option 3: 3 oz. tuna packed in water, 1 whole-wheat pita (6 inch), 2 tsp mayonnaise, lettuce, tomato, or other non-starchy vegetable of your choice, 1 fruit (example: medium-sized apple or small pear)  Option 4: 1 serving of garden veggie buddha bowl with lentils and tahini sauce and 1 cup berries topped with  cup plain 0% Greek yogurt  Dinner:  Option 1: 1 serving roasted cauliflower salad, 3-4 oz. grilled or baked pork loin chop, 1/2 cup mashed potato, or brown rice or quinoa  Option 2: 1 serving  fish (baked, grilled or air fried), green salad, 1 tbsp homemade vinaigrette,  cup cooked couscous  Option 3: 1 cup cooked whole grained pasta (example: spaghetti, spirals, macaroni),  cup favorite pasta sauce (preferably homemade), 3-4 oz. grilled or baked chicken, green salad, 1 tbsp homemade vinaigrette  Option 4: 1 serving oven roasted salmon,  cup mashed sweet potato or couscous or brown rice or quinoa, broccoli (steamed or roasted)  Healthy snacks:   Carrots or celery with 1 tbsp of hummus   1 medium-sized fruit (apple or orange)   1 cup plain 0% [place] yogurt with  cup berries   Half apple, sliced, with 1 tbsp (15 mL) peanut or almond butter

## 2023-07-21 ENCOUNTER — Encounter: Payer: Self-pay | Admitting: Internal Medicine

## 2023-07-21 ENCOUNTER — Telehealth: Payer: Self-pay | Admitting: Internal Medicine

## 2023-07-21 NOTE — Telephone Encounter (Signed)
Ok this is done hardcopy to [facility]

## 2023-07-21 NOTE — Telephone Encounter (Signed)
Patient called and stated that she needs something faxed to:  [facility] - Fax #:  [phone]  This paper just needs to state that Dr. Jonny Ruiz is aware of her breast surgery that she had in 2007 or 2008.  Please send this so patient can be helped by their service.  Her surgery was on her left breast.

## 2023-07-22 NOTE — Telephone Encounter (Signed)
Letter faxed.

## 2023-07-29 NOTE — Progress Notes (Deleted)
Patient ID: Laura Poole                 DOB: [birth year]                      MRN: [id number]      HPI: Laura Poole is a 72 y.o. female referred by Dr. Mayford Knife to HTN clinic. PMH is significant for breast cancer, atypical chest pain, exertional dyspnea and GERD.   Patient was seen last in Feb, 2024, by pharmD cancelled f/u appointment never rescheduled. Out of refill on one of the medication so scheduled appointment with PharmD Patient presented today for BP follow up, reports she does not check her BP at home as her home BP monitor got lost at work. She take her BP medications regularly and tolerates them well. States she gets tense at office visits and her BP runs high at last PCP visit it was 170/80. She has made some significant changes to her diet and lower her salt intake significantly. Avoids canned food, french fries, deli meat. She loves potato chips but only have them once a month. Patient planing to buy new home monitor and will start exercising regularly.  Have stationary bike at home will do biking 30 min per day.    Current HTN meds:  Losartan 100 mg daily  Spironolactone 25 mg daily Carvedilol 12.5 mg TWICE DAILY Amlodipine 2.5 mg daily  BP goal: <130/80  Exercise: exercise bike - does not use  Diet: low salt diet   Alcohol: 1 drink per week  Smoking: quit long time back (almost 50 years ago)   Social History   Socioeconomic History   Marital status: Single    Spouse name: Not on file   Number of children: Not on file   Years of education: 12   Highest education level: High school graduate  Occupational History   Not on file  Tobacco Use   Smoking status: Never   Smokeless tobacco: Never  Vaping Use   Vaping status: Never Used  Substance and Sexual Activity   Alcohol use: Yes    Alcohol/week: 2.0 standard drinks of alcohol    Types: 2 Cans of beer per week    Comment: rarely   Drug use: No   Sexual activity: Not on file  Other Topics Concern   Not  on file  Social History Narrative   Not on file   Social Determinants of Health   Financial Resource Strain: Low Risk  (03/15/2023)   Overall Financial Resource Strain (CARDIA)    Difficulty of Paying Living Expenses: Not hard at all  Food Insecurity: No Food Insecurity (03/15/2023)   Hunger Vital Sign    Worried About Running Out of Food in the Last Year: Never true    Ran Out of Food in the Last Year: Never true  Transportation Needs: No Transportation Needs (03/15/2023)   PRAPARE - Administrator, Civil Service (Medical): No    Lack of Transportation (Non-Medical): No  Physical Activity: Insufficiently Active (03/15/2023)   Exercise Vital Sign    Days of Exercise per Week: 3 days    Minutes of Exercise per Session: 30 min  Stress: No Stress Concern Present (03/15/2023)   [employer] of Occupational Health - Occupational Stress Questionnaire    Feeling of Stress : Not at all  Social Connections: Unknown (06/27/2023)   [facility]   Social Network    Social Network: Not on file  Intimate Partner Violence: Unknown (06/27/2023)   Received from [facility]   HITS    Physically Hurt: Not on file    Insult or Talk Down To: Not on file    Threaten Physical Harm: Not on file    Scream or Curse: Not on file     Wt Readings from Last 3 Encounters:  04/07/23 179 lb (81.2 kg)  03/15/23 187 lb (84.8 kg)  11/17/22 187 lb 4 oz (84.9 kg)   BP Readings from Last 3 Encounters:  07/01/23 120/70  04/07/23 130/76  12/02/22 (!) 149/91   Pulse Readings from Last 3 Encounters:  07/01/23 78  04/07/23 65  12/02/22 80    Renal function: CrCl cannot be calculated (Patient's most recent lab result is older than the maximum 21 days allowed.).  Past Medical History:  Diagnosis Date   Aortic stenosis    mild to moderate on echo 02/2021 with no AS on echo 02/2022 and none by echo 02/28/2023   Asymmetric septal hypertrophy (HCC)    Severe by echo but mild by cardiac  MRI in 2018 with no evidence of HOCM   Bilateral leg cramps 09/01/2015   Breast cancer (HCC)    left   Essential hypertension 01/07/2010   Qualifier: Diagnosis of  By: Alwyn Ren MD, Chrissie Noa     GERD (gastroesophageal reflux disease)    H/O: hysterectomy    History of breast cancer 2006   History of colonic polyps    HLD (hyperlipidemia)    Mitral regurgitation 08/05/2015   Moderate by echo 02/2023   Personal history of chemotherapy 2006   Left Breast Cancer   Personal history of radiation therapy 2006   Left Breast Cancer   Snoring 03/30/2017    Current Outpatient Medications on File Prior to Visit  Medication Sig Dispense Refill   acetaminophen (TYLENOL) 500 MG tablet Take 500 mg by mouth every 6 (six) hours as needed for mild pain or headache.     albuterol (VENTOLIN HFA) 108 (90 Base) MCG/ACT inhaler USE 2 INHALATIONS EVERY 6 HOURS AS NEEDED FOR WHEEZING OR SHORTNESS OF BREATH 18 g 5   amLODipine (NORVASC) 2.5 MG tablet TAKE 1 TABLET DAILY 180 tablet 3   aspirin 81 MG tablet Take 1 tablet (81 mg total) by mouth daily. 30 tablet 6   carvedilol (COREG) 12.5 MG tablet Take 1 tablet (12.5 mg total) by mouth 2 (two) times daily. 180 tablet 3   COVID-19 mRNA bivalent vaccine, Moderna, (MODERNA COVID-19 BIVAL BOOSTER) 50 MCG/0.5ML injection Inject into the muscle. 0.5 mL 0   fluticasone-salmeterol (ADVAIR DISKUS) 250-50 MCG/ACT AEPB Inhale 1 puff into the lungs in the morning and at bedtime. 3 each 3   gabapentin (NEURONTIN) 300 MG capsule TAKE 1 CAPSULE AT BEDTIME 270 capsule 1   losartan (COZAAR) 100 MG tablet TAKE 1 TABLET DAILY 90 tablet 3   meloxicam (MOBIC) 7.5 MG tablet TAKE 1 TABLET DAILY 90 tablet 3   rosuvastatin (CRESTOR) 10 MG tablet TAKE 1 TABLET DAILY 90 tablet 3   solifenacin (VESICARE) 5 MG tablet TAKE 1 TABLET DAILY 90 tablet 3   spironolactone (ALDACTONE) 25 MG tablet TAKE 1 TABLET TWICE A DAY 180 tablet 3   tiZANidine (ZANAFLEX) 2 MG tablet TAKE 1 TABLET EVERY 6 HOURS AS  NEEDED FOR MUSCLE SPASMS 360 tablet 2   triamcinolone (NASACORT) 55 MCG/ACT AERO nasal inhaler Place 2 sprays into the nose daily. 1 Inhaler 12   vitamin C (ASCORBIC ACID) 500 MG tablet  Take 500 mg by mouth daily.     zolpidem (AMBIEN) 10 MG tablet TAKE 1 TABLET AT BEDTIME AS NEEDED FOR SLEEP 90 tablet 1   No current facility-administered medications on file prior to visit.    No Known Allergies   Assessment/Plan:  1. Hypertension -     Carmela Hurt, Pharm.D [facility] A Division of [facility] [address] [place], [place] [address]  Phone: [phone]; Fax: [phone]

## 2023-08-01 ENCOUNTER — Ambulatory Visit: Payer: Medicare (Managed Care)

## 2023-08-10 ENCOUNTER — Ambulatory Visit: Payer: Medicare (Managed Care)

## 2023-08-18 ENCOUNTER — Ambulatory Visit: Payer: Medicare (Managed Care)

## 2023-08-18 ENCOUNTER — Ambulatory Visit: Payer: Medicare (Managed Care) | Admitting: Family Medicine

## 2023-08-18 ENCOUNTER — Encounter: Payer: Self-pay | Admitting: Family Medicine

## 2023-08-18 VITALS — BP 124/76 | HR 75 | Temp 97.6°F | Ht 68.0 in | Wt 187.0 lb

## 2023-08-18 DIAGNOSIS — M25551 Pain in right hip: Secondary | ICD-10-CM

## 2023-08-18 DIAGNOSIS — S299XXD Unspecified injury of thorax, subsequent encounter: Secondary | ICD-10-CM | POA: Diagnosis not present

## 2023-08-18 DIAGNOSIS — W19XXXA Unspecified fall, initial encounter: Secondary | ICD-10-CM

## 2023-08-18 DIAGNOSIS — M160 Bilateral primary osteoarthritis of hip: Secondary | ICD-10-CM | POA: Diagnosis not present

## 2023-08-18 DIAGNOSIS — R0781 Pleurodynia: Secondary | ICD-10-CM

## 2023-08-18 DIAGNOSIS — M1990 Unspecified osteoarthritis, unspecified site: Secondary | ICD-10-CM | POA: Insufficient documentation

## 2023-08-18 DIAGNOSIS — R252 Cramp and spasm: Secondary | ICD-10-CM | POA: Insufficient documentation

## 2023-08-18 DIAGNOSIS — N3281 Overactive bladder: Secondary | ICD-10-CM | POA: Insufficient documentation

## 2023-08-18 NOTE — Progress Notes (Signed)
Subjective:     Patient ID: Laura Poole, female    DOB: 08/23/1951, 72 y.o.   MRN: [id number]  Chief Complaint  Patient presents with   Flank Pain    Had a fall 2-3 weeks ago in bathroom, fell on floor on her side, both sides are still sore but right side is worse    Flank Pain Pertinent negatives include no abdominal pain, chest pain, dysuria, fever, headaches or tingling.     History of Present Illness          States she fell in her bathroom 3 wks ago. Fell from standing onto the floor on her right side. States she may have slipped on the floor. She grabbed the shower curtain to prevent her fall but it did not hold. Denies LOC, hitting her head or having neck or back pain.  Denies feeling dizzy, chest pain, shortness of breath or sick at all at the time.  Since then she has had pain in her right hip that is worse with prolonged sitting and getting up.  Throbbing pain. No radiation.  She also has tenderness over her right ribs.   Denies having taken the Ambien that night. She does take a muscle relaxant and gabapentin daily.  States she takes meloxicam daily as well.    Health Maintenance Due  Topic Date Due   Zoster Vaccines- Shingrix (1 of 2) 04/11/1972   DTaP/Tdap/Td (1 - Tdap) 12/05/2012    Past Medical History:  Diagnosis Date   Aortic stenosis    mild to moderate on echo 02/2021 with no AS on echo 02/2022 and none by echo 02/28/2023   Asymmetric septal hypertrophy    Severe by echo but mild by cardiac MRI in 2018 with no evidence of HOCM   Bilateral leg cramps 09/01/2015   Breast cancer (HCC)    left   Essential hypertension 01/07/2010   Qualifier: Diagnosis of  By: Alwyn Ren MD, Chrissie Noa     GERD (gastroesophageal reflux disease)    H/O: hysterectomy    History of breast cancer 2006   History of colonic polyps    HLD (hyperlipidemia)    Mitral regurgitation 08/05/2015   Moderate by echo 02/2023   Personal history of chemotherapy 2006   Left Breast Cancer    Personal history of radiation therapy 2006   Left Breast Cancer   Snoring 03/30/2017    Past Surgical History:  Procedure Laterality Date   ABDOMINAL HYSTERECTOMY     BREAST LUMPECTOMY Left 2006   COLONOSCOPY W/ POLYPECTOMY  2004, 2009   Dr Ewing Schlein   TUBAL LIGATION      Family History  Problem Relation Age of Onset   Heart attack Father 16   Hypertension Mother    Coronary artery disease Mother    Aneurysm Brother    Asthma Other        nephew   Hyperlipidemia Other        nephew   Sudden death Other        nephew   Breast cancer Paternal Grandmother    Colon cancer Neg Hx     Social History   Socioeconomic History   Marital status: Single    Spouse name: Not on file   Number of children: Not on file   Years of education: 12   Highest education level: High school graduate  Occupational History   Not on file  Tobacco Use   Smoking status: Never   Smokeless tobacco: Never  Vaping Use   Vaping status: Never Used  Substance and Sexual Activity   Alcohol use: Yes    Alcohol/week: 2.0 standard drinks of alcohol    Types: 2 Cans of beer per week    Comment: rarely   Drug use: No   Sexual activity: Not on file  Other Topics Concern   Not on file  Social History Narrative   Not on file   Social Determinants of Health   Financial Resource Strain: Low Risk  (03/15/2023)   Overall Financial Resource Strain (CARDIA)    Difficulty of Paying Living Expenses: Not hard at all  Food Insecurity: No Food Insecurity (03/15/2023)   Hunger Vital Sign    Worried About Running Out of Food in the Last Year: Never true    Ran Out of Food in the Last Year: Never true  Transportation Needs: No Transportation Needs (03/15/2023)   PRAPARE - Administrator, Civil Service (Medical): No    Lack of Transportation (Non-Medical): No  Physical Activity: Insufficiently Active (03/15/2023)   Exercise Vital Sign    Days of Exercise per Week: 3 days    Minutes of Exercise per  Session: 30 min  Stress: No Stress Concern Present (03/15/2023)   [employer] of Occupational Health - Occupational Stress Questionnaire    Feeling of Stress : Not at all  Social Connections: Unknown (06/27/2023)   Received from [facility]   Social Network    Social Network: Not on file  Intimate Partner Violence: Unknown (06/27/2023)   Received from [facility]   HITS    Physically Hurt: Not on file    Insult or Talk Down To: Not on file    Threaten Physical Harm: Not on file    Scream or Curse: Not on file    Outpatient Medications Prior to Visit  Medication Sig Dispense Refill   acetaminophen (TYLENOL) 500 MG tablet Take 500 mg by mouth every 6 (six) hours as needed for mild pain or headache.     albuterol (VENTOLIN HFA) 108 (90 Base) MCG/ACT inhaler USE 2 INHALATIONS EVERY 6 HOURS AS NEEDED FOR WHEEZING OR SHORTNESS OF BREATH 18 g 5   amLODipine (NORVASC) 2.5 MG tablet TAKE 1 TABLET DAILY 180 tablet 3   aspirin 81 MG tablet Take 1 tablet (81 mg total) by mouth daily. 30 tablet 6   carvedilol (COREG) 12.5 MG tablet Take 1 tablet (12.5 mg total) by mouth 2 (two) times daily. 180 tablet 3   fluticasone-salmeterol (ADVAIR DISKUS) 250-50 MCG/ACT AEPB Inhale 1 puff into the lungs in the morning and at bedtime. 3 each 3   gabapentin (NEURONTIN) 300 MG capsule TAKE 1 CAPSULE AT BEDTIME 270 capsule 1   losartan (COZAAR) 100 MG tablet TAKE 1 TABLET DAILY 90 tablet 3   meloxicam (MOBIC) 7.5 MG tablet TAKE 1 TABLET DAILY 90 tablet 3   rosuvastatin (CRESTOR) 10 MG tablet TAKE 1 TABLET DAILY 90 tablet 3   solifenacin (VESICARE) 5 MG tablet TAKE 1 TABLET DAILY 90 tablet 3   spironolactone (ALDACTONE) 25 MG tablet TAKE 1 TABLET TWICE A DAY 180 tablet 3   tiZANidine (ZANAFLEX) 2 MG tablet TAKE 1 TABLET EVERY 6 HOURS AS NEEDED FOR MUSCLE SPASMS 360 tablet 2   triamcinolone (NASACORT) 55 MCG/ACT AERO nasal inhaler Place 2 sprays into the nose daily. 1 Inhaler 12   vitamin C (ASCORBIC ACID)  500 MG tablet Take 500 mg by mouth daily.     zolpidem (AMBIEN)  10 MG tablet TAKE 1 TABLET AT BEDTIME AS NEEDED FOR SLEEP 90 tablet 1   COVID-19 mRNA bivalent vaccine, Moderna, (MODERNA COVID-19 BIVAL BOOSTER) 50 MCG/0.5ML injection Inject into the muscle. (Patient not taking: Reported on 08/18/2023) 0.5 mL 0   No facility-administered medications prior to visit.    No Known Allergies  Review of Systems  Constitutional:  Negative for chills, fever and malaise/fatigue.  Eyes:  Negative for blurred vision and double vision.  Respiratory:  Negative for cough and shortness of breath.   Cardiovascular:  Negative for chest pain, palpitations and leg swelling.  Gastrointestinal:  Negative for abdominal pain, constipation, diarrhea, nausea and vomiting.  Genitourinary:  Negative for dysuria, frequency and urgency.  Musculoskeletal:  Positive for falls and joint pain.       Right side pain   Neurological:  Negative for dizziness, tingling, focal weakness and headaches.       Objective:    Physical Exam Constitutional:      General: She is not in acute distress.    Appearance: She is not ill-appearing.  HENT:     Mouth/Throat:     Mouth: Mucous membranes are moist.     Pharynx: Oropharynx is clear.  Eyes:     Extraocular Movements: Extraocular movements intact.     Conjunctiva/sclera: Conjunctivae normal.  Cardiovascular:     Rate and Rhythm: Normal rate and regular rhythm.  Pulmonary:     Effort: Pulmonary effort is normal.     Breath sounds: Normal breath sounds.  Abdominal:     General: There is no distension.     Palpations: Abdomen is soft.     Tenderness: There is no abdominal tenderness. There is no right CVA tenderness, left CVA tenderness, guarding or rebound.  Musculoskeletal:     Cervical back: Normal, normal range of motion and neck supple. No tenderness.     Thoracic back: Normal.     Lumbar back: Normal.     Right lower leg: No edema.     Left lower leg: No edema.      Comments: Right lateral rib TTP without crepitus Right lateral hip pain with internal and external rotation. Non tender. No locking, popping or giving away.  RLE is neurovascularly intact.   Lymphadenopathy:     Cervical: No cervical adenopathy.  Skin:    General: Skin is warm and dry.     Findings: No bruising.  Neurological:     General: No focal deficit present.     Mental Status: She is alert and oriented to person, place, and time.     Motor: No weakness.     Gait: Gait abnormal.  Psychiatric:        Mood and Affect: Mood normal.        Behavior: Behavior normal.        Thought Content: Thought content normal.      BP 124/76 (BP Location: Left Arm, Patient Position: Sitting, Cuff Size: Large)   Pulse 75   Temp 97.6 F (36.4 C) (Temporal)   Ht 5\' 8"  (1.727 m)   Wt 187 lb (84.8 kg)   SpO2 98%   BMI 28.43 kg/m  Wt Readings from Last 3 Encounters:  08/18/23 187 lb (84.8 kg)  04/07/23 179 lb (81.2 kg)  03/15/23 187 lb (84.8 kg)       Assessment & Plan:   Problem List Items Addressed This Visit   None Visit Diagnoses     Pain in right hip    -  Primary   Relevant Orders   DG HIP UNILAT WITH PELVIS 2-3 VIEWS RIGHT   Fall, initial encounter       Relevant Orders   DG Ribs Unilateral W/Chest Right   DG HIP UNILAT WITH PELVIS 2-3 VIEWS RIGHT   Rib pain on right side       Relevant Orders   DG Ribs Unilateral W/Chest Right      No red flag symptoms.  Mechanical fall.  No LOC or head injury.  Reviewed medications with patient and advised against taking 10 mg of Ambien, recommend taking 1/2 tablet as needed, since she does not take this nightly.  I also advised against taking muscle relaxant nightly if not needed.  X rays ordered. Continue conservative management.  Follow up pending results and if not continuing to improve. She will be taking a cruise over Thanksgiving.   I have discontinued Adela Lank Shaft's Moderna COVID-19 Bival Booster. I am also  having her maintain her aspirin, acetaminophen, ascorbic acid, triamcinolone, gabapentin, meloxicam, rosuvastatin, spironolactone, losartan, albuterol, fluticasone-salmeterol, zolpidem, amLODipine, solifenacin, tiZANidine, and carvedilol.  No orders of the defined types were placed in this encounter.

## 2023-08-18 NOTE — Patient Instructions (Signed)
Please go downstairs for X rays of your ribs and hip.

## 2023-08-21 NOTE — Progress Notes (Unsigned)
Patient ID: Laura Poole                 DOB: 24-Jul-1951                      MRN: [id number]      HPI: Laura Poole is a 72 y.o. female referred by Dr. Mayford Knife to HTN clinic. PMH is significant for breast cancer, atypical chest pain, exertional dyspnea and GERD.   Patient was seen last in Feb, 2024, by pharmD cancelled f/u appointment never rescheduled. Out of refill on one of the medication so scheduled appointment with PharmD. Patient saw me last on 07/01/2023. BP was at goal. We discussed importance of low salt diet and regular exercise to lower BP down.   Patient presented today for BP follow up, reports her BP at home ~125-130/80-85 range. She was at PCP and her BP was around 124/80. She has made some significant changes to her diet and started doing regular exercise. She works 3rd shift today she was rushing for the appointment and she forgot to bring her BP monitor.  She is excited go on [place] cruise this Thanksgiving break.     Current HTN meds:  Losartan 100 mg daily  Spironolactone 25 mg twice daily  Carvedilol 12.5 mg TWICE DAILY Amlodipine 2.5 mg daily  BP goal: <130/80  Exercise: exercise bike - 10 min 3 times per week Diet: low salt diet, cut back on soda. Drinks plenty of water   Alcohol: 1 drink per week  Smoking: quit long time back (almost 50 years ago)   Social History   Socioeconomic History   Marital status: Single    Spouse name: Not on file   Number of children: Not on file   Years of education: 12   Highest education level: High school graduate  Occupational History   Not on file  Tobacco Use   Smoking status: Never   Smokeless tobacco: Never  Vaping Use   Vaping status: Never Used  Substance and Sexual Activity   Alcohol use: Yes    Alcohol/week: 2.0 standard drinks of alcohol    Types: 2 Cans of beer per week    Comment: rarely   Drug use: No   Sexual activity: Not on file  Other Topics Concern   Not on file  Social History Narrative    Not on file   Social Determinants of Health   Financial Resource Strain: Low Risk  (03/15/2023)   Overall Financial Resource Strain (CARDIA)    Difficulty of Paying Living Expenses: Not hard at all  Food Insecurity: No Food Insecurity (03/15/2023)   Hunger Vital Sign    Worried About Running Out of Food in the Last Year: Never true    Ran Out of Food in the Last Year: Never true  Transportation Needs: No Transportation Needs (03/15/2023)   PRAPARE - Administrator, Civil Service (Medical): No    Lack of Transportation (Non-Medical): No  Physical Activity: Insufficiently Active (03/15/2023)   Exercise Vital Sign    Days of Exercise per Week: 3 days    Minutes of Exercise per Session: 30 min  Stress: No Stress Concern Present (03/15/2023)   [employer] of Occupational Health - Occupational Stress Questionnaire    Feeling of Stress : Not at all  Social Connections: Unknown (06/27/2023)   [facility]   Social Network    Social Network: Not on file  Intimate Partner Violence: Unknown (  06/27/2023)   Received from [facility]   HITS    Physically Hurt: Not on file    Insult or Talk Down To: Not on file    Threaten Physical Harm: Not on file    Scream or Curse: Not on file     Wt Readings from Last 3 Encounters:  08/22/23 185 lb (83.9 kg)  08/18/23 187 lb (84.8 kg)  04/07/23 179 lb (81.2 kg)   BP Readings from Last 3 Encounters:  08/22/23 122/80  08/18/23 124/76  07/01/23 120/70   Pulse Readings from Last 3 Encounters:  08/22/23 70  08/18/23 75  07/01/23 78    Renal function: CrCl cannot be calculated (Patient's most recent lab result is older than the maximum 21 days allowed.).  Past Medical History:  Diagnosis Date   Aortic stenosis    mild to moderate on echo 02/2021 with no AS on echo 02/2022 and none by echo 02/28/2023   Asymmetric septal hypertrophy    Severe by echo but mild by cardiac MRI in 2018 with no evidence of HOCM   Bilateral  leg cramps 09/01/2015   Breast cancer (HCC)    left   Essential hypertension 01/07/2010   Qualifier: Diagnosis of  By: Alwyn Ren MD, Chrissie Noa     GERD (gastroesophageal reflux disease)    H/O: hysterectomy    History of breast cancer 2006   History of colonic polyps    HLD (hyperlipidemia)    Mitral regurgitation 08/05/2015   Moderate by echo 02/2023   Personal history of chemotherapy 2006   Left Breast Cancer   Personal history of radiation therapy 2006   Left Breast Cancer   Snoring 03/30/2017    Current Outpatient Medications on File Prior to Visit  Medication Sig Dispense Refill   acetaminophen (TYLENOL) 500 MG tablet Take 500 mg by mouth every 6 (six) hours as needed for mild pain or headache.     albuterol (VENTOLIN HFA) 108 (90 Base) MCG/ACT inhaler USE 2 INHALATIONS EVERY 6 HOURS AS NEEDED FOR WHEEZING OR SHORTNESS OF BREATH 18 g 5   amLODipine (NORVASC) 2.5 MG tablet TAKE 1 TABLET DAILY 180 tablet 3   aspirin 81 MG tablet Take 1 tablet (81 mg total) by mouth daily. 30 tablet 6   carvedilol (COREG) 12.5 MG tablet Take 1 tablet (12.5 mg total) by mouth 2 (two) times daily. 180 tablet 3   fluticasone-salmeterol (ADVAIR DISKUS) 250-50 MCG/ACT AEPB Inhale 1 puff into the lungs in the morning and at bedtime. 3 each 3   gabapentin (NEURONTIN) 300 MG capsule TAKE 1 CAPSULE AT BEDTIME 270 capsule 1   losartan (COZAAR) 100 MG tablet TAKE 1 TABLET DAILY 90 tablet 3   meloxicam (MOBIC) 7.5 MG tablet TAKE 1 TABLET DAILY 90 tablet 3   rosuvastatin (CRESTOR) 10 MG tablet TAKE 1 TABLET DAILY 90 tablet 3   solifenacin (VESICARE) 5 MG tablet TAKE 1 TABLET DAILY 90 tablet 3   spironolactone (ALDACTONE) 25 MG tablet TAKE 1 TABLET TWICE A DAY 180 tablet 3   tiZANidine (ZANAFLEX) 2 MG tablet TAKE 1 TABLET EVERY 6 HOURS AS NEEDED FOR MUSCLE SPASMS 360 tablet 2   triamcinolone (NASACORT) 55 MCG/ACT AERO nasal inhaler Place 2 sprays into the nose daily. 1 Inhaler 12   vitamin C (ASCORBIC ACID) 500 MG  tablet Take 500 mg by mouth daily.     zolpidem (AMBIEN) 10 MG tablet TAKE 1 TABLET AT BEDTIME AS NEEDED FOR SLEEP 90 tablet 1  No current facility-administered medications on file prior to visit.    No Known Allergies  Hypertension  Assessment: BP is controlled in office BP 122/80 heart rate 70 ; goal <130/80 Takes BP meds regularly and tolerates them well without any side effects Denies SOB, palpitation, chest pain, headaches,or swelling Follows low salt diet, eats healthy homoe cooked meals most days of the week  Use exercise bike for 10-15 min 3 times per week and go for walks during lunch break    Plan:  Continue taking current BP medication (Losartan 100mg  daily,Spironolactone 25mg  twice daily,Carvedilol 12.5mg  twice daily,Amlodipine 2.5 mg daily)  Continue with healthy low salt diet and regular exercise  Patient to  bring in for validation at next OV Patient to keep record of BP readings with heart rate and report to [place] at the next visit Patient to see PharmD in 12 weeks for follow up  Follow up lab(s): none       Carmela Hurt, [place].D [facility] A Division of [place] [facility] [address], [place], [place] [address]  Phone: [phone]; Fax: [phone]

## 2023-08-22 ENCOUNTER — Ambulatory Visit: Payer: Medicare (Managed Care) | Attending: Cardiology | Admitting: Student

## 2023-08-22 ENCOUNTER — Encounter: Payer: Self-pay | Admitting: Student

## 2023-08-22 VITALS — BP 122/80 | HR 70 | Ht 67.5 in | Wt 185.0 lb

## 2023-08-22 DIAGNOSIS — I10 Essential (primary) hypertension: Secondary | ICD-10-CM | POA: Diagnosis not present

## 2023-08-22 NOTE — Assessment & Plan Note (Signed)
Assessment: BP is controlled in office BP 122/80 heart rate 70 ; goal <130/80 Takes BP meds regularly and tolerates them well without any side effects Denies SOB, palpitation, chest pain, headaches,or swelling Follows low salt diet, eats healthy homoe cooked meals most days of the week  Use exercise bike for 10-15 min 3 times per week and go for walks during lunch break   Plan:  Continue taking current BP medication (Losartan 100mg  daily,Spironolactone 25mg  twice daily,Carvedilol 12.5mg  twice daily,Amlodipine 2.5 mg daily)  Continue with healthy low salt diet and regular exercise  Patient to  bring in for validation at next OV Patient to keep record of BP readings with heart rate and report to [place] at the next visit Patient to see PharmD in 12 weeks for follow up  Follow up lab(s): none

## 2023-08-22 NOTE — Patient Instructions (Addendum)
No Changes made by your pharmacist Cammy Copa, PharmD at today's visit:     Bring all of your meds, your BP cuff and your record of home blood pressures to your next appointment.    HOW TO TAKE YOUR BLOOD PRESSURE AT HOME  Rest 5 minutes before taking your blood pressure.  Don't smoke or drink caffeinated beverages for at least 30 minutes before. Take your blood pressure before (not after) you eat. Sit comfortably with your back supported and both feet on the floor (don't cross your legs). Elevate your arm to heart level on a table or a desk. Use the proper sized cuff. It should fit smoothly and snugly around your bare upper arm. There should be enough room to slip a fingertip under the cuff. The bottom edge of the cuff should be 1 inch above the crease of the elbow. Ideally, take 3 measurements at one sitting and record the average.  Important lifestyle changes to control high blood pressure  Intervention  Effect on the BP  Lose extra pounds and watch your waistline Weight loss is one of the most effective lifestyle changes for controlling blood pressure. If you're overweight or obese, losing even a small amount of weight can help reduce blood pressure. Blood pressure might go down by about 1 millimeter of mercury (mm Hg) with each kilogram (about 2.2 pounds) of weight lost.  Exercise regularly As a general goal, aim for at least 30 minutes of moderate physical activity every day. Regular physical activity can lower high blood pressure by about 5 to 8 mm Hg.  Eat a healthy diet Eating a diet rich in whole grains, fruits, vegetables, and low-fat dairy products and low in saturated fat and cholesterol. A healthy diet can lower high blood pressure by up to 11 mm Hg.  Reduce salt (sodium) in your diet Even a small reduction of sodium in the diet can improve heart health and reduce high blood pressure by about 5 to 6 mm Hg.  Limit alcohol One drink equals 12 ounces of beer, 5 ounces of  wine, or 1.5 ounces of 80-proof liquor.  Limiting alcohol to less than one drink a day for women or two drinks a day for men can help lower blood pressure by about 4 mm Hg.   If you have any questions or concerns please use My Chart to send questions or call the office at [phone]

## 2023-09-06 ENCOUNTER — Encounter: Payer: Self-pay | Admitting: Family Medicine

## 2023-09-06 ENCOUNTER — Ambulatory Visit (INDEPENDENT_AMBULATORY_CARE_PROVIDER_SITE_OTHER): Payer: Medicare (Managed Care) | Admitting: Family Medicine

## 2023-09-06 VITALS — BP 132/74 | HR 94 | Temp 97.6°F | Ht 67.5 in | Wt 187.0 lb

## 2023-09-06 DIAGNOSIS — K0510 Chronic gingivitis, plaque induced: Secondary | ICD-10-CM | POA: Diagnosis not present

## 2023-09-06 DIAGNOSIS — S00502A Unspecified superficial injury of oral cavity, initial encounter: Secondary | ICD-10-CM | POA: Diagnosis not present

## 2023-09-06 MED ORDER — AMOXICILLIN-POT CLAVULANATE 875-125 MG PO TABS: 1.0000 | ORAL_TABLET | Freq: Two times a day (BID) | ORAL | 0 refills | Status: AC

## 2023-09-06 NOTE — Progress Notes (Signed)
   Acute Office Visit  Subjective:     Patient ID: Laura Poole, female    DOB: 09-28-1951, 72 y.o.   MRN: [id number]  Chief Complaint  Patient presents with   Mass    Bump on inside of gums on bottom left side, told dentist and said it looked like an abscess and was told to get oragel but keep partials in (were put in yesterday). Tried peroxide and rinse as well. First noticed the bump Saturday but they told her to still wear partials over it    HPI Patient is in today for evaluation of left lower and side gum swelling, redness, pain. Reports that she has new lower partials from her dentist. Reports intense pain when placing the partials in her mouth so that she can eat. Reports that she has been taking ibuprofen 3 times a day with some relief. Has been using Orajel as well as Orajel rinse with little relief. States that she was told this might be an abscess, but no treatment was given. Denies abdominal pain, nausea, diarrhea, rash, fever, discharge, previous symptoms, other symptoms today.  ROS Per HPI      Objective:    BP 132/74 (BP Location: Left Arm, Patient Position: Sitting, Cuff Size: Large)   Pulse 94   Temp 97.6 F (36.4 C) (Temporal)   Ht 5' 7.5" (1.715 m)   Wt 187 lb (84.8 kg)   SpO2 97%   BMI 28.86 kg/m    Physical Exam Vitals and nursing note reviewed.  Constitutional:      Appearance: Normal appearance. She is normal weight.  HENT:     Head: Normocephalic and atraumatic.     Mouth/Throat:     Dentition: Gingival swelling present.      Comments: Missing teeth, area of erythema, tenderness. No discharge noted Neurological:     Mental Status: She is alert.     No results found for any visits on 09/06/23.      Assessment & Plan:  1. Superficial injury of gingiva with infection, initial encounter May continue Orajel as needed Discussed using partials as tolerated, follow-up with me or with dentist if symptoms are persisting. -  amoxicillin-clavulanate (AUGMENTIN) 875-125 MG tablet; Take 1 tablet by mouth every 12 (twelve) hours for 7 days.  Dispense: 14 tablet; Refill: 0   Meds ordered this encounter  Medications   amoxicillin-clavulanate (AUGMENTIN) 875-125 MG tablet    Sig: Take 1 tablet by mouth every 12 (twelve) hours for 7 days.    Dispense:  14 tablet    Refill:  0    Return if symptoms worsen or fail to improve.  Moshe Cipro, FNP

## 2023-09-27 ENCOUNTER — Encounter: Payer: Self-pay | Admitting: Family Medicine

## 2023-09-27 ENCOUNTER — Ambulatory Visit (INDEPENDENT_AMBULATORY_CARE_PROVIDER_SITE_OTHER): Payer: Medicare (Managed Care) | Admitting: Family Medicine

## 2023-09-27 VITALS — BP 118/80 | HR 75 | Temp 98.4°F | Ht 67.5 in | Wt 184.0 lb

## 2023-09-27 DIAGNOSIS — K1379 Other lesions of oral mucosa: Secondary | ICD-10-CM | POA: Diagnosis not present

## 2023-09-27 DIAGNOSIS — K0510 Chronic gingivitis, plaque induced: Secondary | ICD-10-CM | POA: Diagnosis not present

## 2023-09-27 DIAGNOSIS — S00502A Unspecified superficial injury of oral cavity, initial encounter: Secondary | ICD-10-CM

## 2023-09-27 MED ORDER — AMOXICILLIN-POT CLAVULANATE 875-125 MG PO TABS
1.0000 | ORAL_TABLET | Freq: Two times a day (BID) | ORAL | 0 refills | Status: AC
Start: 2023-09-27 — End: 2023-10-04

## 2023-09-27 NOTE — Progress Notes (Signed)
   Acute Office Visit  Subjective:     Patient ID: Laura Poole, female    DOB: 02-17-1951, 72 y.o.   MRN: [id number]  Chief Complaint  Patient presents with   Mouth Lesions    Follow up visit for abscess on gums. PT originally had a small abscess on the bottom left portion of the mouth. She is now experiencing the same thing on the top right portion. Having trouble eating due to pain and discomfort    Mouth Lesions  Associated symptoms include mouth sores.   Patient is in today for reevaluation of a possible abscess on her gum. States that this is in a different area of her mouth, her inner, upper right gums this time. States that the sore to the L lower inner gums healed with Augmentin treatment from the last visit. She is able to now wear her lower partial.  Has been using ibuprofen and orajel with little relief.  Reports difficulty chewing and eating due to pain. She was seen by me in this office on 09/06/2023 before going out of town. She had recently gotten fitted for partials through her dentist. States that she has a f/u with her dentist later today. No significant weight loss, down 2 lbs. Denies discharge, bleeding from the area, facial swelling, trouble swallowing, fever, chills, other symptoms.  Review of Systems  HENT:  Positive for mouth sores.    Per HPI      Objective:    BP 118/80 (BP Location: Left Arm, Patient Position: Sitting)   Pulse 75   Temp 98.4 F (36.9 C) (Oral)   Ht 5' 7.5" (1.715 m)   Wt 184 lb (83.5 kg)   SpO2 98%   BMI 28.39 kg/m    Physical Exam Vitals and nursing note reviewed.  Constitutional:      General: She is not in acute distress.    Appearance: Normal appearance. She is normal weight.  HENT:     Head: Normocephalic and atraumatic.     Mouth/Throat:     Dentition: Gingival swelling present.      Comments: Missing teeth, area of erythema, tenderness. No discharge noted Musculoskeletal:     Cervical back: Normal range of  motion.  Lymphadenopathy:     Cervical: No cervical adenopathy.  Neurological:     Mental Status: She is alert.     No results found for any visits on 09/27/23.      Assessment & Plan:  1. Superficial injury of gingiva with infection, initial encounter  - amoxicillin-clavulanate (AUGMENTIN) 875-125 MG tablet; Take 1 tablet by mouth every 12 (twelve) hours for 7 days.  Dispense: 14 tablet; Refill: 0  2. Mouth pain (Primary)  - amoxicillin-clavulanate (AUGMENTIN) 875-125 MG tablet; Take 1 tablet by mouth every 12 (twelve) hours for 7 days.  Dispense: 14 tablet; Refill: 0  - Info for local dentists given today   Meds ordered this encounter  Medications   amoxicillin-clavulanate (AUGMENTIN) 875-125 MG tablet    Sig: Take 1 tablet by mouth every 12 (twelve) hours for 7 days.    Dispense:  14 tablet    Refill:  0    Return if symptoms worsen or fail to improve.  Moshe Cipro, FNP

## 2023-09-27 NOTE — Patient Instructions (Addendum)
I have sent in Augmentin for you to take twice a day for 10 days.  This medication can upset your stomach, so I tell everyone to take it with a meal.  Follow-up with me for new or worsening symptoms.  Try giving [facility] a call, [phone] to see if Dr DeShield-Mayes can help you out.

## 2023-10-08 ENCOUNTER — Other Ambulatory Visit: Payer: Self-pay | Admitting: Internal Medicine

## 2023-10-08 DIAGNOSIS — G4700 Insomnia, unspecified: Secondary | ICD-10-CM

## 2023-11-17 ENCOUNTER — Ambulatory Visit: Payer: Self-pay | Admitting: Internal Medicine

## 2023-11-17 NOTE — Telephone Encounter (Signed)
  Chief Complaint: Chills Symptoms: Cough, sore throat Pertinent Negatives: Patient denies fever Disposition: [] ED /[] Urgent Care (no appt availability in office) / [] Appointment(In office/virtual)/ []  [facility]/ [x] Home Care/ [] Refused Recommended Disposition /[] [facility]/ []  Follow-up with PCP Additional Notes: Patient took RSV shot yesterday. Pneumonia shot last week.  Pt had a headache yesterday which started these symptoms. Negative COVID test per pt. Appt scheduled already on 2/10 with PCP. This RN educated pt on home care, new-worsening symptoms, when to call back/seek emergent care. Pt verbalized understanding and agrees to plan.   Reason for Disposition  [1] MILD pain (e.g., does not interfere with normal activities) AND [2] present < 7 days  Answer Assessment - Initial Assessment Questions 1. ONSET: When did the muscle aches or body pains start?     Yesterday morning 6. OTHER SYMPTOMS: Do you have any other symptoms? (e.g., chest pain, weakness, rash, cold or flu symptoms, weight loss)     Chills,cough, sore throat  Protocols used: Muscle Aches and Body Pain-A-AH

## 2023-11-17 NOTE — Telephone Encounter (Signed)
  Additional Notes: This Triage RN attempted to contact the patient, no answer at this time, unable to leave voicemail.

## 2023-11-21 ENCOUNTER — Encounter: Payer: Medicare (Managed Care) | Admitting: Internal Medicine

## 2023-11-22 ENCOUNTER — Ambulatory Visit: Payer: Self-pay

## 2023-12-06 ENCOUNTER — Encounter: Payer: Self-pay | Admitting: Internal Medicine

## 2023-12-06 ENCOUNTER — Ambulatory Visit: Payer: Medicare (Managed Care) | Admitting: Internal Medicine

## 2023-12-06 VITALS — BP 110/76 | HR 75 | Temp 99.2°F | Ht 67.5 in | Wt 182.0 lb

## 2023-12-06 DIAGNOSIS — E785 Hyperlipidemia, unspecified: Secondary | ICD-10-CM | POA: Diagnosis not present

## 2023-12-06 DIAGNOSIS — I10 Essential (primary) hypertension: Secondary | ICD-10-CM

## 2023-12-06 DIAGNOSIS — I340 Nonrheumatic mitral (valve) insufficiency: Secondary | ICD-10-CM | POA: Diagnosis not present

## 2023-12-06 DIAGNOSIS — E7800 Pure hypercholesterolemia, unspecified: Secondary | ICD-10-CM

## 2023-12-06 DIAGNOSIS — E559 Vitamin D deficiency, unspecified: Secondary | ICD-10-CM | POA: Diagnosis not present

## 2023-12-06 DIAGNOSIS — E538 Deficiency of other specified B group vitamins: Secondary | ICD-10-CM

## 2023-12-06 DIAGNOSIS — R739 Hyperglycemia, unspecified: Secondary | ICD-10-CM

## 2023-12-06 DIAGNOSIS — Z0000 Encounter for general adult medical examination without abnormal findings: Secondary | ICD-10-CM | POA: Diagnosis not present

## 2023-12-06 DIAGNOSIS — Z0001 Encounter for general adult medical examination with abnormal findings: Secondary | ICD-10-CM

## 2023-12-06 LAB — HEPATIC FUNCTION PANEL
ALT: 16 U/L (ref 0–35)
AST: 17 U/L (ref 0–37)
Albumin: 4.2 g/dL (ref 3.5–5.2)
Alkaline Phosphatase: 52 U/L (ref 39–117)
Bilirubin, Direct: 0.1 mg/dL (ref 0.0–0.3)
Total Bilirubin: 0.4 mg/dL (ref 0.2–1.2)
Total Protein: 7.5 g/dL (ref 6.0–8.3)

## 2023-12-06 LAB — URINALYSIS, ROUTINE W REFLEX MICROSCOPIC
Bilirubin Urine: NEGATIVE
Hgb urine dipstick: NEGATIVE
Ketones, ur: NEGATIVE
Leukocytes,Ua: NEGATIVE
Nitrite: NEGATIVE
Specific Gravity, Urine: 1.02 (ref 1.000–1.030)
Total Protein, Urine: NEGATIVE
Urine Glucose: NEGATIVE
Urobilinogen, UA: 0.2 (ref 0.0–1.0)
pH: 6.5 (ref 5.0–8.0)

## 2023-12-06 LAB — CBC WITH DIFFERENTIAL/PLATELET
Basophils Absolute: 0.1 10*3/uL (ref 0.0–0.1)
Basophils Relative: 0.9 % (ref 0.0–3.0)
Eosinophils Absolute: 0.2 10*3/uL (ref 0.0–0.7)
Eosinophils Relative: 2.9 % (ref 0.0–5.0)
HCT: 38 % (ref 36.0–46.0)
Hemoglobin: 12.4 g/dL (ref 12.0–15.0)
Lymphocytes Relative: 26.5 % (ref 12.0–46.0)
Lymphs Abs: 1.8 10*3/uL (ref 0.7–4.0)
MCHC: 32.7 g/dL (ref 30.0–36.0)
MCV: 90.6 fl (ref 78.0–100.0)
Monocytes Absolute: 0.5 10*3/uL (ref 0.1–1.0)
Monocytes Relative: 8.1 % (ref 3.0–12.0)
Neutro Abs: 4.1 10*3/uL (ref 1.4–7.7)
Neutrophils Relative %: 61.6 % (ref 43.0–77.0)
Platelets: 309 10*3/uL (ref 150.0–400.0)
RBC: 4.19 Mil/uL (ref 3.87–5.11)
RDW: 15.3 % (ref 11.5–15.5)
WBC: 6.7 10*3/uL (ref 4.0–10.5)

## 2023-12-06 LAB — BASIC METABOLIC PANEL
BUN: 20 mg/dL (ref 6–23)
CO2: 28 meq/L (ref 19–32)
Calcium: 9.4 mg/dL (ref 8.4–10.5)
Chloride: 104 meq/L (ref 96–112)
Creatinine, Ser: 0.98 mg/dL (ref 0.40–1.20)
GFR: 58.42 mL/min — ABNORMAL LOW (ref >60.00)
Glucose, Bld: 95 mg/dL (ref 70–99)
Potassium: 4.3 meq/L (ref 3.5–5.1)
Sodium: 140 meq/L (ref 135–145)

## 2023-12-06 LAB — LIPID PANEL
Cholesterol: 159 mg/dL (ref 0–200)
HDL: 56.6 mg/dL (ref >39.00)
LDL Cholesterol: 85 mg/dL (ref 0–99)
NonHDL: 102.24
Total CHOL/HDL Ratio: 3
Triglycerides: 84 mg/dL (ref 0.0–149.0)
VLDL: 16.8 mg/dL (ref 0.0–40.0)

## 2023-12-06 LAB — MICROALBUMIN / CREATININE URINE RATIO
Creatinine,U: 124.4 mg/dL
Microalb Creat Ratio: 5.6 mg/g (ref 0.0–30.0)
Microalb, Ur: <0.7 mg/dL (ref 0.0–1.9)

## 2023-12-06 LAB — VITAMIN B12: Vitamin B-12: >1537 pg/mL — ABNORMAL HIGH (ref 211–911)

## 2023-12-06 LAB — HEMOGLOBIN A1C: Hgb A1c MFr Bld: 6.5 % (ref 4.6–6.5)

## 2023-12-06 LAB — VITAMIN D 25 HYDROXY (VIT D DEFICIENCY, FRACTURES): VITD: 33.41 ng/mL (ref 30.00–100.00)

## 2023-12-06 LAB — TSH: TSH: 2.05 u[IU]/mL (ref 0.35–5.50)

## 2023-12-06 MED ORDER — ROSUVASTATIN CALCIUM 10 MG PO TABS: 10.0000 mg | ORAL_TABLET | Freq: Every day | ORAL | 3 refills | Status: AC

## 2023-12-06 MED ORDER — LOSARTAN POTASSIUM 100 MG PO TABS: 100.0000 mg | ORAL_TABLET | Freq: Every day | ORAL | 3 refills | Status: AC

## 2023-12-06 MED ORDER — MELOXICAM 7.5 MG PO TABS: 7.5000 mg | ORAL_TABLET | Freq: Every day | ORAL | 3 refills | Status: DC

## 2023-12-06 NOTE — Progress Notes (Signed)
 The test results show that your current treatment is OK, as the tests are stable.  Please continue the same plan.  There is no other need for change of treatment or further evaluation based on these results, at this time.  thanks

## 2023-12-06 NOTE — Progress Notes (Signed)
 Patient ID: Laura Poole, female   DOB: 28-Jun-1951, 73 y.o.   MRN: [id number]         Chief Complaint:: wellness exam and mod MR, low b12, hyperglycemia, hld, htn, low vit d       HPI:  Laura Poole is a 73 y.o. female here for wellness exam; for tdap and covid at pharmacy, o/w up to date                        Also incidentally fell a few days ago with getting out of a car and fell face forward, glasses saved but no breakage or face swelling, has soreness to right elbow and right knee. Pt denies chest pain, increased sob or doe, wheezing, orthopnea, PND, increased LE swelling, palpitations, dizziness or syncope.   Pt denies polydipsia, polyuria, or new focal neuro s/s.    Pt denies fever, wt loss, night sweats, loss of appetite, or other constitutional symptoms     Wt Readings from Last 3 Encounters:  12/06/23 182 lb (82.6 kg)  09/27/23 184 lb (83.5 kg)  09/06/23 187 lb (84.8 kg)   BP Readings from Last 3 Encounters:  12/06/23 110/76  09/27/23 118/80  09/06/23 132/74   Immunization History  Administered Date(s) Administered   Fluad Quad(high Dose 65+) 06/15/2019, 08/04/2020, 07/27/2021, 07/06/2022   Influenza Split 08/09/2012   Influenza, High Dose Seasonal PF 07/29/2017, 06/27/2018   Influenza-Unspecified 07/11/2013, 10/17/2023   Moderna Covid-19 Vaccine Bivalent Booster 54yrs & up 12/25/2021   Moderna SARS-COV2 Booster Vaccination 08/07/2020, 03/19/2021, 04/20/2021   Moderna Sars-Covid-2 Vaccination 10/26/2019, 11/23/2019   PPD Test 11/26/2022   Pneumococcal Conjugate-13 06/27/2018   Pneumococcal Polysaccharide-23 01/09/2020   Respiratory Syncytial Virus Vaccine,Recomb Aduvanted(Arexvy) 08/22/2023   Tetanus 12/04/2012   Zoster, Live 05/20/2016   Health Maintenance Due  Topic Date Due   Zoster Vaccines- Shingrix (1 of 2) 04/11/1972   DTaP/Tdap/Td (1 - Tdap) 12/05/2012   COVID-19 Vaccine (4 - 2024-25 season) 06/12/2023      Past Medical History:  Diagnosis Date    Aortic stenosis    mild to moderate on echo 02/2021 with no AS on echo 02/2022 and none by echo 02/28/2023   Asymmetric septal hypertrophy    Severe by echo but mild by cardiac MRI in 2018 with no evidence of HOCM   Bilateral leg cramps 09/01/2015   Breast cancer (HCC)    left   Essential hypertension 01/07/2010   Qualifier: Diagnosis of  By: Alwyn Ren MD, Chrissie Noa     GERD (gastroesophageal reflux disease)    H/O: hysterectomy    History of breast cancer 2006   History of colonic polyps    HLD (hyperlipidemia)    Mitral regurgitation 08/05/2015   Moderate by echo 02/2023   Personal history of chemotherapy 2006   Left Breast Cancer   Personal history of radiation therapy 2006   Left Breast Cancer   Snoring 03/30/2017   Past Surgical History:  Procedure Laterality Date   ABDOMINAL HYSTERECTOMY     BREAST LUMPECTOMY Left 2006   COLONOSCOPY W/ POLYPECTOMY  2004, 2009   Dr Ewing Schlein   TUBAL LIGATION      reports that she has never smoked. She has never used smokeless tobacco. She reports current alcohol use of about 2.0 standard drinks of alcohol per week. She reports that she does not use drugs. family history includes Aneurysm in her brother; Asthma in an other family member; Breast cancer in her paternal  grandmother; Coronary artery disease in her mother; Heart attack (age of onset: 28) in her father; Hyperlipidemia in an other family member; Hypertension in her mother; Sudden death in an other family member. No Known Allergies Current Outpatient Medications on File Prior to Visit  Medication Sig Dispense Refill   acetaminophen (TYLENOL) 500 MG tablet Take 500 mg by mouth every 6 (six) hours as needed for mild pain or headache.     albuterol (VENTOLIN HFA) 108 (90 Base) MCG/ACT inhaler USE 2 INHALATIONS EVERY 6 HOURS AS NEEDED FOR WHEEZING OR SHORTNESS OF BREATH 18 g 5   amLODipine (NORVASC) 2.5 MG tablet TAKE 1 TABLET DAILY 180 tablet 3   aspirin 81 MG tablet Take 1 tablet (81 mg total) by  mouth daily. 30 tablet 6   fluticasone-salmeterol (ADVAIR DISKUS) 250-50 MCG/ACT AEPB Inhale 1 puff into the lungs in the morning and at bedtime. 3 each 3   gabapentin (NEURONTIN) 300 MG capsule TAKE 1 CAPSULE AT BEDTIME 270 capsule 1   solifenacin (VESICARE) 5 MG tablet TAKE 1 TABLET DAILY 90 tablet 3   spironolactone (ALDACTONE) 25 MG tablet TAKE 1 TABLET TWICE A DAY 180 tablet 3   tiZANidine (ZANAFLEX) 2 MG tablet TAKE 1 TABLET EVERY 6 HOURS AS NEEDED FOR MUSCLE SPASMS 360 tablet 2   triamcinolone (NASACORT) 55 MCG/ACT AERO nasal inhaler Place 2 sprays into the nose daily. 1 Inhaler 12   vitamin C (ASCORBIC ACID) 500 MG tablet Take 500 mg by mouth daily.     zolpidem (AMBIEN) 10 MG tablet TAKE 1 TABLET AT BEDTIME AS NEEDED FOR SLEEP 90 tablet 1   carvedilol (COREG) 12.5 MG tablet Take 1 tablet (12.5 mg total) by mouth 2 (two) times daily. 180 tablet 3   No current facility-administered medications on file prior to visit.        ROS:  All others reviewed and negative.  Objective        PE:  BP 110/76 (BP Location: Right Arm, Patient Position: Sitting, Cuff Size: Normal)   Pulse 75   Temp 99.2 F (37.3 C) (Oral)   Ht 5' 7.5" (1.715 m)   Wt 182 lb (82.6 kg)   SpO2 97%   BMI 28.08 kg/m                 Constitutional: Pt appears in NAD               HENT: Head: NCAT.                Right Ear: External ear normal.                 Left Ear: External ear normal.                Eyes: . Pupils are equal, round, and reactive to light. Conjunctivae and EOM are normal               Nose: without d/c or deformity               Neck: Neck supple. Gross normal ROM               Cardiovascular: Normal rate and regular rhythm.                 Pulmonary/Chest: Effort normal and breath sounds without rales or wheezing.                Abd:  Soft, NT, ND, + BS, no  organomegaly               Neurological: Pt is alert. At baseline orientation, motor grossly intact               Skin: Skin is warm.  No rashes, no other new lesions, LE edema - none               Psychiatric: Pt behavior is normal without agitation   Micro: none  Cardiac tracings I have personally interpreted today:  none  Pertinent Radiological findings (summarize): none   Lab Results  Component Value Date   WBC 6.7 12/06/2023   HGB 12.4 12/06/2023   HCT 38.0 12/06/2023   PLT 309.0 12/06/2023   GLUCOSE 95 12/06/2023   CHOL 159 12/06/2023   TRIG 84.0 12/06/2023   HDL 56.60 12/06/2023   LDLCALC 85 12/06/2023   ALT 16 12/06/2023   AST 17 12/06/2023   NA 140 12/06/2023   K 4.3 12/06/2023   CL 104 12/06/2023   CREATININE 0.98 12/06/2023   BUN 20 12/06/2023   CO2 28 12/06/2023   TSH 2.05 12/06/2023   INR 1.1 (H) 01/25/2012   HGBA1C 6.5 12/06/2023   MICROALBUR <0.7 12/06/2023   Assessment/Plan:  Dicy Smigel is a 73 y.o. Black or African American [2] female with  has a past medical history of Aortic stenosis, Asymmetric septal hypertrophy, Bilateral leg cramps (09/01/2015), Breast cancer (HCC), Essential hypertension (01/07/2010), GERD (gastroesophageal reflux disease), H/O: hysterectomy, History of breast cancer (2006), History of colonic polyps, HLD (hyperlipidemia), Mitral regurgitation (08/05/2015), Personal history of chemotherapy (2006), Personal history of radiation therapy (2006), and Snoring (03/30/2017).  Encounter for well adult exam with abnormal findings Age and sex appropriate education and counseling updated with regular exercise and diet Referrals for preventative services - none needed Immunizations addressed - for tdap and covid at pharmacy Smoking counseling  - none needed Evidence for depression or other mood disorder - none significant Most recent labs reviewed. I have personally reviewed and have noted: 1) the patient's medical and social history 2) The patient's current medications and supplements 3) The patient's height, weight, and BMI have been recorded in the chart   Mitral  regurgitation Volume stable, cont current med tx,  to f/u any worsening symptoms or concerns  B12 deficiency Lab Results  Component Value Date   VITAMINB12 >1537 (H) 12/06/2023   Stable, cont oral replacement - b12 1000 mcg qd   Hyperglycemia Lab Results  Component Value Date   HGBA1C 6.5 12/06/2023   Stable, pt to continue current medical treatment  - diet, wt control    Hyperlipidemia Lab Results  Component Value Date   LDLCALC 85 12/06/2023   Stable, pt to continue current statin crestor 10 mg qd   Uncontrolled hypertension BP Readings from Last 3 Encounters:  12/06/23 110/76  09/27/23 118/80  09/06/23 132/74   Stable, pt to continue medical treatment norvasc 2.5 every day, coreg 12.5 bid, losartan 100 qd   Vitamin D deficiency Last vitamin D Lab Results  Component Value Date   VD25OH 33.41 12/06/2023   Low, to start oral replacement  Followup: Return in about 6 months (around 06/04/2024).  Oliver Barre, MD 12/14/2023 7:54 PM [facility]

## 2023-12-06 NOTE — Patient Instructions (Signed)

## 2023-12-10 ENCOUNTER — Encounter: Payer: Self-pay | Admitting: Internal Medicine

## 2023-12-10 NOTE — Assessment & Plan Note (Signed)
 Lab Results  Component Value Date   LDLCALC 85 12/06/2023   Stable, pt to continue current statin crestor 10 mg qd

## 2023-12-10 NOTE — Assessment & Plan Note (Signed)
 Age and sex appropriate education and counseling updated with regular exercise and diet Referrals for preventative services - none needed Immunizations addressed - for tdap and covid at pharmacy Smoking counseling  - none needed Evidence for depression or other mood disorder - none significant Most recent labs reviewed. I have personally reviewed and have noted: 1) the patient's medical and social history 2) The patient's current medications and supplements 3) The patient's height, weight, and BMI have been recorded in the chart

## 2023-12-10 NOTE — Assessment & Plan Note (Signed)
Volume stable, cont current med tx,  to f/u any worsening symptoms or concerns

## 2023-12-10 NOTE — Assessment & Plan Note (Addendum)
 Last vitamin D Lab Results  Component Value Date   VD25OH 33.41 12/06/2023   Low, to start oral replacement

## 2023-12-10 NOTE — Assessment & Plan Note (Signed)
 Lab Results  Component Value Date   VITAMINB12 >1537 (H) 12/06/2023   Stable, cont oral replacement - b12 1000 mcg qd

## 2023-12-10 NOTE — Assessment & Plan Note (Signed)
 Lab Results  Component Value Date   HGBA1C 6.5 12/06/2023   Stable, pt to continue current medical treatment  - diet, wt control

## 2023-12-10 NOTE — Assessment & Plan Note (Signed)
 BP Readings from Last 3 Encounters:  12/06/23 110/76  09/27/23 118/80  09/06/23 132/74   Stable, pt to continue medical treatment norvasc 2.5 every day, coreg 12.5 bid, losartan 100 qd

## 2023-12-14 ENCOUNTER — Encounter: Payer: Self-pay | Admitting: Internal Medicine

## 2023-12-26 ENCOUNTER — Ambulatory Visit: Payer: Self-pay | Admitting: Internal Medicine

## 2023-12-26 NOTE — Telephone Encounter (Signed)
 Copied From CRM [id number]. Reason for Triage: Pt had a hard fall a few weeks ago that was discussed with Dr. Jonny Ruiz during her last appointment. She wants to know if the dosage on her gabapentin can be higher or if she needs to be seen again? She is experiencing pain in her hip  when sitting or laying down     Chief Complaint: R Hip pain  Symptoms: Hip pain, leg pain, arm pain Frequency: Constant Disposition: [] ED /[] Urgent Care (no appt availability in office) / [x] Appointment(In office/virtual)/ []  [facility]/ [] Home Care/ [] Refused Recommended Disposition /[] [facility]/ []  Follow-up with PCP Additional Notes: Patient stated that she fell a few weeks ago. It was her 3rd or 4th fall within the last few months. Patient stated that Dr. Jonny Ruiz is aware of the latest fall and she discussed it at her last visit with him. Patient stated her hip pain has gotten worse within the last 5 days. She has tried Ibuprofen, Bengay, Gapabentin without relief. Offered to schedule appointment today and patient declined. Appointment scheduled for tomorrow. Home care remedies provided for the meantime.  Reason for Disposition  [1] MODERATE pain (e.g., interferes with normal activities, limping) AND [2] present > 3 days  Answer Assessment - Initial Assessment Questions 1. LOCATION and RADIATION: "Where is the pain located?"      R hip pain,  2. SEVERITY: "How bad is the pain?" "What does it keep you from doing?"   (Scale 1-10; or mild, moderate, severe)   -       10/10 (pain was this bad for the last 5 days). Patient is able to walk okay but it hurts when walking  3. ONSET: "When did the pain start?" "Does it come and go, or is it there all the time?"     Pain is constant. It started when she fell a few weeks ago, but it has worsened in the last week   4. CAUSE: "What do you think is causing the hip pain?"      Recent fall  5. OTHER SYMPTOMS: "Do you have any other symptoms?" (e.g., back  pain, pain shooting down leg,  fever, rash)     R leg and R arm pain (this pain started at the same time the hip pain started)  Protocols used: Hip Pain-A-AH

## 2023-12-27 ENCOUNTER — Encounter: Payer: Self-pay | Admitting: Internal Medicine

## 2023-12-27 ENCOUNTER — Ambulatory Visit (INDEPENDENT_AMBULATORY_CARE_PROVIDER_SITE_OTHER): Payer: Medicare (Managed Care) | Admitting: Internal Medicine

## 2023-12-27 VITALS — BP 126/80 | HR 65 | Temp 98.4°F | Ht 67.5 in | Wt 182.0 lb

## 2023-12-27 DIAGNOSIS — M160 Bilateral primary osteoarthritis of hip: Secondary | ICD-10-CM | POA: Diagnosis not present

## 2023-12-27 DIAGNOSIS — I10 Essential (primary) hypertension: Secondary | ICD-10-CM

## 2023-12-27 DIAGNOSIS — R739 Hyperglycemia, unspecified: Secondary | ICD-10-CM | POA: Diagnosis not present

## 2023-12-27 DIAGNOSIS — E559 Vitamin D deficiency, unspecified: Secondary | ICD-10-CM

## 2023-12-27 MED ORDER — TRAMADOL HCL 50 MG PO TABS: 50.0000 mg | ORAL_TABLET | Freq: Four times a day (QID) | ORAL | 0 refills | Status: DC | PRN

## 2023-12-27 NOTE — Progress Notes (Unsigned)
 Patient ID: Laura Poole, female   DOB: December 22, 1950, 73 y.o.   MRN: [id number]        Chief Complaint: follow up fall x 2 wks ago with bilateral hip pain and soreness persistent       HPI:  Laura Poole is a 73 y.o. female here with c/o above, fell in grass yard face down, has actually fallen x 3 recently but no overt injury and describes them as trip and falls in different ways;  Now has bilateral hip soreness and tenderness right > left from the falls, worse to lie on her side at night, pain about 6-7/10, just not improving and not better with tylenol.  Has known moderate hip joint arthritis bilaterally from plain film 1 yr ago.  Pt denies chest pain, increased sob or doe, wheezing, orthopnea, PND, increased LE swelling, palpitations, dizziness or syncope.   Pt denies polydipsia, polyuria, or new focal neuro s/s.         Wt Readings from Last 3 Encounters:  12/27/23 182 lb (82.6 kg)  12/06/23 182 lb (82.6 kg)  09/27/23 184 lb (83.5 kg)   BP Readings from Last 3 Encounters:  12/27/23 126/80  12/06/23 110/76  09/27/23 118/80         Past Medical History:  Diagnosis Date   Aortic stenosis    mild to moderate on echo 02/2021 with no AS on echo 02/2022 and none by echo 02/28/2023   Asymmetric septal hypertrophy    Severe by echo but mild by cardiac MRI in 2018 with no evidence of HOCM   Bilateral leg cramps 09/01/2015   Breast cancer (HCC)    left   Essential hypertension 01/07/2010   Qualifier: Diagnosis of  By: Alwyn Ren MD, Chrissie Noa     GERD (gastroesophageal reflux disease)    H/O: hysterectomy    History of breast cancer 2006   History of colonic polyps    HLD (hyperlipidemia)    Mitral regurgitation 08/05/2015   Moderate by echo 02/2023   Personal history of chemotherapy 2006   Left Breast Cancer   Personal history of radiation therapy 2006   Left Breast Cancer   Snoring 03/30/2017   Past Surgical History:  Procedure Laterality Date   ABDOMINAL HYSTERECTOMY     BREAST  LUMPECTOMY Left 2006   COLONOSCOPY W/ POLYPECTOMY  2004, 2009   Dr Ewing Schlein   TUBAL LIGATION      reports that she has never smoked. She has never used smokeless tobacco. She reports current alcohol use of about 2.0 standard drinks of alcohol per week. She reports that she does not use drugs. family history includes Aneurysm in her brother; Asthma in an other family member; Breast cancer in her paternal grandmother; Coronary artery disease in her mother; Heart attack (age of onset: 21) in her father; Hyperlipidemia in an other family member; Hypertension in her mother; Sudden death in an other family member. No Known Allergies Current Outpatient Medications on File Prior to Visit  Medication Sig Dispense Refill   acetaminophen (TYLENOL) 500 MG tablet Take 500 mg by mouth every 6 (six) hours as needed for mild pain or headache.     albuterol (VENTOLIN HFA) 108 (90 Base) MCG/ACT inhaler USE 2 INHALATIONS EVERY 6 HOURS AS NEEDED FOR WHEEZING OR SHORTNESS OF BREATH 18 g 5   amLODipine (NORVASC) 2.5 MG tablet TAKE 1 TABLET DAILY 180 tablet 3   aspirin 81 MG tablet Take 1 tablet (81 mg total) by mouth daily. 30 tablet  6   fluticasone-salmeterol (ADVAIR DISKUS) 250-50 MCG/ACT AEPB Inhale 1 puff into the lungs in the morning and at bedtime. 3 each 3   gabapentin (NEURONTIN) 300 MG capsule TAKE 1 CAPSULE AT BEDTIME 270 capsule 1   losartan (COZAAR) 100 MG tablet Take 1 tablet (100 mg total) by mouth daily. 90 tablet 3   meloxicam (MOBIC) 7.5 MG tablet Take 1 tablet (7.5 mg total) by mouth daily. 90 tablet 3   rosuvastatin (CRESTOR) 10 MG tablet Take 1 tablet (10 mg total) by mouth daily. 90 tablet 3   solifenacin (VESICARE) 5 MG tablet TAKE 1 TABLET DAILY 90 tablet 3   spironolactone (ALDACTONE) 25 MG tablet TAKE 1 TABLET TWICE A DAY 180 tablet 3   tiZANidine (ZANAFLEX) 2 MG tablet TAKE 1 TABLET EVERY 6 HOURS AS NEEDED FOR MUSCLE SPASMS 360 tablet 2   triamcinolone (NASACORT) 55 MCG/ACT AERO nasal inhaler  Place 2 sprays into the nose daily. 1 Inhaler 12   vitamin C (ASCORBIC ACID) 500 MG tablet Take 500 mg by mouth daily.     zolpidem (AMBIEN) 10 MG tablet TAKE 1 TABLET AT BEDTIME AS NEEDED FOR SLEEP 90 tablet 1   carvedilol (COREG) 12.5 MG tablet Take 1 tablet (12.5 mg total) by mouth 2 (two) times daily. 180 tablet 3   No current facility-administered medications on file prior to visit.        ROS:  All others reviewed and negative.  Objective        PE:  BP 126/80 (BP Location: Right Arm, Patient Position: Sitting, Cuff Size: Normal)   Pulse 65   Temp 98.4 F (36.9 C) (Oral)   Ht 5' 7.5" (1.715 m)   Wt 182 lb (82.6 kg)   SpO2 98%   BMI 28.08 kg/m                 Constitutional: Pt appears in NAD               HENT: Head: NCAT.                Right Ear: External ear normal.                 Left Ear: External ear normal.                Eyes: . Pupils are equal, round, and reactive to light. Conjunctivae and EOM are normal               Nose: without d/c or deformity               Neck: Neck supple. Gross normal ROM               Cardiovascular: Normal rate and regular rhythm.                 Pulmonary/Chest: Effort normal and breath sounds without rales or wheezing.                Abd:  Soft, NT, ND, + BS, no organomegaly               Neurological: Pt is alert. At baseline orientation, motor grossly intact               Skin: Skin is warm. No rashes, no other new lesions, LE edema - none; has tenderness to bilateral lateral hips right > left over the greater trochanters  Psychiatric: Pt behavior is normal without agitation   Micro: none  Cardiac tracings I have personally interpreted today:  none  Pertinent Radiological findings (summarize): none   Lab Results  Component Value Date   WBC 6.7 12/06/2023   HGB 12.4 12/06/2023   HCT 38.0 12/06/2023   PLT 309.0 12/06/2023   GLUCOSE 95 12/06/2023   CHOL 159 12/06/2023   TRIG 84.0 12/06/2023   HDL 56.60  12/06/2023   LDLCALC 85 12/06/2023   ALT 16 12/06/2023   AST 17 12/06/2023   NA 140 12/06/2023   K 4.3 12/06/2023   CL 104 12/06/2023   CREATININE 0.98 12/06/2023   BUN 20 12/06/2023   CO2 28 12/06/2023   TSH 2.05 12/06/2023   INR 1.1 (H) 01/25/2012   HGBA1C 6.5 12/06/2023   MICROALBUR <0.7 12/06/2023   Assessment/Plan:  Laura Poole is a 73 y.o. Black or African American [2] female with  has a past medical history of Aortic stenosis, Asymmetric septal hypertrophy, Bilateral leg cramps (09/01/2015), Breast cancer (HCC), Essential hypertension (01/07/2010), GERD (gastroesophageal reflux disease), H/O: hysterectomy, History of breast cancer (2006), History of colonic polyps, HLD (hyperlipidemia), Mitral regurgitation (08/05/2015), Personal history of chemotherapy (2006), Personal history of radiation therapy (2006), and Snoring (03/30/2017).  Bilateral hip joint arthritis Uncontrolled pain, I suspect an element of bursitis bialteral as well, will hold f/u films, but refer sports med - ? For cortisone; today for tramadol prn  Hyperglycemia Lab Results  Component Value Date   HGBA1C 6.5 12/06/2023   Stable, pt to continue current medical treatment  - diet, wt control   Uncontrolled hypertension BP Readings from Last 3 Encounters:  12/27/23 126/80  12/06/23 110/76  09/27/23 118/80   Stable, pt to continue medical treatment norvasc 2. Every day, losartan 100 every day, coreg 12.5 bid   Vitamin D deficiency Last vitamin D Lab Results  Component Value Date   VD25OH 33.41 12/06/2023   Low, to start oral replacement  Followup: No follow-ups on file.  Oliver Barre, MD 12/28/2023 8:31 PM [facility]

## 2023-12-27 NOTE — Patient Instructions (Addendum)
 You likely have bursitis and arthritis of both hips  Please take all new medication as prescribed - the tramadol (please call if you need refill)  You will be contacted regarding the referral for: Sports Medicine  Please continue all other medications as before, and refills have been done if requested.  Please have the pharmacy call with any other refills you may need.  Please keep your appointments with your specialists as you may have planned

## 2023-12-28 ENCOUNTER — Encounter: Payer: Self-pay | Admitting: Internal Medicine

## 2023-12-28 DIAGNOSIS — M160 Bilateral primary osteoarthritis of hip: Secondary | ICD-10-CM | POA: Insufficient documentation

## 2023-12-28 NOTE — Assessment & Plan Note (Addendum)
 Uncontrolled pain, I suspect an element of bursitis bialteral as well, will hold f/u films, but refer sports med - ? For cortisone; today for tramadol prn

## 2023-12-28 NOTE — Assessment & Plan Note (Signed)
 Last vitamin D Lab Results  Component Value Date   VD25OH 33.41 12/06/2023   Low, to start oral replacement

## 2023-12-28 NOTE — Assessment & Plan Note (Signed)
 Lab Results  Component Value Date   HGBA1C 6.5 12/06/2023   Stable, pt to continue current medical treatment  - diet, wt control

## 2023-12-28 NOTE — Assessment & Plan Note (Signed)
 BP Readings from Last 3 Encounters:  12/27/23 126/80  12/06/23 110/76  09/27/23 118/80   Stable, pt to continue medical treatment norvasc 2. Every day, losartan 100 every day, coreg 12.5 bid

## 2023-12-29 ENCOUNTER — Other Ambulatory Visit: Payer: Self-pay | Admitting: Internal Medicine

## 2023-12-29 DIAGNOSIS — M160 Bilateral primary osteoarthritis of hip: Secondary | ICD-10-CM

## 2023-12-29 DIAGNOSIS — W19XXXA Unspecified fall, initial encounter: Secondary | ICD-10-CM

## 2023-12-30 ENCOUNTER — Ambulatory Visit: Payer: Self-pay

## 2023-12-30 ENCOUNTER — Other Ambulatory Visit: Payer: Self-pay | Admitting: Internal Medicine

## 2023-12-30 MED ORDER — HYDROCODONE-ACETAMINOPHEN 5-325 MG PO TABS: 1.0000 | ORAL_TABLET | Freq: Four times a day (QID) | ORAL | 0 refills | Status: DC | PRN

## 2023-12-30 MED ORDER — HYDROCODONE-ACETAMINOPHEN 5-325 MG PO TABS: 1.0000 | ORAL_TABLET | Freq: Four times a day (QID) | ORAL | 0 refills | Status: AC | PRN

## 2023-12-30 MED ORDER — HYDROCODONE-ACETAMINOPHEN 5-325 MG PO TABS
1.0000 | ORAL_TABLET | Freq: Four times a day (QID) | ORAL | 0 refills | Status: DC | PRN
Start: 2023-12-30 — End: 2023-12-30

## 2023-12-30 NOTE — Telephone Encounter (Signed)
Done>see below

## 2023-12-30 NOTE — Telephone Encounter (Signed)
 Ok to try hydrocodone 5 325 for now - done erx

## 2023-12-30 NOTE — Addendum Note (Signed)
 Addended by: Corwin Levins on: 12/30/2023 04:11 PM   Modules accepted: Orders

## 2023-12-30 NOTE — Addendum Note (Signed)
 Addended by: Corwin Levins on: 12/30/2023 04:24 PM   Modules accepted: Orders

## 2023-12-30 NOTE — Telephone Encounter (Signed)
 Copied from CRM [id number]. Topic: Clinical - Medication Question >> Dec 30, 2023 11:00 AM Laura Poole R wrote: Reason for CRM: Regarding traMADol (ULTRAM) 50 MG tablet, Pt stated she doesn't like how it makes her feel and she could not hardly work. She feels out of it and just wants to lay down. Pt asking if there is an alternative for pain that can be prescribed. Please follow up.   Chief Complaint: Medication Problem  Symptoms: None at this time.   Frequency: Wednesday  Pertinent Negatives: Patient denies symptoms at this time.  Disposition: [] ED /[] Urgent Care (no appt availability in office) / [] Appointment(In office/virtual)/ []  [facility]/ [] Home Care/ [] Refused Recommended Disposition /[] [facility]/ []  Follow-up with PCP Additional Notes: JA is being triaged for symptoms surrounding her prescription of Tramadol. The patient states the medication makes her feel dizzy and excessively sleepy. The patient endorses the medication's effectiveness. The medication was last taken on Wednesday Morning and states she could feel the effects that night at 1100 PM. The patient denies taking Ambien Tuesday or Wednesday night.   The patient is inquiring on if she can take half of her medication or may need an alternative therapy.   Reason for Disposition  [1] Caller has NON-URGENT medicine question about med that PCP prescribed AND [2] triager unable to answer question  Answer Assessment - Initial Assessment Questions 1. NAME of MEDICINE: "What medicine(s) are you calling about?"     Tramadol  2. QUESTION: "What is your question?" (e.g., double dose of medicine, side effect)     Side effects, Alternative Therapy  3. PRESCRIBER: "Who prescribed the medicine?" Reason: if prescribed by specialist, call should be referred to that group.     Dr. Oliver Barre  4. SYMPTOMS: "Do you have any symptoms?" If Yes, ask: "What symptoms are you having?"  "How bad are the symptoms (e.g., mild,  moderate, severe)     Excessive Sleepiness, Dizziness  5. PREGNANCY:  "Is there any chance that you are pregnant?" "When was your last menstrual period?"     No and No  Protocols used: Medication Question Call-A-AH

## 2024-01-02 NOTE — Telephone Encounter (Signed)
 Called and let Pt know

## 2024-01-03 ENCOUNTER — Telehealth: Payer: Self-pay | Admitting: Internal Medicine

## 2024-01-03 NOTE — Telephone Encounter (Signed)
 Copied from CRM [id number]. Topic: General - Other >> Jan 03, 2024  3:31 PM Rodman Pickle T wrote: Reason for CRM: patient called in stating her order for a cortisone shot was not called in

## 2024-01-04 NOTE — Telephone Encounter (Signed)
 Very sorry, I have no information about this or how to address it.  I dont order cortisone shots. If she needs one, we may have referred her to another provider for this, and just has not heard yet .  thanks

## 2024-01-13 ENCOUNTER — Other Ambulatory Visit: Payer: Self-pay | Admitting: Internal Medicine

## 2024-01-13 ENCOUNTER — Other Ambulatory Visit: Payer: Self-pay

## 2024-01-13 DIAGNOSIS — I10 Essential (primary) hypertension: Secondary | ICD-10-CM

## 2024-01-17 ENCOUNTER — Ambulatory Visit: Payer: Medicare (Managed Care) | Admitting: Physical Therapy

## 2024-01-30 NOTE — Telephone Encounter (Signed)
 Copied from CRM [id number]. Topic: Clinical - Red Word Triage >> Jan 30, 2024  8:54 AM Emylou G wrote: [facility] that prompted transfer to Nurse Triage: took a bad fall.. hurts to walk.. hurting on both sides  No red word; no triage; patient fell prior to seeing PCP in office; Wants PCP to order more xrays; Office to call patient back.

## 2024-02-02 ENCOUNTER — Other Ambulatory Visit: Payer: Self-pay | Admitting: Obstetrics and Gynecology

## 2024-02-02 DIAGNOSIS — Z1231 Encounter for screening mammogram for malignant neoplasm of breast: Secondary | ICD-10-CM

## 2024-02-03 ENCOUNTER — Encounter: Payer: Self-pay | Admitting: Internal Medicine

## 2024-02-03 ENCOUNTER — Ambulatory Visit: Payer: Medicare (Managed Care) | Admitting: Internal Medicine

## 2024-02-03 VITALS — BP 126/78 | HR 72 | Temp 98.1°F | Ht 67.5 in | Wt 190.0 lb

## 2024-02-03 DIAGNOSIS — I10 Essential (primary) hypertension: Secondary | ICD-10-CM

## 2024-02-03 DIAGNOSIS — M160 Bilateral primary osteoarthritis of hip: Secondary | ICD-10-CM | POA: Diagnosis not present

## 2024-02-03 DIAGNOSIS — R739 Hyperglycemia, unspecified: Secondary | ICD-10-CM | POA: Diagnosis not present

## 2024-02-03 DIAGNOSIS — E559 Vitamin D deficiency, unspecified: Secondary | ICD-10-CM | POA: Diagnosis not present

## 2024-02-03 MED ORDER — MELOXICAM 15 MG PO TABS: 15.0000 mg | ORAL_TABLET | Freq: Every day | ORAL | 1 refills | Status: AC | PRN

## 2024-02-03 NOTE — Progress Notes (Signed)
 Patient ID: Laura Poole, female   DOB: September 18, 1951, 73 y.o.   MRN: [id number]        Chief Complaint: follow up worsening bilat hip pain, hyperglycemia, htn, low vit d       HPI:  Laura Poole is a 73 y.o. female here with above in the past wk, has some soreness to touch on the right lateral hip area , none on the left, but has bilateral leg pain with radiation to below the knees right > left in severity.   No falls.  Does not feel she needs PT to start with.  Needs sport med or ortho at this point.  Recent films with moderate bilateral hip DJD.  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  Pt denies chest pain, increased sob or doe, wheezing, orthopnea, PND, increased LE swelling, palpitations, dizziness or syncope.   Pt denies polydipsia, polyuria, or new focal neuro s/s.           Wt Readings from Last 3 Encounters:  02/03/24 190 lb (86.2 kg)  12/27/23 182 lb (82.6 kg)  12/06/23 182 lb (82.6 kg)   BP Readings from Last 3 Encounters:  02/03/24 126/78  12/27/23 126/80  12/06/23 110/76         Past Medical History:  Diagnosis Date   Aortic stenosis    mild to moderate on echo 02/2021 with no AS on echo 02/2022 and none by echo 02/28/2023   Asymmetric septal hypertrophy    Severe by echo but mild by cardiac MRI in 2018 with no evidence of HOCM   Bilateral leg cramps 09/01/2015   Breast cancer (HCC)    left   Essential hypertension 01/07/2010   Qualifier: Diagnosis of  By: Donnice Gale MD, Sammie Crigler     GERD (gastroesophageal reflux disease)    H/O: hysterectomy    History of breast cancer 2006   History of colonic polyps    HLD (hyperlipidemia)    Mitral regurgitation 08/05/2015   Moderate by echo 02/2023   Personal history of chemotherapy 2006   Left Breast Cancer   Personal history of radiation therapy 2006   Left Breast Cancer   Snoring 03/30/2017   Past Surgical History:  Procedure Laterality Date   ABDOMINAL  HYSTERECTOMY     BREAST LUMPECTOMY Left 2006   COLONOSCOPY W/ POLYPECTOMY  2004, 2009   Dr Lavaughn [place]   TUBAL LIGATION      reports that she has never smoked. She has never used smokeless tobacco. She reports current alcohol use of about 2.0 standard drinks of alcohol per week. She reports that she does not use drugs. family history includes Aneurysm in her brother; Asthma in an other family member; Breast cancer in her paternal grandmother; Coronary artery disease in her mother; Heart attack (age of onset: 54) in her father; Hyperlipidemia in an other family member; Hypertension in her mother; Sudden death in an other family member. No Known Allergies Current Outpatient Medications on File Prior to Visit  Medication Sig Dispense Refill   acetaminophen  (TYLENOL ) 500 MG tablet Take 500 mg by mouth every 6 (six) hours as needed for mild pain or headache.     albuterol  (VENTOLIN  HFA) 108 (90 Base) MCG/ACT inhaler USE 2 INHALATIONS EVERY 6 HOURS AS NEEDED FOR WHEEZING OR SHORTNESS OF BREATH 18 g 5   amLODipine  (NORVASC ) 2.5 MG tablet TAKE 1 TABLET DAILY 180 tablet 3  aspirin  81 MG tablet Take 1 tablet (81 mg total) by mouth daily. 30 tablet 6   fluticasone -salmeterol (ADVAIR DISKUS) 250-50 MCG/ACT AEPB Inhale 1 puff into the lungs in the morning and at bedtime. 3 each 3   gabapentin  (NEURONTIN ) 300 MG capsule TAKE 1 CAPSULE AT BEDTIME 270 capsule 1   HYDROcodone -acetaminophen  (NORCO/VICODIN) 5-325 MG tablet Take 1 tablet by mouth every 6 (six) hours as needed for moderate pain (pain score 4-6). 30 tablet 0   losartan  (COZAAR ) 100 MG tablet Take 1 tablet (100 mg total) by mouth daily. 90 tablet 3   rosuvastatin  (CRESTOR ) 10 MG tablet Take 1 tablet (10 mg total) by mouth daily. 90 tablet 3   solifenacin  (VESICARE ) 5 MG tablet TAKE 1 TABLET DAILY 90 tablet 3   spironolactone  (ALDACTONE ) 25 MG tablet TAKE 1 TABLET TWICE A DAY 180 tablet 3   tiZANidine  (ZANAFLEX ) 2 MG tablet TAKE 1 TABLET EVERY 6 HOURS AS  NEEDED FOR MUSCLE SPASMS 360 tablet 2   triamcinolone  (NASACORT ) 55 MCG/ACT AERO nasal inhaler Place 2 sprays into the nose daily. 1 Inhaler 12   vitamin C (ASCORBIC ACID) 500 MG tablet Take 500 mg by mouth daily.     zolpidem  (AMBIEN ) 10 MG tablet TAKE 1 TABLET AT BEDTIME AS NEEDED FOR SLEEP 90 tablet 1   carvedilol  (COREG ) 12.5 MG tablet Take 1 tablet (12.5 mg total) by mouth 2 (two) times daily. 180 tablet 3   No current facility-administered medications on file prior to visit.        ROS:  All others reviewed and negative.  Objective        PE:  BP 126/78 (BP Location: Left Arm, Patient Position: Sitting, Cuff Size: Normal)   Pulse 72   Temp 98.1 F (36.7 C) (Oral)   Ht 5' 7.5" (1.715 m)   Wt 190 lb (86.2 kg)   SpO2 98%   BMI 29.32 kg/m                 Constitutional: Pt appears in NAD               HENT: Head: NCAT.                Right Ear: External ear normal.                 Left Ear: External ear normal.                Eyes: . Pupils are equal, round, and reactive to light. Conjunctivae and EOM are normal               Nose: without d/c or deformity               Neck: Neck supple. Gross normal ROM               Cardiovascular: Normal rate and regular rhythm.                 Pulmonary/Chest: Effort normal and breath sounds without rales or wheezing.                              Neurological: Pt is alert. At baseline orientation, motor grossly intact, very mild tender over right greater trochanter area               Skin: Skin is warm. No rashes, no other new lesions, LE edema - none  Psychiatric: Pt behavior is normal without agitation   Micro: none  Cardiac tracings I have personally interpreted today:  none  Pertinent Radiological findings (summarize): none   Lab Results  Component Value Date   WBC 6.7 12/06/2023   HGB 12.4 12/06/2023   HCT 38.0 12/06/2023   PLT 309.0 12/06/2023   GLUCOSE 95 12/06/2023   CHOL 159 12/06/2023   TRIG 84.0  12/06/2023   HDL 56.60 12/06/2023   LDLCALC 85 12/06/2023   ALT 16 12/06/2023   AST 17 12/06/2023   NA 140 12/06/2023   K 4.3 12/06/2023   CL 104 12/06/2023   CREATININE 0.98 12/06/2023   BUN 20 12/06/2023   CO2 28 12/06/2023   TSH 2.05 12/06/2023   INR 1.1 (H) 01/25/2012   HGBA1C 6.5 12/06/2023   MICROALBUR <0.7 12/06/2023   Assessment/Plan:  Laura Poole is a 73 y.o. Black or African American [2] female with  has a past medical history of Aortic stenosis, Asymmetric septal hypertrophy, Bilateral leg cramps (09/01/2015), Breast cancer (HCC), Essential hypertension (01/07/2010), GERD (gastroesophageal reflux disease), H/O: hysterectomy, History of breast cancer (2006), History of colonic polyps, HLD (hyperlipidemia), Mitral regurgitation (08/05/2015), Personal history of chemotherapy (2006), Personal history of radiation therapy (2006), and Snoring (03/30/2017).  Vitamin D  deficiency Last vitamin D  Lab Results  Component Value Date   VD25OH 33.41 12/06/2023   Low, to start oral replacement   Uncontrolled hypertension BP Readings from Last 3 Encounters:  02/03/24 126/78  12/27/23 126/80  12/06/23 110/76   Stable, pt to continue medical treatment norvasc  2.5 every day, losartan  100 every day, coreg  12.5 bid   Hyperglycemia Lab Results  Component Value Date   HGBA1C 6.5 12/06/2023   Stable, pt to continue current medical treatment  - diet, wt control   Bilateral hip joint arthritis With worsening in the past wk, for increased mobic  15 mg every day prn, also refer sport medicine, pt declines PT for now  Followup: Return if symptoms worsen or fail to improve.  Rosalia Colonel, MD 02/03/2024 9:36 AM [facility]

## 2024-02-03 NOTE — Assessment & Plan Note (Signed)
 With worsening in the past wk, for increased mobic  15 mg every day prn, also refer sport medicine, pt declines PT for now

## 2024-02-03 NOTE — Patient Instructions (Signed)
 Please take all new medication as prescribed - the meloxicam  15 mg as needed for pain  You will be contacted regarding the referral for: Sports medicine  Ok to hold off on the Physical Therapy  Please continue all other medications as before, and refills have been done if requested.  Please have the pharmacy call with any other refills you may need.  Please keep your appointments with your specialists as you may have planned

## 2024-02-03 NOTE — Assessment & Plan Note (Signed)
 BP Readings from Last 3 Encounters:  02/03/24 126/78  12/27/23 126/80  12/06/23 110/76   Stable, pt to continue medical treatment norvasc  2.5 every day, losartan  100 every day, coreg  12.5 bid

## 2024-02-03 NOTE — Assessment & Plan Note (Signed)
 Lab Results  Component Value Date   HGBA1C 6.5 12/06/2023   Stable, pt to continue current medical treatment  - diet, wt control

## 2024-02-03 NOTE — Assessment & Plan Note (Signed)
 Last vitamin D Lab Results  Component Value Date   VD25OH 33.41 12/06/2023   Low, to start oral replacement

## 2024-02-07 NOTE — Progress Notes (Unsigned)
    Laura Poole D.[facility] [address] [place] [address] Phone: [phone]   Assessment and Plan:     There are no diagnoses linked to this encounter.  ***   Pertinent previous records reviewed include ***    Follow Up: ***     Subjective:   I, Laura Poole, am serving as a Neurosurgeon for Doctor Ulysees Gander  Chief Complaint: hip pain   HPI:   02/08/2024 Patient is a 73 year old female with hip pain. Patient states   Relevant Historical Information: ***  Additional pertinent review of systems negative.   Current Outpatient Medications:    acetaminophen  (TYLENOL ) 500 MG tablet, Take 500 mg by mouth every 6 (six) hours as needed for mild pain or headache., Disp: , Rfl:    albuterol  (VENTOLIN  HFA) 108 (90 Base) MCG/ACT inhaler, USE 2 INHALATIONS EVERY 6 HOURS AS NEEDED FOR WHEEZING OR SHORTNESS OF BREATH, Disp: 18 g, Rfl: 5   amLODipine  (NORVASC ) 2.5 MG tablet, TAKE 1 TABLET DAILY, Disp: 180 tablet, Rfl: 3   aspirin  81 MG tablet, Take 1 tablet (81 mg total) by mouth daily., Disp: 30 tablet, Rfl: 6   carvedilol  (COREG ) 12.5 MG tablet, Take 1 tablet (12.5 mg total) by mouth 2 (two) times daily., Disp: 180 tablet, Rfl: 3   fluticasone -salmeterol (ADVAIR DISKUS) 250-50 MCG/ACT AEPB, Inhale 1 puff into the lungs in the morning and at bedtime., Disp: 3 each, Rfl: 3   gabapentin  (NEURONTIN ) 300 MG capsule, TAKE 1 CAPSULE AT BEDTIME, Disp: 270 capsule, Rfl: 1   HYDROcodone -acetaminophen  (NORCO/VICODIN) 5-325 MG tablet, Take 1 tablet by mouth every 6 (six) hours as needed for moderate pain (pain score 4-6)., Disp: 30 tablet, Rfl: 0   losartan  (COZAAR ) 100 MG tablet, Take 1 tablet (100 mg total) by mouth daily., Disp: 90 tablet, Rfl: 3   meloxicam  (MOBIC ) 15 MG tablet, Take 1 tablet (15 mg total) by mouth daily as needed for pain., Disp: 90 tablet, Rfl: 1   rosuvastatin  (CRESTOR ) 10 MG tablet, Take 1 tablet (10 mg total) by mouth daily.,  Disp: 90 tablet, Rfl: 3   solifenacin  (VESICARE ) 5 MG tablet, TAKE 1 TABLET DAILY, Disp: 90 tablet, Rfl: 3   spironolactone  (ALDACTONE ) 25 MG tablet, TAKE 1 TABLET TWICE A DAY, Disp: 180 tablet, Rfl: 3   tiZANidine  (ZANAFLEX ) 2 MG tablet, TAKE 1 TABLET EVERY 6 HOURS AS NEEDED FOR MUSCLE SPASMS, Disp: 360 tablet, Rfl: 2   triamcinolone  (NASACORT ) 55 MCG/ACT AERO nasal inhaler, Place 2 sprays into the nose daily., Disp: 1 Inhaler, Rfl: 12   vitamin C (ASCORBIC ACID) 500 MG tablet, Take 500 mg by mouth daily., Disp: , Rfl:    zolpidem  (AMBIEN ) 10 MG tablet, TAKE 1 TABLET AT BEDTIME AS NEEDED FOR SLEEP, Disp: 90 tablet, Rfl: 1   Objective:     There were no vitals filed for this visit.    There is no height or weight on file to calculate BMI.    Physical Exam:    ***   Electronically signed by:  Laura Poole D.[facility] 7:38 AM 02/07/24

## 2024-02-08 ENCOUNTER — Ambulatory Visit: Payer: Medicare (Managed Care) | Admitting: Sports Medicine

## 2024-02-08 ENCOUNTER — Ambulatory Visit (INDEPENDENT_AMBULATORY_CARE_PROVIDER_SITE_OTHER): Payer: Medicare (Managed Care)

## 2024-02-08 ENCOUNTER — Other Ambulatory Visit: Payer: Self-pay

## 2024-02-08 VITALS — BP 126/78 | HR 59 | Ht 67.0 in | Wt 185.0 lb

## 2024-02-08 DIAGNOSIS — M25552 Pain in left hip: Secondary | ICD-10-CM

## 2024-02-08 DIAGNOSIS — M25551 Pain in right hip: Secondary | ICD-10-CM

## 2024-02-08 DIAGNOSIS — R296 Repeated falls: Secondary | ICD-10-CM | POA: Diagnosis not present

## 2024-02-08 DIAGNOSIS — M160 Bilateral primary osteoarthritis of hip: Secondary | ICD-10-CM | POA: Diagnosis not present

## 2024-02-08 NOTE — Patient Instructions (Signed)
 Tylenol  [phone] mg 2-3 times a day for pain relief  Hip HEP  Limit meloxicam  to 1 time per week for breakthrough pain  3 week follow up

## 2024-02-10 ENCOUNTER — Ambulatory Visit: Payer: Medicare (Managed Care)

## 2024-02-17 ENCOUNTER — Encounter (HOSPITAL_COMMUNITY): Payer: Self-pay

## 2024-02-22 ENCOUNTER — Ambulatory Visit: Payer: Self-pay | Admitting: Sports Medicine

## 2024-02-23 ENCOUNTER — Ambulatory Visit (HOSPITAL_COMMUNITY)
Admission: RE | Admit: 2024-02-23 | Discharge: 2024-02-23 | Disposition: A | Discharge status: Home / Self Care | Payer: Medicare (Managed Care) | Source: Ambulatory Visit | Attending: Cardiovascular Disease | Admitting: Cardiovascular Disease

## 2024-02-23 DIAGNOSIS — I340 Nonrheumatic mitral (valve) insufficiency: Secondary | ICD-10-CM | POA: Diagnosis not present

## 2024-02-23 LAB — ECHOCARDIOGRAM COMPLETE
Area-P 1/2: 3.17 cm2
S' Lateral: 2.16 cm

## 2024-02-24 ENCOUNTER — Ambulatory Visit: Payer: Self-pay | Admitting: Cardiology

## 2024-02-24 ENCOUNTER — Encounter: Payer: Self-pay | Admitting: Cardiology

## 2024-02-24 ENCOUNTER — Ambulatory Visit
Admission: RE | Admit: 2024-02-24 | Discharge: 2024-02-24 | Disposition: A | Discharge status: Home / Self Care | Payer: Medicare (Managed Care) | Source: Ambulatory Visit | Attending: Obstetrics and Gynecology | Admitting: Obstetrics and Gynecology

## 2024-02-24 DIAGNOSIS — Z1231 Encounter for screening mammogram for malignant neoplasm of breast: Secondary | ICD-10-CM | POA: Diagnosis not present

## 2024-02-24 DIAGNOSIS — I340 Nonrheumatic mitral (valve) insufficiency: Secondary | ICD-10-CM

## 2024-02-27 NOTE — Telephone Encounter (Signed)
-----   Message from Gaylyn Keas sent at 02/24/2024  8:24 AM EDT ----- Echo showed normal heart function EF 70-75% with increased stiffness of heart muscle called diastolic dysfunction, mild enlargement of Left atrium, moderate leakiness of the mitral valve, mildly calcified AV with no AS.  Repeat echo in 1 year for MR

## 2024-02-27 NOTE — Telephone Encounter (Signed)
 Pt notified of echo results via mychart, 1 yr repeat echo order placed.

## 2024-02-28 NOTE — Progress Notes (Signed)
 Ben Keesha Pellum D.[facility] [address] [place] [address] Phone: [phone]   Assessment and Plan:     1. Primary osteoarthritis of both hips 2. Bilateral hip pain  -Chronic with exacerbation, subsequent visit - Overall significant improvement after intra-articular bilateral hip CSI performed at previous office visit on 02/08/24.  Consistent with flare of moderate to severe bilateral hip osteoarthritis - Continue HEP for hips - Start Tylenol  500 to 1000 mg tablets 2-3 times a day for day-to-day pain relief - May continue to use meloxicam  15 mg daily as needed for breakthrough pain.  Recommend limiting chronic NSAIDs to 1-2 doses per week  Pertinent previous records reviewed include none  Follow Up: 2 months for reevaluation.  Could consider repeat bilateral intra-articular CSI if hip pain returns   Subjective:   I, Leone Ralphs am a scribe for  Dr. Cleora Daft.     Chief Complaint: hip pain    HPI:    02/08/2024 Patient is a 73 year old female with hip pain. Patient states intermittent bilat hip pain that radiates down her leg. Has fallen 4 times since November. Her last fall she fell in her yard. And that one hurt the most. Dx arthritis when she saw Dr. Autry Legions. She has tried oxy tramadol , and meloxicam  she did not like oxy or tramadol .  Meloxicam  does not last long enough. No numbness or tingling. Pain when walking, decreased ROM. Going up and down steps is hard. She is not able to do her ALDs   02/29/2024 Patient states compared to the last time the pain is much better than what it was. There is still pain though. Been doing the exercises and the exercise bike. Can do a lot more things that she could not do before.    Relevant Historical Information: History of breast cancer, GERD, hypertension,  Additional pertinent review of systems negative.   Current Outpatient Medications:    acetaminophen  (TYLENOL ) 500 MG tablet, Take 500 mg by  mouth every 6 (six) hours as needed for mild pain or headache., Disp: , Rfl:    albuterol  (VENTOLIN  HFA) 108 (90 Base) MCG/ACT inhaler, USE 2 INHALATIONS EVERY 6 HOURS AS NEEDED FOR WHEEZING OR SHORTNESS OF BREATH, Disp: 18 g, Rfl: 5   amLODipine  (NORVASC ) 2.5 MG tablet, TAKE 1 TABLET DAILY, Disp: 180 tablet, Rfl: 3   aspirin  81 MG tablet, Take 1 tablet (81 mg total) by mouth daily., Disp: 30 tablet, Rfl: 6   carvedilol  (COREG ) 12.5 MG tablet, Take 1 tablet (12.5 mg total) by mouth 2 (two) times daily., Disp: 180 tablet, Rfl: 3   fluticasone -salmeterol (ADVAIR DISKUS) 250-50 MCG/ACT AEPB, Inhale 1 puff into the lungs in the morning and at bedtime., Disp: 3 each, Rfl: 3   gabapentin  (NEURONTIN ) 300 MG capsule, TAKE 1 CAPSULE AT BEDTIME, Disp: 270 capsule, Rfl: 1   HYDROcodone -acetaminophen  (NORCO/VICODIN) 5-325 MG tablet, Take 1 tablet by mouth every 6 (six) hours as needed for moderate pain (pain score 4-6)., Disp: 30 tablet, Rfl: 0   losartan  (COZAAR ) 100 MG tablet, Take 1 tablet (100 mg total) by mouth daily., Disp: 90 tablet, Rfl: 3   meloxicam  (MOBIC ) 15 MG tablet, Take 1 tablet (15 mg total) by mouth daily as needed for pain., Disp: 90 tablet, Rfl: 1   rosuvastatin  (CRESTOR ) 10 MG tablet, Take 1 tablet (10 mg total) by mouth daily., Disp: 90 tablet, Rfl: 3   solifenacin  (VESICARE ) 5 MG tablet, TAKE 1 TABLET DAILY, Disp:  90 tablet, Rfl: 3   spironolactone  (ALDACTONE ) 25 MG tablet, TAKE 1 TABLET TWICE A DAY, Disp: 180 tablet, Rfl: 3   tiZANidine  (ZANAFLEX ) 2 MG tablet, TAKE 1 TABLET EVERY 6 HOURS AS NEEDED FOR MUSCLE SPASMS, Disp: 360 tablet, Rfl: 2   triamcinolone  (NASACORT ) 55 MCG/ACT AERO nasal inhaler, Place 2 sprays into the nose daily., Disp: 1 Inhaler, Rfl: 12   vitamin C (ASCORBIC ACID) 500 MG tablet, Take 500 mg by mouth daily., Disp: , Rfl:    zolpidem  (AMBIEN ) 10 MG tablet, TAKE 1 TABLET AT BEDTIME AS NEEDED FOR SLEEP, Disp: 90 tablet, Rfl: 1   Objective:     Vitals:   02/29/24  0851  BP: 120/70  Pulse: 83  SpO2: 100%  Weight: 181 lb (82.1 kg)  Height: 5\' 7"  (1.702 m)      Body mass index is 28.35 kg/m.    Physical Exam:     Bilateral hip: No deformity, swelling or wasting ROM Flexion 80, ext 20, IR 25, ER 35 NTTP over the hip flexors, greater trochanter, gluteal musculature, si joint, lumbar spine Positive log roll with FROM, but less pain compared to prior office visit Positive FABER, but less pain compared to prior office visit Positive FADIR, but less pain compared to prior office visit Gait normal    Electronically signed by:  Marshall Skeeter D.[facility] 9:10 AM 02/29/24

## 2024-02-29 ENCOUNTER — Ambulatory Visit: Payer: Medicare (Managed Care) | Admitting: Sports Medicine

## 2024-02-29 VITALS — BP 120/70 | HR 83 | Ht 67.0 in | Wt 181.0 lb

## 2024-02-29 DIAGNOSIS — M160 Bilateral primary osteoarthritis of hip: Secondary | ICD-10-CM | POA: Diagnosis not present

## 2024-02-29 DIAGNOSIS — M25552 Pain in left hip: Secondary | ICD-10-CM | POA: Diagnosis not present

## 2024-02-29 DIAGNOSIS — M25551 Pain in right hip: Secondary | ICD-10-CM

## 2024-02-29 NOTE — Patient Instructions (Addendum)
 Hep hip reprint. - Start Tylenol  500 to 1000 mg tablets 2-3 times a day for day-to-day pain relief. Follow up in 2 months.

## 2024-03-16 ENCOUNTER — Other Ambulatory Visit: Payer: Self-pay | Admitting: Internal Medicine

## 2024-03-16 DIAGNOSIS — G4700 Insomnia, unspecified: Secondary | ICD-10-CM

## 2024-03-16 MED ORDER — ZOLPIDEM TARTRATE 10 MG PO TABS: 10.0000 mg | ORAL_TABLET | Freq: Every evening | ORAL | 1 refills | Status: DC | PRN

## 2024-03-16 NOTE — Telephone Encounter (Signed)
 Copied from CRM [id number]. Topic: Clinical - Medication Refill >> Mar 16, 2024  9:52 AM Vivian Z wrote: Medication: zolpidem  (AMBIEN ) 10 MG tablet  Has the patient contacted their pharmacy? Yes (Agent: If no, request that the patient contact the pharmacy for the refill. If patient does not wish to contact the pharmacy document the reason why and proceed with request.) (Agent: If yes, when and what did the pharmacy advise?) no refills available.  This is the patient's preferred pharmacy:  [facility] - [place] - [address] [place] [address] Phone: [phone] Fax: [phone]  Is this the correct pharmacy for this prescription? Yes If no, delete pharmacy and type the correct one.   Has the prescription been filled recently? No  Is the patient out of the medication? No  Has the patient been seen for an appointment in the last year OR does the patient have an upcoming appointment? Yes  Can we respond through MyChart? No  Agent: Please be advised that Rx refills may take up to 3 business days. We ask that you follow-up with your pharmacy.

## 2024-03-20 ENCOUNTER — Other Ambulatory Visit: Payer: Self-pay | Admitting: Internal Medicine

## 2024-03-20 ENCOUNTER — Ambulatory Visit: Payer: Medicare (Managed Care)

## 2024-03-21 ENCOUNTER — Other Ambulatory Visit: Payer: Self-pay

## 2024-03-28 ENCOUNTER — Other Ambulatory Visit: Payer: Self-pay | Admitting: Internal Medicine

## 2024-03-28 ENCOUNTER — Other Ambulatory Visit: Payer: Self-pay

## 2024-03-28 MED ORDER — GABAPENTIN 300 MG PO CAPS: 300.0000 mg | ORAL_CAPSULE | Freq: Every day | ORAL | 1 refills | Status: AC

## 2024-03-28 NOTE — Telephone Encounter (Unsigned)
 Copied from CRM [id number]. Topic: Clinical - Medication Refill >> Mar 28, 2024  8:31 AM Turkey A wrote: Medication: gabapentin  (NEURONTIN ) 300 MG capsule  Has the patient contacted their pharmacy? Yes (Agent: If no, request that the patient contact the pharmacy for the refill. If patient does not wish to contact the pharmacy document the reason why and proceed with request.) (Agent: If yes, when and what did the pharmacy advise?)  This is the patient's preferred pharmacy:  [facility] - [place], [place] - [address] [place] [address] Phone: [phone] Fax: [phone]  Is this the correct pharmacy for this prescription? Yes If no, delete pharmacy and type the correct one.   Has the prescription been filled recently? No  Is the patient out of the medication? No  Has the patient been seen for an appointment in the last year OR does the patient have an upcoming appointment? Yes  Can we respond through MyChart? Yes  Agent: Please be advised that Rx refills may take up to 3 business days. We ask that you follow-up with your pharmacy.

## 2024-04-02 ENCOUNTER — Ambulatory Visit (INDEPENDENT_AMBULATORY_CARE_PROVIDER_SITE_OTHER): Payer: Medicare (Managed Care)

## 2024-04-02 VITALS — Ht 67.0 in | Wt 181.0 lb

## 2024-04-02 DIAGNOSIS — Z0000 Encounter for general adult medical examination without abnormal findings: Secondary | ICD-10-CM

## 2024-04-02 NOTE — Progress Notes (Signed)
 Subjective:   Laura Poole is a 73 y.o. who presents for a Medicare Wellness preventive visit.  As a reminder, Annual Wellness Visits don't include a physical exam, and some assessments may be limited, especially if this visit is performed virtually. We may recommend an in-person follow-up visit with your provider if needed.  Visit Complete: Virtual I connected with  Arlyne Croak on 04/02/24 by a audio enabled telemedicine application and verified that I am speaking with the correct person using two identifiers.  Patient Location: Home  Provider Location: Home Office  I discussed the limitations of evaluation and management by telemedicine. The patient expressed understanding and agreed to proceed.  Vital Signs: Because this visit was a virtual/telehealth visit, some criteria may be missing or patient reported. Any vitals not documented were not able to be obtained and vitals that have been documented are patient reported.  VideoDeclined- This patient declined Librarian, academic. Therefore the visit was completed with audio only.  Persons Participating in Visit: Patient.  AWV Questionnaire: No: Patient Medicare AWV questionnaire was not completed prior to this visit.  Cardiac Risk Factors include: advanced age (>15men, >17 women);dyslipidemia;hypertension     Objective:    Today's Vitals   04/02/24 0848  Weight: 181 lb (82.1 kg)  Height: 5' 7 (1.702 m)   Body mass index is 28.35 kg/m.     04/02/2024    8:55 AM 03/15/2023   10:58 AM 03/19/2021    3:35 PM 01/28/2012    9:13 AM  Advanced Directives  Does Patient Have a Medical Advance Directive? Yes No No Patient does not have advance directive   Type of Advance Directive Healthcare Power of Attorney     Copy of Healthcare Power of Attorney in Chart? No - copy requested     Would patient like information on creating a medical advance directive?  No - Patient declined    Pre-existing out of  facility DNR order (yellow form or pink MOST form)    No      Data saved with a previous flowsheet row definition    Current Medications (verified) Outpatient Encounter Medications as of 04/02/2024  Medication Sig   acetaminophen  (TYLENOL ) 500 MG tablet Take 500 mg by mouth every 6 (six) hours as needed for mild pain or headache.   albuterol  (VENTOLIN  HFA) 108 (90 Base) MCG/ACT inhaler USE 2 INHALATIONS EVERY 6 HOURS AS NEEDED FOR WHEEZING OR SHORTNESS OF BREATH   amLODipine  (NORVASC ) 2.5 MG tablet TAKE 1 TABLET DAILY   aspirin  81 MG tablet Take 1 tablet (81 mg total) by mouth daily.   carvedilol  (COREG ) 12.5 MG tablet Take 1 tablet (12.5 mg total) by mouth 2 (two) times daily.   fluticasone -salmeterol (ADVAIR DISKUS) 250-50 MCG/ACT AEPB Inhale 1 puff into the lungs in the morning and at bedtime.   gabapentin  (NEURONTIN ) 300 MG capsule Take 1 capsule (300 mg total) by mouth at bedtime.   HYDROcodone -acetaminophen  (NORCO/VICODIN) 5-325 MG tablet Take 1 tablet by mouth every 6 (six) hours as needed for moderate pain (pain score 4-6).   losartan  (COZAAR ) 100 MG tablet Take 1 tablet (100 mg total) by mouth daily.   meloxicam  (MOBIC ) 15 MG tablet Take 1 tablet (15 mg total) by mouth daily as needed for pain.   rosuvastatin  (CRESTOR ) 10 MG tablet Take 1 tablet (10 mg total) by mouth daily.   solifenacin  (VESICARE ) 5 MG tablet TAKE 1 TABLET DAILY   spironolactone  (ALDACTONE ) 25 MG tablet TAKE 1 TABLET  TWICE A DAY   tiZANidine  (ZANAFLEX ) 2 MG tablet TAKE 1 TABLET EVERY 6 HOURS AS NEEDED FOR MUSCLE SPASMS   triamcinolone  (NASACORT ) 55 MCG/ACT AERO nasal inhaler Place 2 sprays into the nose daily.   vitamin C (ASCORBIC ACID) 500 MG tablet Take 500 mg by mouth daily.   zolpidem  (AMBIEN ) 10 MG tablet Take 1 tablet (10 mg total) by mouth at bedtime as needed. for sleep   No facility-administered encounter medications on file as of 04/02/2024.    Allergies (verified) Patient has no known allergies.    History: Past Medical History:  Diagnosis Date   Aortic stenosis    mild to moderate on echo 02/2021 with no AS on echo 02/2022 and none by echo 02/28/2023   Asymmetric septal hypertrophy    Severe by echo but mild by cardiac MRI in 2018 with no evidence of HOCM   Bilateral leg cramps 09/01/2015   Breast cancer (HCC)    left   Essential hypertension 01/07/2010   Qualifier: Diagnosis of  By: Tish MD, Elsie     GERD (gastroesophageal reflux disease)    H/O: hysterectomy    History of breast cancer 2006   History of colonic polyps    HLD (hyperlipidemia)    Mitral regurgitation 08/05/2015   Moderate by echo 02/2024   Personal history of chemotherapy 2006   Left Breast Cancer   Personal history of radiation therapy 2006   Left Breast Cancer   Snoring 03/30/2017   Past Surgical History:  Procedure Laterality Date   ABDOMINAL HYSTERECTOMY     BREAST LUMPECTOMY Left 2006   COLONOSCOPY W/ POLYPECTOMY  2004, 2009   Dr Rosalie   TUBAL LIGATION     Family History  Problem Relation Age of Onset   Heart attack Father 61   Hypertension Mother    Coronary artery disease Mother    Aneurysm Brother    Asthma Other        nephew   Hyperlipidemia Other        nephew   Sudden death Other        nephew   Breast cancer Paternal Grandmother    Colon cancer Neg Hx    Social History   Socioeconomic History   Marital status: Single    Spouse name: Not on file   Number of children: Not on file   Years of education: 12   Highest education level: High school graduate  Occupational History   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  Tobacco Use   Smoking status: Never   Smokeless tobacco: Never  Vaping Use   Vaping status: Never Used  Substance and Sexual Activity   Alcohol use: Yes    Alcohol/week: 2.0 standard drinks of alcohol    Types: 2 Cans of beer per week    Comment: rarely   Drug use: No   Sexual activity: Not on file  Other Topics Concern   Not on file   Social History Narrative   Patient's grand daughter stays with her/2025   Social Drivers of Health   Financial Resource Strain: Low Risk  (03/15/2023)   Overall Financial Resource Strain (CARDIA)    Difficulty of Paying Living Expenses: Not hard at all  Food Insecurity: No Food Insecurity (03/15/2023)   Hunger Vital Sign    Worried About Running Out of Food in the Last Year: Never true    Ran Out of Food in the Last Year: Never true  Transportation Needs: No  Transportation Needs (03/15/2023)   PRAPARE - Administrator, Civil Service (Medical): No    Lack of Transportation (Non-Medical): No  Physical Activity: Insufficiently Active (04/02/2024)   Exercise Vital Sign    Days of Exercise per Week: 6 days    Minutes of Exercise per Session: 20 min  Stress: No Stress Concern Present (04/02/2024)   [employer] of Occupational Health - Occupational Stress Questionnaire    Feeling of Stress: Not at all  Social Connections: Moderately Integrated (04/02/2024)   Social Connection and Isolation Panel    Frequency of Communication with Friends and Family: More than three times a week    Frequency of Social Gatherings with Friends and Family: Twice a week    Attends Religious Services: More than 4 times per year    Active Member of [employer] or Organizations: Yes    Attends Banker Meetings: 1 to 4 times per year    Marital Status: Never married    Tobacco Counseling Counseling given: Not Answered    Clinical Intake:  Pre-visit preparation completed: Yes  Pain : No/denies pain     BMI - recorded: 28.35 Nutritional Status: BMI 25 -29 Overweight Nutritional Risks: None  Lab Results  Component Value Date   HGBA1C 6.5 12/06/2023   HGBA1C 6.4 04/07/2023   HGBA1C 6.5 11/17/2022     How often do you need to have someone help you when you read instructions, pamphlets, or other written materials from your doctor or pharmacy?: 1 - Never  Interpreter Needed?:  No  Information entered by :: Kristeen Lantz, RMA   Activities of Daily Living     04/02/2024    8:48 AM  In your present state of health, do you have any difficulty performing the following activities:  Hearing? 0  Vision? 0  Difficulty concentrating or making decisions? 0  Walking or climbing stairs? 0  Dressing or bathing? 0  Doing errands, shopping? 0  Preparing Food and eating ? N  Using the Toilet? N  In the past six months, have you accidently leaked urine? N  Do you have problems with loss of bowel control? N  Managing your Medications? N  Managing your Finances? N  Housekeeping or managing your Housekeeping? N    Patient Care Team: Norleen Lynwood ORN, MD as PCP - General (Internal Medicine) Shlomo Wilbert SAUNDERS, MD as PCP - Cardiology (Cardiology) I have updated your Care Teams any recent Medical Services you may have received from other providers in the past year.     Assessment:   This is a routine wellness examination for [place].  Hearing/Vision screen Hearing Screening - Comments:: Denies hearing difficulties   Vision Screening - Comments:: Wears eyeglasses/   Goals Addressed             This Visit's Progress    Patient Stated   On track    My goal is to eat healthy, get plenty of rest and enjoy my seven grandchildren.       Depression Screen     04/02/2024    9:00 AM 02/03/2024    8:30 AM 12/27/2023    2:44 PM 12/06/2023    8:08 AM 08/18/2023   10:45 AM 04/07/2023   11:11 AM 03/15/2023   10:52 AM  PHQ 2/9 Scores  PHQ - 2 Score 0 0 0 0 0 0 0  PHQ- 9 Score 0          Fall Risk  04/02/2024    8:56 AM 02/03/2024    8:38 AM 12/27/2023    2:51 PM 12/06/2023    8:14 AM 08/18/2023   10:44 AM  Fall Risk   Falls in the past year? 1 1 1 1 1   Number falls in past yr: 0 0 1 0 0  Injury with Fall? 0 0 1 0 0  Risk for fall due to :  History of fall(s) History of fall(s) History of fall(s) No Fall Risks  Follow up Falls evaluation completed;Falls prevention  discussed Falls evaluation completed Falls evaluation completed Falls evaluation completed Falls evaluation completed    MEDICARE RISK AT HOME:  Medicare Risk at Home Any stairs in or around the home?: Yes (3 steps coming into home) If so, are there any without handrails?: No Home free of loose throw rugs in walkways, pet beds, electrical cords, etc?: Yes Adequate lighting in your home to reduce risk of falls?: Yes Life alert?: No Use of a cane, walker or w/c?: No Grab bars in the bathroom?: Yes Shower chair or bench in shower?: Yes Elevated toilet seat or a handicapped toilet?: Yes  TIMED UP AND GO:  Was the test performed?  No  Cognitive Function: Declined/Normal: No cognitive concerns noted by patient or family. Patient alert, oriented, able to answer questions appropriately and recall recent events. No signs of memory loss or confusion.        03/15/2023   10:58 AM 03/22/2022    4:54 PM  6CIT Screen  What Year? 0 points 0 points  What month? 0 points 0 points  What time? 0 points 0 points  Count back from 20 0 points 0 points  Months in reverse 0 points 0 points  Repeat phrase 0 points 0 points  Total Score 0 points 0 points    Immunizations Immunization History  Administered Date(s) Administered   Fluad Quad(high Dose 65+) 06/15/2019, 08/04/2020, 07/27/2021, 07/06/2022   Influenza Split 08/09/2012   Influenza, High Dose Seasonal PF 07/29/2017, 06/27/2018   Influenza-Unspecified 07/11/2013, 10/17/2023   Moderna Covid-19 Vaccine Bivalent Booster 34yrs & up 12/25/2021   Moderna SARS-COV2 Booster Vaccination 08/07/2020, 03/19/2021, 04/20/2021   Moderna Sars-Covid-2 Vaccination 10/26/2019, 11/23/2019   PPD Test 11/26/2022   Pneumococcal Conjugate-13 06/27/2018   Pneumococcal Polysaccharide-23 01/09/2020   Respiratory Syncytial Virus Vaccine,Recomb Aduvanted(Arexvy) 08/22/2023   Tetanus 12/04/2012   Zoster, Live 05/20/2016    Screening Tests Health Maintenance   Topic Date Due   Zoster Vaccines- Shingrix (1 of 2) 04/11/1972   DTaP/Tdap/Td (1 - Tdap) 12/05/2012   COVID-19 Vaccine (4 - 2024-25 season) 06/12/2023   INFLUENZA VACCINE  05/11/2024   Medicare Annual Wellness (AWV)  12/05/2024   MAMMOGRAM  02/23/2026   Colonoscopy  08/19/2029   Pneumococcal Vaccine: 50+ Years  Completed   DEXA SCAN  Completed   Hepatitis C Screening  Completed   HPV VACCINES  Aged Out   Meningococcal B Vaccine  Aged Out    Health Maintenance  Health Maintenance Due  Topic Date Due   Zoster Vaccines- Shingrix (1 of 2) 04/11/1972   DTaP/Tdap/Td (1 - Tdap) 12/05/2012   COVID-19 Vaccine (4 - 2024-25 season) 06/12/2023   Health Maintenance Items Addressed: See Nurse Notes at the end of this note  Additional Screening:  Vision Screening: Recommended annual ophthalmology exams for early detection of glaucoma and other disorders of the eye. Would you like a referral to an eye doctor? No    Dental Screening: Recommended annual dental exams for  proper oral hygiene  Community Resource Referral / Chronic Care Management: CRR required this visit?  No   CCM required this visit?  No   Plan:    I have personally reviewed and noted the following in the patient's chart:   Medical and social history Use of alcohol, tobacco or illicit drugs  Current medications and supplements including opioid prescriptions. Patient is not currently taking opioid prescriptions. Functional ability and status Nutritional status Physical activity Advanced directives List of other physicians Hospitalizations, surgeries, and ER visits in previous 12 months Vitals Screenings to include cognitive, depression, and falls Referrals and appointments  In addition, I have reviewed and discussed with patient certain preventive protocols, quality metrics, and best practice recommendations. A written personalized care plan for preventive services as well as general preventive health  recommendations were provided to patient.   Florette Thai L Cisco Kindt, CMA   04/02/2024   After Visit Summary: (MyChart) Due to this being a telephonic visit, the after visit summary with patients personalized plan was offered to patient via MyChart   Notes: Nothing significant to report at this time.

## 2024-04-02 NOTE — Patient Instructions (Signed)
 Laura Poole , Thank you for taking time out of your busy schedule to complete your Annual Wellness Visit with me. I enjoyed our conversation and look forward to speaking with you again next year. I, as well as your care team,  appreciate your ongoing commitment to your health goals. Please review the following plan we discussed and let me know if I can assist you in the future. Your Game plan/ To Do List    Follow up Visits: Next Medicare AWV with our clinical staff: 04/03/2025.   Have you seen your provider in the last 6 months (3 months if uncontrolled diabetes)? Yes Next Office Visit with your provider: Patient stated that she will call office to schedule her a 6 month follow up visit.  Last office visit was on 02/03/2024.  Clinician Recommendations:  Aim for 30 minutes of exercise or brisk walking, 6-8 glasses of water, and 5 servings of fruits and vegetables each day. Please get a copy of your last Tetanus vaccine.  Keep up the good work.      This is a list of the screening recommended for you and due dates:  Health Maintenance  Topic Date Due   Zoster (Shingles) Vaccine (1 of 2) 04/11/1972   DTaP/Tdap/Td vaccine (1 - Tdap) 12/05/2012   COVID-19 Vaccine (4 - 2024-25 season) 06/12/2023   Flu Shot  05/11/2024   Medicare Annual Wellness Visit  12/05/2024   Mammogram  02/23/2026   Colon Cancer Screening  08/19/2029   Pneumococcal Vaccine for age over 56  Completed   DEXA scan (bone density measurement)  Completed   Hepatitis C Screening  Completed   HPV Vaccine  Aged Out   Meningitis B Vaccine  Aged Out    Advanced directives: (Copy Requested) Please bring a copy of your health care power of attorney and living will to the office to be added to your chart at your convenience. You can mail to [facility] [address] [place], [place] [address] or email to [email or web address] Advance Care Planning is important because it:  [x]  Makes sure you receive the medical  care that is consistent with your values, goals, and preferences  [x]  It provides guidance to your family and loved ones and reduces their decisional burden about whether or not they are making the right decisions based on your wishes.  Follow the link provided in your after visit summary or read over the paperwork we have mailed to you to help you started getting your Advance Directives in place. If you need assistance in completing these, please reach out to us  so that we can help you!  See attachments for Preventive Care and Fall Prevention Tips.

## 2024-04-17 ENCOUNTER — Ambulatory Visit: Payer: Self-pay

## 2024-04-17 NOTE — Telephone Encounter (Signed)
  FYI Only or Action Required?: FYI only for provider.  Patient was last seen in primary care on 02/03/2024 by Norleen Lynwood ORN, MD.  Called Nurse Triage reporting Leg Pain.  Symptoms began a week ago.  Interventions attempted: OTC medications: tylenol .  Symptoms are: unchanged.  Triage Disposition: See PCP When Office is Open (Within 3 Days) Will follow up with orthopedics, Dr. Morene Mace  Patient/caregiver understands and will follow disposition?: Yes   Copied from CRM [id number]. Topic: Clinical - Red Word Triage >> Apr 17, 2024  5:28 PM Drema MATSU wrote: Red Word that prompted transfer to Nurse Triage: Patient is having a continuing pain in right leg. She thinks that the pain could be because she need a hip replacement due to her hurting her hip a few years ago or an aneurysm. Reason for Disposition  [1] MODERATE pain (e.g., interferes with normal activities, limping) AND [2] present > 3 days  Answer Assessment - Initial Assessment Questions 1. LOCATION and RADIATION: Where is the pain located?      Right hip pain that radiates down her leg 2. QUALITY: What does the pain feel like?  (e.g., sharp, dull, aching, burning)     Aching pain in hip, burning pain in her leg 3. SEVERITY: How bad is the pain? What does it keep you from doing?   (Scale 1-10; or mild, moderate, severe)   -  MILD (1-3): doesn't interfere with normal activities    -  MODERATE (4-7): interferes with normal activities (e.g., work or school) or awakens from sleep, limping    -  SEVERE (8-10): excruciating pain, unable to do any normal activities, unable to walk     Moderate pain 4. ONSET: When did the pain start? Does it come and go, or is it there all the time?     About a week alo 5. WORK OR EXERCISE: Has there been any recent work or exercise that involved this part of the body?      She says that she may have over done it last week when she had a lot of company 6. CAUSE: What do you think is  causing the hip pain?      States that she has a history of arthritis  Protocols used: Hip Pain-A-AH

## 2024-04-19 ENCOUNTER — Ambulatory Visit: Payer: Medicare (Managed Care) | Admitting: Sports Medicine

## 2024-04-19 VITALS — BP 130/78 | HR 79 | Ht 67.0 in | Wt 181.0 lb

## 2024-04-19 DIAGNOSIS — M7061 Trochanteric bursitis, right hip: Secondary | ICD-10-CM | POA: Diagnosis not present

## 2024-04-19 DIAGNOSIS — M79661 Pain in right lower leg: Secondary | ICD-10-CM | POA: Diagnosis not present

## 2024-04-19 NOTE — Patient Instructions (Signed)
 Tylenol  [phone] mg 2-3 times a day for pain relief  NSAIDs 1-2 times per week as needed  Hip and glute HEP  Work note provided we recommend an ergonomic chair to decrease musculoskeletal pain which will decrease patient absences from work  4 week follow up

## 2024-04-19 NOTE — Progress Notes (Signed)
 [name] [address] [place] [address] Phone: [phone]   Assessment and Plan:     1. Greater trochanteric bursitis of right hip 2. Right calf pain  -Acute, initial visit - Most consistent with musculoskeletal pain including gluteal tension causing greater trochanteric bursitis, right calf strain, likely caused by increased physical activity dancing and celebrating her birthday.  Differential includes lumbar etiology and lumbar radiculopathy versus flare of hip osteoarthritis, however these are less likely based on relatively unremarkable physical exam at today's visit - Use Tylenol  500 to 1000 mg tablets 2-3 times a day for day-to-day pain relief - May use NSAIDs as needed for breakthrough pain.  Recommend limiting chronic NSAIDs to 1-2 doses per week - Start HEP for hip - Recommend patient have an ergonomic chair to decrease musculoskeletal pain which will decrease absences from work.  Work note provided  15 additional minutes spent for educating Therapeutic Home Exercise Program.  This included exercises focusing on stretching, strengthening, with focus on eccentric aspects.   Long term goals include an improvement in range of motion, strength, endurance as well as avoiding reinjury. Patient's frequency would include in 1-2 times a day, 3-5 times a week for a duration of 6-12 weeks. Proper technique shown and discussed handout in great detail with ATC.  All questions were discussed and answered.    Pertinent previous records reviewed include none  Follow Up: 4 weeks for reevaluation.  If no improvement or worsening of symptoms, would obtain lumbar x-ray to evaluate for lumbar DDD potentially causing lumbar radiculopathy.  Could also consider repeat intra-articular CSI if presentation is more consistent with hip pathology   Subjective:   I, Laura Poole, am serving as a Neurosurgeon for Doctor Morene Mace  Chief  Complaint: hip pain    HPI:    02/08/2024 Patient is a 73 year old female with hip pain. Patient states intermittent bilat hip pain that radiates down her leg. Has fallen 4 times since November. Her last fall she fell in her yard. And that one hurt the most. Dx arthritis when she saw Dr. Norleen. She has tried oxy tramadol , and meloxicam  she did not like oxy or tramadol .  Meloxicam  does not last long enough. No numbness or tingling. Pain when walking, decreased ROM. Going up and down steps is hard. She is not able to do her ALDs   02/29/2024 Patient states compared to the last time the pain is much better than what it was. There is still pain though. Been doing the exercises and the exercise bike. Can do a lot more things that she could not do before.   04/19/2024 Patient states she had a pain flare 04/11/2024. But, she took two aleve and that helped    Relevant Historical Information: History of breast cancer, GERD, hypertension,  Additional pertinent review of systems negative.   Current Outpatient Medications:    acetaminophen  (TYLENOL ) 500 MG tablet, Take 500 mg by mouth every 6 (six) hours as needed for mild pain or headache., Disp: , Rfl:    albuterol  (VENTOLIN  HFA) 108 (90 Base) MCG/ACT inhaler, USE 2 INHALATIONS EVERY 6 HOURS AS NEEDED FOR WHEEZING OR SHORTNESS OF BREATH, Disp: 18 g, Rfl: 5   amLODipine  (NORVASC ) 2.5 MG tablet, TAKE 1 TABLET DAILY, Disp: 180 tablet, Rfl: 3   aspirin  81 MG tablet, Take 1 tablet (81 mg total) by mouth daily., Disp: 30 tablet, Rfl: 6   carvedilol  (COREG ) 12.5 MG tablet,  Take 1 tablet (12.5 mg total) by mouth 2 (two) times daily., Disp: 180 tablet, Rfl: 3   fluticasone -salmeterol (ADVAIR DISKUS) 250-50 MCG/ACT AEPB, Inhale 1 puff into the lungs in the morning and at bedtime., Disp: 3 each, Rfl: 3   gabapentin  (NEURONTIN ) 300 MG capsule, Take 1 capsule (300 mg total) by mouth at bedtime., Disp: 270 capsule, Rfl: 1   HYDROcodone -acetaminophen  (NORCO/VICODIN)  5-325 MG tablet, Take 1 tablet by mouth every 6 (six) hours as needed for moderate pain (pain score 4-6)., Disp: 30 tablet, Rfl: 0   losartan  (COZAAR ) 100 MG tablet, Take 1 tablet (100 mg total) by mouth daily., Disp: 90 tablet, Rfl: 3   meloxicam  (MOBIC ) 15 MG tablet, Take 1 tablet (15 mg total) by mouth daily as needed for pain., Disp: 90 tablet, Rfl: 1   rosuvastatin  (CRESTOR ) 10 MG tablet, Take 1 tablet (10 mg total) by mouth daily., Disp: 90 tablet, Rfl: 3   solifenacin  (VESICARE ) 5 MG tablet, TAKE 1 TABLET DAILY, Disp: 90 tablet, Rfl: 3   spironolactone  (ALDACTONE ) 25 MG tablet, TAKE 1 TABLET TWICE A DAY, Disp: 180 tablet, Rfl: 3   tiZANidine  (ZANAFLEX ) 2 MG tablet, TAKE 1 TABLET EVERY 6 HOURS AS NEEDED FOR MUSCLE SPASMS, Disp: 360 tablet, Rfl: 3   triamcinolone  (NASACORT ) 55 MCG/ACT AERO nasal inhaler, Place 2 sprays into the nose daily., Disp: 1 Inhaler, Rfl: 12   vitamin C (ASCORBIC ACID) 500 MG tablet, Take 500 mg by mouth daily., Disp: , Rfl:    zolpidem  (AMBIEN ) 10 MG tablet, Take 1 tablet (10 mg total) by mouth at bedtime as needed. for sleep, Disp: 90 tablet, Rfl: 1   Objective:     Vitals:   04/19/24 0904  BP: 130/78  Pulse: 79  SpO2: 97%  Weight: 181 lb (82.1 kg)  Height: 5' 7 (1.702 m)      Body mass index is 28.35 kg/m.    Physical Exam:    General: awake, alert, and oriented no acute distress, nontoxic Skin: no suspicious lesions or rashes Neuro:sensation intact distally with no deficits, normal muscle tone, no atrophy, strength 5/5 in all tested lower ext groups Psych: normal mood and affect, speech clear   Right hip: No deformity, swelling or wasting ROM Flexion 90, ext 30, IR 45, ER 45 TTP greater trochanteric, medial gastroc NTTP over the hip flexors,  gluteal musculature, si joint, lumbar spine Negative log roll with FROM Negative FABER Negative FADIR Negative Piriformis test Negative trendelenberg Gait normal  Negative straight leg  raise  Electronically signed by:  [name] [name] 9:27 AM 04/19/24

## 2024-05-01 ENCOUNTER — Ambulatory Visit: Payer: Medicare (Managed Care) | Admitting: Sports Medicine

## 2024-05-10 ENCOUNTER — Institutional Professional Consult (permissible substitution): Payer: Medicare (Managed Care) | Admitting: Plastic Surgery

## 2024-05-11 DIAGNOSIS — H524 Presbyopia: Secondary | ICD-10-CM | POA: Diagnosis not present

## 2024-05-11 DIAGNOSIS — H52223 Regular astigmatism, bilateral: Secondary | ICD-10-CM | POA: Diagnosis not present

## 2024-05-11 DIAGNOSIS — H5213 Myopia, bilateral: Secondary | ICD-10-CM | POA: Diagnosis not present

## 2024-05-16 NOTE — Progress Notes (Unsigned)
 Ben Jackson [name] [address] [place] [address] Phone: [phone]   Assessment and Plan:     There are no diagnoses linked to this encounter.  ***   Pertinent previous records reviewed include ***    Follow Up: ***     Subjective:   I, Laura Poole, am serving as a Neurosurgeon for Doctor Morene Mace   Chief Complaint: hip pain    HPI:    02/08/2024 Patient is a 73 year old female with hip pain. Patient states intermittent bilat hip pain that radiates down her leg. Has fallen 4 times since November. Her last fall she fell in her yard. And that one hurt the most. Dx arthritis when she saw Dr. Norleen. She has tried oxy tramadol , and meloxicam  she did not like oxy or tramadol .  Meloxicam  does not last long enough. No numbness or tingling. Pain when walking, decreased ROM. Going up and down steps is hard. She is not able to do her ALDs   02/29/2024 Patient states compared to the last time the pain is much better than what it was. There is still pain though. Been doing the exercises and the exercise bike. Can do a lot more things that she could not do before.    04/19/2024 Patient states she had a pain flare 04/11/2024. But, she took two aleve and that helped   05/17/2024 Patient states   Relevant Historical Information: History of breast cancer, GERD, hypertension,  Additional pertinent review of systems negative.   Current Outpatient Medications:    acetaminophen  (TYLENOL ) 500 MG tablet, Take 500 mg by mouth every 6 (six) hours as needed for mild pain or headache., Disp: , Rfl:    albuterol  (VENTOLIN  HFA) 108 (90 Base) MCG/ACT inhaler, USE 2 INHALATIONS EVERY 6 HOURS AS NEEDED FOR WHEEZING OR SHORTNESS OF BREATH, Disp: 18 g, Rfl: 5   amLODipine  (NORVASC ) 2.5 MG tablet, TAKE 1 TABLET DAILY, Disp: 180 tablet, Rfl: 3   aspirin  81 MG tablet, Take 1 tablet (81 mg total) by mouth daily., Disp: 30 tablet, Rfl: 6   carvedilol  (COREG )  12.5 MG tablet, Take 1 tablet (12.5 mg total) by mouth 2 (two) times daily., Disp: 180 tablet, Rfl: 3   fluticasone -salmeterol (ADVAIR DISKUS) 250-50 MCG/ACT AEPB, Inhale 1 puff into the lungs in the morning and at bedtime., Disp: 3 each, Rfl: 3   gabapentin  (NEURONTIN ) 300 MG capsule, Take 1 capsule (300 mg total) by mouth at bedtime., Disp: 270 capsule, Rfl: 1   HYDROcodone -acetaminophen  (NORCO/VICODIN) 5-325 MG tablet, Take 1 tablet by mouth every 6 (six) hours as needed for moderate pain (pain score 4-6)., Disp: 30 tablet, Rfl: 0   losartan  (COZAAR ) 100 MG tablet, Take 1 tablet (100 mg total) by mouth daily., Disp: 90 tablet, Rfl: 3   meloxicam  (MOBIC ) 15 MG tablet, Take 1 tablet (15 mg total) by mouth daily as needed for pain., Disp: 90 tablet, Rfl: 1   rosuvastatin  (CRESTOR ) 10 MG tablet, Take 1 tablet (10 mg total) by mouth daily., Disp: 90 tablet, Rfl: 3   solifenacin  (VESICARE ) 5 MG tablet, TAKE 1 TABLET DAILY, Disp: 90 tablet, Rfl: 3   spironolactone  (ALDACTONE ) 25 MG tablet, TAKE 1 TABLET TWICE A DAY, Disp: 180 tablet, Rfl: 3   tiZANidine  (ZANAFLEX ) 2 MG tablet, TAKE 1 TABLET EVERY 6 HOURS AS NEEDED FOR MUSCLE SPASMS, Disp: 360 tablet, Rfl: 3   triamcinolone  (NASACORT ) 55 MCG/ACT AERO nasal inhaler, Place 2 sprays into  the nose daily., Disp: 1 Inhaler, Rfl: 12   vitamin C (ASCORBIC ACID) 500 MG tablet, Take 500 mg by mouth daily., Disp: , Rfl:    zolpidem  (AMBIEN ) 10 MG tablet, Take 1 tablet (10 mg total) by mouth at bedtime as needed. for sleep, Disp: 90 tablet, Rfl: 1   Objective:     There were no vitals filed for this visit.    There is no height or weight on file to calculate BMI.    Physical Exam:    ***   Electronically signed by:  [name] [name] 7:47 AM 05/16/24

## 2024-05-17 ENCOUNTER — Ambulatory Visit: Payer: Medicare (Managed Care) | Admitting: Sports Medicine

## 2024-05-17 ENCOUNTER — Ambulatory Visit (INDEPENDENT_AMBULATORY_CARE_PROVIDER_SITE_OTHER): Payer: Medicare (Managed Care) | Admitting: Sports Medicine

## 2024-05-17 VITALS — HR 78 | Ht 67.0 in | Wt 184.0 lb

## 2024-05-17 DIAGNOSIS — M25552 Pain in left hip: Secondary | ICD-10-CM | POA: Diagnosis not present

## 2024-05-17 DIAGNOSIS — M7061 Trochanteric bursitis, right hip: Secondary | ICD-10-CM | POA: Diagnosis not present

## 2024-05-17 DIAGNOSIS — M25551 Pain in right hip: Secondary | ICD-10-CM

## 2024-05-17 DIAGNOSIS — M160 Bilateral primary osteoarthritis of hip: Secondary | ICD-10-CM | POA: Diagnosis not present

## 2024-05-17 NOTE — Patient Instructions (Signed)
 Continue HEP  2-4 week follow up

## 2024-06-06 ENCOUNTER — Telehealth: Payer: Self-pay

## 2024-06-06 NOTE — Progress Notes (Deleted)
 Ben Jackson [name] [address] [place] [address] Phone: [phone]   Assessment and Plan:         ***   Pertinent previous records reviewed include ***   Follow Up: ***     Subjective:   I, Chestine Reeves, am serving as a Neurosurgeon for Doctor Morene Mace   Chief Complaint: hip pain    HPI:    02/08/2024 Patient is a 73 year old female with hip pain. Patient states intermittent bilat hip pain that radiates down her leg. Has fallen 4 times since November. Her last fall she fell in her yard. And that one hurt the most. Dx arthritis when she saw Dr. Norleen. She has tried oxy tramadol , and meloxicam  she did not like oxy or tramadol .  Meloxicam  does not last long enough. No numbness or tingling. Pain when walking, decreased ROM. Going up and down steps is hard. She is not able to do her ALDs   02/29/2024 Patient states compared to the last time the pain is much better than what it was. There is still pain though. Been doing the exercises and the exercise bike. Can do a lot more things that she could not do before.    04/19/2024 Patient states she had a pain flare 04/11/2024. But, she took two aleve and that helped    05/17/2024 Patient states she was doing well until last week she over did it working in her yards.   06/07/2024 Patient states   Relevant Historical Information: History of breast cancer, GERD, hypertension,  Additional pertinent review of systems negative.   Current Outpatient Medications:    acetaminophen  (TYLENOL ) 500 MG tablet, Take 500 mg by mouth every 6 (six) hours as needed for mild pain or headache., Disp: , Rfl:    albuterol  (VENTOLIN  HFA) 108 (90 Base) MCG/ACT inhaler, USE 2 INHALATIONS EVERY 6 HOURS AS NEEDED FOR WHEEZING OR SHORTNESS OF BREATH, Disp: 18 g, Rfl: 5   amLODipine  (NORVASC ) 2.5 MG tablet, TAKE 1 TABLET DAILY, Disp: 180 tablet, Rfl: 3   aspirin  81 MG tablet, Take 1 tablet (81 mg total) by  mouth daily., Disp: 30 tablet, Rfl: 6   carvedilol  (COREG ) 12.5 MG tablet, Take 1 tablet (12.5 mg total) by mouth 2 (two) times daily., Disp: 180 tablet, Rfl: 3   fluticasone -salmeterol (ADVAIR DISKUS) 250-50 MCG/ACT AEPB, Inhale 1 puff into the lungs in the morning and at bedtime., Disp: 3 each, Rfl: 3   gabapentin  (NEURONTIN ) 300 MG capsule, Take 1 capsule (300 mg total) by mouth at bedtime., Disp: 270 capsule, Rfl: 1   HYDROcodone -acetaminophen  (NORCO/VICODIN) 5-325 MG tablet, Take 1 tablet by mouth every 6 (six) hours as needed for moderate pain (pain score 4-6)., Disp: 30 tablet, Rfl: 0   losartan  (COZAAR ) 100 MG tablet, Take 1 tablet (100 mg total) by mouth daily., Disp: 90 tablet, Rfl: 3   meloxicam  (MOBIC ) 15 MG tablet, Take 1 tablet (15 mg total) by mouth daily as needed for pain., Disp: 90 tablet, Rfl: 1   rosuvastatin  (CRESTOR ) 10 MG tablet, Take 1 tablet (10 mg total) by mouth daily., Disp: 90 tablet, Rfl: 3   solifenacin  (VESICARE ) 5 MG tablet, TAKE 1 TABLET DAILY, Disp: 90 tablet, Rfl: 3   spironolactone  (ALDACTONE ) 25 MG tablet, TAKE 1 TABLET TWICE A DAY, Disp: 180 tablet, Rfl: 3   tiZANidine  (ZANAFLEX ) 2 MG tablet, TAKE 1 TABLET EVERY 6 HOURS AS NEEDED FOR MUSCLE SPASMS, Disp: 360 tablet,  Rfl: 3   triamcinolone  (NASACORT ) 55 MCG/ACT AERO nasal inhaler, Place 2 sprays into the nose daily., Disp: 1 Inhaler, Rfl: 12   vitamin C (ASCORBIC ACID) 500 MG tablet, Take 500 mg by mouth daily., Disp: , Rfl:    zolpidem  (AMBIEN ) 10 MG tablet, Take 1 tablet (10 mg total) by mouth at bedtime as needed. for sleep, Disp: 90 tablet, Rfl: 1   Objective:     There were no vitals filed for this visit.    There is no height or weight on file to calculate BMI.    Physical Exam:    ***   Electronically signed by:  [name] [name] 7:32 AM 06/06/24

## 2024-06-06 NOTE — Telephone Encounter (Signed)
 Copied from CRM [id number]. Topic: Clinical - Medication Question >> Jun 06, 2024  8:37 AM Berneda FALCON wrote: Reason for CRM: Pt states her great grandson is 76 months old and stayed with her, tested positive for COVID. She now has a sore throat and mild headache but tested herself at home and tested negative for COVID. Wants to know if we can call her in something for the sore throat and mild headache please.  [id number] [place], [place] - [address] AT [address] [place] [address] Phone: [phone] Fax: [phone] Hours: Not open 24 hours  Patient callback is [phone]

## 2024-06-06 NOTE — Telephone Encounter (Signed)
 Copied from CRM [id number]. Topic: Clinical - Medication Question >> Jun 06, 2024  8:37 AM Berneda FALCON wrote: Reason for CRM: Pt states her great grandson is 27 months old and stayed with her, tested positive for COVID. She now has a sore throat and mild headache but tested herself at home and tested negative for COVID. Wants to know if we can call her in something for the sore throat and mild headache please.  [id number] [place], [place] - [address] AT [address] [place] [address] Phone: [phone] Fax: [phone] Hours: Not open 24 hours  Patient callback is [phone] >> Jun 06, 2024  5:53 PM Drema MATSU wrote: Patient is calling to check on request. She states that she never recieved a callback. >> Jun 06, 2024 10:15 AM Viola F wrote: Patient called to follow up on this

## 2024-06-07 ENCOUNTER — Ambulatory Visit: Payer: Medicare (Managed Care) | Admitting: Sports Medicine

## 2024-06-07 ENCOUNTER — Telehealth: Payer: Medicare (Managed Care) | Admitting: Internal Medicine

## 2024-06-07 ENCOUNTER — Ambulatory Visit: Payer: Self-pay

## 2024-06-07 ENCOUNTER — Encounter: Payer: Self-pay | Admitting: Internal Medicine

## 2024-06-07 DIAGNOSIS — E559 Vitamin D deficiency, unspecified: Secondary | ICD-10-CM

## 2024-06-07 DIAGNOSIS — N1831 Chronic kidney disease, stage 3a: Secondary | ICD-10-CM | POA: Diagnosis not present

## 2024-06-07 DIAGNOSIS — U071 COVID-19: Secondary | ICD-10-CM

## 2024-06-07 MED ORDER — HYDROCODONE BIT-HOMATROP MBR 5-1.5 MG/5ML PO SOLN: 5.0000 mL | Freq: Four times a day (QID) | ORAL | 0 refills | Status: AC | PRN

## 2024-06-07 MED ORDER — NIRMATRELVIR/RITONAVIR (PAXLOVID) TABLET (RENAL DOSING): 2.0000 | ORAL_TABLET | Freq: Two times a day (BID) | ORAL | 0 refills | Status: AC

## 2024-06-07 NOTE — Assessment & Plan Note (Signed)
Mild to mod, for antibx course paxlovid renal dosed, cough med prn,  to f/u any worsening symptoms or concerns

## 2024-06-07 NOTE — Progress Notes (Signed)
 Patient ID: Laura Poole, female   DOB: [birth year], 73 y.o.   MRN: [id number]  Virtual Visit via Video Note  I connected with Laura Poole on 06/07/24 at  9:40 AM EDT by a video enabled telemedicine application and verified that I am speaking with the correct person using two identifiers.  Location of all participants today Patient: at home Provider: at office   I discussed the limitations of evaluation and management by telemedicine and the availability of in person appointments. The patient expressed understanding and agreed to proceed.  History of Present Illness: Here with 3 days onset symptoms of fatigue, congested cough off white sputum, ear fullness, HA, myalgias and ST.  COVID test neg at home last evening, but repeat this AM is Positive.  Grandson ill with same just prior to her, but he is already doing well   Pt denies polydipsia, polyuria, or new focal neuro s/s.   Pt denies chest pain, increased sob or doe, wheezing, orthopnea, PND, increased LE swelling, palpitations, dizziness or syncope. Past Medical History:  Diagnosis Date   Aortic stenosis    mild to moderate on echo 02/2021 with no AS on echo 02/2022 and none by echo 02/28/2023   Asymmetric septal hypertrophy    Severe by echo but mild by cardiac MRI in 2018 with no evidence of HOCM   Bilateral leg cramps 09/01/2015   Breast cancer (HCC)    left   Essential hypertension 01/07/2010   Qualifier: Diagnosis of  By: Tish MD, Elsie     GERD (gastroesophageal reflux disease)    H/O: hysterectomy    History of breast cancer 2006   History of colonic polyps    HLD (hyperlipidemia)    Mitral regurgitation 08/05/2015   Moderate by echo 02/2024   Personal history of chemotherapy 2006   Left Breast Cancer   Personal history of radiation therapy 2006   Left Breast Cancer   Snoring 03/30/2017   Past Surgical History:  Procedure Laterality Date   ABDOMINAL HYSTERECTOMY     BREAST LUMPECTOMY Left 2006   COLONOSCOPY W/  POLYPECTOMY  2004, 2009   Dr Rosalie   TUBAL LIGATION      reports that she has never smoked. She has never used smokeless tobacco. She reports current alcohol use of about 2.0 standard drinks of alcohol per week. She reports that she does not use drugs. family history includes Aneurysm in her brother; Asthma in an other family member; Breast cancer in her paternal grandmother; Coronary artery disease in her mother; Heart attack (age of onset: 46) in her father; Hyperlipidemia in an other family member; Hypertension in her mother; Sudden death in an other family member. No Known Allergies Current Outpatient Medications on File Prior to Visit  Medication Sig Dispense Refill   acetaminophen  (TYLENOL ) 500 MG tablet Take 500 mg by mouth every 6 (six) hours as needed for mild pain or headache.     albuterol  (VENTOLIN  HFA) 108 (90 Base) MCG/ACT inhaler USE 2 INHALATIONS EVERY 6 HOURS AS NEEDED FOR WHEEZING OR SHORTNESS OF BREATH 18 g 5   amLODipine  (NORVASC ) 2.5 MG tablet TAKE 1 TABLET DAILY 180 tablet 3   aspirin  81 MG tablet Take 1 tablet (81 mg total) by mouth daily. 30 tablet 6   carvedilol  (COREG ) 12.5 MG tablet Take 1 tablet (12.5 mg total) by mouth 2 (two) times daily. 180 tablet 3   fluticasone -salmeterol (ADVAIR DISKUS) 250-50 MCG/ACT AEPB Inhale 1 puff into the lungs in the morning and  at bedtime. 3 each 3   gabapentin  (NEURONTIN ) 300 MG capsule Take 1 capsule (300 mg total) by mouth at bedtime. 270 capsule 1   HYDROcodone -acetaminophen  (NORCO/VICODIN) 5-325 MG tablet Take 1 tablet by mouth every 6 (six) hours as needed for moderate pain (pain score 4-6). 30 tablet 0   losartan  (COZAAR ) 100 MG tablet Take 1 tablet (100 mg total) by mouth daily. 90 tablet 3   meloxicam  (MOBIC ) 15 MG tablet Take 1 tablet (15 mg total) by mouth daily as needed for pain. 90 tablet 1   rosuvastatin  (CRESTOR ) 10 MG tablet Take 1 tablet (10 mg total) by mouth daily. 90 tablet 3   solifenacin  (VESICARE ) 5 MG tablet  TAKE 1 TABLET DAILY 90 tablet 3   spironolactone  (ALDACTONE ) 25 MG tablet TAKE 1 TABLET TWICE A DAY 180 tablet 3   tiZANidine  (ZANAFLEX ) 2 MG tablet TAKE 1 TABLET EVERY 6 HOURS AS NEEDED FOR MUSCLE SPASMS 360 tablet 3   triamcinolone  (NASACORT ) 55 MCG/ACT AERO nasal inhaler Place 2 sprays into the nose daily. 1 Inhaler 12   vitamin C (ASCORBIC ACID) 500 MG tablet Take 500 mg by mouth daily.     zolpidem  (AMBIEN ) 10 MG tablet Take 1 tablet (10 mg total) by mouth at bedtime as needed. for sleep 90 tablet 1   No current facility-administered medications on file prior to visit.   Observations/Objective: Alert, NAD, appropriate mood and affect, resps normal, cn 2-12 intact, moves all 4s, no visible rash or swelling Lab Results  Component Value Date   WBC 6.7 12/06/2023   HGB 12.4 12/06/2023   HCT 38.0 12/06/2023   PLT 309.0 12/06/2023   GLUCOSE 95 12/06/2023   CHOL 159 12/06/2023   TRIG 84.0 12/06/2023   HDL 56.60 12/06/2023   LDLCALC 85 12/06/2023   ALT 16 12/06/2023   AST 17 12/06/2023   NA 140 12/06/2023   K 4.3 12/06/2023   CL 104 12/06/2023   CREATININE 0.98 12/06/2023   BUN 20 12/06/2023   CO2 28 12/06/2023   TSH 2.05 12/06/2023   INR 1.1 (H) 01/25/2012   HGBA1C 6.5 12/06/2023   MICROALBUR <0.7 12/06/2023   Assessment and Plan: See notes  Follow Up Instructions: See notes   I discussed the assessment and treatment plan with the patient. The patient was provided an opportunity to ask questions and all were answered. The patient agreed with the plan and demonstrated an understanding of the instructions.   The patient was advised to call back or seek an in-person evaluation if the symptoms worsen or if the condition fails to improve as anticipated.  Lynwood Rush, MD

## 2024-06-07 NOTE — Telephone Encounter (Signed)
 Already addressed at televisit today  No new orders

## 2024-06-07 NOTE — Telephone Encounter (Signed)
 FYI Only or Action Required?: FYI only for provider.  Patient was last seen in primary care on 02/03/2024 by Norleen Lynwood ORN, MD.  Called Nurse Triage reporting Covid Exposure.  Symptoms began several days ago.  Interventions attempted: OTC medications: cough drops and Theraflu and Rest, hydration, or home remedies.  Symptoms are: unchanged.  Triage Disposition: Call PCP When Office is Open  Patient/caregiver understands and will follow disposition?: Yes   Copied from CRM #[id number]. Topic: Clinical - Red Word Triage >> Jun 07, 2024  8:05 AM Avram MATSU wrote: Red Word that prompted transfer to Nurse Triage: body aches, headache, coughing, Reason for Disposition  [1] COVID-19 infection suspected by caller or triager AND [2] mild symptoms (cough, fever, or others) AND [3] negative COVID-19 rapid test  Answer Assessment - Initial Assessment Questions 1. COVID-19 DIAGNOSIS: How do you know that you have COVID? (e.g., positive lab test or self-test, diagnosed by doctor or NP/PA, symptoms after exposure).     Positive home test today  2. COVID-19 EXPOSURE: Was there any known exposure to COVID before the symptoms began? CDC Definition of close contact: within 6 feet (2 meters) for a total of 15 minutes or more over a 24-hour period.      Grandson tested positive 3. ONSET: When did the COVID-19 symptoms start?      Tuesday 4. WORST SYMPTOM: What is your worst symptom? (e.g., cough, fever, shortness of breath, muscle aches)     Coughing and bodyache 5. COUGH: Do you have a cough? If Yes, ask: How bad is the cough?       Yes-bad 6. FEVER: Do you have a fever? If Yes, ask: What is your temperature, how was it measured, and when did it start?     no 7. RESPIRATORY STATUS: Describe your breathing? (e.g., normal; shortness of breath, wheezing, unable to speak)      no 8. BETTER-SAME-WORSE: Are you getting better, staying the same or getting worse compared to yesterday?  If  getting worse, ask, In what way?     same 9. OTHER SYMPTOMS: Do you have any other symptoms?  (e.g., chills, fatigue, headache, loss of smell or taste, muscle pain, sore throat)     Headache, sore throat 10. HIGH RISK DISEASE: Do you have any chronic medical problems? (e.g., asthma, heart or lung disease, weak immune system, obesity, etc.)       asthma 11. VACCINE: Have you had the COVID-19 vaccine? If Yes, ask: Which one, how many shots, when did you get it?       Yes had all the vaccinations 13. O2 SATURATION MONITOR:  Do you use an oxygen saturation monitor (pulse oximeter) at home? If Yes, ask What is your reading (oxygen level) today? What is your usual oxygen saturation reading? (e.g., 95%)       no  Protocols used: Coronavirus (COVID-19) Diagnosed or Suspected-A-AH

## 2024-06-07 NOTE — Patient Instructions (Signed)
 Please take all new medication as prescribed

## 2024-06-07 NOTE — Assessment & Plan Note (Signed)
 Lab Results  Component Value Date   CREATININE 0.98 12/06/2023   Stable overall, cont to avoid nephrotoxins

## 2024-06-07 NOTE — Assessment & Plan Note (Signed)
 Last vitamin D Lab Results  Component Value Date   VD25OH 33.41 12/06/2023   Low, to start oral replacement

## 2024-06-18 ENCOUNTER — Other Ambulatory Visit: Payer: Self-pay | Admitting: Internal Medicine

## 2024-06-19 NOTE — Progress Notes (Deleted)
 [facility] [address] [place] [address] Phone: [phone]   Assessment and Plan:         ***   Pertinent previous records reviewed include ***   Follow Up: ***     Subjective:   I, Laura Poole, am serving as a Neurosurgeon for Doctor Morene Mace   Chief Complaint: hip pain    HPI:    02/08/2024 Patient is a 73 year old female with hip pain. Patient states intermittent bilat hip pain that radiates down her leg. Has fallen 4 times since November. Her last fall she fell in her yard. And that one hurt the most. Dx arthritis when she saw Dr. Norleen. She has tried oxy tramadol , and meloxicam  she did not like oxy or tramadol .  Meloxicam  does not last long enough. No numbness or tingling. Pain when walking, decreased ROM. Going up and down steps is hard. She is not able to do her ALDs   02/29/2024 Patient states compared to the last time the pain is much better than what it was. There is still pain though. Been doing the exercises and the exercise bike. Can do a lot more things that she could not do before.    04/19/2024 Patient states she had a pain flare 04/11/2024. But, she took two aleve and that helped    05/17/2024 Patient states she was doing well until last week she over did it working in her yards.   06/20/2024 Patient states   Relevant Historical Information: History of breast cancer, GERD, hypertension,  Additional pertinent review of systems negative.   Current Outpatient Medications:    acetaminophen  (TYLENOL ) 500 MG tablet, Take 500 mg by mouth every 6 (six) hours as needed for mild pain or headache., Disp: , Rfl:    albuterol  (VENTOLIN  HFA) 108 (90 Base) MCG/ACT inhaler, USE 2 INHALATIONS EVERY 6 HOURS AS NEEDED FOR WHEEZING OR SHORTNESS OF BREATH, Disp: 18 g, Rfl: 5   amLODipine  (NORVASC ) 2.5 MG tablet, TAKE 1 TABLET DAILY, Disp: 180 tablet, Rfl: 3   aspirin  81 MG tablet, Take 1 tablet (81 mg total) by  mouth daily., Disp: 30 tablet, Rfl: 6   carvedilol  (COREG ) 12.5 MG tablet, Take 1 tablet (12.5 mg total) by mouth 2 (two) times daily., Disp: 180 tablet, Rfl: 3   fluticasone -salmeterol (ADVAIR DISKUS) 250-50 MCG/ACT AEPB, Inhale 1 puff into the lungs in the morning and at bedtime., Disp: 3 each, Rfl: 3   gabapentin  (NEURONTIN ) 300 MG capsule, Take 1 capsule (300 mg total) by mouth at bedtime., Disp: 270 capsule, Rfl: 1   HYDROcodone -acetaminophen  (NORCO/VICODIN) 5-325 MG tablet, Take 1 tablet by mouth every 6 (six) hours as needed for moderate pain (pain score 4-6)., Disp: 30 tablet, Rfl: 0   losartan  (COZAAR ) 100 MG tablet, Take 1 tablet (100 mg total) by mouth daily., Disp: 90 tablet, Rfl: 3   meloxicam  (MOBIC ) 15 MG tablet, Take 1 tablet (15 mg total) by mouth daily as needed for pain., Disp: 90 tablet, Rfl: 1   rosuvastatin  (CRESTOR ) 10 MG tablet, Take 1 tablet (10 mg total) by mouth daily., Disp: 90 tablet, Rfl: 3   solifenacin  (VESICARE ) 5 MG tablet, TAKE 1 TABLET DAILY, Disp: 90 tablet, Rfl: 3   spironolactone  (ALDACTONE ) 25 MG tablet, TAKE 1 TABLET TWICE A DAY, Disp: 180 tablet, Rfl: 3   tiZANidine  (ZANAFLEX ) 2 MG tablet, TAKE 1 TABLET EVERY 6 HOURS AS NEEDED FOR MUSCLE SPASMS, Disp: 360 tablet,  Rfl: 3   triamcinolone  (NASACORT ) 55 MCG/ACT AERO nasal inhaler, Place 2 sprays into the nose daily., Disp: 1 Inhaler, Rfl: 12   vitamin C (ASCORBIC ACID) 500 MG tablet, Take 500 mg by mouth daily., Disp: , Rfl:    zolpidem  (AMBIEN ) 10 MG tablet, Take 1 tablet (10 mg total) by mouth at bedtime as needed. for sleep, Disp: 90 tablet, Rfl: 1   Objective:     There were no vitals filed for this visit.    There is no height or weight on file to calculate BMI.    Physical Exam:    ***   Electronically signed by:  [name] D.[facility] 7:33 AM 06/19/24

## 2024-06-20 ENCOUNTER — Ambulatory Visit: Payer: Medicare (Managed Care) | Admitting: Sports Medicine

## 2024-06-25 NOTE — Progress Notes (Unsigned)
 Ben Jackson [name] [address] [place] [address] Phone: [phone]   Assessment and Plan:     1. Greater trochanteric bursitis of right hip (Primary) 2. Primary osteoarthritis of both hips 3. Bilateral hip pain -Chronic with exacerbation, subsequent visit - Resolved right lateral hip pain.  Most consistent with resolution of greater trochanteric bursitis.  Patient states she did not feel significant relief after greater trochanteric CSI on 05/17/2024, however she says that she was diagnosed with COVID on 06/06/2024 and pain resolved after that time.  I suspect greater trochanteric bursitis likely resolved with relative rest while sick with COVID as well as decreased symptoms with greater trochanteric CSI - Use Tylenol  500 to 1000 mg tablets 2-3 times a day for day-to-day pain relief - Use meloxicam  15 mg daily as needed for pain.  Recommend limiting chronic NSAIDs to 1-2 doses per week to prevent long-term side effects.    Pertinent previous records reviewed include none   Follow Up: As needed if no improvement or worsening of symptoms.  Could consider repeat greater trochanteric CSI versus intra-articular hip CSI versus evaluation for lumbar pathology with x-ray   Subjective:   I, Malaka Ruffner, am serving as a Neurosurgeon for Doctor Morene Mace   Chief Complaint: hip pain    HPI:    02/08/2024 Patient is a 73 year old female with hip pain. Patient states intermittent bilat hip pain that radiates down her leg. Has fallen 4 times since November. Her last fall she fell in her yard. And that one hurt the most. Dx arthritis when she saw Dr. Norleen. She has tried oxy tramadol , and meloxicam  she did not like oxy or tramadol .  Meloxicam  does not last long enough. No numbness or tingling. Pain when walking, decreased ROM. Going up and down steps is hard. She is not able to do her ALDs   02/29/2024 Patient states compared to the last time the  pain is much better than what it was. There is still pain though. Been doing the exercises and the exercise bike. Can do a lot more things that she could not do before.    04/19/2024 Patient states she had a pain flare 04/11/2024. But, she took two aleve and that helped    05/17/2024 Patient states she was doing well until last week she over did it working in her yards.   06/26/2024 Patient states she had covid recently and hasn't had any pain    Relevant Historical Information: History of breast cancer, GERD, hypertension,  Additional pertinent review of systems negative.   Current Outpatient Medications:    acetaminophen  (TYLENOL ) 500 MG tablet, Take 500 mg by mouth every 6 (six) hours as needed for mild pain or headache., Disp: , Rfl:    albuterol  (VENTOLIN  HFA) 108 (90 Base) MCG/ACT inhaler, USE 2 INHALATIONS EVERY 6 HOURS AS NEEDED FOR WHEEZING OR SHORTNESS OF BREATH, Disp: 18 g, Rfl: 5   amLODipine  (NORVASC ) 2.5 MG tablet, TAKE 1 TABLET DAILY, Disp: 180 tablet, Rfl: 3   aspirin  81 MG tablet, Take 1 tablet (81 mg total) by mouth daily., Disp: 30 tablet, Rfl: 6   carvedilol  (COREG ) 12.5 MG tablet, Take 1 tablet (12.5 mg total) by mouth 2 (two) times daily., Disp: 180 tablet, Rfl: 3   fluticasone -salmeterol (ADVAIR DISKUS) 250-50 MCG/ACT AEPB, Inhale 1 puff into the lungs in the morning and at bedtime., Disp: 3 each, Rfl: 3   gabapentin  (NEURONTIN ) 300 MG capsule, Take  1 capsule (300 mg total) by mouth at bedtime., Disp: 270 capsule, Rfl: 1   HYDROcodone -acetaminophen  (NORCO/VICODIN) 5-325 MG tablet, Take 1 tablet by mouth every 6 (six) hours as needed for moderate pain (pain score 4-6)., Disp: 30 tablet, Rfl: 0   losartan  (COZAAR ) 100 MG tablet, Take 1 tablet (100 mg total) by mouth daily., Disp: 90 tablet, Rfl: 3   meloxicam  (MOBIC ) 15 MG tablet, Take 1 tablet (15 mg total) by mouth daily as needed for pain., Disp: 90 tablet, Rfl: 1   rosuvastatin  (CRESTOR ) 10 MG tablet, Take 1 tablet (10  mg total) by mouth daily., Disp: 90 tablet, Rfl: 3   solifenacin  (VESICARE ) 5 MG tablet, TAKE 1 TABLET DAILY, Disp: 90 tablet, Rfl: 3   spironolactone  (ALDACTONE ) 25 MG tablet, TAKE 1 TABLET TWICE A DAY, Disp: 180 tablet, Rfl: 3   tiZANidine  (ZANAFLEX ) 2 MG tablet, TAKE 1 TABLET EVERY 6 HOURS AS NEEDED FOR MUSCLE SPASMS, Disp: 360 tablet, Rfl: 3   triamcinolone  (NASACORT ) 55 MCG/ACT AERO nasal inhaler, Place 2 sprays into the nose daily., Disp: 1 Inhaler, Rfl: 12   vitamin C (ASCORBIC ACID) 500 MG tablet, Take 500 mg by mouth daily., Disp: , Rfl:    zolpidem  (AMBIEN ) 10 MG tablet, Take 1 tablet (10 mg total) by mouth at bedtime as needed. for sleep, Disp: 90 tablet, Rfl: 1   Objective:     Vitals:   06/26/24 1449  Pulse: 92  SpO2: 97%  Weight: 190 lb (86.2 kg)  Height: 5' 7 (1.702 m)      Body mass index is 29.76 kg/m.    Physical Exam:    General: awake, alert, and oriented no acute distress, nontoxic Skin: no suspicious lesions or rashes Neuro:sensation intact distally with no deficits, normal muscle tone, no atrophy, strength 5/5 in all tested lower ext groups Psych: normal mood and affect, speech clear   Right hip: No deformity, swelling or wasting ROM Flexion 90, ext 30, IR 45, ER 45 NTTP over the hip flexors, greater trochanter, gluteal musculature, si joint, lumbar spine Negative log roll with FROM Negative FABER Negative FADIR Negative Piriformis test Negative trendelenberg Gait normal    Electronically signed by:  [name] D[name] 3:03 PM 06/26/24

## 2024-06-26 ENCOUNTER — Ambulatory Visit: Payer: Medicare (Managed Care) | Admitting: Sports Medicine

## 2024-06-26 ENCOUNTER — Other Ambulatory Visit: Payer: Self-pay | Admitting: Cardiology

## 2024-06-26 ENCOUNTER — Other Ambulatory Visit: Payer: Self-pay | Admitting: Internal Medicine

## 2024-06-26 VITALS — HR 92 | Ht 67.0 in | Wt 190.0 lb

## 2024-06-26 DIAGNOSIS — M160 Bilateral primary osteoarthritis of hip: Secondary | ICD-10-CM

## 2024-06-26 DIAGNOSIS — M25551 Pain in right hip: Secondary | ICD-10-CM | POA: Diagnosis not present

## 2024-06-26 DIAGNOSIS — M7061 Trochanteric bursitis, right hip: Secondary | ICD-10-CM | POA: Diagnosis not present

## 2024-06-26 DIAGNOSIS — M25552 Pain in left hip: Secondary | ICD-10-CM | POA: Diagnosis not present

## 2024-06-26 NOTE — Telephone Encounter (Signed)
 Pt has not been seen by a cardiologist in 2 years but has been seen by the pharmD. Does pt need to be seen by Dr. Shlomo first before getting another 90 day supply of medication? Please address

## 2024-07-23 ENCOUNTER — Other Ambulatory Visit: Payer: Self-pay | Admitting: Cardiology

## 2024-07-30 ENCOUNTER — Other Ambulatory Visit: Payer: Self-pay | Admitting: Hematology and Oncology

## 2024-07-30 DIAGNOSIS — C50912 Malignant neoplasm of unspecified site of left female breast: Secondary | ICD-10-CM

## 2024-08-06 ENCOUNTER — Inpatient Hospital Stay: Payer: Medicare (Managed Care) | Attending: Hematology and Oncology | Admitting: Genetic Counselor

## 2024-08-06 ENCOUNTER — Encounter: Payer: Self-pay | Admitting: Genetic Counselor

## 2024-08-06 ENCOUNTER — Inpatient Hospital Stay: Payer: Medicare (Managed Care)

## 2024-08-06 DIAGNOSIS — Z8049 Family history of malignant neoplasm of other genital organs: Secondary | ICD-10-CM

## 2024-08-06 DIAGNOSIS — Z853 Personal history of malignant neoplasm of breast: Secondary | ICD-10-CM

## 2024-08-06 DIAGNOSIS — Z803 Family history of malignant neoplasm of breast: Secondary | ICD-10-CM | POA: Insufficient documentation

## 2024-08-06 DIAGNOSIS — Z1379 Encounter for other screening for genetic and chromosomal anomalies: Secondary | ICD-10-CM | POA: Insufficient documentation

## 2024-08-06 LAB — GENETIC SCREENING ORDER

## 2024-08-06 NOTE — Progress Notes (Signed)
 REFERRING PROVIDER: Odean Potts, MD [address] [place],  [place] [address]  PRIMARY PROVIDER:  Norleen Lynwood ORN, MD  PRIMARY REASON FOR VISIT:  1. BREAST CANCER, HX OF   2. Family history of malignant neoplasm of breast   3. Family history of malignant neoplasm of genital organ     HISTORY OF PRESENT ILLNESS:   Ms. Palecek, a 73 y.o. female, was seen for a [place] cancer genetics consultation at the request of Odean Potts, MD due to a personal and family history of cancer.  Ms. Campillo presents to clinic today to discuss the possibility of a hereditary predisposition to cancer, to discuss genetic testing, and to further clarify her future cancer risks, as well as potential cancer risks for family members.   Around 2003, at the age of 79, Ms. Harnack was diagnosed with left breast cancer, treated with lumpectomy, four rounds of chemotherapy and radiation.   RELEVANT MEDICAL HISTORY:  Menarche was at age 45.  First live birth at age 38.  Ovaries intact: yes.  Uterus intact: no.  Menopausal status: postmenopausal. Age 22.  HRT use: 0 years. Colonoscopy: yes; reports history of one small polyp. Mammogram within the last year: yes. Number of breast biopsies: 2.  Past Medical History:  Diagnosis Date   Aortic stenosis    mild to moderate on echo 02/2021 with no AS on echo 02/2022 and none by echo 02/28/2023   Asymmetric septal hypertrophy    Severe by echo but mild by cardiac MRI in 2018 with no evidence of HOCM   Bilateral leg cramps 09/01/2015   Breast cancer (HCC)    left   Essential hypertension 01/07/2010   Qualifier: Diagnosis of  By: Tish MD, Elsie     GERD (gastroesophageal reflux disease)    H/O: hysterectomy    History of breast cancer 2006   History of colonic polyps    HLD (hyperlipidemia)    Mitral regurgitation 08/05/2015   Moderate by echo 02/2024   Personal history of chemotherapy 2006   Left Breast Cancer   Personal history of radiation  therapy 2006   Left Breast Cancer   Snoring 03/30/2017    Past Surgical History:  Procedure Laterality Date   ABDOMINAL HYSTERECTOMY     BREAST LUMPECTOMY Left 2006   COLONOSCOPY W/ POLYPECTOMY  2004, 2009   Dr Rosalie   TUBAL LIGATION      Social History   Socioeconomic History   Marital status: Single    Spouse name: Not on file   Number of children: Not on file   Years of education: 12   Highest education level: High school graduate  Occupational History   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  Tobacco Use   Smoking status: Never   Smokeless tobacco: Never  Vaping Use   Vaping status: Never Used  Substance and Sexual Activity   Alcohol use: Yes    Alcohol/week: 2.0 standard drinks of alcohol    Types: 2 Cans of beer per week    Comment: rarely   Drug use: No   Sexual activity: Not on file  Other Topics Concern   Not on file  Social History Narrative   Patient's grand daughter stays with her/2025   Social Drivers of Health   Financial Resource Strain: Low Risk  (03/15/2023)   Overall Financial Resource Strain (CARDIA)    Difficulty of Paying Living Expenses: Not hard at all  Food Insecurity: No Food Insecurity (03/15/2023)  Hunger Vital Sign    Worried About Running Out of Food in the Last Year: Never true    Ran Out of Food in the Last Year: Never true  Transportation Needs: No Transportation Needs (03/15/2023)   PRAPARE - Administrator, Civil Service (Medical): No    Lack of Transportation (Non-Medical): No  Physical Activity: Insufficiently Active (04/02/2024)   Exercise Vital Sign    Days of Exercise per Week: 6 days    Minutes of Exercise per Session: 20 min  Stress: No Stress Concern Present (04/02/2024)   [employer] of Occupational Health - Occupational Stress Questionnaire    Feeling of Stress: Not at all  Social Connections: Moderately Integrated (04/02/2024)   Social Connection and Isolation Panel    Frequency of  Communication with Friends and Family: More than three times a week    Frequency of Social Gatherings with Friends and Family: Twice a week    Attends Religious Services: More than 4 times per year    Active Member of [employer] or Organizations: Yes    Attends Banker Meetings: 1 to 4 times per year    Marital Status: Never married     FAMILY HISTORY:  We obtained a detailed, 4-generation family history.  Significant diagnoses are listed below: Family History  Problem Relation Age of Onset   Hypertension Mother    Coronary artery disease Mother    Heart attack Father 16   Aneurysm Brother    Uterine cancer Maternal Aunt    Breast cancer Paternal Grandmother    Asthma Nephew        nephew   Aneurysm Nephew    Hyperlipidemia Other        nephew   Sudden death Other        nephew   Breast cancer Daughter 64   Breast cancer Maternal Cousin        positive genetics?   Colon cancer Neg Hx     Ms. Dickie reports a maternal first cousin and maternal first cousin once removed that may have had positive genetic testing and utilized to have bilateral mastectomies, but details are unknown. There is no reported Ashkenazi Jewish ancestry.     GENETIC COUNSELING ASSESSMENT: Ms. Wesch is a 73 y.o. female with a personal and family history of cancer which is somewhat suggestive of a hereditary predisposition to cancer given her history of breast cancer, reported at 54 and her daughter's history of breast cancer at  37. We, therefore, discussed and recommended the following at today's visit.   DISCUSSION: We discussed that 5 - 10% of cancer is hereditary, with most cases of breast associated with pathogenic variants in BRCA1/2.  There are other genes that can be associated with hereditary breast cancer syndromes.  We discussed that testing is beneficial for several reasons including knowing how to follow individuals after completing their treatment, identifying whether potential treatment  options would be beneficial, and understanding if other family members could be at risk for cancer and allowing them to undergo genetic testing.   We reviewed the characteristics, features and inheritance patterns of hereditary cancer syndromes. We also discussed genetic testing, including the appropriate family members to test, the process of testing, insurance coverage and turn-around-time for results. We discussed the implications of a negative, positive, carrier and/or variant of uncertain significant result. We recommended Ms. Burmester pursue genetic testing for a panel that includes genes associated with breast cancer.   Ms. Garside  was  offered a common hereditary cancer panel (40+ genes) and an expanded pan-cancer panel (70+ genes). Ms. Bouska was informed of the benefits and limitations of each panel, including that expanded pan-cancer panels contain genes that do not have clear management guidelines at this point in time.  We also discussed that as the number of genes included on a panel increases, the chances of variants of uncertain significance increases. After considering the benefits and limitations of each gene panel, Ms. Ishaq elected to have [facility] Common Hereditary Cancers panel +RNA.  Based on Ms. Genson's personal and family history of cancer, she meets medical criteria for genetic testing. Despite that she meets criteria, she may still have an out of pocket cost. We discussed that if her out of pocket cost for testing is over $100, the laboratory should contact them to discuss self-pay prices, patient pay assistance programs, if applicable, and other billing options.  We discussed that some people do not want to undergo genetic testing due to fear of genetic discrimination.  A federal law called the Genetic Information Non-Discrimination Act (GINA) of 2008 helps protect individuals against genetic discrimination based on their genetic test results.  It impacts both health insurance and  employment.  With health insurance, it protects against increased premiums, being kicked off insurance or being forced to take a test in order to be insured.  For employment it protects against hiring, firing and promoting decisions based on genetic test results.  GINA does not apply to those in the [employer], those who work for companies with less than 15 employees, and new life insurance or long-term disability insurance policies.  Health status due to a cancer diagnosis is not protected under GINA.  PLAN: After considering the risks, benefits, and limitations, Ms. Roes provided informed consent to pursue genetic testing and the blood sample was sent to [facility] for analysis of the Common Hereditary  Cancers panel +RNA. Results should be available within approximately 2-3 weeks' time, at which point they will be disclosed by telephone to Ms. Vanderloop, as will any additional recommendations warranted by these results. Ms. Sowder will receive a summary of her genetic counseling visit and a copy of her results once available. This information will also be available in Epic.   Lastly, we encouraged Ms. Abdulla to remain in contact with cancer genetics annually so that we can continuously update the family history and inform her of any changes in cancer genetics and testing that may be of benefit for this family.   Ms. Brunke questions were answered to her satisfaction today. Our contact information was provided should additional questions or concerns arise. Thank you for the referral and allowing us  to share in the care of your patient.   Burnard Ogren, MS, [facility] Licensed, [email or web address] phone: [phone]   40 minutes were spent on the date of the encounter in service to the patient including preparation, face-to-face consultation, documentation and care coordination.  The patient was seen alone.  Drs. Gudena and/or Lanny were available to discuss this case as  needed.   _______________________________________________________________________ For Office Staff:  Number of people involved in session: 1 Was an Intern/ student involved with case: no

## 2024-08-15 ENCOUNTER — Telehealth: Payer: Self-pay | Admitting: Genetic Counselor

## 2024-08-15 ENCOUNTER — Ambulatory Visit: Payer: Self-pay | Admitting: Genetic Counselor

## 2024-08-15 DIAGNOSIS — Z1379 Encounter for other screening for genetic and chromosomal anomalies: Secondary | ICD-10-CM

## 2024-08-15 NOTE — Telephone Encounter (Signed)
 I contacted  Laura Poole to discuss her genetic testing results. No pathogenic variants were identified in the 48 genes analyzed. Discussed that we do not know why she had cancer or why there is cancer in the family. It could be due to a different gene that we are not testing, or maybe our current technology may not be able to pick something up.  It will be important for her to keep in contact with genetics to keep up with whether additional testing may be needed. Detailed clinic note to follow.   The test report will be scanned into EPIC and will be located under the Molecular Pathology section of the Results Review tab.  A portion of the result report is included below for reference.

## 2024-08-16 NOTE — Progress Notes (Signed)
 HPI:  Laura Poole was previously seen in the [facility] due to a personal and family history of cancer and concerns regarding a hereditary predisposition to cancer. Please refer to our prior cancer genetics clinic note for more information regarding our discussion, assessment and recommendations, at the time. Ms. Gassert recent genetic test results were disclosed to her, as were recommendations warranted by these results. These results and recommendations are discussed in more detail below.  Results were disclosed by telephone on 08/15/24.   Around 2003, at the age of 73, Laura Poole was diagnosed with left breast cancer, treated with lumpectomy, four rounds of chemotherapy and radiation.    FAMILY HISTORY:  We obtained a detailed, 4-generation family history.  Significant diagnoses are listed below: Family History  Problem Relation Age of Onset   Hypertension Mother    Coronary artery disease Mother    Heart attack Father 42   Aneurysm Brother    Uterine cancer Maternal Aunt    Breast cancer Paternal Grandmother    Asthma Nephew        nephew   Aneurysm Nephew    Hyperlipidemia Other        nephew   Sudden death Other        nephew   Breast cancer Daughter 41   Breast cancer Maternal Cousin        positive genetics?   Colon cancer Neg Hx     Ms. Brisbin reports a maternal first cousin and maternal first cousin once removed that may have had positive genetic testing and utilized to have bilateral mastectomies, but details are unknown. There is no reported Ashkenazi Jewish ancestry.      GENETIC TEST RESULTS: Genetic testing reported out on 08/12/24 through the [facility] Common Hereditary Cancers +RNA panel found no pathogenic mutations. The [facility] Common Hereditary Cancers panel includes analysis of the following 48 genes: APC, ATM, AXIN2, BAP1, BARD1, BMPR1A, BRCA1, BRCA2, BRIP1, CDH1, CDK4, CDKN2A, CHEK2, CTNNA1, DICER1, EPCAM, FH, GREM1, HOXB13, KIT, MBD4, MEN1,  MLH1, MSH2, MSH3, MSH6, MUTYH, NF1, NTHL1, PALB2, PDGFRA, PMS2, POLD1, POLE, PTEN, RAD51C, RAD51D, SDHA, SDHB, SDHC, SDHD, SMAD4, SMARCA4, STK11, TP53, TSC1, TSC2, VHL. The test report has been scanned into EPIC and is located under the Molecular Pathology section of the Results Review tab.  A portion of the result report is included below for reference.     We discussed with Ms. Forinash that because current genetic testing is not perfect, it is possible there may be a gene mutation in one of these genes that current testing cannot detect, but that chance is small.  We also discussed, that there could be another gene that has not yet been discovered, or that we have not yet tested, that is responsible for the cancer diagnoses in the family. It is also possible there is a hereditary cause for the cancer in the family that Ms. Gulas did not inherit and therefore was not identified in her testing.  Therefore, it is important to remain in touch with cancer genetics in the future so that we can continue to offer Ms. Solum the most up to date genetic testing.   ADDITIONAL GENETIC TESTING: We discussed with Ms. Wedel that there are other genes that are associated with increased cancer risk that can be analyzed. Should Ms. Lehnen wish to pursue additional genetic testing, we are happy to discuss and coordinate this testing, at any time.     CANCER SCREENING RECOMMENDATIONS: Ms. Skillman test result  is considered negative (normal).  This means that we have not identified a hereditary cause for her personal and family history of cancer at this time. Most cancers happen by chance and this negative test suggests that her personal and family history of cancer may fall into this category.    Possible reasons for Ms. Clair's negative genetic test include:  1. There may be a gene mutation in one of these genes that current testing methods cannot detect. The likelihood of this is low, but possible.   2. There could be  another gene that has not yet been discovered, or that we have not yet tested, that is responsible for the cancer diagnoses in the family.  3.  There may be no hereditary risk for cancer in the family. The cancers in Ms. Hippert and/or her family may be sporadic/familial or due to other genetic and environmental factors. 4. It is also possible there is a hereditary cause for the cancer in the family that Ms. Woehrle did not inherit.  Therefore, it is recommended she continue to follow the cancer management and screening guidelines provided by her oncology and primary healthcare provider. An individual's cancer risk and medical management are not determined by genetic test results alone. Overall cancer risk assessment incorporates additional factors, including personal medical history, family history, and any available genetic information that may result in a personalized plan for cancer prevention and surveillance  Given Ms. Vecchio's personal and family histories, we must interpret these negative results with some caution.  Families with features suggestive of hereditary risk for cancer tend to have multiple family members with cancer, diagnoses in multiple generations and diagnoses before the age of 69. Ms. Jeppsen's family exhibits some of these features. Thus, this result may simply reflect our current inability to detect all mutations within these genes or there may be a different gene that has not yet been discovered or tested.   We encourage Ms. Clyne to contact us  if she is able to learn any additional information about genetic testing previously completed for family members.   RECOMMENDATIONS FOR FAMILY MEMBERS:  Individuals in this family might be at some increased risk of developing cancer, over the general population risk, simply due to the family history of cancer.  We recommended women in this family have a yearly mammogram beginning at age 36, or 66 years younger than the earliest onset of cancer,  an annual clinical breast exam, and perform monthly breast self-exams. Women in this family should also have a gynecological exam as recommended by their primary provider. All family members should be referred for colonoscopy starting at age 5, or 47 years younger than the earliest onset of cancer.  Other relatives may benefit from completing their own genetic testing, especially if they have been diagnosed with cancer.   FOLLOW-UP: Lastly, we discussed with Ms. Mccammon that cancer genetics is a rapidly advancing field and it is possible that new genetic tests will be appropriate for her and/or her family members in the future. We encouraged her to remain in contact with cancer genetics on an annual basis so we can update her personal and family histories and let her know of advances in cancer genetics that may benefit this family.   Our contact number was provided. Ms. Hietala's questions were answered to her satisfaction, and she knows she is welcome to call us  at anytime with additional questions or concerns.   Burnard Ogren, MS, [facility] Licensed, [email or web address] [phone]

## 2024-09-10 ENCOUNTER — Telehealth: Payer: Self-pay

## 2024-09-10 NOTE — Telephone Encounter (Signed)
 Copied from CRM #[id number]. Topic: Clinical - Medication Question >> Sep 10, 2024  8:21 AM Revonda D wrote: Reason for CRM: Pt stated that she is experiencing a cough and head cold and is requesting a medication from Dr.John. Pt would appreciate a callback with an update.

## 2024-09-10 NOTE — Telephone Encounter (Signed)
 Copied from CRM #[id number]. Topic: Clinical - Medication Question >> Sep 10, 2024  8:21 AM Revonda D wrote: Reason for CRM: Pt stated that she is experiencing a cough and head cold and is requesting a medication from Dr.John. Pt would appreciate a callback with an update. >> Sep 10, 2024  2:25 PM Thersia C wrote: Patient called in regarding medication and the status would like a callback

## 2024-09-10 NOTE — Telephone Encounter (Signed)
Needs rov please

## 2024-09-11 ENCOUNTER — Telehealth: Payer: Medicare (Managed Care) | Admitting: Internal Medicine

## 2024-09-11 ENCOUNTER — Encounter: Payer: Self-pay | Admitting: Internal Medicine

## 2024-09-11 DIAGNOSIS — R739 Hyperglycemia, unspecified: Secondary | ICD-10-CM | POA: Diagnosis not present

## 2024-09-11 DIAGNOSIS — R058 Other specified cough: Secondary | ICD-10-CM | POA: Diagnosis not present

## 2024-09-11 DIAGNOSIS — E559 Vitamin D deficiency, unspecified: Secondary | ICD-10-CM

## 2024-09-11 MED ORDER — AZITHROMYCIN 250 MG PO TABS: ORAL_TABLET | ORAL | 1 refills | Status: AC

## 2024-09-11 MED ORDER — HYDROCODONE BIT-HOMATROP MBR 5-1.5 MG/5ML PO SOLN: 5.0000 mL | Freq: Four times a day (QID) | ORAL | 0 refills | Status: AC | PRN

## 2024-09-11 NOTE — Assessment & Plan Note (Signed)
 Mild to mod, can't r/o pna, for antibx course zpack, cough med prn, declines cxr,  to f/u any worsening symptoms or concerns

## 2024-09-11 NOTE — Assessment & Plan Note (Signed)
 Last vitamin D  Lab Results  Component Value Date   VD25OH 33.41 12/06/2023   Low, reminded to start oral replacement

## 2024-09-11 NOTE — Assessment & Plan Note (Signed)
 Lab Results  Component Value Date   HGBA1C 6.5 12/06/2023   Stable, pt to continue current medical treatment  - diet, wt control

## 2024-09-11 NOTE — Patient Instructions (Signed)
 Please take all new medication as prescribed

## 2024-09-11 NOTE — Progress Notes (Signed)
 Patient ID: Laura Poole, female   DOB: 06-26-1951, 73 y.o.   MRN: [id number]  Virtual Visit via Video Note  I connected with Laura Poole on 09/11/24 at  4:00 PM EST by a video enabled telemedicine application and verified that I am speaking with the correct person using two identifiers.  Location of all participants today Patient: at home Provider: at office   I discussed the limitations of evaluation and management by telemedicine and the availability of in person appointments. The patient expressed understanding and agreed to proceed.  History of Present Illness: Here with acute onset mild to mod 2-3 days ST, HA, general weakness and malaise, with prod cough greenish sputum, but Pt denies chest pain, increased sob or doe, wheezing, orthopnea, PND, increased LE swelling, palpitations, dizziness or syncope.   Pt denies polydipsia, polyuria, or new focal neuro s/s.   Past Medical History:  Diagnosis Date   Aortic stenosis    mild to moderate on echo 02/2021 with no AS on echo 02/2022 and none by echo 02/28/2023   Asymmetric septal hypertrophy    Severe by echo but mild by cardiac MRI in 2018 with no evidence of HOCM   Bilateral leg cramps 09/01/2015   Breast cancer (HCC)    left   Essential hypertension 01/07/2010   Qualifier: Diagnosis of  By: Tish MD, Elsie     GERD (gastroesophageal reflux disease)    H/O: hysterectomy    History of breast cancer 2006   History of colonic polyps    HLD (hyperlipidemia)    Mitral regurgitation 08/05/2015   Moderate by echo 02/2024   Personal history of chemotherapy 2006   Left Breast Cancer   Personal history of radiation therapy 2006   Left Breast Cancer   Snoring 03/30/2017   Past Surgical History:  Procedure Laterality Date   ABDOMINAL HYSTERECTOMY     BREAST LUMPECTOMY Left 2006   COLONOSCOPY W/ POLYPECTOMY  2004, 2009   Dr Rosalie   TUBAL LIGATION      reports that she has never smoked. She has never used smokeless tobacco.  She reports current alcohol use of about 2.0 standard drinks of alcohol per week. She reports that she does not use drugs. family history includes Aneurysm in her brother and nephew; Asthma in her nephew; Breast cancer in her maternal cousin and paternal grandmother; Breast cancer (age of onset: 21) in her daughter; Coronary artery disease in her mother; Heart attack (age of onset: 14) in her father; Hyperlipidemia in an other family member; Hypertension in her mother; Sudden death in an other family member; Uterine cancer in her maternal aunt. No Known Allergies Current Outpatient Medications on File Prior to Visit  Medication Sig Dispense Refill   acetaminophen  (TYLENOL ) 500 MG tablet Take 500 mg by mouth every 6 (six) hours as needed for mild pain or headache.     albuterol  (VENTOLIN  HFA) 108 (90 Base) MCG/ACT inhaler USE 2 INHALATIONS EVERY 6 HOURS AS NEEDED FOR WHEEZING OR SHORTNESS OF BREATH 18 g 5   amLODipine  (NORVASC ) 2.5 MG tablet TAKE 1 TABLET DAILY 30 tablet 0   aspirin  81 MG tablet Take 1 tablet (81 mg total) by mouth daily. 30 tablet 6   carvedilol  (COREG ) 12.5 MG tablet TAKE 1 TABLET TWICE A DAY 60 tablet 0   fluticasone -salmeterol (ADVAIR) 250-50 MCG/ACT AEPB USE 1 INHALATION IN THE MORNING AND AT BEDTIME 180 each 3   gabapentin  (NEURONTIN ) 300 MG capsule Take 1 capsule (300 mg total) by  mouth at bedtime. 270 capsule 1   HYDROcodone -acetaminophen  (NORCO/VICODIN) 5-325 MG tablet Take 1 tablet by mouth every 6 (six) hours as needed for moderate pain (pain score 4-6). 30 tablet 0   losartan  (COZAAR ) 100 MG tablet Take 1 tablet (100 mg total) by mouth daily. 90 tablet 3   meloxicam  (MOBIC ) 15 MG tablet Take 1 tablet (15 mg total) by mouth daily as needed for pain. 90 tablet 1   rosuvastatin  (CRESTOR ) 10 MG tablet Take 1 tablet (10 mg total) by mouth daily. 90 tablet 3   solifenacin  (VESICARE ) 5 MG tablet TAKE 1 TABLET DAILY 90 tablet 3   spironolactone  (ALDACTONE ) 25 MG tablet TAKE 1  TABLET TWICE A DAY 180 tablet 3   tiZANidine  (ZANAFLEX ) 2 MG tablet TAKE 1 TABLET EVERY 6 HOURS AS NEEDED FOR MUSCLE SPASMS 360 tablet 3   triamcinolone  (NASACORT ) 55 MCG/ACT AERO nasal inhaler Place 2 sprays into the nose daily. 1 Inhaler 12   vitamin C (ASCORBIC ACID) 500 MG tablet Take 500 mg by mouth daily.     zolpidem  (AMBIEN ) 10 MG tablet Take 1 tablet (10 mg total) by mouth at bedtime as needed. for sleep 90 tablet 1   No current facility-administered medications on file prior to visit.   Observations/Objective: Alert, NAD, appropriate mood and affect, resps normal, cn 2-12 intact, moves all 4s, no visible rash or swelling Lab Results  Component Value Date   WBC 6.7 12/06/2023   HGB 12.4 12/06/2023   HCT 38.0 12/06/2023   PLT 309.0 12/06/2023   GLUCOSE 95 12/06/2023   CHOL 159 12/06/2023   TRIG 84.0 12/06/2023   HDL 56.60 12/06/2023   LDLCALC 85 12/06/2023   ALT 16 12/06/2023   AST 17 12/06/2023   NA 140 12/06/2023   K 4.3 12/06/2023   CL 104 12/06/2023   CREATININE 0.98 12/06/2023   BUN 20 12/06/2023   CO2 28 12/06/2023   TSH 2.05 12/06/2023   INR 1.1 (H) 01/25/2012   HGBA1C 6.5 12/06/2023   MICROALBUR <0.7 12/06/2023   Assessment and Plan: See notes  Follow Up Instructions: See notes   I discussed the assessment and treatment plan with the patient. The patient was provided an opportunity to ask questions and all were answered. The patient agreed with the plan and demonstrated an understanding of the instructions.   The patient was advised to call back or seek an in-person evaluation if the symptoms worsen or if the condition fails to improve as anticipated.   Lynwood Rush, MD

## 2024-09-12 NOTE — Telephone Encounter (Signed)
 Pt had video visit on 09/11/24.

## 2024-09-15 ENCOUNTER — Other Ambulatory Visit: Payer: Self-pay | Admitting: Internal Medicine

## 2024-09-15 DIAGNOSIS — G4700 Insomnia, unspecified: Secondary | ICD-10-CM

## 2024-09-19 ENCOUNTER — Telehealth: Payer: Self-pay | Admitting: Internal Medicine

## 2024-09-19 NOTE — Telephone Encounter (Signed)
 Ambien  refill [facility] on dec [facility], so we shouldn't need another at this time, unless she prefers to have this filled at a local pharmacy  thanks

## 2024-09-19 NOTE — Telephone Encounter (Unsigned)
 Copied from CRM #[id number]. Topic: Clinical - Medication Refill >> Sep 19, 2024  2:35 PM Harlene ORN wrote: Medication: zolpidem  (AMBIEN ) 10 MG tablet  Has the patient contacted their pharmacy? Yes (Agent: If no, request that the patient contact the pharmacy for the refill. If patient does not wish to contact the pharmacy document the reason why and proceed with request.) (Agent: If yes, when and what did the pharmacy advise?)  This is the patient's preferred pharmacy:  [facility] - [place], [place] - [address] [place] [address] Phone: [phone] Fax: [phone]  Is this the correct pharmacy for this prescription? Yes If no, delete pharmacy and type the correct one.   Has the prescription been filled recently? No  Is the patient out of the medication? Yes  Has the patient been seen for an appointment in the last year OR does the patient have an upcoming appointment? Yes  Can we respond through MyChart? Yes  Agent: Please be advised that Rx refills may take up to 3 business days. We ask that you follow-up with your pharmacy.

## 2024-09-21 DIAGNOSIS — H43813 Vitreous degeneration, bilateral: Secondary | ICD-10-CM | POA: Diagnosis not present

## 2024-09-21 DIAGNOSIS — H35463 Secondary vitreoretinal degeneration, bilateral: Secondary | ICD-10-CM | POA: Diagnosis not present

## 2024-09-21 DIAGNOSIS — H35033 Hypertensive retinopathy, bilateral: Secondary | ICD-10-CM | POA: Diagnosis not present

## 2024-09-21 DIAGNOSIS — Z8669 Personal history of other diseases of the nervous system and sense organs: Secondary | ICD-10-CM | POA: Diagnosis not present

## 2024-09-21 DIAGNOSIS — H31092 Other chorioretinal scars, left eye: Secondary | ICD-10-CM | POA: Diagnosis not present

## 2024-09-21 DIAGNOSIS — H2513 Age-related nuclear cataract, bilateral: Secondary | ICD-10-CM | POA: Diagnosis not present

## 2024-10-29 ENCOUNTER — Ambulatory Visit: Payer: Medicare (Managed Care) | Admitting: Sports Medicine

## 2024-11-14 ENCOUNTER — Ambulatory Visit: Payer: Medicare (Managed Care) | Admitting: Sports Medicine

## 2024-11-14 VITALS — BP 134/82 | HR 87 | Ht 67.0 in | Wt 183.0 lb

## 2024-11-14 DIAGNOSIS — M7061 Trochanteric bursitis, right hip: Secondary | ICD-10-CM | POA: Diagnosis not present

## 2024-11-14 DIAGNOSIS — M25551 Pain in right hip: Secondary | ICD-10-CM

## 2024-11-14 DIAGNOSIS — M1611 Unilateral primary osteoarthritis, right hip: Secondary | ICD-10-CM

## 2024-11-14 NOTE — Progress Notes (Signed)
 "               Laura Poole D.[name] [address] [place] [address] Phone: [phone]   Assessment and Plan:     1. Pain of right hip (Primary) 2. Greater trochanteric bursitis of right hip 3. Primary osteoarthritis of right hip -Chronic with exacerbation, subsequent visit - Recurrence of right lateral hip pain.  Still most consistent with recurrent flare of greater trochanteric bursitis, likely due to side sleeping, prolonged sitting at work with an old chair.  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  Elected to defer repeat injection at today's visit - Use meloxicam  15 mg daily as needed for breakthrough pain.  Recommend limiting chronic NSAIDs to 1-2 doses per week to prevent long-term side effects. Use Tylenol  500 to 1000 mg tablets 2-3 times a day as needed for day-to-day pain relief.    - Continue HEP and start physical therapy focusing on hip, gluteal musculature - Use topical Voltaren  gel over areas of pain - Patient is scheduled to get a new chair at work which I do believe will be beneficial.  Recommend standing for 5 minutes every hour to decrease chronic pain    Pertinent previous records reviewed include none   Follow Up: 6 to 8 weeks for reevaluation.  Could consider repeat greater trochanteric CSI versus intra-articular hip CSI   Subjective:   I, Laura Poole, am serving as a neurosurgeon for Doctor Morene Poole  Chief Complaint: hip pain    HPI:    02/08/2024 Patient is a 74 year old female with hip pain. Patient states intermittent bilat hip pain that radiates down her leg. Has fallen 4 times since November. Her last fall she fell in her yard. And that one  hurt the most. Dx arthritis when she saw Dr. Norleen. She has tried oxy tramadol , and meloxicam  she did not like oxy or tramadol .  Meloxicam  does not last long enough. No numbness or tingling. Pain when walking, decreased ROM. Going up and down steps is hard. She is not able to do her ALDs   02/29/2024 Patient states compared to the last time the pain is much better than what it was. There is still pain though. Been doing the exercises and the exercise bike. Can do a lot more things that she could not do before.    04/19/2024 Patient states she had a pain flare 04/11/2024. But, she took two aleve and that helped    05/17/2024 Patient states she was doing well until last week she over did it working in her yards.    06/26/2024 Patient states she had covid recently and hasn't had any pain   11/14/2024 Patient states pain came back 2 weeks ago    Relevant Historical Information: History of breast cancer, GERD, hypertension,  Additional pertinent review of systems negative.  Current Medications[1]   Objective:     Vitals:   11/14/24 1023  BP: 134/82  Pulse: 87  SpO2: 100%  Weight: 183 lb (83 kg)  Height: 5' 7 (1.702 m)      Body mass index is 28.66 kg/m.    Physical Exam:    General: awake, alert, and oriented no acute distress, nontoxic Skin: no suspicious lesions or rashes Neuro:sensation  intact distally with no deficits, normal muscle tone, no atrophy, strength 5/5 in all tested lower ext groups Psych: normal mood and affect, speech clear   Right hip: No deformity, swelling or wasting ROM Flexion 90, ext 30, IR 45, ER 45 TTP greater trochanteric  NTTP over the hip flexors,  gluteal musculature, si joint, lumbar spine Negative log roll with FROM Negative FABER Negative FADIR Negative Piriformis test Negative trendelenberg Gait normal  Negative straight leg raise    Electronically signed by:  Laura Poole D.[name] 10:34 AM 11/14/24     [1]   Current Outpatient Medications:    acetaminophen  (TYLENOL ) 500 MG tablet, Take 500 mg by mouth every 6 (six) hours as needed for mild pain or headache., Disp: , Rfl:    albuterol  (VENTOLIN  HFA) 108 (90 Base) MCG/ACT inhaler, USE 2 INHALATIONS EVERY 6 HOURS AS NEEDED FOR WHEEZING OR SHORTNESS OF BREATH, Disp: 18 g, Rfl: 5   amLODipine  (NORVASC ) 2.5 MG tablet, TAKE 1 TABLET DAILY, Disp: 30 tablet, Rfl: 0   aspirin  81 MG tablet, Take 1 tablet (81 mg total) by mouth daily., Disp: 30 tablet, Rfl: 6   carvedilol  (COREG ) 12.5 MG tablet, TAKE 1 TABLET TWICE A DAY, Disp: 60 tablet, Rfl: 0   fluticasone -salmeterol (ADVAIR) 250-50 MCG/ACT AEPB, USE 1 INHALATION IN THE MORNING AND AT BEDTIME, Disp: 180 each, Rfl: 3   gabapentin  (NEURONTIN ) 300 MG capsule, Take 1 capsule (300 mg total) by mouth at bedtime., Disp: 270 capsule, Rfl: 1   HYDROcodone -acetaminophen  (NORCO/VICODIN) 5-325 MG tablet, Take 1 tablet by mouth every 6 (six) hours as needed for moderate pain (pain score 4-6)., Disp: 30 tablet, Rfl: 0   losartan  (COZAAR ) 100 MG tablet, Take 1 tablet (100 mg total) by mouth daily., Disp: 90 tablet, Rfl: 3   meloxicam  (MOBIC ) 15 MG tablet, Take 1 tablet (15 mg total) by mouth daily as needed for pain., Disp: 90 tablet, Rfl: 1   rosuvastatin  (CRESTOR ) 10 MG tablet, Take 1 tablet (10 mg total) by mouth daily., Disp: 90 tablet, Rfl: 3   solifenacin  (VESICARE ) 5 MG tablet, TAKE 1 TABLET DAILY, Disp: 90 tablet, Rfl: 3   spironolactone  (ALDACTONE ) 25 MG tablet, TAKE 1 TABLET TWICE A DAY, Disp: 180 tablet, Rfl: 3   tiZANidine  (ZANAFLEX ) 2 MG tablet, TAKE 1 TABLET EVERY 6 HOURS AS NEEDED FOR MUSCLE SPASMS, Disp: 360 tablet, Rfl: 3   triamcinolone  (NASACORT ) 55 MCG/ACT AERO nasal inhaler, Place 2 sprays into the nose daily., Disp: 1 Inhaler, Rfl: 12   vitamin C (ASCORBIC ACID) 500 MG tablet, Take 500 mg by mouth daily., Disp: , Rfl:    zolpidem  (AMBIEN ) 10 MG tablet, TAKE 1 TABLET AT BEDTIME AS NEEDED FOR SLEEP, Disp:  90 tablet, Rfl: 1  "

## 2024-11-14 NOTE — Patient Instructions (Signed)
 Tylenol  [phone] mg 2-3 times a day for pain relief   PT referral   Stand at least 5 minutes every hours  Call and let us  know if you would like a refill of meloxicam   6-8 week follow up

## 2025-01-08 ENCOUNTER — Encounter: Payer: Medicare (Managed Care) | Admitting: Family Medicine

## 2025-04-03 ENCOUNTER — Ambulatory Visit: Payer: Medicare (Managed Care)
